# Patient Record
Sex: Female | Born: 1955 | Race: Black or African American | Hispanic: No | Marital: Single | Smoking: Former smoker
Health system: Southern US, Community
[De-identification: ages and names within clinical notes are randomized; demographics above are authoritative.]

## PROBLEM LIST (undated history)

## (undated) ENCOUNTER — Emergency Department (HOSPITAL_COMMUNITY): Payer: Self-pay | Source: Home / Self Care

## (undated) DIAGNOSIS — I639 Cerebral infarction, unspecified: Secondary | ICD-10-CM

## (undated) DIAGNOSIS — E119 Type 2 diabetes mellitus without complications: Secondary | ICD-10-CM

## (undated) DIAGNOSIS — I10 Essential (primary) hypertension: Secondary | ICD-10-CM

## (undated) HISTORY — PX: ABDOMINAL HYSTERECTOMY: SHX81

## (undated) HISTORY — DX: Essential (primary) hypertension: I10

---

## 2010-06-26 ENCOUNTER — Emergency Department: Payer: Self-pay | Admitting: Emergency Medicine

## 2011-08-12 ENCOUNTER — Ambulatory Visit: Payer: Self-pay | Admitting: Family Medicine

## 2011-09-01 ENCOUNTER — Ambulatory Visit: Payer: Self-pay | Admitting: Family Medicine

## 2020-05-17 ENCOUNTER — Emergency Department: Payer: HRSA Program

## 2020-05-17 ENCOUNTER — Encounter: Payer: Self-pay | Admitting: Emergency Medicine

## 2020-05-17 ENCOUNTER — Other Ambulatory Visit: Payer: Self-pay

## 2020-05-17 ENCOUNTER — Inpatient Hospital Stay
Admission: EM | Admit: 2020-05-17 | Discharge: 2020-05-29 | DRG: 177 | Disposition: A | Discharge status: Home Health | Payer: HRSA Program | Attending: Internal Medicine | Admitting: Internal Medicine

## 2020-05-17 DIAGNOSIS — R531 Weakness: Secondary | ICD-10-CM

## 2020-05-17 DIAGNOSIS — H02402 Unspecified ptosis of left eyelid: Secondary | ICD-10-CM

## 2020-05-17 DIAGNOSIS — U071 COVID-19: Secondary | ICD-10-CM | POA: Diagnosis not present

## 2020-05-17 DIAGNOSIS — Z79899 Other long term (current) drug therapy: Secondary | ICD-10-CM

## 2020-05-17 DIAGNOSIS — I5033 Acute on chronic diastolic (congestive) heart failure: Secondary | ICD-10-CM | POA: Diagnosis present

## 2020-05-17 DIAGNOSIS — I671 Cerebral aneurysm, nonruptured: Secondary | ICD-10-CM | POA: Diagnosis present

## 2020-05-17 DIAGNOSIS — Z7401 Bed confinement status: Secondary | ICD-10-CM

## 2020-05-17 DIAGNOSIS — S0303XA Dislocation of jaw, bilateral, initial encounter: Secondary | ICD-10-CM | POA: Diagnosis present

## 2020-05-17 DIAGNOSIS — I63522 Cerebral infarction due to unspecified occlusion or stenosis of left anterior cerebral artery: Secondary | ICD-10-CM | POA: Diagnosis present

## 2020-05-17 DIAGNOSIS — Y92009 Unspecified place in unspecified non-institutional (private) residence as the place of occurrence of the external cause: Secondary | ICD-10-CM

## 2020-05-17 DIAGNOSIS — I10 Essential (primary) hypertension: Secondary | ICD-10-CM

## 2020-05-17 DIAGNOSIS — I639 Cerebral infarction, unspecified: Secondary | ICD-10-CM

## 2020-05-17 DIAGNOSIS — E860 Dehydration: Secondary | ICD-10-CM

## 2020-05-17 DIAGNOSIS — I110 Hypertensive heart disease with heart failure: Secondary | ICD-10-CM | POA: Diagnosis present

## 2020-05-17 DIAGNOSIS — E1165 Type 2 diabetes mellitus with hyperglycemia: Secondary | ICD-10-CM | POA: Diagnosis not present

## 2020-05-17 DIAGNOSIS — J1282 Pneumonia due to coronavirus disease 2019: Secondary | ICD-10-CM | POA: Diagnosis present

## 2020-05-17 DIAGNOSIS — Z87891 Personal history of nicotine dependence: Secondary | ICD-10-CM

## 2020-05-17 DIAGNOSIS — J189 Pneumonia, unspecified organism: Secondary | ICD-10-CM

## 2020-05-17 DIAGNOSIS — R4781 Slurred speech: Secondary | ICD-10-CM

## 2020-05-17 DIAGNOSIS — N179 Acute kidney failure, unspecified: Secondary | ICD-10-CM | POA: Diagnosis present

## 2020-05-17 DIAGNOSIS — R29810 Facial weakness: Secondary | ICD-10-CM | POA: Diagnosis present

## 2020-05-17 DIAGNOSIS — Z23 Encounter for immunization: Secondary | ICD-10-CM

## 2020-05-17 DIAGNOSIS — R29702 NIHSS score 2: Secondary | ICD-10-CM | POA: Diagnosis present

## 2020-05-17 DIAGNOSIS — W1830XA Fall on same level, unspecified, initial encounter: Secondary | ICD-10-CM | POA: Diagnosis present

## 2020-05-17 DIAGNOSIS — Z90710 Acquired absence of both cervix and uterus: Secondary | ICD-10-CM

## 2020-05-17 DIAGNOSIS — J9601 Acute respiratory failure with hypoxia: Secondary | ICD-10-CM

## 2020-05-17 DIAGNOSIS — T380X5A Adverse effect of glucocorticoids and synthetic analogues, initial encounter: Secondary | ICD-10-CM | POA: Diagnosis not present

## 2020-05-17 HISTORY — DX: Type 2 diabetes mellitus without complications: E11.9

## 2020-05-17 HISTORY — DX: Cerebral infarction, unspecified: I63.9

## 2020-05-17 LAB — CBC
HCT: 40.6 % (ref 36.0–46.0)
Hemoglobin: 13.5 g/dL (ref 12.0–15.0)
MCH: 28.7 pg (ref 26.0–34.0)
MCHC: 33.3 g/dL (ref 30.0–36.0)
MCV: 86.2 fL (ref 80.0–100.0)
Platelets: 151 10*3/uL (ref 150–400)
RBC: 4.71 MIL/uL (ref 3.87–5.11)
RDW: 12.9 % (ref 11.5–15.5)
WBC: 5.6 10*3/uL (ref 4.0–10.5)
nRBC: 0 % (ref 0.0–0.2)

## 2020-05-17 LAB — AMMONIA: Ammonia: 11 umol/L (ref 9–35)

## 2020-05-17 LAB — PROTIME-INR
INR: 1 (ref 0.8–1.2)
Prothrombin Time: 12.4 seconds (ref 11.4–15.2)

## 2020-05-17 LAB — TROPONIN I (HIGH SENSITIVITY): Troponin I (High Sensitivity): 24 ng/L — ABNORMAL HIGH (ref <18)

## 2020-05-17 LAB — COMPREHENSIVE METABOLIC PANEL
ALT: 15 U/L (ref 0–44)
AST: 24 U/L (ref 15–41)
Albumin: 3.6 g/dL (ref 3.5–5.0)
Alkaline Phosphatase: 56 U/L (ref 38–126)
Anion gap: 12 (ref 5–15)
BUN: 29 mg/dL — ABNORMAL HIGH (ref 8–23)
CO2: 26 mmol/L (ref 22–32)
Calcium: 10.5 mg/dL — ABNORMAL HIGH (ref 8.9–10.3)
Chloride: 100 mmol/L (ref 98–111)
Creatinine, Ser: 1.52 mg/dL — ABNORMAL HIGH (ref 0.44–1.00)
GFR, Estimated: 38 mL/min — ABNORMAL LOW (ref >60)
Glucose, Bld: 121 mg/dL — ABNORMAL HIGH (ref 70–99)
Potassium: 3.8 mmol/L (ref 3.5–5.1)
Sodium: 138 mmol/L (ref 135–145)
Total Bilirubin: 0.9 mg/dL (ref 0.3–1.2)
Total Protein: 7.4 g/dL (ref 6.5–8.1)

## 2020-05-17 LAB — DIFFERENTIAL
Abs Immature Granulocytes: 0.03 10*3/uL (ref 0.00–0.07)
Basophils Absolute: 0 10*3/uL (ref 0.0–0.1)
Basophils Relative: 0 %
Eosinophils Absolute: 0 10*3/uL (ref 0.0–0.5)
Eosinophils Relative: 0 %
Immature Granulocytes: 1 %
Lymphocytes Relative: 11 %
Lymphs Abs: 0.6 10*3/uL — ABNORMAL LOW (ref 0.7–4.0)
Monocytes Absolute: 0.5 10*3/uL (ref 0.1–1.0)
Monocytes Relative: 10 %
Neutro Abs: 4.4 10*3/uL (ref 1.7–7.7)
Neutrophils Relative %: 78 %

## 2020-05-17 LAB — RESP PANEL BY RT-PCR (FLU A&B, COVID) ARPGX2
Influenza A by PCR: NEGATIVE
Influenza B by PCR: NEGATIVE
SARS Coronavirus 2 by RT PCR: POSITIVE — AB

## 2020-05-17 LAB — BLOOD GAS, VENOUS
Acid-Base Excess: 3.5 mmol/L — ABNORMAL HIGH (ref 0.0–2.0)
Bicarbonate: 30.4 mmol/L — ABNORMAL HIGH (ref 20.0–28.0)
O2 Saturation: 57.8 %
Patient temperature: 37
pCO2, Ven: 55 mmHg (ref 44.0–60.0)
pH, Ven: 7.35 (ref 7.250–7.430)
pO2, Ven: 32 mmHg (ref 32.0–45.0)

## 2020-05-17 LAB — CBG MONITORING, ED: Glucose-Capillary: 127 mg/dL — ABNORMAL HIGH (ref 70–99)

## 2020-05-17 LAB — FIBRIN DERIVATIVES D-DIMER (ARMC ONLY): Fibrin derivatives D-dimer (ARMC): 1290.38 ng/mL (FEU) — ABNORMAL HIGH (ref 0.00–499.00)

## 2020-05-17 LAB — MAGNESIUM: Magnesium: 2.1 mg/dL (ref 1.7–2.4)

## 2020-05-17 LAB — CK: Total CK: 38 U/L (ref 38–234)

## 2020-05-17 LAB — TSH: TSH: 1.259 u[IU]/mL (ref 0.350–4.500)

## 2020-05-17 LAB — APTT: aPTT: 31 seconds (ref 24–36)

## 2020-05-17 LAB — BRAIN NATRIURETIC PEPTIDE: B Natriuretic Peptide: 31 pg/mL (ref 0.0–100.0)

## 2020-05-17 LAB — PROCALCITONIN: Procalcitonin: 0.19 ng/mL

## 2020-05-17 MED ORDER — MIDAZOLAM HCL 2 MG/2ML IJ SOLN: 2.0000 mg | Freq: Once | INTRAMUSCULAR | Status: DC

## 2020-05-17 MED ORDER — SODIUM CHLORIDE 0.9 % IV BOLUS
1000.0000 mL | Freq: Once | INTRAVENOUS | Status: AC
  Administered 2020-05-17: 1000 mL via INTRAVENOUS

## 2020-05-17 MED ORDER — ASPIRIN 81 MG PO CHEW
324.0000 mg | CHEWABLE_TABLET | Freq: Once | ORAL | Status: AC
  Administered 2020-05-17: 324 mg via ORAL
  Filled 2020-05-17: qty 4

## 2020-05-17 MED ORDER — SODIUM CHLORIDE 0.9 % IV SOLN
1.0000 g | Freq: Once | INTRAVENOUS | Status: AC
  Administered 2020-05-17: 1 g via INTRAVENOUS
  Filled 2020-05-17: qty 10

## 2020-05-17 MED ORDER — DEXAMETHASONE SODIUM PHOSPHATE 10 MG/ML IJ SOLN
6.0000 mg | Freq: Once | INTRAMUSCULAR | Status: AC
  Administered 2020-05-17: 6 mg via INTRAVENOUS
  Filled 2020-05-17: qty 1

## 2020-05-17 MED ORDER — LACTATED RINGERS IV BOLUS
1000.0000 mL | Freq: Once | INTRAVENOUS | Status: AC
  Administered 2020-05-17: 1000 mL via INTRAVENOUS

## 2020-05-17 MED ORDER — SODIUM CHLORIDE 0.9 % IV SOLN
500.0000 mg | Freq: Once | INTRAVENOUS | Status: AC
  Administered 2020-05-18: 500 mg via INTRAVENOUS
  Filled 2020-05-17: qty 500

## 2020-05-17 MED ORDER — LACTATED RINGERS IV BOLUS: 1000.0000 mL | Freq: Once | INTRAVENOUS | Status: DC

## 2020-05-17 NOTE — ED Triage Notes (Addendum)
Pt arrived via POV with reports of weakness x 2 weeks, pt states she can't get up a move around.  Pt also reports difficulty with speech and getting words out that started this week. Speech slurred in triage.  Denies any pain at this time.

## 2020-05-17 NOTE — ED Provider Notes (Addendum)
[facility] Emergency Department Provider Note  ____________________________________________   Event Date/Time   First MD Initiated Contact with Patient 05/17/20 2208     (approximate)  I have reviewed the triage vital signs and the nursing notes.   HISTORY  Chief Complaint Weakness   HPI Stephanie [name] is a 65 y.o. female without significant past medical history who presents for assessment of approximately 2 weeks of worsening weakness associated with cough, slurred speech, and some initial diarrhea that has since improved.  Patient denies any injuries or falls.  She states she thinks her slurred speech started a week ago.  She endorses cough but no hemoptysis, chest pain, shortness of breath, abdominal pain, nausea, vomiting, urinary symptoms or diarrhea over the last week.  She denies any focal extremity pain weakness numbness or tingling or any rashes but endorses global weakness stating she now feels too weak to walk.  She denies any EtOH use, illicit drug use, tobacco abuse.  She does not take any daily medications and has not seen a doctor in over a decade.  No clear alleviating or aggravating factors.       History reviewed. No pertinent past medical history.  Patient Active Problem List   Diagnosis Date Noted  . Pneumonia due to COVID-19 virus 05/18/2020  . Dehydration 05/18/2020  . Slurred speech 05/18/2020    History reviewed. No pertinent surgical history.  Prior to Admission medications   Not on File    Allergies Patient has no known allergies.  No family history on file.  Social History Social History   Tobacco Use  . Smoking status: Former Smoker    Quit date: 04/2020    Years since quitting: 0.0  . Smokeless tobacco: Never Used  Vaping Use  . Vaping Use: Never used    Review of Systems  Review of Systems  Constitutional: Positive for malaise/fatigue. Negative for chills and fever.  HENT: Negative for sore throat.    Eyes: Negative for pain.  Respiratory: Positive for cough. Negative for stridor.   Cardiovascular: Negative for chest pain.  Gastrointestinal: Positive for diarrhea ( initially, now improved). Negative for vomiting.  Genitourinary: Negative for dysuria.  Musculoskeletal: Negative for myalgias.  Skin: Negative for rash.  Neurological: Positive for weakness. Negative for seizures, loss of consciousness and headaches.  Psychiatric/Behavioral: Negative for suicidal ideas.  All other systems reviewed and are negative.     ____________________________________________   PHYSICAL EXAM:  VITAL SIGNS: ED Triage Vitals  Enc Vitals Group     BP 05/17/20 2141 (!) 143/92     Pulse Rate 05/17/20 2141 90     Resp 05/17/20 2141 16     Temp 05/17/20 2141 98.7 F (37.1 C)     Temp Source 05/17/20 2141 Oral     SpO2 05/17/20 2141 100 %     Weight 05/17/20 2141 145 lb (65.8 kg)     Height 05/17/20 2141 6' (1.829 m)     Head Circumference --      Peak Flow --      Pain Score 05/17/20 2140 0     Pain Loc --      Pain Edu? --      Excl. in GC? --    Vitals:   05/17/20 2330 05/18/20 0000  BP: (!) 153/88 (!) 177/104  Pulse: 79 80  Resp: 19 (!) 26  Temp:    SpO2: 96% 92%   Physical Exam Vitals and nursing note reviewed.  Constitutional:      General: She is not in acute distress.    Appearance: She is well-developed and well-nourished.  HENT:     Head: Normocephalic and atraumatic.     Right Ear: External ear normal.     Left Ear: External ear normal.     Nose: Nose normal.     Mouth/Throat:     Mouth: Mucous membranes are dry.  Eyes:     Conjunctiva/sclera: Conjunctivae normal.  Cardiovascular:     Rate and Rhythm: Normal rate and regular rhythm.     Pulses: Normal pulses.     Heart sounds: No murmur heard.   Pulmonary:     Effort: Pulmonary effort is normal. No respiratory distress.     Breath sounds: Examination of the right-upper field reveals rhonchi. Examination of the  left-upper field reveals rhonchi. Examination of the right-middle field reveals rhonchi. Examination of the left-middle field reveals rhonchi. Examination of the right-lower field reveals rhonchi. Examination of the left-lower field reveals rhonchi. Rhonchi present.  Abdominal:     Palpations: Abdomen is soft.     Tenderness: There is no abdominal tenderness.  Musculoskeletal:        General: No edema.     Cervical back: Neck supple.  Skin:    General: Skin is warm and dry.     Capillary Refill: Capillary refill takes 2 to 3 seconds.  Neurological:     Mental Status: She is alert.  Psychiatric:        Mood and Affect: Mood and affect and mood normal.        Speech: Speech is slurred.        Thought Content: Thought content does not include homicidal or suicidal ideation.     Patient has symmetric strength in all extremities although she is extremely weak in all extremities.  2+ bilateral radial pulses.  Sensation intact light touch of all extremities.  Patient speech is slurred and she has some very subtle left-sided ptosis otherwise cranial nerves II through XII grossly intact.  No finger dysmetria.  No pronator drift.  ____________________________________________   LABS (all labs ordered are listed, but only abnormal results are displayed)  Labs Reviewed  RESP PANEL BY RT-PCR (FLU A&B, COVID) ARPGX2 - Abnormal; Notable for the following components:      Result Value   SARS Coronavirus 2 by RT PCR POSITIVE (*)    All other components within normal limits  DIFFERENTIAL - Abnormal; Notable for the following components:   Lymphs Abs 0.6 (*)    All other components within normal limits  COMPREHENSIVE METABOLIC PANEL - Abnormal; Notable for the following components:   Glucose, Bld 121 (*)    BUN 29 (*)    Creatinine, Ser 1.52 (*)    Calcium 10.5 (*)    GFR, Estimated 38 (*)    All other components within normal limits  BLOOD GAS, VENOUS - Abnormal; Notable for the following  components:   Bicarbonate 30.4 (*)    Acid-Base Excess 3.5 (*)    All other components within normal limits  FIBRIN DERIVATIVES D-DIMER ([facility] ONLY) - Abnormal; Notable for the following components:   Fibrin derivatives D-dimer ([facility]) 1,290.38 (*)    All other components within normal limits  CBG MONITORING, ED - Abnormal; Notable for the following components:   Glucose-Capillary 127 (*)    All other components within normal limits  TROPONIN I (HIGH SENSITIVITY) - Abnormal; Notable for the following components:   Troponin I (  High Sensitivity) 24 (*)    All other components within normal limits  CULTURE, BLOOD (ROUTINE X 2)  CULTURE, BLOOD (ROUTINE X 2)  PROTIME-INR  APTT  CBC  AMMONIA  TSH  PROCALCITONIN  CK  MAGNESIUM  BRAIN NATRIURETIC PEPTIDE  URINALYSIS, COMPLETE (UACMP) WITH MICROSCOPIC  ETHANOL  HIV ANTIBODY (ROUTINE TESTING W REFLEX)  C-REACTIVE PROTEIN  FERRITIN  LACTATE DEHYDROGENASE  TROPONIN I (HIGH SENSITIVITY)   ____________________________________________  EKG  Sinus rhythm with a ventricular to 91, normal axis, unremarkable intervals, and nonspecific changes in 1 and aVL, and T wave inversions and ST depressions in V5, and V6 as well as some nonspecific elevation in V1 and some nonspecific changes in inferior leads. ____________________________________________  RADIOLOGY  ED MD interpretation: Bilateral multifocal opacities consistent with pneumonia.  No large effusion, overt edema, pneumothorax or other acute thoracic process.   Official radiology report(s): DG Chest 1 View  Result Date: 05/17/2020 CLINICAL DATA:  Weakness for 2 weeks. Difficulty with speech starting this week. EXAM: CHEST  1 VIEW COMPARISON:  06/26/2010 FINDINGS: Borderline heart size. Diffuse peribronchial thickening with streaky perihilar opacities and patchy areas of consolidation suggested in the right middle and both lower lungs. Changes may be due to edema or multifocal pneumonia. No  pleural effusions. No pneumothorax. Mediastinal contours appear intact. Tortuous aorta. IMPRESSION: Peribronchial thickening with streaky perihilar opacities and patchy areas of consolidation in the right middle and both lower lungs. Changes may be due to edema or multifocal pneumonia. Electronically Signed   By: Burman Nieves M.D.   On: 05/17/2020 22:32   CT HEAD WO CONTRAST  Result Date: 05/17/2020 CLINICAL DATA:  Weakness for 2 weeks EXAM: CT HEAD WITHOUT CONTRAST TECHNIQUE: Contiguous axial images were obtained from the base of the skull through the vertex without intravenous contrast. COMPARISON:  None. FINDINGS: Brain: No evidence of acute territorial infarction, hemorrhage, hydrocephalus,extra-axial collection or mass lesion/mass effect. There is dilatation the ventricles and sulci consistent with age-related atrophy. Low-attenuation changes in the deep white matter consistent with small vessel ischemia. Vascular: No hyperdense vessel or unexpected calcification. Skull: The skull is intact. No fracture or focal lesion identified. Sinuses/Orbits: The visualized paranasal sinuses and mastoid air cells are clear. The orbits and globes intact. Other: None IMPRESSION: No acute intracranial abnormality. Findings consistent with age related atrophy and chronic small vessel ischemia Electronically Signed   By: Jonna Clark M.D.   On: 05/17/2020 22:57    ____________________________________________   PROCEDURES  Procedure(s) performed (including Critical Care):  .1-3 Lead EKG Interpretation Performed by: Gilles Chiquito, MD Authorized by: Gilles Chiquito, MD     Interpretation: normal     ECG rate assessment: normal     Rhythm: sinus rhythm     Ectopy: none     Conduction: normal    .Critical Care Performed by: Gilles Chiquito, MD Authorized by: Gilles Chiquito, MD   Critical care provider statement:    Critical care time (minutes):  45   Critical care time was exclusive of:   Separately billable procedures and treating other patients   Critical care was necessary to treat or prevent imminent or life-threatening deterioration of the following conditions:  Respiratory failure   Critical care was time spent personally by me on the following activities:  Discussions with consultants, evaluation of patient's response to treatment, examination of patient, ordering and performing treatments and interventions, ordering and review of laboratory studies, ordering and review of radiographic studies, pulse oximetry, re-evaluation of  patient's condition, obtaining history from patient or surrogate and review of old charts   ____________________________________________   INITIAL IMPRESSION / ASSESSMENT AND PLAN / ED COURSE      Patient presents with above-stated history exam for assessment worsening global weakness, poor appetite, cough, and some initial diarrhea associate with her symptoms that has since improved.  She also states she developed some slurred speech about a week ago and states she is now so weak she cannot get up to get around her home.  On arrival patient is afebrile hemodynamically stable although hypoxic on room air to the mid 80s which improved to mid 90s on 3 L nasal cannula.  Patient has rhonchi on exam and chest x-ray concerning for multifocal pneumonia.  I suspect this is etiology for cough and hypoxic respiratory failure.  No evidence of acute volume overload on history or exam and overall patient appears quite dry on exam.  CBC has no leukocytosis or acute anemia.  CMP remarkable for BUN of 29, creatinine of 1.52 and no other significant electrolyte or metabolic derangements.  No evidence of hepatitis or cholestasis.  No prior recent kidney function to compare to.  Concern for kidney injury in the setting of dehydration and pneumonia.  Initial troponin is elevated at 24 and patient has some ischemic changes on her ECG suspect these are demand related given the  degree of troponin elevation and lower suspicion for acute coronary thrombosis although we will plan to obtain repeat troponin and give patient aspirin in the meantime.  VBG has no evidence of acute hypercarbic respiratory failure.  Iwill plan to obtain D-dimer to assess for evidence of this for PE.  Magnesium and CK are unremarkable.  No evidence of myositis.  BNP is 31 and overall presentation is not consistent with heart failure or acute volume overload.  given hypoxic respite failure and concern for pneumonia blood cultures were obtained and patient was given antibiotics to cover for commune acquired pneumonia.  With regard to patient's slurred speech concern for possible acute subacute CVA.  Very low suspicion for toxic ingestion and presentation is not consistent with meningitis or encephalitis.  While CT does not show evidence of CVA or acute intracranial hemorrhage or other acute process we will plan to brain MR to assess for ischemia.  I will plan to admit to hospitalist service for further evaluation management.    ____________________________________________   FINAL CLINICAL IMPRESSION(S) / ED DIAGNOSES  Final diagnoses:  Weakness  Pneumonia of both lungs due to infectious organism, unspecified part of lung  Dehydration  Slurred speech  Ptosis of left eyelid  Acute respiratory failure with hypoxia (HCC)  COVID    Medications  azithromycin (ZITHROMAX) 500 mg in sodium chloride 0.9 % 250 mL IVPB (has no administration in time range)  enoxaparin (LOVENOX) injection 40 mg (has no administration in time range)  dextrose 5 %-0.45 % sodium chloride infusion (has no administration in time range)  remdesivir 200 mg in sodium chloride 0.9% 250 mL IVPB (has no administration in time range)    Followed by  remdesivir 100 mg in sodium chloride 0.9 % 100 mL IVPB (has no administration in time range)  methylPREDNISolone sodium succinate (SOLU-MEDROL) 40 mg/mL injection 32.8 mg (has no  administration in time range)    Followed by  predniSONE (DELTASONE) tablet 50 mg (has no administration in time range)  Ipratropium-Albuterol (COMBIVENT) respimat 1 puff (has no administration in time range)  guaiFENesin-dextromethorphan (ROBITUSSIN DM) 100-10 MG/5ML syrup 10 mL (  has no administration in time range)  chlorpheniramine-HYDROcodone (TUSSIONEX) 10-8 MG/5ML suspension 5 mL (has no administration in time range)  insulin detemir (LEVEMIR) injection 5 Units (has no administration in time range)  insulin aspart (novoLOG) injection 0-15 Units (has no administration in time range)  acetaminophen (TYLENOL) tablet 650 mg (has no administration in time range)  traZODone (DESYREL) tablet 25 mg (has no administration in time range)  senna (SENOKOT) tablet 8.6 mg (has no administration in time range)  aspirin chewable tablet 324 mg (324 mg Oral Given 05/17/20 2345)  cefTRIAXone (ROCEPHIN) 1 g in sodium chloride 0.9 % 100 mL IVPB (0 g Intravenous Stopped 05/18/20 0022)  lactated ringers bolus 1,000 mL (0 mLs Intravenous Stopped 05/17/20 2349)  sodium chloride 0.9 % bolus 1,000 mL (1,000 mLs Intravenous New Bag/Given 05/17/20 2352)  dexamethasone (DECADRON) injection 6 mg (6 mg Intravenous Given 05/17/20 2359)     ED Discharge Orders    None       Note:  This document was prepared using Dragon voice recognition software and may include unintentional dictation errors.   Lucrezia Starch, MD 05/17/20 2346    Lucrezia Starch, MD 05/18/20 [phone]

## 2020-05-18 ENCOUNTER — Inpatient Hospital Stay: Payer: HRSA Program

## 2020-05-18 ENCOUNTER — Emergency Department: Payer: HRSA Program

## 2020-05-18 DIAGNOSIS — I5033 Acute on chronic diastolic (congestive) heart failure: Secondary | ICD-10-CM | POA: Diagnosis present

## 2020-05-18 DIAGNOSIS — J1282 Pneumonia due to coronavirus disease 2019: Secondary | ICD-10-CM | POA: Diagnosis not present

## 2020-05-18 DIAGNOSIS — E1165 Type 2 diabetes mellitus with hyperglycemia: Secondary | ICD-10-CM | POA: Diagnosis not present

## 2020-05-18 DIAGNOSIS — Z79899 Other long term (current) drug therapy: Secondary | ICD-10-CM | POA: Diagnosis not present

## 2020-05-18 DIAGNOSIS — I110 Hypertensive heart disease with heart failure: Secondary | ICD-10-CM | POA: Diagnosis present

## 2020-05-18 DIAGNOSIS — R29810 Facial weakness: Secondary | ICD-10-CM | POA: Diagnosis present

## 2020-05-18 DIAGNOSIS — I671 Cerebral aneurysm, nonruptured: Secondary | ICD-10-CM | POA: Diagnosis present

## 2020-05-18 DIAGNOSIS — Z7401 Bed confinement status: Secondary | ICD-10-CM | POA: Diagnosis not present

## 2020-05-18 DIAGNOSIS — J9601 Acute respiratory failure with hypoxia: Secondary | ICD-10-CM | POA: Diagnosis not present

## 2020-05-18 DIAGNOSIS — U071 COVID-19: Principal | ICD-10-CM

## 2020-05-18 DIAGNOSIS — N179 Acute kidney failure, unspecified: Secondary | ICD-10-CM | POA: Diagnosis present

## 2020-05-18 DIAGNOSIS — W1830XA Fall on same level, unspecified, initial encounter: Secondary | ICD-10-CM | POA: Diagnosis present

## 2020-05-18 DIAGNOSIS — Z23 Encounter for immunization: Secondary | ICD-10-CM | POA: Diagnosis not present

## 2020-05-18 DIAGNOSIS — T380X5A Adverse effect of glucocorticoids and synthetic analogues, initial encounter: Secondary | ICD-10-CM | POA: Diagnosis not present

## 2020-05-18 DIAGNOSIS — I63522 Cerebral infarction due to unspecified occlusion or stenosis of left anterior cerebral artery: Secondary | ICD-10-CM

## 2020-05-18 DIAGNOSIS — Y92009 Unspecified place in unspecified non-institutional (private) residence as the place of occurrence of the external cause: Secondary | ICD-10-CM | POA: Diagnosis not present

## 2020-05-18 DIAGNOSIS — S0303XA Dislocation of jaw, bilateral, initial encounter: Secondary | ICD-10-CM | POA: Diagnosis present

## 2020-05-18 DIAGNOSIS — R29702 NIHSS score 2: Secondary | ICD-10-CM | POA: Diagnosis present

## 2020-05-18 DIAGNOSIS — R531 Weakness: Secondary | ICD-10-CM | POA: Diagnosis not present

## 2020-05-18 DIAGNOSIS — I639 Cerebral infarction, unspecified: Secondary | ICD-10-CM | POA: Diagnosis not present

## 2020-05-18 DIAGNOSIS — Z87891 Personal history of nicotine dependence: Secondary | ICD-10-CM | POA: Diagnosis not present

## 2020-05-18 DIAGNOSIS — E860 Dehydration: Secondary | ICD-10-CM | POA: Diagnosis not present

## 2020-05-18 DIAGNOSIS — R4781 Slurred speech: Secondary | ICD-10-CM

## 2020-05-18 DIAGNOSIS — I10 Essential (primary) hypertension: Secondary | ICD-10-CM | POA: Diagnosis not present

## 2020-05-18 DIAGNOSIS — Z90710 Acquired absence of both cervix and uterus: Secondary | ICD-10-CM | POA: Diagnosis not present

## 2020-05-18 LAB — URINALYSIS, COMPLETE (UACMP) WITH MICROSCOPIC
Bacteria, UA: NONE SEEN
Bilirubin Urine: NEGATIVE
Glucose, UA: NEGATIVE mg/dL
Hgb urine dipstick: NEGATIVE
Ketones, ur: NEGATIVE mg/dL
Leukocytes,Ua: NEGATIVE
Nitrite: NEGATIVE
Protein, ur: 30 mg/dL — AB
Specific Gravity, Urine: 1.015 (ref 1.005–1.030)
pH: 5 (ref 5.0–8.0)

## 2020-05-18 LAB — URINE DRUG SCREEN, QUALITATIVE (ARMC ONLY)
Amphetamines, Ur Screen: NOT DETECTED
Barbiturates, Ur Screen: NOT DETECTED
Benzodiazepine, Ur Scrn: NOT DETECTED
Cannabinoid 50 Ng, Ur ~~LOC~~: NOT DETECTED
Cocaine Metabolite,Ur ~~LOC~~: NOT DETECTED
MDMA (Ecstasy)Ur Screen: NOT DETECTED
Methadone Scn, Ur: NOT DETECTED
Opiate, Ur Screen: NOT DETECTED
Phencyclidine (PCP) Ur S: NOT DETECTED
Tricyclic, Ur Screen: NOT DETECTED

## 2020-05-18 LAB — FERRITIN: Ferritin: 545 ng/mL — ABNORMAL HIGH (ref 11–307)

## 2020-05-18 LAB — CBG MONITORING, ED
Glucose-Capillary: 122 mg/dL — ABNORMAL HIGH (ref 70–99)
Glucose-Capillary: 134 mg/dL — ABNORMAL HIGH (ref 70–99)
Glucose-Capillary: 141 mg/dL — ABNORMAL HIGH (ref 70–99)
Glucose-Capillary: 160 mg/dL — ABNORMAL HIGH (ref 70–99)
Glucose-Capillary: 53 mg/dL — ABNORMAL LOW (ref 70–99)

## 2020-05-18 LAB — LIPID PANEL
Cholesterol: 126 mg/dL (ref 0–200)
HDL: 48 mg/dL (ref >40)
LDL Cholesterol: 59 mg/dL (ref 0–99)
Total CHOL/HDL Ratio: 2.6 RATIO
Triglycerides: 95 mg/dL (ref <150)
VLDL: 19 mg/dL (ref 0–40)

## 2020-05-18 LAB — HIV ANTIBODY (ROUTINE TESTING W REFLEX): HIV Screen 4th Generation wRfx: NONREACTIVE

## 2020-05-18 LAB — ETHANOL: Alcohol, Ethyl (B): <10 mg/dL (ref <10)

## 2020-05-18 LAB — LACTATE DEHYDROGENASE: LDH: 229 U/L — ABNORMAL HIGH (ref 98–192)

## 2020-05-18 LAB — HEMOGLOBIN A1C
Hgb A1c MFr Bld: 5.4 % (ref 4.8–5.6)
Mean Plasma Glucose: 108.28 mg/dL

## 2020-05-18 LAB — C-REACTIVE PROTEIN: CRP: 7.8 mg/dL — ABNORMAL HIGH (ref <1.0)

## 2020-05-18 LAB — TROPONIN I (HIGH SENSITIVITY): Troponin I (High Sensitivity): 27 ng/L — ABNORMAL HIGH (ref <18)

## 2020-05-18 MED ORDER — METHYLPREDNISOLONE SODIUM SUCC 40 MG IJ SOLR
0.5000 mg/kg | Freq: Two times a day (BID) | INTRAMUSCULAR | Status: DC
Start: 2020-05-18 — End: 2020-05-18
  Filled 2020-05-18: qty 1

## 2020-05-18 MED ORDER — PREDNISONE 20 MG PO TABS: 50.0000 mg | ORAL_TABLET | Freq: Every day | ORAL | Status: DC

## 2020-05-18 MED ORDER — SODIUM CHLORIDE 0.9 % IV SOLN
100.0000 mg | Freq: Every day | INTRAVENOUS | Status: AC
  Administered 2020-05-18 – 2020-05-21 (×3): 100 mg via INTRAVENOUS
  Filled 2020-05-18 (×2): qty 100
  Filled 2020-05-18: qty 20
  Filled 2020-05-18: qty 100

## 2020-05-18 MED ORDER — HYDROCOD POLST-CPM POLST ER 10-8 MG/5ML PO SUER: 5.0000 mL | Freq: Two times a day (BID) | ORAL | Status: DC | PRN

## 2020-05-18 MED ORDER — INSULIN DETEMIR 100 UNIT/ML ~~LOC~~ SOLN
0.0750 [IU]/kg | Freq: Two times a day (BID) | SUBCUTANEOUS | Status: DC
  Administered 2020-05-18 – 2020-05-28 (×21): 5 [IU] via SUBCUTANEOUS
  Filled 2020-05-18 (×22): qty 0.05

## 2020-05-18 MED ORDER — HYDRALAZINE HCL 20 MG/ML IJ SOLN
10.0000 mg | Freq: Four times a day (QID) | INTRAMUSCULAR | Status: DC | PRN
  Administered 2020-05-18 – 2020-05-22 (×4): 10 mg via INTRAVENOUS
  Filled 2020-05-18 (×4): qty 1

## 2020-05-18 MED ORDER — SODIUM CHLORIDE 0.9 % IV SOLN
200.0000 mg | Freq: Once | INTRAVENOUS | Status: AC
  Administered 2020-05-18: 200 mg via INTRAVENOUS
  Filled 2020-05-18: qty 40

## 2020-05-18 MED ORDER — SENNA 8.6 MG PO TABS
1.0000 | ORAL_TABLET | Freq: Two times a day (BID) | ORAL | Status: DC
  Administered 2020-05-18 – 2020-05-29 (×22): 8.6 mg via ORAL
  Filled 2020-05-18 (×21): qty 1

## 2020-05-18 MED ORDER — TRAZODONE HCL 50 MG PO TABS
25.0000 mg | ORAL_TABLET | Freq: Every evening | ORAL | Status: DC | PRN
  Administered 2020-05-21 – 2020-05-27 (×2): 25 mg via ORAL
  Filled 2020-05-18 (×2): qty 1

## 2020-05-18 MED ORDER — LORAZEPAM 2 MG/ML IJ SOLN
1.0000 mg | Freq: Once | INTRAMUSCULAR | Status: AC
  Administered 2020-05-18: 1 mg via INTRAVENOUS
  Filled 2020-05-18: qty 1

## 2020-05-18 MED ORDER — INSULIN ASPART 100 UNIT/ML ~~LOC~~ SOLN
0.0000 [IU] | Freq: Three times a day (TID) | SUBCUTANEOUS | Status: DC
  Administered 2020-05-18: 3 [IU] via SUBCUTANEOUS
  Administered 2020-05-19 – 2020-05-27 (×11): 2 [IU] via SUBCUTANEOUS
  Filled 2020-05-18 (×12): qty 1

## 2020-05-18 MED ORDER — ACETAMINOPHEN 325 MG PO TABS
650.0000 mg | ORAL_TABLET | Freq: Four times a day (QID) | ORAL | Status: DC | PRN
  Administered 2020-05-20 – 2020-05-27 (×4): 650 mg via ORAL
  Filled 2020-05-18 (×4): qty 2

## 2020-05-18 MED ORDER — IPRATROPIUM-ALBUTEROL 20-100 MCG/ACT IN AERS
1.0000 | INHALATION_SPRAY | Freq: Four times a day (QID) | RESPIRATORY_TRACT | Status: DC
  Administered 2020-05-18 – 2020-05-29 (×45): 1 via RESPIRATORY_TRACT
  Filled 2020-05-18 (×2): qty 4

## 2020-05-18 MED ORDER — ENOXAPARIN SODIUM 40 MG/0.4ML ~~LOC~~ SOLN
40.0000 mg | SUBCUTANEOUS | Status: DC
  Administered 2020-05-18: 40 mg via SUBCUTANEOUS
  Filled 2020-05-18: qty 0.4

## 2020-05-18 MED ORDER — DEXTROSE-NACL 5-0.45 % IV SOLN: INTRAVENOUS | Status: DC

## 2020-05-18 MED ORDER — CLOPIDOGREL BISULFATE 75 MG PO TABS
300.0000 mg | ORAL_TABLET | Freq: Once | ORAL | Status: AC
  Administered 2020-05-18: 300 mg via ORAL

## 2020-05-18 MED ORDER — GADOBUTROL 1 MMOL/ML IV SOLN
5.0000 mL | Freq: Once | INTRAVENOUS | Status: AC | PRN
  Administered 2020-05-18: 5 mL via INTRAVENOUS

## 2020-05-18 MED ORDER — CLOPIDOGREL BISULFATE 75 MG PO TABS
75.0000 mg | ORAL_TABLET | Freq: Every day | ORAL | Status: DC
  Administered 2020-05-19 – 2020-05-29 (×11): 75 mg via ORAL
  Filled 2020-05-18 (×11): qty 1

## 2020-05-18 MED ORDER — GUAIFENESIN-DM 100-10 MG/5ML PO SYRP
10.0000 mL | ORAL_SOLUTION | ORAL | Status: DC | PRN
Start: 2020-05-18 — End: 2020-05-29
  Administered 2020-05-18 – 2020-05-21 (×2): 10 mL via ORAL
  Filled 2020-05-18 (×2): qty 10

## 2020-05-18 MED ORDER — METHYLPREDNISOLONE SODIUM SUCC 40 MG IJ SOLR
0.5000 mg/kg | Freq: Two times a day (BID) | INTRAMUSCULAR | Status: DC
  Administered 2020-05-18 – 2020-05-20 (×5): 32.8 mg via INTRAVENOUS
  Filled 2020-05-18 (×4): qty 1

## 2020-05-18 MED ORDER — ASPIRIN EC 81 MG PO TBEC
81.0000 mg | DELAYED_RELEASE_TABLET | Freq: Every day | ORAL | Status: DC
  Administered 2020-05-18 – 2020-05-29 (×12): 81 mg via ORAL
  Filled 2020-05-18 (×11): qty 1

## 2020-05-18 NOTE — ED Notes (Signed)
As per provider, goal systolic BP is 160-180.

## 2020-05-18 NOTE — ED Notes (Signed)
Hospitalist messaged:  this pt is being admitted for covid and a stroke. I just took over for this pt and the current BP is 209/114. I know it has been hypertensive while she has been here. I was wondering if we are wanting to treat the BP. I was wondering also what the plan for the stroke was going to be? I saw that her NIH has expired, I did do a neuro assessment and the previous RN stated it was unchanged on his 12 hour shift."

## 2020-05-18 NOTE — Consult Note (Signed)
Neurology Consultation Reason for Consult: Stroke on MRI Requesting Physician: Marrion Coy  CC: 2 weeks of weakness, 1 week of difficulty with speech and getting words out  History is obtained from: Patient and chart review   HPI: Stephanie [name] is a 65 y.o. female with a past medical history of limited engagement with medical care, tobacco use (quit late 2021) unvaccinated for COVID-19, presenting with COVID-19 pneumonia   Regarding her hospital course thus far, she was given aspirin 324 mg in the ED for her elevated troponin. She has been treated with steroids, Combivent inhaler, guaifenesin-dextromethorphan, and received a dose of ceftriaxone and azithromycin, 2 L of lactated Ringer's and 1 L of normal saline, remdesivir  She reports that about 3 weeks ago she started to have some diarrhea which resolved.  She has had reduced appetite but does not know if she has lost her sense of smell.  She thinks she may have been having fevers.  She has been having exertional weakness, dry cough, but denies palpitations, swelling, rashes, headache, vision changes, hearing loss, any focal weakness, any numbness.  She does additionally have some lower back pain in the last 1 to 2 weeks.  She reports she has been bedbound due to have that she has been feeling for at least 2 or 3 weeks.  There was an initial complaint of slurred speech but this has improved  She reports she did not get the COVID-19 vaccine because she was too busy caring for her mother, who recently passed away, and grieving the death of her aunt and cousin (family history as below)  LKW: 1 week ago  tPA given?: No, due to out of the window   Premorbid modified rankin scale:      0 - No symptoms.  ROS: A 14 point ROS was performed and is negative except as noted in the HPI.   History reviewed. No pertinent past medical history. due to lack of medical care However patient likely has undiagnosed medical conditions including  hypertension  No family history on file. Patient reports that her mother recently passed away from congestive heart failure, that one of her cousins had cancer (she thinks it was colon cancer) and that her aunt passed away from dementia in the past year.  She is not aware of any other health problems that run in the family.  Social History:  reports that she quit smoking about 4 weeks ago. She has never used smokeless tobacco. No history on file for alcohol use and drug use. She denies any other substance use including alcohol use.  Exam: Current vital signs: BP (!) 151/83 (BP Location: Left Arm)   Pulse 70   Temp (!) 97.5 F (36.4 C) (Oral)   Resp 18   Ht 6' (1.829 m)   Wt 65.8 kg   SpO2 97%   BMI 19.67 kg/m  Vital signs in last 24 hours: Temp:  [97.5 F (36.4 C)-98.7 F (37.1 C)] 97.5 F (36.4 C) (01/02 0724) Pulse Rate:  [64-90] 70 (01/02 0724) Resp:  [11-26] 18 (01/02 0724) BP: (142-177)/(83-104) 151/83 (01/02 0724) SpO2:  [92 %-100 %] 97 % (01/02 0724) Weight:  [65.8 kg] 65.8 kg (01/01 2141)   Physical Exam  Constitutional: Appears tired Psych: Affect appropriate to situation, pleasant and cooperative Eyes: No scleral injection HENT: No OP obstruction, good dentition, cannot clearly see the uvula MSK: no joint deformities.  Cardiovascular: Normal rate and regular rhythm.  Respiratory: Effort normal, non-labored breathing GI: Soft.  No distension. There is no tenderness.  Skin: WDI  Neuro: Mental Status: Patient is awake, alert, oriented to person, place, month, year, and situation. Patient is able to give a clear and coherent history. No signs of aphasia or neglect Cranial Nerves: II: Visual Fields are full. Pupils are equal, round, and reactive to light.  3 to 1 mm III,IV, VI: EOMI without ptosis or diploplia.  V: Facial sensation is symmetric to temperature VII: Facial movement is symmetric, no airleak with pursed lips and cheeks full of air VIII: hearing  is intact to voice XI: Shoulder shrug is symmetric. XII: tongue is midline without atrophy or fasciculations.  Motor: Tone is normal. Bulk is normal. 5/5 strength was present in all four extremities other than 4/5 hip flexion bilaterally and 4+/5 left knee flexion (which is limited secondary to cramping pain in the calf) Sensory: Sensation is symmetric to light touch and temperature in the arms and legs without temperature dependent loss of sensation Deep Tendon Reflexes: 3+ and symmetric in the biceps, brachioradialis and patellae, with a positive jaw jerk.  No ankle clonus though she does not relax her ankles well enough to reliably obtain an ankle jerk. Plantars: Toes are downgoing bilaterally.  Cerebellar: FNF and HKS are intact bilaterally Gait not tested given significant dyspnea on exertion and hip flexor weakness  NIHSS total 2 Score breakdown: 2 points for mild drift of bilateral lower extremities  I have reviewed labs in epic and the results pertinent to this consultation are: Creatinine of 1.5 on admission (baseline unknown) BUN 29 on admission Troponin 24 on admission, essentially stable at 27 on repeat SARS-CoV-2 PCR positive D-dimer 1290  CRP 7.8 CK 229  LDH 27 Ferritin 545 Ethanol undetectable (not checked until multiple hours into admission) UA negative for infection HIV negative  TSH 1.259 Lab Results  Component Value Date   CHOL 126 05/18/2020   HDL 48 05/18/2020   LDLCALC 59 05/18/2020   TRIG 95 05/18/2020   CHOLHDL 2.6 05/18/2020    I have reviewed the images obtained:  MRI brain personally reviewed:  Small acute infarct as pictured below Moderate to severe T2/FLAIR white matter changes most c/w chronic microvascular disease Small microhemmorhage in the right medial pons (second image), favored to be chronic per radiology       Impression: This is a 65 year old woman with past medical history significant for heavy tobacco use (recently quit),  likely hypertension, and active COVID-19 infection.  Suspect her generalized weakness is secondary to deconditioning/hypoxia from her pneumonia.  While she had some mild left leg knee flexion weakness, this does not correlate with her stroke and was more likely related to cramping pain.  Her bilateral hip flexion weakness is most likely due to deconditioning.  Stroke etiology is likely uncontrolled small vessel risk factors, but will obtain work-up as below to optimize her medically.  Hypercoagulability in the setting of COVID-19 is also possibility, but data do not support full anticoagulation in the setting at this time.  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 setting of hypercoagulability from acute inflammatory state of COVID-19 in the background of undiagnosed small vessel risk factors  - Stroke labs TSH normal as above, CRP elevated as above, lipid profile is excellent as above  -Pending: RPR, HgbA1c, - MRI brain completed as above - MRA of the brain without contrast and MRA neck w/wo  ordered; patient willing to attempt this examination with Ativan on board, if that is unsuccessful suggest carotid Dopplers instead - Frequent neuro checks, every 4 hours - Echocardiogram ordered  - Aspirin 81 mg daily if no indication for anticoagulation  - Plavix 300 mg load with 75 mg daily for 21 day course if not using AC  - Risk factor modification, diet, exercise, continued smoking cessation, medical care follow-up  - Telemetry monitoring; 30 day event monitor on discharge if no arrythmias captured  - Blood pressure goal pending vessel imaging  -Gradual normalization of blood pressure over several days given likely chronically uncontrolled - PT consult, OT consult, Speech consult - Neurology to follow for echocardiogram and vessel imaging  Brooke Dare MD-PhD [facility] [phone]

## 2020-05-18 NOTE — ED Notes (Signed)
Pt O2 sats 96-97% on 4L, titrated down to 3L .  Pt tolerating well.  O2 sats remaining 94%.

## 2020-05-18 NOTE — ED Notes (Signed)
Patient transported to MRI 

## 2020-05-18 NOTE — ED Notes (Signed)
RN took Engineer, building services role. Pt noted to be on 5lpm via [place] of oxygen. Pt has pulse ox, bp and cardiac monitor on. Pt resting in room with purewick in place. Pt noted to have slurred speech and asking for phone. Previous RN stated pt has been like this all day and looked for phone, but did not find it. RN looked for one and was unable to find one either. Pt was able to tell RN that she is coming in for a stroke and for covid-19.

## 2020-05-18 NOTE — H&P (Signed)
History and Physical    Stephanie [name] GLO:[id number] DOB: 20-Jul-1955 DOA: 05/17/2020  PCP: Pcp, No (Confirm with patient/family/NH records and if not entered, this has to be entered at [facility] point of entry) Patient coming from: home  I have personally briefly reviewed patient's old medical records in [place]  Chief Complaint: progressive weakness and SOB  HPI: Stephanie [name] is a 65 y.o. female with no significant medical history except for hysterectomy. She reports that she has felt ill for about 3 weeks. Initially she had diarrhea which did clear. However, she has had progressive weakness to the point where it is difficult to manage ADLs and she has had progressive SOB with a non-productive cough. Due to her symptoms she presents to [facility] for evaluation.  ED Course: T98.7  143/92  HR 90  RR 16 BM! 20. Exam by EDP notable for diffuse rhonchi. Possible mild facial droop. Lab revealed Glucose 121, WBC 5.6, Hgb 13.5 diff 78/11/10, TSH 1.29, BNP 31, Trop 24, procalcitonin 0.19. CXR with peribronchial thickening, patchy infiltrates RML, RLL, LLL. CT head negative except for age related atrophy. EKG - sinus with ischemic changes. Covd POSITIVE, fibrin derviative 1,290. In ED patient was given abx for possible CAP. TRH called to admit for further evaluation and treatment.   Review of Systems: As per HPI otherwise 10 point review of systems negative.    History reviewed. No pertinent past medical history.  History reviewed. No pertinent surgical history.  Soc Hx -  Never married. No children. She shares a house with her brothers. 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     reports that she quit smoking about 4 weeks ago. She has never used smokeless tobacco. No history on file for alcohol use and drug use.  No Known Allergies  No family history on file.   Prior to Admission medications   Not on File    Physical Exam: Vitals:   05/17/20 2141 05/17/20 2300 05/17/20 2330  05/18/20 0000  BP: (!) 143/92 (!) 158/96 (!) 153/88 (!) 177/104  Pulse: 90 77 79 80  Resp: 16 11 19  (!) 26  Temp: 98.7 F (37.1 C)     TempSrc: Oral     SpO2: 100% 96% 96% 92%  Weight: 65.8 kg     Height: 6' (1.829 m)        Vitals:   05/17/20 2141 05/17/20 2300 05/17/20 2330 05/18/20 0000  BP: (!) 143/92 (!) 158/96 (!) 153/88 (!) 177/104  Pulse: 90 77 79 80  Resp: 16 11 19  (!) 26  Temp: 98.7 F (37.1 C)     TempSrc: Oral     SpO2: 100% 96% 96% 92%  Weight: 65.8 kg     Height: 6' (1.829 m)      General: - slender woman in no distress Eyes: PERRL, lids and conjunctivae normal, but dose have bilateral lid-lag. ENMT: Mucous membranes are moist. Posterior pharynx clear of any exudate or lesions.Normal dentition.  Neck: normal, supple, no masses, no thyromegaly Respiratory: Diffuse bilateral rhonchi with decreased breath sounds.  Normal respiratory effort. No accessory muscle use.  Cardiovascular: Regular rate and rhythm, no murmurs / rubs / gallops. No extremity edema. 2+ pedal pulses. No carotid bruits.  Abdomen: no tenderness, no masses palpated. No hepatosplenomegaly. Bowel sounds positive.  Musculoskeletal: no clubbing / cyanosis. No joint deformity upper and lower extremities. Good ROM, no contractures. Normal muscle tone.  Skin:  Rashes- dark macular rash upper back, no  lesions, no ulcers. No induration Neurologic: CN 2-12: nl facial symmetry and movement, nl shoulder shrug, EOMI, PERRLA, no deviation tongue or uvula, vision grossly intact, hearing normal. MS - moves all limbs against gravity and offers good resistance. Nl grip strength. DTRs 2+ symmetrical. Sensation intact to light touch  Psychiatric: Normal judgment and insight. Alert and oriented x 3. Normal mood.     Labs on Admission: I have personally reviewed following labs and imaging studies  CBC: Recent Labs  Lab 05/17/20 2206  WBC 5.6  NEUTROABS 4.4  HGB 13.5  HCT 40.6  MCV 86.2  PLT 151   Basic  Metabolic Panel: Recent Labs  Lab 05/17/20 2206  NA 138  K 3.8  CL 100  CO2 26  GLUCOSE 121*  BUN 29*  CREATININE 1.52*  CALCIUM 10.5*  MG 2.1   GFR: Estimated Creatinine Clearance: 39.4 mL/min (A) (by C-G formula based on SCr of 1.52 mg/dL (H)). Liver Function Tests: Recent Labs  Lab 05/17/20 2206  AST 24  ALT 15  ALKPHOS 56  BILITOT 0.9  PROT 7.4  ALBUMIN 3.6   No results for input(s): LIPASE, AMYLASE in the last 168 hours. Recent Labs  Lab 05/17/20 2231  AMMONIA 11   Coagulation Profile: Recent Labs  Lab 05/17/20 2206  INR 1.0   Cardiac Enzymes: Recent Labs  Lab 05/17/20 2206  CKTOTAL 38   BNP (last 3 results) No results for input(s): PROBNP in the last 8760 hours. HbA1C: No results for input(s): HGBA1C in the last 72 hours. CBG: Recent Labs  Lab 05/17/20 2148  GLUCAP 127*   Lipid Profile: No results for input(s): CHOL, HDL, LDLCALC, TRIG, CHOLHDL, LDLDIRECT in the last 72 hours. Thyroid Function Tests: Recent Labs    05/17/20 2206  TSH 1.259   Anemia Panel: No results for input(s): VITAMINB12, FOLATE, FERRITIN, TIBC, IRON, RETICCTPCT in the last 72 hours. Urine analysis: No results found for: COLORURINE, APPEARANCEUR, LABSPEC, PHURINE, GLUCOSEU, HGBUR, BILIRUBINUR, KETONESUR, PROTEINUR, UROBILINOGEN, NITRITE, LEUKOCYTESUR  Radiological Exams on Admission: DG Chest 1 View  Result Date: 05/17/2020 CLINICAL DATA:  Weakness for 2 weeks. Difficulty with speech starting this week. EXAM: CHEST  1 VIEW COMPARISON:  06/26/2010 FINDINGS: Borderline heart size. Diffuse peribronchial thickening with streaky perihilar opacities and patchy areas of consolidation suggested in the right middle and both lower lungs. Changes may be due to edema or multifocal pneumonia. No pleural effusions. No pneumothorax. Mediastinal contours appear intact. Tortuous aorta. IMPRESSION: Peribronchial thickening with streaky perihilar opacities and patchy areas of consolidation  in the right middle and both lower lungs. Changes may be due to edema or multifocal pneumonia. Electronically Signed   By: Burman Nieves M.D.   On: 05/17/2020 22:32   CT HEAD WO CONTRAST  Result Date: 05/17/2020 CLINICAL DATA:  Weakness for 2 weeks EXAM: CT HEAD WITHOUT CONTRAST TECHNIQUE: Contiguous axial images were obtained from the base of the skull through the vertex without intravenous contrast. COMPARISON:  None. FINDINGS: Brain: No evidence of acute territorial infarction, hemorrhage, hydrocephalus,extra-axial collection or mass lesion/mass effect. There is dilatation the ventricles and sulci consistent with age-related atrophy. Low-attenuation changes in the deep white matter consistent with small vessel ischemia. Vascular: No hyperdense vessel or unexpected calcification. Skull: The skull is intact. No fracture or focal lesion identified. Sinuses/Orbits: The visualized paranasal sinuses and mastoid air cells are clear. The orbits and globes intact. Other: None IMPRESSION: No acute intracranial abnormality. Findings consistent with age related atrophy and chronic small vessel ischemia  Electronically Signed   By: Jonna Clark M.D.   On: 05/17/2020 22:57    EKG: Independently reviewed. Sins rhythm, flipped T wave I, II, V4,5,6 suggestive of ischemia.  Assessment/Plan Active Problems:   Dehydration   Slurred speech   Pneumonia due to COVID-19 virus     1. Covid pneumonia - patient with a prolonged illness but presents with hypoxemia and weakness, Covid Positive, Influenza negative, fibrin split products markedly elevated. She has not been vaccinated. Plan Med-surg admit  Remdesivir per pharmacy consult  Methylprednisolone IV q 12 and then transition to oral steroids  O2 support to keep sats >90  Covid protocols  For failure to respond or progressive hypoxemia she is a candidate for immunologics  2. Neuro - report of facial droop and slurred speech. Neuro exam unremarkable. CT head  negative for bleed or old injury. MRI brain pending. Suspect symptoms related to hypoxemia and dehydration. Plan No acute tx    Adjust therapy if MRI is positive for injury.  3. Dehydration - Creatinine elevated. Received IV bolus in ED Plan IVF D5 1/2 NS 75 cc/hr   DVT prophylaxis: lovenox - nl dose, renal insufficiency should rapidly improve with hydration.  Code Status: full code  Family Communication: attempted to call Sharol Harness, listed as emergency contact. NO answer  Disposition Plan: home when stable  Consults called: none  Admission status: inpatient    Illene Regulus MD [facility] Pager [phone]  If 7PM-7AM, please contact night-coverage [email or web address] Password [facility]  05/18/2020, 12:48 AM

## 2020-05-18 NOTE — ED Notes (Signed)
Blood glucose 110.

## 2020-05-18 NOTE — Progress Notes (Signed)
Remdesivir - Pharmacy Brief Note   O:  CXR: "Peribronchial thickening with streaky perihilar opacities and patchy areas of consolidation in the right middle and both lower lungs. Changes may be due to edema or multifocal pneumonia." SpO2: 92-100%    A/P:  Remdesivir 200 mg IVPB once followed by 100 mg IVPB daily x 4 days.   Otelia Sergeant, PharmD, [facility] 05/18/2020 12:37 AM

## 2020-05-18 NOTE — Progress Notes (Signed)
PROGRESS NOTE    Stephanie [name]  MPN:[id number] DOB: [birth year] DOA: 05/17/2020 PCP: Pcp, No   Chief complaint.  Shortness of breath and weakness Brief Narrative:  hysterectomy. She reports that she has felt ill for about 3 weeks. Initially she had diarrhea which did clear. However, she has had progressive weakness to the point where it is difficult to manage ADLs and she has had progressive SOB with a non-productive cough. Due to her symptoms she presents to [facility]ED for evaluation. Upon arriving the hospital, she was found to be Covid positive.  Chest x-ray showed patchy infiltrates.  She was started on IV steroids, remdesivir. 1/2.   MRI showed acute stroke.   Assessment & Plan:   Active Problems:   Pneumonia due to COVID-19 virus   Dehydration   Slurred speech   #1.  Multifocal pneumonia secondary to COVID-19. Patient currently has no hypoxemia, continue remdesivir for now.  May discontinue tomorrow if discharged home tomorrow. Patient also has very mild elevation of procalcitonin level at 0.19.  She also had diffuse rhonchi, concern for aspiration.  Obtain speech therapy evaluation. Patient received Rocephin and Zithromax yesterday.  Will hold for now. Recheck a procalcitonin level tomorrow.  #2.  Acute ischemic stroke. MRI showed left frontal ischemic stroke.  Neurology has seen the patient, start aspirin Plavix.  No need for statin.  #3. Essential hypertension. We will start calcium channel blocker tomorrow if blood pressure still running high    DVT prophylaxis: Lovenox Code Status: Full Family Communication: None Disposition Plan:  .   Status is: Inpatient  Remains inpatient appropriate because:Inpatient level of care appropriate due to severity of illness   Dispo: The patient is from: Home              Anticipated d/c is to: Home              Anticipated d/c date is: 1 day              Patient currently is not medically stable to d/c.        I/O last 3  completed shifts: In: 100 [IV Piggyback:100] Out: 560 [Urine:560] Total I/O In: 95 [IV Piggyback:95] Out: -      Consultants:   Neurology  Procedures: None  Antimicrobials: None  Subjective: Patient feels better today.  No hypoxia.  Denies any short of breath.  She denies ever choking or dysphagia. Speech has recovered. No abdominal pain or nausea vomiting.  Some loose stools. No fever or chills. No dysuria hematuria.  Objective: Vitals:   05/18/20 0830 05/18/20 0900 05/18/20 0930 05/18/20 1256  BP: (!) 168/100 (!) 174/91 (!) 167/102 (!) 154/74  Pulse: 65 63 74 75  Resp: (!) 22 20 18 17   Temp:    98 F (36.7 C)  TempSrc:    Oral  SpO2: 97% 97% 94% 96%  Weight:      Height:        Intake/Output Summary (Last 24 hours) at 05/18/2020 1320 Last data filed at 05/18/2020 1003 Gross per 24 hour  Intake 195 ml  Output 560 ml  Net -365 ml   Filed Weights   05/17/20 2141  Weight: 65.8 kg    Examination:  General exam: Appears calm and comfortable  Respiratory system: Bilateral rhonchi. Respiratory effort normal. Cardiovascular system: S1 & S2 heard, RRR. No JVD, murmurs, rubs, gallops or clicks. No pedal edema. Gastrointestinal system: Abdomen is nondistended, soft and nontender. No organomegaly or masses felt. Normal  bowel sounds heard. Central nervous system: Alert and oriented x3. No focal neurological deficits. Extremities: Symmetric 5 x 5 power. Skin: No rashes, lesions or ulcers Psychiatry: Mood & affect appropriate.     Data Reviewed: I have personally reviewed following labs and imaging studies  CBC: Recent Labs  Lab 05/17/20 2206  WBC 5.6  NEUTROABS 4.4  HGB 13.5  HCT 40.6  MCV 86.2  PLT 151   Basic Metabolic Panel: Recent Labs  Lab 05/17/20 2206  NA 138  K 3.8  CL 100  CO2 26  GLUCOSE 121*  BUN 29*  CREATININE 1.52*  CALCIUM 10.5*  MG 2.1   GFR: Estimated Creatinine Clearance: 39.4 mL/min (A) (by C-G formula based on SCr of 1.52  mg/dL (H)). Liver Function Tests: Recent Labs  Lab 05/17/20 2206  AST 24  ALT 15  ALKPHOS 56  BILITOT 0.9  PROT 7.4  ALBUMIN 3.6   No results for input(s): LIPASE, AMYLASE in the last 168 hours. Recent Labs  Lab 05/17/20 2231  AMMONIA 11   Coagulation Profile: Recent Labs  Lab 05/17/20 2206  INR 1.0   Cardiac Enzymes: Recent Labs  Lab 05/17/20 2206  CKTOTAL 38   BNP (last 3 results) No results for input(s): PROBNP in the last 8760 hours. HbA1C: No results for input(s): HGBA1C in the last 72 hours. CBG: Recent Labs  Lab 05/17/20 2148 05/18/20 0726  GLUCAP 127* 160*   Lipid Profile: Recent Labs    05/18/20 0206  CHOL 126  HDL 48  LDLCALC 59  TRIG 95  CHOLHDL 2.6   Thyroid Function Tests: Recent Labs    05/17/20 2206  TSH 1.259   Anemia Panel: Recent Labs    05/18/20 0206  FERRITIN 545*   Sepsis Labs: Recent Labs  Lab 05/17/20 2206  PROCALCITON 0.19    Recent Results (from the past 240 hour(s))  Resp Panel by RT-PCR (Flu A&B, Covid) Nasopharyngeal Swab     Status: Abnormal   Collection Time: 05/17/20 10:31 PM   Specimen: Nasopharyngeal Swab; Nasopharyngeal(NP) swabs in vial transport medium  Result Value Ref Range Status   SARS Coronavirus 2 by RT PCR POSITIVE (A) NEGATIVE Final    Comment: RESULT CALLED TO, READ BACK BY AND VERIFIED WITH: jenna elington @2350  on 05/17/20 skl (NOTE) SARS-CoV-2 target nucleic acids are DETECTED.  The SARS-CoV-2 RNA is generally detectable in upper respiratory specimens during the acute phase of infection. Positive results are indicative of the presence of the identified virus, but do not rule out bacterial infection or co-infection with other pathogens not detected by the test. Clinical correlation with patient history and other diagnostic information is necessary to determine patient infection status. The expected result is Negative.  Fact Sheet for  Patients: 07/15/20  Fact Sheet for Healthcare Providers: BloggerCourse.com  This test is not yet approved or cleared by the [employer] FDA and  has been authorized for detection and/or diagnosis of SARS-CoV-2 by FDA under an Emergency Use Authorization (EUA).  This EUA will remain in effect (meaning this test can  be used) for the duration of  the COVID-19 declaration under Section 564(b)(1) of the Act, 21 U.S.C. section 360bbb-3(b)(1), unless the authorization is terminated or revoked sooner.     Influenza A by PCR NEGATIVE NEGATIVE Final   Influenza B by PCR NEGATIVE NEGATIVE Final    Comment: (NOTE) The Xpert Xpress SARS-CoV-2/FLU/RSV plus assay is intended as an aid in the diagnosis of influenza from Nasopharyngeal swab specimens and  should not be used as a sole basis for treatment. Nasal washings and aspirates are unacceptable for Xpert Xpress SARS-CoV-2/FLU/RSV testing.  Fact Sheet for Patients: EntrepreneurPulse.com.au  Fact Sheet for Healthcare Providers: IncredibleEmployment.be  This test is not yet approved or cleared by the [place] FDA and has been authorized for detection and/or diagnosis of SARS-CoV-2 by FDA under an Emergency Use Authorization (EUA). This EUA will remain in effect (meaning this test can be used) for the duration of the COVID-19 declaration under Section 564(b)(1) of the Act, 21 U.S.C. section 360bbb-3(b)(1), unless the authorization is terminated or revoked.  Performed at [facility], [address] [place], [place] [address]   Blood culture (routine x 2)     Status: None (Preliminary result)   Collection Time: 05/17/20 10:50 PM   Specimen: BLOOD  Result Value Ref Range Status   Specimen Description BLOOD BLOOD RIGHT FOREARM  Final   Special Requests   Final    BOTTLES DRAWN AEROBIC AND ANAEROBIC Blood Culture results may not be  optimal due to an excessive volume of blood received in culture bottles   Culture   Final    NO GROWTH < 12 HOURS Performed at [facility], [address], [place], [place] [address]    Report Status PENDING  Incomplete  Blood culture (routine x 2)     Status: None (Preliminary result)   Collection Time: 05/17/20 10:55 PM   Specimen: BLOOD  Result Value Ref Range Status   Specimen Description BLOOD BLOOD LEFT FOREARM  Final   Special Requests   Final    BOTTLES DRAWN AEROBIC AND ANAEROBIC Blood Culture adequate volume   Culture   Final    NO GROWTH < 12 HOURS Performed at [facility], [address], [place],  [address]    Report Status PENDING  Incomplete         Radiology Studies: DG Chest 1 View  Result Date: 05/17/2020 CLINICAL DATA:  Weakness for 2 weeks. Difficulty with speech starting this week. EXAM: CHEST  1 VIEW COMPARISON:  06/26/2010 FINDINGS: Borderline heart size. Diffuse peribronchial thickening with streaky perihilar opacities and patchy areas of consolidation suggested in the right middle and both lower lungs. Changes may be due to edema or multifocal pneumonia. No pleural effusions. No pneumothorax. Mediastinal contours appear intact. Tortuous aorta. IMPRESSION: Peribronchial thickening with streaky perihilar opacities and patchy areas of consolidation in the right middle and both lower lungs. Changes may be due to edema or multifocal pneumonia. Electronically Signed   By: Lucienne Capers M.D.   On: 05/17/2020 22:32   CT HEAD WO CONTRAST  Result Date: 05/17/2020 CLINICAL DATA:  Weakness for 2 weeks EXAM: CT HEAD WITHOUT CONTRAST TECHNIQUE: Contiguous axial images were obtained from the base of the skull through the vertex without intravenous contrast. COMPARISON:  None. FINDINGS: Brain: No evidence of acute territorial infarction, hemorrhage, hydrocephalus,extra-axial collection or mass lesion/mass effect. There is dilatation the ventricles  and sulci consistent with age-related atrophy. Low-attenuation changes in the deep white matter consistent with small vessel ischemia. Vascular: No hyperdense vessel or unexpected calcification. Skull: The skull is intact. No fracture or focal lesion identified. Sinuses/Orbits: The visualized paranasal sinuses and mastoid air cells are clear. The orbits and globes intact. Other: None IMPRESSION: No acute intracranial abnormality. Findings consistent with age related atrophy and chronic small vessel ischemia Electronically Signed   By: Prudencio Pair M.D.   On: 05/17/2020 22:57   MR BRAIN WO CONTRAST  Result Date: 05/18/2020 CLINICAL DATA:  Progressive weakness. EXAM: MRI HEAD WITHOUT CONTRAST TECHNIQUE: Multiplanar, multiecho pulse sequences of the brain and surrounding structures were obtained without intravenous contrast. COMPARISON:  None. FINDINGS: Brain: Small focus of abnormal diffusion restriction within the medial left frontal lobe. No acute hemorrhage. Focus of chronic microhemorrhage in the right pons. Confluent hyperintense T2-weighted white matter signal. Normal parenchymal volume and CSF spaces. The midline structures are normal. Vascular: There is an area of low signal near the cavernous segment of the right internal carotid artery (14:9) that could be an aneurysm. Flow voids are otherwise normal. Skull and upper cervical spine: Normal calvarium and skull base. Visualized upper cervical spine and soft tissues are normal. Sinuses/Orbits:No paranasal sinus fluid levels or advanced mucosal thickening. No mastoid or middle ear effusion. Normal orbits. IMPRESSION: 1. Small focus of acute ischemia within the medial left frontal lobe. No hemorrhage or mass effect. 2. 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 Recommend further characterization with MRA or CTA of the head. 3. Confluent hyperintense T2-weighted white matter signal, consistent with chronic small vessel  ischemic disease. Electronically Signed   By: Deatra Robinson M.D.   On: 05/18/2020 01:49        Scheduled Meds: . aspirin EC  81 mg Oral Daily  . clopidogrel  300 mg Oral Once   Followed by  . [START ON 05/19/2020] clopidogrel  75 mg Oral Daily  . enoxaparin (LOVENOX) injection  40 mg Subcutaneous Q24H  . insulin aspart  0-15 Units Subcutaneous TID WC  . insulin detemir  0.075 Units/kg Subcutaneous BID  . Ipratropium-Albuterol  1 puff Inhalation Q6H  . methylPREDNISolone (SOLU-MEDROL) injection  0.5 mg/kg Intravenous Q12H   Followed by  . [START ON 05/21/2020] predniSONE  50 mg Oral Daily  . senna  1 tablet Oral BID   Continuous Infusions: . dextrose 5 % and 0.45% NaCl 75 mL/hr at 05/18/20 0240  . remdesivir 100 mg in NS 100 mL Stopped (05/18/20 1003)     LOS: 0 days    Time spent: 28 minutes    Marrion Coy, MD [facility]   To contact the attending provider between 7A-7P or the covering provider during after hours 7P-7A, please log into the web site [email or web address] and access using universal [place] password for that web site. If you do not have the password, please call the hospital operator.  05/18/2020, 1:20 PM

## 2020-05-18 NOTE — ED Notes (Signed)
pts oxygen changed from 5lpm to 4lpm via nasal canula ([place])

## 2020-05-19 ENCOUNTER — Inpatient Hospital Stay: Payer: HRSA Program

## 2020-05-19 ENCOUNTER — Encounter: Payer: Self-pay | Admitting: Internal Medicine

## 2020-05-19 ENCOUNTER — Inpatient Hospital Stay (HOSPITAL_COMMUNITY)
Admit: 2020-05-19 | Discharge: 2020-05-19 | Disposition: A | Discharge status: Home / Self Care | Payer: HRSA Program | Attending: Neurology | Admitting: Neurology

## 2020-05-19 DIAGNOSIS — I63522 Cerebral infarction due to unspecified occlusion or stenosis of left anterior cerebral artery: Secondary | ICD-10-CM

## 2020-05-19 DIAGNOSIS — J9601 Acute respiratory failure with hypoxia: Secondary | ICD-10-CM

## 2020-05-19 DIAGNOSIS — U071 COVID-19: Secondary | ICD-10-CM

## 2020-05-19 LAB — COMPREHENSIVE METABOLIC PANEL
ALT: 13 U/L (ref 0–44)
AST: 19 U/L (ref 15–41)
Albumin: 2.9 g/dL — ABNORMAL LOW (ref 3.5–5.0)
Alkaline Phosphatase: 51 U/L (ref 38–126)
Anion gap: 6 (ref 5–15)
BUN: 28 mg/dL — ABNORMAL HIGH (ref 8–23)
CO2: 23 mmol/L (ref 22–32)
Calcium: 9.9 mg/dL (ref 8.9–10.3)
Chloride: 107 mmol/L (ref 98–111)
Creatinine, Ser: 0.97 mg/dL (ref 0.44–1.00)
GFR, Estimated: >60 mL/min (ref >60)
Glucose, Bld: 141 mg/dL — ABNORMAL HIGH (ref 70–99)
Potassium: 3.9 mmol/L (ref 3.5–5.1)
Sodium: 136 mmol/L (ref 135–145)
Total Bilirubin: 0.5 mg/dL (ref 0.3–1.2)
Total Protein: 6.1 g/dL — ABNORMAL LOW (ref 6.5–8.1)

## 2020-05-19 LAB — CBC WITH DIFFERENTIAL/PLATELET
Abs Immature Granulocytes: 0.07 10*3/uL (ref 0.00–0.07)
Basophils Absolute: 0 10*3/uL (ref 0.0–0.1)
Basophils Relative: 0 %
Eosinophils Absolute: 0 10*3/uL (ref 0.0–0.5)
Eosinophils Relative: 0 %
HCT: 40 % (ref 36.0–46.0)
Hemoglobin: 13.1 g/dL (ref 12.0–15.0)
Immature Granulocytes: 1 %
Lymphocytes Relative: 9 %
Lymphs Abs: 0.9 10*3/uL (ref 0.7–4.0)
MCH: 29 pg (ref 26.0–34.0)
MCHC: 32.8 g/dL (ref 30.0–36.0)
MCV: 88.5 fL (ref 80.0–100.0)
Monocytes Absolute: 1 10*3/uL (ref 0.1–1.0)
Monocytes Relative: 9 %
Neutro Abs: 8.4 10*3/uL — ABNORMAL HIGH (ref 1.7–7.7)
Neutrophils Relative %: 81 %
Platelets: 196 10*3/uL (ref 150–400)
RBC: 4.52 MIL/uL (ref 3.87–5.11)
RDW: 13.1 % (ref 11.5–15.5)
WBC: 10.4 10*3/uL (ref 4.0–10.5)
nRBC: 0 % (ref 0.0–0.2)

## 2020-05-19 LAB — GLUCOSE, CAPILLARY
Glucose-Capillary: 128 mg/dL — ABNORMAL HIGH (ref 70–99)
Glucose-Capillary: 153 mg/dL — ABNORMAL HIGH (ref 70–99)

## 2020-05-19 LAB — ECHOCARDIOGRAM COMPLETE
AR max vel: 2.4 cm2
AV Area VTI: 2.5 cm2
AV Area mean vel: 2.11 cm2
AV Mean grad: 6.5 mmHg
AV Peak grad: 10 mmHg
Ao pk vel: 1.59 m/s
Area-P 1/2: 2.5 cm2
Height: 72 in
S' Lateral: 3.35 cm
Weight: 2320 oz

## 2020-05-19 LAB — MAGNESIUM: Magnesium: 1.8 mg/dL (ref 1.7–2.4)

## 2020-05-19 LAB — C-REACTIVE PROTEIN: CRP: 6.2 mg/dL — ABNORMAL HIGH (ref <1.0)

## 2020-05-19 LAB — PROCALCITONIN: Procalcitonin: <0.1 ng/mL

## 2020-05-19 LAB — CBG MONITORING, ED
Glucose-Capillary: 92 mg/dL (ref 70–99)
Glucose-Capillary: 119 mg/dL — ABNORMAL HIGH (ref 70–99)

## 2020-05-19 LAB — BRAIN NATRIURETIC PEPTIDE: B Natriuretic Peptide: 428 pg/mL — ABNORMAL HIGH (ref 0.0–100.0)

## 2020-05-19 LAB — RPR: RPR Ser Ql: NONREACTIVE

## 2020-05-19 MED ORDER — BARICITINIB 2 MG PO TABS: 4.0000 mg | ORAL_TABLET | Freq: Every day | ORAL | Status: DC

## 2020-05-19 MED ORDER — AMLODIPINE BESYLATE 5 MG PO TABS
5.0000 mg | ORAL_TABLET | Freq: Every day | ORAL | Status: DC
  Administered 2020-05-19: 5 mg via ORAL
  Filled 2020-05-19: qty 1

## 2020-05-19 MED ORDER — IOHEXOL 350 MG/ML SOLN
50.0000 mL | Freq: Once | INTRAVENOUS | Status: DC | PRN
  Filled 2020-05-19: qty 50

## 2020-05-19 MED ORDER — INFLUENZA VAC SPLIT QUAD 0.5 ML IM SUSY
0.5000 mL | PREFILLED_SYRINGE | INTRAMUSCULAR | Status: DC
  Filled 2020-05-19: qty 0.5

## 2020-05-19 MED ORDER — IOHEXOL 350 MG/ML SOLN
75.0000 mL | Freq: Once | INTRAVENOUS | Status: AC | PRN
  Administered 2020-05-19: 75 mL via INTRAVENOUS
  Filled 2020-05-19: qty 75

## 2020-05-19 MED ORDER — ENOXAPARIN SODIUM 40 MG/0.4ML ~~LOC~~ SOLN
40.0000 mg | SUBCUTANEOUS | Status: DC
  Administered 2020-05-19 – 2020-05-21 (×3): 40 mg via SUBCUTANEOUS
  Filled 2020-05-19 (×3): qty 0.4

## 2020-05-19 MED ORDER — FUROSEMIDE 10 MG/ML IJ SOLN
40.0000 mg | Freq: Two times a day (BID) | INTRAMUSCULAR | Status: DC
  Administered 2020-05-19 – 2020-05-20 (×2): 40 mg via INTRAVENOUS
  Filled 2020-05-19 (×2): qty 4

## 2020-05-19 NOTE — ED Notes (Signed)
Pt ate 25% of breakfast. 

## 2020-05-19 NOTE — Progress Notes (Signed)
Neurology Progress Note  Patient ID: Stephanie [name] is a 65 y.o. with with a past medical history of limited engagement with medical care, tobacco use (quit late 2021) unvaccinated for COVID-19, presenting with COVID-19 pneumonia Initially consulted for: generalized weakness and L frontal infarct found incidentally  Major interval events:  - MRA completed   Subjective: - Better than yesterday but still feeling poorly  Exam: Vitals:   05/19/20 0700 05/19/20 0800  BP: (!) 154/77 (!) 156/90  Pulse: 82 72  Resp: (!) 31 (!) 23  Temp:    SpO2: 94% 92%   Gen: In bed, dyspneic  Resp: labored breathing, SOB with any movement Cardiac: RRR  Abd: soft, nt  Neuro: MS: Awake, alert, orient to person/place/situation/date CN: face symmetric, tongue midline, EOMI, PERRL Motor: No pronator drift. BLE antigravity but uncomfortable [name] to bed  Pertinent Labs:  Lab Results  Component Value Date   HGBA1C 5.4 05/18/2020    RPR is negative  MRA personally reviewed as below   Impression: This is a 65 year old woman with past medical history significant for   Recommendations: # Small left frontal infarct likely in the setting of hypercoagulability from acute inflammatory state of COVID-19 in the background of undiagnosed small vessel risk factors  - Stroke labs TSH normal as above, CRP elevated as above, lipid profile is excellent as above, A1c is normal, RPR is negative - MRI brain completed (Small acute infarct in the left frontal lobe; moderate to severe T2/FLAIR white matter changes most c/w chronic microvascular disease; small microhemmorhage in the right medial pons (second image), favored to be chronic per radiology) - MRA of the brain without contrast and MRA neck w/wo completed, possible 3 mm outpouching of the PCOM bilaterally (infundibulum vs. 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  Patient should have outpatient neurology follow-up for these findings (surveillance imaging to confirm no rapid growth) - Frequent neuro checks, every 4 hours - Echocardiogram with mild left ventricular hypertrophy, grade 1 diastolic dysfunction, mild left atrial dilation - Continue Aspirin 81 mg daily - Continue Plavix 300 mg load with 75 mg daily for 21 day course  - Stop antiplatelet agents if an indication for anticoagulation is found, and resume aspirin 81 mg daily once anticoagulation course is complete, unless there is a non-neurologic indication for antiplatelet agents with anticoagulation - Risk factor modification, diet, exercise, continued smoking cessation, medical care follow-up  - Telemetry monitoring; 30 day event monitor on discharge if no arrythmias captured  - Blood pressure goal pending vessel imaging             -Gradual normalization of blood pressure over several days given likely chronically uncontrolled - PT consult, OT consult, Speech consult - Appreciate management of COVID-19 per primary team - No further inpatient work-up needed from neurology perspective at this time, please reach out if new neurologicial questions arise.  Brooke Dare MD-PhD [facility] [phone]

## 2020-05-19 NOTE — Progress Notes (Signed)
PROGRESS NOTE    Stephanie [name]  YCX:[id number] DOB: [birth year] DOA: 05/17/2020 PCP: Pcp, No   Chief complaint.  Shortness of breath  Brief Narrative:  Stephanie [name] is a 65 y.o. female with no significant medical history except for hysterectomy. She reports that she has felt ill for about 3 weeks. Initially she had diarrhea which did clear. However, she has had progressive weakness to the point where it is difficult to manage ADLs and she has had progressive SOB with a non-productive cough. Due to her symptoms she presents to [facility] for evaluation. Upon arriving the hospital, she was found to be Covid positive.  Chest x-ray showed patchy infiltrates.  She was started on IV steroids, remdesivir. 1/2.   MRI showed acute stroke. 1/3.  Developed acute hypoxemia, started baricitinib.   Assessment & Plan:   Active Problems:   Pneumonia due to COVID-19 virus   Dehydration   Acute ischemic left ACA stroke (HCC)  1.  Acute hypoxemic respiratory failure. Multilobar pneumonia secondary to COVID-19. Likely acute on chronic diastolic congestive heart failure. Patient developed hypoxemia overnight, currently on 4 L oxygen.  Since neurology is obtaining a CT angiogram of the head and neck, I also obtain a CT angiogram of the chest rule out PE. Patient has mild elevation of BNP, most likely acute on chronic diastolic congestive heart failure.  We will give Lasix 40 mg IV twice a day.  Pending echocardiogram results. Due to new stroke, increased crackles yesterday, need to rule out aspiration, speech therapy evaluation still pending. Continue steroids and remdesivir for Covid. Repeated procalcitonin level less than 0.1, no need for antibiotics.  2.  Acute ischemic stroke. Appreciate neurology management.  Continue aspirin Plavix.  3.  Essential hypertension. Blood pressure running high, add Norvasc.       DVT prophylaxis: Lovenox Code Status: Full Family Communication:  Disposition  Plan:  .   Status is: Inpatient  Remains inpatient appropriate because:Inpatient level of care appropriate due to severity of illness   Dispo: The patient is from: Home              Anticipated d/c is to: Home              Anticipated d/c date is: 2 days              Patient currently is not medically stable to d/c.        I/O last 3 completed shifts: In: 195 [IV Piggyback:195] Out: 1060 [Urine:1060] No intake/output data recorded.     Consultants:   Neurology  Procedures: None  Antimicrobials: None  Subjective: Patient developed hypoxemia last night, was started on 4 L oxygen.  She also had some short of breath, she did not have any chest pain. Cough, nonproductive. No fever chills per No abdominal pain or nausea vomiting. No dysuria hematuria. No headache or dizziness.  Objective: Vitals:   05/19/20 1100 05/19/20 1130 05/19/20 1215 05/19/20 1330  BP: (!) 168/83 (!) 171/82  (!) 150/84  Pulse: 73 72 74 70  Resp:      Temp:      TempSrc:      SpO2:  95% 94% 98%  Weight:      Height:        Intake/Output Summary (Last 24 hours) at 05/19/2020 1357 Last data filed at 05/19/2020 0148 Gross per 24 hour  Intake -  Output 500 ml  Net -500 ml   Filed Weights   05/17/20 2141  Weight: 65.8 kg    Examination:  General exam: Appears calm and comfortable  Respiratory system: Clear to auscultation. Respiratory effort normal. Cardiovascular system: S1 & S2 heard, RRR. No JVD, murmurs, rubs, gallops or clicks. No pedal edema. Gastrointestinal system: Abdomen is nondistended, soft and nontender. No organomegaly or masses felt. Normal bowel sounds heard. Central nervous system: Alert and oriented. No focal neurological deficits. Extremities: Symmetric 5 x 5 power. Skin: No rashes, lesions or ulcers Psychiatry:  Mood & affect appropriate.     Data Reviewed: I have personally reviewed following labs and imaging studies  CBC: Recent Labs  Lab 05/17/20 2206  05/19/20 0515  WBC 5.6 10.4  NEUTROABS 4.4 8.4*  HGB 13.5 13.1  HCT 40.6 40.0  MCV 86.2 88.5  PLT 151 627   Basic Metabolic Panel: Recent Labs  Lab 05/17/20 2206 05/19/20 0515  NA 138 136  K 3.8 3.9  CL 100 107  CO2 26 23  GLUCOSE 121* 141*  BUN 29* 28*  CREATININE 1.52* 0.97  CALCIUM 10.5* 9.9  MG 2.1 1.8   GFR: Estimated Creatinine Clearance: 61.7 mL/min (by C-G formula based on SCr of 0.97 mg/dL). Liver Function Tests: Recent Labs  Lab 05/17/20 2206 05/19/20 0515  AST 24 19  ALT 15 13  ALKPHOS 56 51  BILITOT 0.9 0.5  PROT 7.4 6.1*  ALBUMIN 3.6 2.9*   No results for input(s): LIPASE, AMYLASE in the last 168 hours. Recent Labs  Lab 05/17/20 2231  AMMONIA 11   Coagulation Profile: Recent Labs  Lab 05/17/20 2206  INR 1.0   Cardiac Enzymes: Recent Labs  Lab 05/17/20 2206  CKTOTAL 38   BNP (last 3 results) No results for input(s): PROBNP in the last 8760 hours. HbA1C: Recent Labs    05/18/20 0206  HGBA1C 5.4   CBG: Recent Labs  Lab 05/18/20 1708 05/18/20 2127 05/18/20 2348 05/19/20 0903 05/19/20 1347  GLUCAP 141* 134* 122* 92 119*   Lipid Profile: Recent Labs    05/18/20 0206  CHOL 126  HDL 48  LDLCALC 59  TRIG 95  CHOLHDL 2.6   Thyroid Function Tests: Recent Labs    05/17/20 2206  TSH 1.259   Anemia Panel: Recent Labs    05/18/20 0206  FERRITIN 545*   Sepsis Labs: Recent Labs  Lab 05/17/20 2206 05/19/20 0515  PROCALCITON 0.19 <0.10    Recent Results (from the past 240 hour(s))  Resp Panel by RT-PCR (Flu A&B, Covid) Nasopharyngeal Swab     Status: Abnormal   Collection Time: 05/17/20 10:31 PM   Specimen: Nasopharyngeal Swab; Nasopharyngeal(NP) swabs in vial transport medium  Result Value Ref Range Status   SARS Coronavirus 2 by RT PCR POSITIVE (A) NEGATIVE Final    Comment: RESULT CALLED TO, READ BACK BY AND VERIFIED WITH: jenna elington @2350  on 05/17/20 skl (NOTE) SARS-CoV-2 target nucleic acids are  DETECTED.  The SARS-CoV-2 RNA is generally detectable in upper respiratory specimens during the acute phase of infection. Positive results are indicative of the presence of the identified virus, but do not rule out bacterial infection or co-infection with other pathogens not detected by the test. Clinical correlation with patient history and other diagnostic information is necessary to determine patient infection status. The expected result is Negative.  Fact Sheet for Patients: EntrepreneurPulse.com.au  Fact Sheet for Healthcare Providers: IncredibleEmployment.be  This test is not yet approved or cleared by the [place] FDA and  has been authorized for detection and/or diagnosis of SARS-CoV-2 by  FDA under an Emergency Use Authorization (EUA).  This EUA will remain in effect (meaning this test can  be used) for the duration of  the COVID-19 declaration under Section 564(b)(1) of the Act, 21 U.S.C. section 360bbb-3(b)(1), unless the authorization is terminated or revoked sooner.     Influenza A by PCR NEGATIVE NEGATIVE Final   Influenza B by PCR NEGATIVE NEGATIVE Final    Comment: (NOTE) The Xpert Xpress SARS-CoV-2/FLU/RSV plus assay is intended as an aid in the diagnosis of influenza from Nasopharyngeal swab specimens and should not be used as a sole basis for treatment. Nasal washings and aspirates are unacceptable for Xpert Xpress SARS-CoV-2/FLU/RSV testing.  Fact Sheet for Patients: BloggerCourse.com  Fact Sheet for Healthcare Providers: SeriousBroker.it  This test is not yet approved or cleared by the [place] FDA and has been authorized for detection and/or diagnosis of SARS-CoV-2 by FDA under an Emergency Use Authorization (EUA). This EUA will remain in effect (meaning this test can be used) for the duration of the COVID-19 declaration under Section 564(b)(1) of the Act, 21  U.S.C. section 360bbb-3(b)(1), unless the authorization is terminated or revoked.  Performed at [facility], [address] [place], [place] [address]   Blood culture (routine x 2)     Status: None (Preliminary result)   Collection Time: 05/17/20 10:50 PM   Specimen: BLOOD  Result Value Ref Range Status   Specimen Description BLOOD BLOOD RIGHT FOREARM  Final   Special Requests   Final    BOTTLES DRAWN AEROBIC AND ANAEROBIC Blood Culture results may not be optimal due to an excessive volume of blood received in culture bottles   Culture   Final    NO GROWTH 2 DAYS Performed at [facility], [address] [place], [place] [address]    Report Status PENDING  Incomplete  Blood culture (routine x 2)     Status: None (Preliminary result)   Collection Time: 05/17/20 10:55 PM   Specimen: BLOOD  Result Value Ref Range Status   Specimen Description BLOOD BLOOD LEFT FOREARM  Final   Special Requests   Final    BOTTLES DRAWN AEROBIC AND ANAEROBIC Blood Culture adequate volume   Culture   Final    NO GROWTH 2 DAYS Performed at [facility], [address], [place], [place] [address]    Report Status PENDING  Incomplete         Radiology Studies: DG Chest 1 View  Result Date: 05/19/2020 CLINICAL DATA:  Recent COVID-19 positive EXAM: CHEST  1 VIEW COMPARISON:  May 17, 2020 FINDINGS: There is ill-defined opacity in the right mid lung and bibasilar regions, similar to 2 days prior. No new opacity evident. No consolidation. Heart size and pulmonary vascularity are normal. No adenopathy. Aorta is somewhat tortuous, stable. No bone lesions. IMPRESSION: Ill-defined airspace opacity right mid lung and bibasilar regions, likely due to atypical organism pneumonia. No new opacity. Stable cardiac silhouette. Electronically Signed   By: Bretta Bang III M.D.   On: 05/19/2020 08:25   DG Chest 1 View  Result Date: 05/17/2020 CLINICAL DATA:  Weakness for 2 weeks.  Difficulty with speech starting this week. EXAM: CHEST  1 VIEW COMPARISON:  06/26/2010 FINDINGS: Borderline heart size. Diffuse peribronchial thickening with streaky perihilar opacities and patchy areas of consolidation suggested in the right middle and both lower lungs. Changes may be due to edema or multifocal pneumonia. No pleural effusions. No pneumothorax. Mediastinal contours appear intact. Tortuous aorta. IMPRESSION:  Peribronchial thickening with streaky perihilar opacities and patchy areas of consolidation in the right middle and both lower lungs. Changes may be due to edema or multifocal pneumonia. Electronically Signed   By: Burman Nieves M.D.   On: 05/17/2020 22:32   CT HEAD WO CONTRAST  Result Date: 05/17/2020 CLINICAL DATA:  Weakness for 2 weeks EXAM: CT HEAD WITHOUT CONTRAST TECHNIQUE: Contiguous axial images were obtained from the base of the skull through the vertex without intravenous contrast. COMPARISON:  None. FINDINGS: Brain: No evidence of acute territorial infarction, hemorrhage, hydrocephalus,extra-axial collection or mass lesion/mass effect. There is dilatation the ventricles and sulci consistent with age-related atrophy. Low-attenuation changes in the deep white matter consistent with small vessel ischemia. Vascular: No hyperdense vessel or unexpected calcification. Skull: The skull is intact. No fracture or focal lesion identified. Sinuses/Orbits: The visualized paranasal sinuses and mastoid air cells are clear. The orbits and globes intact. Other: None IMPRESSION: No acute intracranial abnormality. Findings consistent with age related atrophy and chronic small vessel ischemia Electronically Signed   By: Jonna Clark M.D.   On: 05/17/2020 22:57   MR ANGIO HEAD WO CONTRAST  Result Date: 05/18/2020 CLINICAL DATA:  Stroke.  Possible aneurysm right cavernous carotid. EXAM: MRA NECK WITHOUT AND WITH CONTRAST MRA HEAD WITHOUT CONTRAST TECHNIQUE: Multiplanar and multiecho pulse sequences  of the neck were obtained without and with intravenous contrast. Angiographic images of the neck were obtained using MRA technique without and with intravenous contrast; Angiographic images of the Circle of Willis were obtained using MRA technique without intravenous contrast. CONTRAST:  10mL GADAVIST GADOBUTROL 1 MMOL/ML IV SOLN COMPARISON:  MRI head 05/18/2020.  CT head 05/17/2020 FINDINGS: MRA NECK FINDINGS Postcontrast images are nondiagnostic as they are venous phase. Unenhanced time-of-flight demonstrates antegrade flow in the carotid and vertebral arteries. The carotid bifurcation appears patent bilaterally without stenosis. Both vertebral arteries are patent. MRA HEAD FINDINGS The internal carotid artery is widely patent bilaterally. There is an outpouching measuring approximately 3 mm involving the region the posterior communicating artery bilaterally. Possible infundibulum versus aneurysm. The larger abnormality in the right cavernous sinus with calcific wall does not have flow related signal on MRA however could be a thrombosed aneurysm. Anterior and middle cerebral arteries widely patent bilaterally Posterior circulation widely patent without stenosis or aneurysm IMPRESSION: 1. 3 mm outpouching in the region of the posterior communicating artery bilaterally. Possible infundibulum versus aneurysm. CTA may be helpful. 2. The larger 14 mm abnormality in the right cavernous sinus with calcific wall on CT does not have flow related signal but could be a thrombosed aneurysm. CTA head recommended for further evaluation. 3. Limited MRA neck. No significant carotid or vertebral artery stenosis. Electronically Signed   By: Marlan Palau M.D.   On: 05/18/2020 14:39   MR ANGIO NECK W WO CONTRAST  Result Date: 05/18/2020 CLINICAL DATA:  Stroke.  Possible aneurysm right cavernous carotid. EXAM: MRA NECK WITHOUT AND WITH CONTRAST MRA HEAD WITHOUT CONTRAST TECHNIQUE: Multiplanar and multiecho pulse sequences of the  neck were obtained without and with intravenous contrast. Angiographic images of the neck were obtained using MRA technique without and with intravenous contrast; Angiographic images of the Circle of Willis were obtained using MRA technique without intravenous contrast. CONTRAST:  33mL GADAVIST GADOBUTROL 1 MMOL/ML IV SOLN COMPARISON:  MRI head 05/18/2020.  CT head 05/17/2020 FINDINGS: MRA NECK FINDINGS Postcontrast images are nondiagnostic as they are venous phase. Unenhanced time-of-flight demonstrates antegrade flow in the carotid and vertebral arteries. The carotid  bifurcation appears patent bilaterally without stenosis. Both vertebral arteries are patent. MRA HEAD FINDINGS The internal carotid artery is widely patent bilaterally. There is an outpouching measuring approximately 3 mm involving the region the posterior communicating artery bilaterally. Possible infundibulum versus aneurysm. The larger abnormality in the right cavernous sinus with calcific wall does not have flow related signal on MRA however could be a thrombosed aneurysm. Anterior and middle cerebral arteries widely patent bilaterally Posterior circulation widely patent without stenosis or aneurysm IMPRESSION: 1. 3 mm outpouching in the region of the posterior communicating artery bilaterally. Possible infundibulum versus aneurysm. CTA may be helpful. 2. The larger 14 mm abnormality in the right cavernous sinus with calcific wall on CT does not have flow related signal but could be a thrombosed aneurysm. CTA head recommended for further evaluation. 3. Limited MRA neck. No significant carotid or vertebral artery stenosis. Electronically Signed   By: Marlan Palau M.D.   On: 05/18/2020 14:39   MR BRAIN WO CONTRAST  Result Date: 05/18/2020 CLINICAL DATA:  Progressive weakness. EXAM: MRI HEAD WITHOUT CONTRAST TECHNIQUE: Multiplanar, multiecho pulse sequences of the brain and surrounding structures were obtained without intravenous contrast.  COMPARISON:  None. FINDINGS: Brain: Small focus of abnormal diffusion restriction within the medial left frontal lobe. No acute hemorrhage. Focus of chronic microhemorrhage in the right pons. Confluent hyperintense T2-weighted white matter signal. Normal parenchymal volume and CSF spaces. The midline structures are normal. Vascular: There is an area of low signal near the cavernous segment of the right internal carotid artery (14:9) that could be an aneurysm. Flow voids are otherwise normal. Skull and upper cervical spine: Normal calvarium and skull base. Visualized upper cervical spine and soft tissues are normal. Sinuses/Orbits:No paranasal sinus fluid levels or advanced mucosal thickening. No mastoid or middle ear effusion. Normal orbits. IMPRESSION: 1. Small focus of acute ischemia within the medial left frontal lobe. No hemorrhage or mass effect. 2. 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 Recommend further characterization with MRA or CTA of the head. 3. Confluent hyperintense T2-weighted white matter signal, consistent with chronic small vessel ischemic disease. Electronically Signed   By: Deatra Robinson M.D.   On: 05/18/2020 01:49        Scheduled Meds: . aspirin EC  81 mg Oral Daily  . clopidogrel  75 mg Oral Daily  . enoxaparin (LOVENOX) injection  40 mg Subcutaneous Q24H  . insulin aspart  0-15 Units Subcutaneous TID WC  . insulin detemir  0.075 Units/kg Subcutaneous BID  . Ipratropium-Albuterol  1 puff Inhalation Q6H  . methylPREDNISolone (SOLU-MEDROL) injection  0.5 mg/kg Intravenous Q12H   Followed by  . [START ON 05/21/2020] predniSONE  50 mg Oral Daily  . senna  1 tablet Oral BID   Continuous Infusions: . dextrose 5 % and 0.45% NaCl 75 mL/hr at 05/18/20 2342  . remdesivir 100 mg in NS 100 mL Stopped (05/18/20 1003)     LOS: 1 day    Time spent: 35 minutes    Marrion Coy, MD [facility]   To contact the attending provider  between 7A-7P or the covering provider during after hours 7P-7A, please log into the web site [email or web address] and access using universal [place] password for that web site. If you do not have the password, please call the hospital operator.  05/19/2020, 1:57 PM

## 2020-05-19 NOTE — ED Notes (Signed)
Lunch tray delivered to bedside. 

## 2020-05-19 NOTE — ED Notes (Signed)
Echo being done at bedside.

## 2020-05-19 NOTE — ED Notes (Signed)
Report messaged to [facility].  Pt alert.

## 2020-05-19 NOTE — Progress Notes (Signed)
Inpatient Diabetes Program Recommendations  AACE/ADA: New Consensus Statement on Inpatient Glycemic Control   Target Ranges:  Prepandial:   less than 140 mg/dL      Peak postprandial:   less than 180 mg/dL (1-2 hours)      Critically ill patients:  140 - 180 mg/dL   Results for Stephanie [name], Stephanie [name] (MRN [id number]) as of 05/19/2020 06:50  Ref. Range 05/18/2020 07:26 05/18/2020 16:03 05/18/2020 17:08 05/18/2020 21:27 05/18/2020 23:48  Glucose-Capillary Latest Ref Range: 70 - 99 mg/dL 939 (H) 53 (L) 030 (H) 134 (H) 122 (H)   Review of Glycemic Control  Diabetes history: NO Outpatient Diabetes medications: NA Current orders for Inpatient glycemic control: Levemir 5 units BID, Novolog 0-15 units TID with meals; Solumedrol 32.8 mg Q12H  Inpatient Diabetes Program Recommendations:    Insulin: May want to consider decreasing Novolog to 0-9 units TID with meals.  Thanks, Orlando Penner, RN, MSN, CDE Diabetes Coordinator Inpatient Diabetes Program [phone] (Team Pager from 8am to 5pm)

## 2020-05-19 NOTE — ED Notes (Signed)
Patient transported to CT 

## 2020-05-19 NOTE — ED Notes (Addendum)
CT tech noticed RAC IV (getting D5 1/2NS) was getting puffy at insertion site. Area assessed by this RN, IVF stopped, and IV will be taken out, site monitored on arrival back from CT

## 2020-05-19 NOTE — Progress Notes (Signed)
SLP Cancellation Note  Patient Details Name: Stephanie [name] MRN: [id number] DOB: [birth year]   Cancelled treatment:       Reason Eval/Treat Not Completed: Patient at procedure or test/unavailable   Pt out of room at CT. Will follow up for bedside swallow assessment on 05/20/2020.   Paulena Servais B. Dreama Saa M.S., CCC-SLP, [facility] Speech-Language Pathologist Rehabilitation Services Office [phone]    Reuel Derby 05/19/2020, 4:13 PM

## 2020-05-19 NOTE — Progress Notes (Signed)
*  PRELIMINARY RESULTS* Echocardiogram 2D Echocardiogram has been performed.  [name] 05/19/2020, 2:08 PM

## 2020-05-20 ENCOUNTER — Encounter: Payer: Self-pay | Admitting: Internal Medicine

## 2020-05-20 LAB — GLUCOSE, CAPILLARY
Glucose-Capillary: 146 mg/dL — ABNORMAL HIGH (ref 70–99)
Glucose-Capillary: 135 mg/dL — ABNORMAL HIGH (ref 70–99)
Glucose-Capillary: 149 mg/dL — ABNORMAL HIGH (ref 70–99)
Glucose-Capillary: 158 mg/dL — ABNORMAL HIGH (ref 70–99)

## 2020-05-20 LAB — COMPREHENSIVE METABOLIC PANEL
ALT: 15 U/L (ref 0–44)
AST: 16 U/L (ref 15–41)
Albumin: 3.3 g/dL — ABNORMAL LOW (ref 3.5–5.0)
Alkaline Phosphatase: 64 U/L (ref 38–126)
Anion gap: 12 (ref 5–15)
BUN: 27 mg/dL — ABNORMAL HIGH (ref 8–23)
CO2: 26 mmol/L (ref 22–32)
Calcium: 10.6 mg/dL — ABNORMAL HIGH (ref 8.9–10.3)
Chloride: 100 mmol/L (ref 98–111)
Creatinine, Ser: 1.1 mg/dL — ABNORMAL HIGH (ref 0.44–1.00)
GFR, Estimated: 56 mL/min — ABNORMAL LOW (ref >60)
Glucose, Bld: 141 mg/dL — ABNORMAL HIGH (ref 70–99)
Potassium: 3.5 mmol/L (ref 3.5–5.1)
Sodium: 138 mmol/L (ref 135–145)
Total Bilirubin: 0.7 mg/dL (ref 0.3–1.2)
Total Protein: 7.3 g/dL (ref 6.5–8.1)

## 2020-05-20 LAB — CBC WITH DIFFERENTIAL/PLATELET
Abs Immature Granulocytes: 0.2 10*3/uL — ABNORMAL HIGH (ref 0.00–0.07)
Basophils Absolute: 0 10*3/uL (ref 0.0–0.1)
Basophils Relative: 0 %
Eosinophils Absolute: 0 10*3/uL (ref 0.0–0.5)
Eosinophils Relative: 0 %
HCT: 42.6 % (ref 36.0–46.0)
Hemoglobin: 14.3 g/dL (ref 12.0–15.0)
Immature Granulocytes: 2 %
Lymphocytes Relative: 8 %
Lymphs Abs: 0.9 10*3/uL (ref 0.7–4.0)
MCH: 28.2 pg (ref 26.0–34.0)
MCHC: 33.6 g/dL (ref 30.0–36.0)
MCV: 84 fL (ref 80.0–100.0)
Monocytes Absolute: 0.9 10*3/uL (ref 0.1–1.0)
Monocytes Relative: 8 %
Neutro Abs: 9.5 10*3/uL — ABNORMAL HIGH (ref 1.7–7.7)
Neutrophils Relative %: 82 %
Platelets: 286 10*3/uL (ref 150–400)
RBC: 5.07 MIL/uL (ref 3.87–5.11)
RDW: 13.1 % (ref 11.5–15.5)
Smear Review: NORMAL
WBC: 11.5 10*3/uL — ABNORMAL HIGH (ref 4.0–10.5)
nRBC: 0 % (ref 0.0–0.2)

## 2020-05-20 LAB — MAGNESIUM: Magnesium: 1.7 mg/dL (ref 1.7–2.4)

## 2020-05-20 LAB — PROCALCITONIN: Procalcitonin: 0.16 ng/mL

## 2020-05-20 LAB — C-REACTIVE PROTEIN: CRP: 5.7 mg/dL — ABNORMAL HIGH (ref <1.0)

## 2020-05-20 MED ORDER — POTASSIUM CHLORIDE CRYS ER 20 MEQ PO TBCR
40.0000 meq | EXTENDED_RELEASE_TABLET | Freq: Once | ORAL | Status: AC
  Administered 2020-05-20: 40 meq via ORAL
  Filled 2020-05-20: qty 2

## 2020-05-20 MED ORDER — TRAMADOL HCL 50 MG PO TABS
50.0000 mg | ORAL_TABLET | Freq: Four times a day (QID) | ORAL | Status: DC | PRN
  Administered 2020-05-20 – 2020-05-27 (×9): 50 mg via ORAL
  Filled 2020-05-20 (×9): qty 1

## 2020-05-20 MED ORDER — DEXAMETHASONE 6 MG PO TABS
6.0000 mg | ORAL_TABLET | Freq: Every day | ORAL | Status: DC
  Administered 2020-05-20 – 2020-05-28 (×9): 6 mg via ORAL
  Filled 2020-05-20 (×9): qty 1

## 2020-05-20 MED ORDER — SODIUM CHLORIDE 0.9 % IV SOLN
100.0000 mg | Freq: Once | INTRAVENOUS | Status: DC
  Filled 2020-05-20 (×2): qty 20

## 2020-05-20 MED ORDER — MAGNESIUM SULFATE 2 GM/50ML IV SOLN
2.0000 g | Freq: Once | INTRAVENOUS | Status: AC
  Administered 2020-05-20: 2 g via INTRAVENOUS
  Filled 2020-05-20: qty 50

## 2020-05-20 MED ORDER — AMLODIPINE BESYLATE 10 MG PO TABS
10.0000 mg | ORAL_TABLET | Freq: Every day | ORAL | Status: DC
  Administered 2020-05-20 – 2020-05-29 (×10): 10 mg via ORAL
  Filled 2020-05-20 (×10): qty 1

## 2020-05-20 MED ORDER — LABETALOL HCL 5 MG/ML IV SOLN
20.0000 mg | INTRAVENOUS | Status: DC | PRN
  Administered 2020-05-20 – 2020-05-22 (×5): 20 mg via INTRAVENOUS
  Filled 2020-05-20 (×5): qty 4

## 2020-05-20 NOTE — Progress Notes (Signed)
SLP Cancellation Note  Patient Details Name: Stephanie [name] MRN: [id number] DOB: 1955/10/28   Cancelled treatment:       Reason Eval/Treat Not Completed: SLP screened, no needs identified, will sign off  Chart reviewed, pt and nurse consulted, no overt s/s of dysphagia/aspiration are present at this time. Please re-consult should pt's condition change.   Campbell Agramonte B. Dreama Saa M.S., CCC-SLP, [facility] Speech-Language Pathologist Rehabilitation Services Office [phone]   Dorsie Sethi Dreama Saa 05/20/2020, 10:50 AM

## 2020-05-20 NOTE — Evaluation (Signed)
Occupational Therapy Evaluation Patient Details Name: Stephanie [name] MRN: [id number] DOB: [birth year] Today's Date: 05/20/2020    History of Present Illness 65 y.o. female with no significant medical history except for hysterectomy. She reports that she has felt ill for about 3 weeks. Initially she had diarrhea which did clear. However, she has had progressive weakness to the point where it is difficult to manage ADLs and she has had progressive SOB with a non-productive cough. Due to her symptoms she presents to [facility] for evaluation.   Clinical Impression   Patient presenting with decreased I in self care, balance, functional mobility/transfer, endurance, cognition, and safety awareness. Patient reports being independent at home and living with two brothers who are disabled PTA. 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 Pt reports she can stay with cousin at discharge. OT called cousin while in the room and OT explained pt's performance during this session and she reports she herself is disable and unable to provide this level of assistance. She also reports pt with slow processing at baseline. Pt needing mod multimodal cuing for sequencing, initiation, and problem solving with functional tasks. Pt also observed to be holding liquids in mouth and needing increased time to swallow. Pt standing and unable to safely ambulate with B LEs buckling and stand pivot transfer to [facility] for toileting needs.   Patient currently functioning at min - mod A. Patient will benefit from acute OT to increase overall independence in the areas of ADLs, functional mobility, and safety awareness in order to safely discharge to next venue of care.    Follow Up Recommendations  SNF;Supervision/Assistance - 24 hour    Equipment Recommendations  Other (comment);3 in 1 bedside commode (RW)       Precautions / Restrictions Precautions Precautions: Fall Restrictions Weight Bearing Restrictions: No       Mobility Bed Mobility Overal bed mobility: Needs Assistance Bed Mobility: Rolling;Supine to Sit;Sit to Supine Rolling: Supervision   Supine to sit: Min guard Sit to supine: Mod assist   General bed mobility comments: cuing for technique with increased assistance secondary to fatigue    Transfers Overall transfer level: Needs assistance   Transfers: Sit to/from Stand;Stand Pivot Transfers Sit to Stand: Min guard Stand pivot transfers: Mod assist       General transfer comment: B LEs buckle    Balance Overall balance assessment: Needs assistance Sitting-balance support: Feet supported Sitting balance-Leahy Scale: Good Sitting balance - Comments: no LOB     Standing balance-Leahy Scale: Poor                             ADL either performed or assessed with clinical judgement   ADL Overall ADL's : Needs assistance/impaired Eating/Feeding: Minimal assistance;Bed level;Cueing for safety Eating/Feeding Details (indicate cue type and reason): min - mod cuing                 Lower Body Dressing: Minimal assistance;Sitting/lateral leans   Toilet Transfer: Moderate assistance;BSC   Toileting- Clothing Manipulation and Hygiene: Moderate assistance;Sit to/from stand         General ADL Comments: min - mod cuing     Vision Patient Visual Report: No change from baseline              Pertinent Vitals/Pain Pain Assessment: 0-10 Pain Score: 10-Worst pain ever Pain Location: back Pain Descriptors / Indicators: Aching;Discomfort Pain Intervention(s): Limited activity within patient's tolerance;Repositioned  Hand Dominance Right   Extremity/Trunk Assessment Upper Extremity Assessment Upper Extremity Assessment: Generalized weakness   Lower Extremity Assessment Lower Extremity Assessment: Generalized weakness   Cervical / Trunk Assessment Cervical / Trunk Assessment: Normal   Communication Communication Communication: No difficulties    Cognition Arousal/Alertness: Awake/alert Behavior During Therapy: Flat affect Overall Cognitive Status: Impaired/Different from baseline Area of Impairment: Orientation;Following commands;Safety/judgement;Awareness;Problem solving                 Orientation Level: Disoriented to;Time;Situation     Following Commands: Follows one step commands with increased time Safety/Judgement: Decreased awareness of safety Awareness: Emergent Problem Solving: Slow processing;Decreased initiation;Difficulty sequencing;Requires verbal cues General Comments: mod multimodal cuing              Home Living Family/patient expects to be discharged to:: Private residence Living Arrangements: Other relatives Available Help at Discharge: Family;Available PRN/intermittently Type of Home: House Home Access: Stairs to enter [employer] of Steps: 3 Entrance Stairs-Rails: Left;Right;Can reach both Home Layout: One level     Bathroom Shower/Tub: Chief Strategy Officer: Standard Bathroom Accessibility: Yes   Home Equipment: Shower seat          Prior Functioning/Environment Level of Independence: Independent        Comments: per cousin, 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 She reports pt is slow processing at baseline. She has been caregiver for her mother recently until mother's passing. Lives with 2 disabled brothers who are unable to help her        OT Problem List: Decreased strength;Pain;Decreased activity tolerance;Decreased safety awareness;Impaired balance (sitting and/or standing);Decreased knowledge of use of DME or AE;Decreased coordination;Decreased cognition      OT Treatment/Interventions: Self-care/ADL training;Balance training;Therapeutic exercise;Therapeutic activities;Energy conservation;Cognitive remediation/compensation;DME and/or AE instruction;Patient/family education;Neuromuscular education    OT Goals(Current goals can  be found in the care plan section) Acute Rehab OT Goals Patient Stated Goal: to go home with cousin OT Goal Formulation: With patient Time For Goal Achievement: 06/03/20 Potential to Achieve Goals: Good ADL Goals Pt Will Perform Grooming: with modified independence;standing Pt Will Transfer to Toilet: ambulating;with supervision Pt Will Perform Toileting - Clothing Manipulation and hygiene: with supervision;sit to/from stand  OT Frequency: Min 2X/week   Barriers to D/C: Decreased caregiver support  pt lives with diabled brothers who are unable to assist. Cousin reports she can stay with her but unable to provide this level of assist.          AM-PAC OT "6 Clicks" Daily Activity     Outcome Measure Help from another person eating meals?: A Little Help from another person taking care of personal grooming?: A Little Help from another person toileting, which includes using toliet, bedpan, or urinal?: A Lot Help from another person bathing (including washing, rinsing, drying)?: A Lot Help from another person to put on and taking off regular upper body clothing?: A Little Help from another person to put on and taking off regular lower body clothing?: A Lot 6 Click Score: 15   End of Session Equipment Utilized During Treatment:  [facility]) Nurse Communication: Mobility status;Precautions (IV leaking)  Activity Tolerance: Patient tolerated treatment well Patient left: in bed;with call bell/phone within reach;with bed alarm set  OT Visit Diagnosis: Unsteadiness on feet (R26.81);Repeated falls (R29.6);Muscle weakness (generalized) (M62.81)                Time: 1610-9604 OT Time Calculation (min): 44 min Charges:  OT General Charges $OT Visit: 1  Visit OT Evaluation $OT Eval Moderate Complexity: 1 Mod OT Treatments $Self Care/Home Management : 23-37 mins  Darleen Crocker, MS, OTR/L , CBIS ascom [phone]  05/20/20, 1:13 PM

## 2020-05-20 NOTE — Progress Notes (Signed)
PROGRESS NOTE    Stephanie [name]  PFX:[id number] DOB: [birth year] DOA: 05/17/2020 PCP: Pcp, No   Chief complaint.  Shortness of breath Brief Narrative:  Stephanie C Barnettis a 65 y.o.femalewithno significant medical history except for hysterectomy. She reports that she has felt ill for about 3 weeks. Initially she had diarrhea which did clear. However, she has had progressive weakness to the point where it is difficult to manage ADLs and she has had progressive SOB with a non-productive cough. Due to her symptoms she presents to [facility] for evaluation. Upon arriving the hospital, she was found to be Covid positive. Chest x-ray showed patchyinfiltrates. She was started on IV steroids, remdesivir. 1/2.MRI showed acute stroke.  Giving IV Lasix for exacerbation of congestive heart failure.    Assessment & Plan:   Active Problems:   Pneumonia due to COVID-19 virus   Dehydration   Acute ischemic left ACA stroke ([place])   Acute hypoxemic respiratory failure (HCC)  #1. acute hypoxemic respiratory failure. Multilobe pneumonia secondary to COVID-19. Acute on chronic diastolic congestive heart failure ruled in. Patient doing much better today, hypoxia resolved after giving IV Lasix. Repeated CT scan showed patchy groundglass changes, no PE. Since patient has improved, I will change steroids to oral Decadron.  I will continue to finish up the course of baricitinib.  #2.  Acute ischemic stroke. Right ICA aneurysm. Patient is seen by neurology.  Currently on aspirin and Plavix. Patient will need follow-up on ICA aneurysm as outpatient. Patient will also need follow-up with cardiology as outpatient for loop recorder. Patient currently still has significant weakness, physical therapy/Occupational Therapy recommended SNF placement.  3.  Essential hypertension. Continue Norvasc 10 mg daily.  #4.  Acute kidney injury. Secondary to Covid. Renal function has improved.      DVT  prophylaxis: Lovenox Code Status: Full Family Communication:  Disposition Plan:  .   Status is: Inpatient  Remains inpatient appropriate because:Inpatient level of care appropriate due to severity of illness   Dispo: The patient is from: Home              Anticipated d/c is to: SNF              Anticipated d/c date is: 3 days              Patient currently is not medically stable to d/c.        I/O last 3 completed shifts: In: 2491.9 [I.V.:2491.9] Out: 1300 [Urine:1300] No intake/output data recorded.     Consultants:   Neurology  Procedures: None  Antimicrobials:None  Subjective: Patient doing well, she is off oxygen, short of breath essentially improved.   Patient has a significant weakness, difficulty with walking. No abdominal pain or nausea vomiting. No fever or chills. No dysuria or hematuria.  Objective: Vitals:   05/20/20 0319 05/20/20 0507 05/20/20 0656 05/20/20 0847  BP: (!) 168/112 (!) 177/112 (!) 159/122 (!) 151/85  Pulse: 87 (!) 101 78 79  Resp: 18   20  Temp: 97.6 F (36.4 C)   97.7 F (36.5 C)  TempSrc: Oral   Oral  SpO2: 95%   96%  Weight:      Height:        Intake/Output Summary (Last 24 hours) at 05/20/2020 1311 Last data filed at 05/20/2020 0112 Gross per 24 hour  Intake 2491.94 ml  Output 800 ml  Net 1691.94 ml   Filed Weights   05/17/20 2141  Weight: 65.8 kg  Examination:  General exam: Appears calm and comfortable  Respiratory system: Clear to auscultation. Respiratory effort normal. Cardiovascular system: S1 & S2 heard, RRR. No JVD, murmurs, rubs, gallops or clicks. No pedal edema. Gastrointestinal system: Abdomen is nondistended, soft and nontender. No organomegaly or masses felt. Normal bowel sounds heard. Central nervous system: Alert and oriented. No focal neurological deficits. Extremities: Symmetric 5 x 5 power. Skin: No rashes, lesions or ulcers Psychiatry: Mood & affect appropriate.     Data Reviewed: I  have personally reviewed following labs and imaging studies  CBC: Recent Labs  Lab 05/17/20 2206 05/19/20 0515 05/20/20 0412  WBC 5.6 10.4 11.5*  NEUTROABS 4.4 8.4* 9.5*  HGB 13.5 13.1 14.3  HCT 40.6 40.0 42.6  MCV 86.2 88.5 84.0  PLT 151 196 286   Basic Metabolic Panel: Recent Labs  Lab 05/17/20 2206 05/19/20 0515 05/20/20 0412  NA 138 136 138  K 3.8 3.9 3.5  CL 100 107 100  CO2 26 23 26   GLUCOSE 121* 141* 141*  BUN 29* 28* 27*  CREATININE 1.52* 0.97 1.10*  CALCIUM 10.5* 9.9 10.6*  MG 2.1 1.8 1.7   GFR: Estimated Creatinine Clearance: 54.4 mL/min (A) (by C-G formula based on SCr of 1.1 mg/dL (H)). Liver Function Tests: Recent Labs  Lab 05/17/20 2206 05/19/20 0515 05/20/20 0412  AST 24 19 16   ALT 15 13 15   ALKPHOS 56 51 64  BILITOT 0.9 0.5 0.7  PROT 7.4 6.1* 7.3  ALBUMIN 3.6 2.9* 3.3*   No results for input(s): LIPASE, AMYLASE in the last 168 hours. Recent Labs  Lab 05/17/20 2231  AMMONIA 11   Coagulation Profile: Recent Labs  Lab 05/17/20 2206  INR 1.0   Cardiac Enzymes: Recent Labs  Lab 05/17/20 2206  CKTOTAL 38   BNP (last 3 results) No results for input(s): PROBNP in the last 8760 hours. HbA1C: Recent Labs    05/18/20 0206  HGBA1C 5.4   CBG: Recent Labs  Lab 05/19/20 1347 05/19/20 1659 05/19/20 2103 05/20/20 0826 05/20/20 1159  GLUCAP 119* 128* 153* 146* 135*   Lipid Profile: Recent Labs    05/18/20 0206  CHOL 126  HDL 48  LDLCALC 59  TRIG 95  CHOLHDL 2.6   Thyroid Function Tests: Recent Labs    05/17/20 2206  TSH 1.259   Anemia Panel: Recent Labs    05/18/20 0206  FERRITIN 545*   Sepsis Labs: Recent Labs  Lab 05/17/20 2206 05/19/20 0515 05/20/20 0412  PROCALCITON 0.19 <0.10 0.16    Recent Results (from the past 240 hour(s))  Resp Panel by RT-PCR (Flu A&B, Covid) Nasopharyngeal Swab     Status: Abnormal   Collection Time: 05/17/20 10:31 PM   Specimen: Nasopharyngeal Swab; Nasopharyngeal(NP) swabs  in vial transport medium  Result Value Ref Range Status   SARS Coronavirus 2 by RT PCR POSITIVE (A) NEGATIVE Final    Comment: RESULT CALLED TO, READ BACK BY AND VERIFIED WITH: jenna elington @2350  on 05/17/20 skl (NOTE) SARS-CoV-2 target nucleic acids are DETECTED.  The SARS-CoV-2 RNA is generally detectable in upper respiratory specimens during the acute phase of infection. Positive results are indicative of the presence of the identified virus, but do not rule out bacterial infection or co-infection with other pathogens not detected by the test. Clinical correlation with patient history and other diagnostic information is necessary to determine patient infection status. The expected result is Negative.  Fact Sheet for Patients: BloggerCourse.com  Fact Sheet for Healthcare Providers: SeriousBroker.it  This test is not yet approved or cleared by the [place] and  has been authorized for detection and/or diagnosis of SARS-CoV-2 by FDA under an Emergency Use Authorization (EUA).  This EUA will remain in effect (meaning this test can  be used) for the duration of  the COVID-19 declaration under Section 564(b)(1) of the Act, 21 U.S.C. section 360bbb-3(b)(1), unless the authorization is terminated or revoked sooner.     Influenza A by PCR NEGATIVE NEGATIVE Final   Influenza B by PCR NEGATIVE NEGATIVE Final    Comment: (NOTE) The Xpert Xpress SARS-CoV-2/FLU/RSV plus assay is intended as an aid in the diagnosis of influenza from Nasopharyngeal swab specimens and should not be used as a sole basis for treatment. Nasal washings and aspirates are unacceptable for Xpert Xpress SARS-CoV-2/FLU/RSV testing.  Fact Sheet for Patients: BloggerCourse.com  Fact Sheet for Healthcare Providers: SeriousBroker.it  This test is not yet approved or cleared by the [place] FDA and has  been authorized for detection and/or diagnosis of SARS-CoV-2 by FDA under an Emergency Use Authorization (EUA). This EUA will remain in effect (meaning this test can be used) for the duration of the COVID-19 declaration under Section 564(b)(1) of the Act, 21 U.S.C. section 360bbb-3(b)(1), unless the authorization is terminated or revoked.  Performed at [facility], [address] [place], [place] [address]   Blood culture (routine x 2)     Status: None (Preliminary result)   Collection Time: 05/17/20 10:50 PM   Specimen: BLOOD  Result Value Ref Range Status   Specimen Description BLOOD BLOOD RIGHT FOREARM  Final   Special Requests   Final    BOTTLES DRAWN AEROBIC AND ANAEROBIC Blood Culture results may not be optimal due to an excessive volume of blood received in culture bottles   Culture   Final    NO GROWTH 3 DAYS Performed at [facility], [address], [place], [place] [address]    Report Status PENDING  Incomplete  Blood culture (routine x 2)     Status: None (Preliminary result)   Collection Time: 05/17/20 10:55 PM   Specimen: BLOOD  Result Value Ref Range Status   Specimen Description BLOOD BLOOD LEFT FOREARM  Final   Special Requests   Final    BOTTLES DRAWN AEROBIC AND ANAEROBIC Blood Culture adequate volume   Culture   Final    NO GROWTH 3 DAYS Performed at [facility], [address] [place], [place] [address]    Report Status PENDING  Incomplete         Radiology Studies: CT Code Stroke CTA Head W/WO contrast  Result Date: 05/19/2020 CLINICAL DATA:  Possible aneurysm on MRA EXAM: CT ANGIOGRAPHY HEAD AND NECK TECHNIQUE: Multidetector CT imaging of the head and neck was performed using the standard protocol during bolus administration of intravenous contrast. Multiplanar CT image reconstructions and MIPs were obtained to evaluate the vascular anatomy. Carotid stenosis measurements (when applicable) are obtained utilizing NASCET  criteria, using the distal internal carotid diameter as the denominator. CONTRAST:  76mL OMNIPAQUE IOHEXOL 350 MG/ML SOLN COMPARISON:  MRI and MRA 05/18/2020 FINDINGS: CT HEAD Brain: There is no acute intracranial hemorrhage, mass effect, or edema. Gray-white differentiation is preserved. Small stroke on MRI is not visible. Patchy and confluent areas of hypoattenuation in the supratentorial white matter likely reflect chronic microvascular ischemic changes. There is no extra-axial fluid collection. Prominence of the ventricles and sulci reflects minor generalized parenchymal volume loss. Vascular: There is  atherosclerotic calcification at the skull base. Skull: Calvarium is unremarkable. Sinuses/Orbits: No acute finding. Other: None. Review of the MIP images confirms the above findings CTA NECK Aortic arch: Great vessel origins are patent. Right carotid system: Patent. No measurable stenosis at the ICA origin. Left carotid system: Patent. No measurable stenosis at the ICA origin. Vertebral arteries: Patent. Left vertebral artery is slightly dominant. No measurable stenosis. Skeleton: Degenerative changes of the cervical spine. Large periapical lucency about remaining left mandibular molar. Other neck: No mass or adenopathy. Upper chest: Refer to concurrent dedicated chest imaging. Review of the MIP images confirms the above findings CTA HEAD Anterior circulation: Intracranial internal carotid arteries are patent. There are bilateral inferiorly directed outpouching incorporating the origins of the posterior communicating arteries. Both measure approximately 3 x 3 mm. There is also a 1.4 x 1.2 x 1.4 cm structure with calcification at the periphery along the medial aspect of the cavernous right carotid. This encroaches on the sella with leftward displacement of the pituitary. A diminutive focus of enhancement extends medially from the cavernous carotid into the structure. Anterior cerebral arteries are patent. Probable  duplication of the anterior communicating artery. Middle cerebral arteries are patent. Posterior circulation: Intracranial vertebral arteries, basilar artery, and posterior cerebral arteries are patent. Bilateral posterior communicating arteries are present. Venous sinuses: Patent as allowed by contrast bolus timing. Review of the MIP images confirms the above findings IMPRESSION: Bilateral 3 mm outpouchings at the posterior communicating artery origins reflecting infundibula or aneurysms. 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 There is likely trace residual filling at the neck. No hemodynamically significant stenosis in the neck. No acute intracranial abnormality. Small stroke on MRI is not visible. Electronically Signed   By: Guadlupe Spanish M.D.   On: 05/19/2020 16:02   DG Chest 1 View  Result Date: 05/19/2020 CLINICAL DATA:  Recent COVID-19 positive EXAM: CHEST  1 VIEW COMPARISON:  May 17, 2020 FINDINGS: There is ill-defined opacity in the right mid lung and bibasilar regions, similar to 2 days prior. No new opacity evident. No consolidation. Heart size and pulmonary vascularity are normal. No adenopathy. Aorta is somewhat tortuous, stable. No bone lesions. IMPRESSION: Ill-defined airspace opacity right mid lung and bibasilar regions, likely due to atypical organism pneumonia. No new opacity. Stable cardiac silhouette. Electronically Signed   By: Bretta Bang III M.D.   On: 05/19/2020 08:25   CT Code Stroke CTA Neck W/WO contrast  Result Date: 05/19/2020 CLINICAL DATA:  Possible aneurysm on MRA EXAM: CT ANGIOGRAPHY HEAD AND NECK TECHNIQUE: Multidetector CT imaging of the head and neck was performed using the standard protocol during bolus administration of intravenous contrast. Multiplanar CT image reconstructions and MIPs were obtained to evaluate the vascular anatomy. Carotid stenosis measurements (when applicable) are obtained utilizing NASCET criteria, using the distal  internal carotid diameter as the denominator. CONTRAST:  1mL OMNIPAQUE IOHEXOL 350 MG/ML SOLN COMPARISON:  MRI and MRA 05/18/2020 FINDINGS: CT HEAD Brain: There is no acute intracranial hemorrhage, mass effect, or edema. Gray-white differentiation is preserved. Small stroke on MRI is not visible. Patchy and confluent areas of hypoattenuation in the supratentorial white matter likely reflect chronic microvascular ischemic changes. There is no extra-axial fluid collection. Prominence of the ventricles and sulci reflects minor generalized parenchymal volume loss. Vascular: There is atherosclerotic calcification at the skull base. Skull: Calvarium is unremarkable. Sinuses/Orbits: No acute finding. Other: None. Review of the MIP images confirms the above findings CTA NECK Aortic arch: Great vessel  origins are patent. Right carotid system: Patent. No measurable stenosis at the ICA origin. Left carotid system: Patent. No measurable stenosis at the ICA origin. Vertebral arteries: Patent. Left vertebral artery is slightly dominant. No measurable stenosis. Skeleton: Degenerative changes of the cervical spine. Large periapical lucency about remaining left mandibular molar. Other neck: No mass or adenopathy. Upper chest: Refer to concurrent dedicated chest imaging. Review of the MIP images confirms the above findings CTA HEAD Anterior circulation: Intracranial internal carotid arteries are patent. There are bilateral inferiorly directed outpouching incorporating the origins of the posterior communicating arteries. Both measure approximately 3 x 3 mm. There is also a 1.4 x 1.2 x 1.4 cm structure with calcification at the periphery along the medial aspect of the cavernous right carotid. This encroaches on the sella with leftward displacement of the pituitary. A diminutive focus of enhancement extends medially from the cavernous carotid into the structure. Anterior cerebral arteries are patent. Probable duplication of the  anterior communicating artery. Middle cerebral arteries are patent. Posterior circulation: Intracranial vertebral arteries, basilar artery, and posterior cerebral arteries are patent. Bilateral posterior communicating arteries are present. Venous sinuses: Patent as allowed by contrast bolus timing. Review of the MIP images confirms the above findings IMPRESSION: Bilateral 3 mm outpouchings at the posterior communicating artery origins reflecting infundibula or aneurysms. 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 There is likely trace residual filling at the neck. No hemodynamically significant stenosis in the neck. No acute intracranial abnormality. Small stroke on MRI is not visible. Electronically Signed   By: Guadlupe Spanish M.D.   On: 05/19/2020 16:02   CT ANGIO CHEST PE W OR WO CONTRAST  Result Date: 05/19/2020 CLINICAL DATA:  Shortness of breath. EXAM: CT ANGIOGRAPHY CHEST WITH CONTRAST TECHNIQUE: Multidetector CT imaging of the chest was performed using the standard protocol during bolus administration of intravenous contrast. Multiplanar CT image reconstructions and MIPs were obtained to evaluate the vascular anatomy. CONTRAST:  33mL OMNIPAQUE IOHEXOL 350 MG/ML SOLN COMPARISON:  None. FINDINGS: Cardiovascular: Satisfactory opacification of the pulmonary arteries to the segmental level. No evidence of pulmonary embolism. Normal heart size. No pericardial effusion. Mediastinum/Nodes: No enlarged mediastinal, hilar, or axillary lymph nodes. Thyroid gland, trachea, and esophagus demonstrate no significant findings. Lungs/Pleura: No pneumothorax or pleural effusion is noted. Right lower lobe atelectasis or pneumonia is noted. Patchy airspace opacities are noted throughout the other lobes bilaterally suggesting subsegmental atelectasis or multifocal inflammation. Upper Abdomen: No acute abnormality. Musculoskeletal: No chest wall abnormality. No acute or significant osseous findings.  Review of the MIP images confirms the above findings. IMPRESSION: 1. No definite evidence of pulmonary embolus. 2. Right lower lobe atelectasis or pneumonia is noted. Patchy airspace opacities are noted throughout the other lobes bilaterally suggesting subsegmental atelectasis or multifocal inflammation. Electronically Signed   By: Lupita Raider M.D.   On: 05/19/2020 15:54   MR ANGIO HEAD WO CONTRAST  Result Date: 05/18/2020 CLINICAL DATA:  Stroke.  Possible aneurysm right cavernous carotid. EXAM: MRA NECK WITHOUT AND WITH CONTRAST MRA HEAD WITHOUT CONTRAST TECHNIQUE: Multiplanar and multiecho pulse sequences of the neck were obtained without and with intravenous contrast. Angiographic images of the neck were obtained using MRA technique without and with intravenous contrast; Angiographic images of the Circle of Willis were obtained using MRA technique without intravenous contrast. CONTRAST:  55mL GADAVIST GADOBUTROL 1 MMOL/ML IV SOLN COMPARISON:  MRI head 05/18/2020.  CT head 05/17/2020 FINDINGS: MRA NECK FINDINGS Postcontrast images are nondiagnostic as they are  venous phase. Unenhanced time-of-flight demonstrates antegrade flow in the carotid and vertebral arteries. The carotid bifurcation appears patent bilaterally without stenosis. Both vertebral arteries are patent. MRA HEAD FINDINGS The internal carotid artery is widely patent bilaterally. There is an outpouching measuring approximately 3 mm involving the region the posterior communicating artery bilaterally. Possible infundibulum versus aneurysm. The larger abnormality in the right cavernous sinus with calcific wall does not have flow related signal on MRA however could be a thrombosed aneurysm. Anterior and middle cerebral arteries widely patent bilaterally Posterior circulation widely patent without stenosis or aneurysm IMPRESSION: 1. 3 mm outpouching in the region of the posterior communicating artery bilaterally. Possible infundibulum versus  aneurysm. CTA may be helpful. 2. The larger 14 mm abnormality in the right cavernous sinus with calcific wall on CT does not have flow related signal but could be a thrombosed aneurysm. CTA head recommended for further evaluation. 3. Limited MRA neck. No significant carotid or vertebral artery stenosis. Electronically Signed   By: Marlan Palauharles  Clark M.D.   On: 05/18/2020 14:39   MR ANGIO NECK W WO CONTRAST  Result Date: 05/18/2020 CLINICAL DATA:  Stroke.  Possible aneurysm right cavernous carotid. EXAM: MRA NECK WITHOUT AND WITH CONTRAST MRA HEAD WITHOUT CONTRAST TECHNIQUE: Multiplanar and multiecho pulse sequences of the neck were obtained without and with intravenous contrast. Angiographic images of the neck were obtained using MRA technique without and with intravenous contrast; Angiographic images of the Circle of Willis were obtained using MRA technique without intravenous contrast. CONTRAST:  5mL GADAVIST GADOBUTROL 1 MMOL/ML IV SOLN COMPARISON:  MRI head 05/18/2020.  CT head 05/17/2020 FINDINGS: MRA NECK FINDINGS Postcontrast images are nondiagnostic as they are venous phase. Unenhanced time-of-flight demonstrates antegrade flow in the carotid and vertebral arteries. The carotid bifurcation appears patent bilaterally without stenosis. Both vertebral arteries are patent. MRA HEAD FINDINGS The internal carotid artery is widely patent bilaterally. There is an outpouching measuring approximately 3 mm involving the region the posterior communicating artery bilaterally. Possible infundibulum versus aneurysm. The larger abnormality in the right cavernous sinus with calcific wall does not have flow related signal on MRA however could be a thrombosed aneurysm. Anterior and middle cerebral arteries widely patent bilaterally Posterior circulation widely patent without stenosis or aneurysm IMPRESSION: 1. 3 mm outpouching in the region of the posterior communicating artery bilaterally. Possible infundibulum versus  aneurysm. CTA may be helpful. 2. The larger 14 mm abnormality in the right cavernous sinus with calcific wall on CT does not have flow related signal but could be a thrombosed aneurysm. CTA head recommended for further evaluation. 3. Limited MRA neck. No significant carotid or vertebral artery stenosis. Electronically Signed   By: Marlan Palauharles  Clark M.D.   On: 05/18/2020 14:39   ECHOCARDIOGRAM COMPLETE  Result Date: 05/19/2020    ECHOCARDIOGRAM REPORT   Patient Name:   Daryll DrownJEWEL C Lizana Date of Exam: 05/19/2020 Medical Rec #:  [id number]       Height:       72.0 in Accession #:    [id number]      Weight:       145.0 lb Date of Birth:  [birth year]       BSA:          1.858 m Patient Age:    64 years        BP:           150/84 mmHg Patient Gender: F  HR:           70 bpm. Exam Location:  [facility] Procedure: 2D Echo, Cardiac Doppler and Color Doppler Indications:     Stroke 434.91  History:         Patient has no prior history of Echocardiogram examinations.                  Risk Factors:Diabetes.  Sonographer:     Cristela BlueJerry Hege RDCS (AE) Referring Phys:  [id number] Gordy CouncilmanSRISHTI L BHAGAT Diagnosing Phys: Lorine BearsMuhammad Arida MD  Sonographer Comments: No subcostal window. IMPRESSIONS  1. Left ventricular ejection fraction, by estimation, is 55 to 60%. The left ventricle has normal function. The left ventricle has no regional wall motion abnormalities. There is mild left ventricular hypertrophy. Left ventricular diastolic parameters are consistent with Grade I diastolic dysfunction (impaired relaxation).  2. Right ventricular systolic function is normal. The right ventricular size is normal. Tricuspid regurgitation signal is inadequate for assessing PA pressure.  3. Left atrial size was mildly dilated.  4. The mitral valve is normal in structure. No evidence of mitral valve regurgitation. No evidence of mitral stenosis.  5. The aortic valve is normal in structure. Aortic valve regurgitation is not visualized. No aortic stenosis is  present. FINDINGS  Left Ventricle: Left ventricular ejection fraction, by estimation, is 55 to 60%. The left ventricle has normal function. The left ventricle has no regional wall motion abnormalities. The left ventricular internal cavity size was normal in size. There is  mild left ventricular hypertrophy. Left ventricular diastolic parameters are consistent with Grade I diastolic dysfunction (impaired relaxation). Right Ventricle: The right ventricular size is normal. No increase in right ventricular wall thickness. Right ventricular systolic function is normal. Tricuspid regurgitation signal is inadequate for assessing PA pressure. Left Atrium: Left atrial size was mildly dilated. Right Atrium: Right atrial size was normal in size. Pericardium: There is no evidence of pericardial effusion. Mitral Valve: The mitral valve is normal in structure. No evidence of mitral valve regurgitation. No evidence of mitral valve stenosis. Tricuspid Valve: The tricuspid valve is normal in structure. Tricuspid valve regurgitation is not demonstrated. No evidence of tricuspid stenosis. Aortic Valve: The aortic valve is normal in structure. Aortic valve regurgitation is not visualized. No aortic stenosis is present. Aortic valve mean gradient measures 6.5 mmHg. Aortic valve peak gradient measures 10.0 mmHg. Aortic valve area, by VTI measures 2.50 cm. Pulmonic Valve: The pulmonic valve was normal in structure. Pulmonic valve regurgitation is not visualized. No evidence of pulmonic stenosis. Aorta: The aortic root is normal in size and structure. Venous: The inferior vena cava was not well visualized. IAS/Shunts: No atrial level shunt detected by color flow Doppler.  LEFT VENTRICLE PLAX 2D LVIDd:         4.71 cm  Diastology LVIDs:         3.35 cm  LV e' medial:    5.11 cm/s LV PW:         1.30 cm  LV E/e' medial:  14.2 LV IVS:        0.93 cm  LV e' lateral:   4.35 cm/s LVOT diam:     2.00 cm  LV E/e' lateral: 16.7 LV SV:         78  LV SV Index:   42 LVOT Area:     3.14 cm  RIGHT VENTRICLE RV Basal diam:  4.15 cm RV S prime:     16.30 cm/s TAPSE (M-mode): 4.3 cm LEFT ATRIUM  Index       RIGHT ATRIUM           Index LA diam:      4.60 cm 2.48 cm/m  RA Area:     17.90 cm LA Vol (A2C): 58.5 ml 31.48 ml/m RA Volume:   46.30 ml  24.91 ml/m LA Vol (A4C): 66.7 ml 35.89 ml/m  AORTIC VALVE                    PULMONIC VALVE AV Area (Vmax):    2.40 cm     PV Vmax:        0.80 m/s AV Area (Vmean):   2.11 cm     PV Peak grad:   2.5 mmHg AV Area (VTI):     2.50 cm     RVOT Peak grad: 3 mmHg AV Vmax:           158.50 cm/s AV Vmean:          119.000 cm/s AV VTI:            0.313 m AV Peak Grad:      10.0 mmHg AV Mean Grad:      6.5 mmHg LVOT Vmax:         121.00 cm/s LVOT Vmean:        79.800 cm/s LVOT VTI:          0.249 m LVOT/AV VTI ratio: 0.80  AORTA Ao Root diam: 3.37 cm MITRAL VALVE                TRICUSPID VALVE MV Area (PHT): 2.50 cm     TR Peak grad:   20.8 mmHg MV Decel Time: 303 msec     TR Vmax:        228.00 cm/s MV E velocity: 72.80 cm/s MV A velocity: 101.00 cm/s  SHUNTS MV E/A ratio:  0.72         Systemic VTI:  0.25 m                             Systemic Diam: 2.00 cm Lorine Bears MD Electronically signed by Lorine Bears MD Signature Date/Time: 05/19/2020/5:08:10 PM    Final         Scheduled Meds: . amLODipine  10 mg Oral Daily  . aspirin EC  81 mg Oral Daily  . clopidogrel  75 mg Oral Daily  . dexamethasone  6 mg Oral Daily  . enoxaparin (LOVENOX) injection  40 mg Subcutaneous Q24H  . influenza vac split quadrivalent PF  0.5 mL Intramuscular Tomorrow-1000  . insulin aspart  0-15 Units Subcutaneous TID WC  . insulin detemir  0.075 Units/kg Subcutaneous BID  . Ipratropium-Albuterol  1 puff Inhalation Q6H  . senna  1 tablet Oral BID   Continuous Infusions: . remdesivir 100 mg in NS 100 mL Stopped (05/18/20 1003)     LOS: 2 days    Time spent: 28 minutes    Marrion Coy, MD [facility]   To contact the attending provider between 7A-7P or the covering provider during after hours 7P-7A, please log into the web site [email or web address] and access using universal [place] password for that web site. If you do not have the password, please call the hospital operator.  05/20/2020, 1:11 PM

## 2020-05-20 NOTE — Progress Notes (Signed)
Secure chat to Dr. Chipper Herb, patient c/o 10/10 back pain, patient refusing to try PRN Tylenol.

## 2020-05-20 NOTE — Evaluation (Signed)
Physical Therapy Evaluation Patient Details Name: Stephanie [name] MRN: [id number] DOB: 12-Jan-1956 Today's Date: 05/20/2020   History of Present Illness  65 y.o. female with no significant medical history except for hysterectomy. She reports that she has felt ill for about 3 weeks. Initially she had diarrhea which did clear. However, she has had progressive weakness to the point where it is difficult to manage ADLs and she has had progressive SOB with a non-productive cough. Due to her symptoms she presents to [facility] for evaluation.     Clinical Impression  Pt received in Semi-Fowler's position and agreeable to therapy.  Pt reports severe back pain upon entering into the room.  Pt educated on importance of movement in order to alleviate back pain.  Pt was agreeable to sitting at EOB and performing transfer training.  Pt was able to perform STS x5, with therapeutic exercises in between bouts including LAQ's, seated/standing marches, and hamstring curls.  Pt did fatigue quickly and was unable to ambulate a good distance before knees were starting to buckle.  Pt was then placed back in bed with modA for LE navigation.  Call bell and all other needs within reach.  Due to current physical level of patient and the professional requirement of therapy needed, pt will be recommended to a STR facility.  Pt will benefit from skilled PT intervention to increase independence and safety with basic mobility in preparation for discharge to the venue listed below.       Follow Up Recommendations SNF    Equipment Recommendations  Rolling walker with 5" wheels;3in1 (PT)    Recommendations for Other Services       Precautions / Restrictions Precautions Precautions: Fall Restrictions Weight Bearing Restrictions: No      Mobility  Bed Mobility Overal bed mobility: Needs Assistance Bed Mobility: Rolling;Supine to Sit;Sit to Supine Rolling: Supervision   Supine to sit: Min guard Sit to supine: Mod  assist   General bed mobility comments: cuing for technique with increased assistance secondary to fatigue    Transfers Overall transfer level: Needs assistance   Transfers: Sit to/from Stand Sit to Stand: Min guard;Min assist;Mod assist Stand pivot transfers: Mod assist       General transfer comment: B LEs buckle  Ambulation/Gait Ambulation/Gait assistance: Min guard Gait Distance (Feet): 4 Feet Assistive device: Rolling walker (2 wheeled)   Gait velocity: decreased   General Gait Details: side-stepping at EOB for safety  Stairs            Wheelchair Mobility    Modified Rankin (Stroke Patients Only)       Balance Overall balance assessment: Needs assistance Sitting-balance support: Feet supported Sitting balance-Leahy Scale: Good Sitting balance - Comments: no LOB   Standing balance support: Bilateral upper extremity supported Standing balance-Leahy Scale: Poor Standing balance comment: Pt able to remain upright but requires B UE support on FWW.                             Pertinent Vitals/Pain Pain Assessment: 0-10 Pain Score: 10-Worst pain ever Pain Location: back Pain Descriptors / Indicators: Aching;Discomfort Pain Intervention(s): Limited activity within patient's tolerance;Monitored during session;Repositioned;RN gave pain meds during session    Home Living Family/patient expects to be discharged to:: Private residence Living Arrangements: Other relatives Available Help at Discharge: Family;Available PRN/intermittently Type of Home: House Home Access: Stairs to enter Entrance Stairs-Rails: Left;Right;Can reach both Entrance Stairs-Number of Steps: 3 Home Layout: One  level Home Equipment: Shower seat      Prior Function Level of Independence: Independent         Comments: per cousin, 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 She reports pt is slow processing at baseline. She has been caregiver for her mother  recently until mother's passing. Lives with 2 disabled brothers who are unable to help her     Hand Dominance   Dominant Hand: Right    Extremity/Trunk Assessment   Upper Extremity Assessment Upper Extremity Assessment: Generalized weakness    Lower Extremity Assessment Lower Extremity Assessment: Generalized weakness    Cervical / Trunk Assessment Cervical / Trunk Assessment: Normal  Communication   Communication: No difficulties  Cognition Arousal/Alertness: Awake/alert Behavior During Therapy: Flat affect Overall Cognitive Status: Impaired/Different from baseline Area of Impairment: Orientation;Following commands;Safety/judgement;Awareness;Problem solving                 Orientation Level: Disoriented to;Time;Situation     Following Commands: Follows one step commands with increased time Safety/Judgement: Decreased awareness of safety Awareness: Emergent Problem Solving: Slow processing;Decreased initiation;Difficulty sequencing;Requires verbal cues General Comments: mod multimodal cuing      General Comments      Exercises Other Exercises Other Exercises: Pt educated on roles of PT and services provided during hospital stay.  Pt also educated on importance of mobility in order to decrease back pain.   Assessment/Plan    PT Assessment Patient needs continued PT services  PT Problem List Decreased strength;Decreased activity tolerance;Decreased balance;Decreased mobility;Decreased safety awareness;Pain       PT Treatment Interventions DME instruction;Gait training;Stair training;Functional mobility training;Therapeutic activities;Therapeutic exercise;Balance training;Neuromuscular re-education    PT Goals (Current goals can be found in the Care Plan section)  Acute Rehab PT Goals Patient Stated Goal: to go home with cousin PT Goal Formulation: With patient Time For Goal Achievement: 06/03/20 Potential to Achieve Goals: Poor    Frequency Min  2X/week   Barriers to discharge Decreased caregiver support Pt does not currently have a place to go where she would have adequate caregiver support.    Co-evaluation               AM-PAC PT "6 Clicks" Mobility  Outcome Measure Help needed turning from your back to your side while in a flat bed without using bedrails?: A Little Help needed moving from lying on your back to sitting on the side of a flat bed without using bedrails?: A Little Help needed moving to and from a bed to a chair (including a wheelchair)?: A Little Help needed standing up from a chair using your arms (e.g., wheelchair or bedside chair)?: A Lot Help needed to walk in hospital room?: A Lot Help needed climbing 3-5 steps with a railing? : A Lot 6 Click Score: 15    End of Session Equipment Utilized During Treatment: Gait belt Activity Tolerance: Patient tolerated treatment well;Patient limited by pain Patient left: in bed;with call bell/phone within reach;with bed alarm set Nurse Communication: Mobility status PT Visit Diagnosis: Unsteadiness on feet (R26.81);Other abnormalities of gait and mobility (R26.89);Muscle weakness (generalized) (M62.81);History of falling (Z91.81);Difficulty in walking, not elsewhere classified (R26.2);Pain Pain - part of body:  (Back)    Time: 1530-1606 PT Time Calculation (min) (ACUTE ONLY): 36 min   Charges:   PT Evaluation $PT Eval Low Complexity: 1 Low PT Treatments $Gait Training: 8-22 mins $Therapeutic Exercise: 8-22 mins        Nolon Bussing, PT, DPT 05/20/20, 4:44  PM   

## 2020-05-21 DIAGNOSIS — R531 Weakness: Secondary | ICD-10-CM

## 2020-05-21 LAB — COMPREHENSIVE METABOLIC PANEL
ALT: 12 U/L (ref 0–44)
AST: 14 U/L — ABNORMAL LOW (ref 15–41)
Albumin: 3 g/dL — ABNORMAL LOW (ref 3.5–5.0)
Alkaline Phosphatase: 55 U/L (ref 38–126)
Anion gap: 10 (ref 5–15)
BUN: 33 mg/dL — ABNORMAL HIGH (ref 8–23)
CO2: 25 mmol/L (ref 22–32)
Calcium: 10.4 mg/dL — ABNORMAL HIGH (ref 8.9–10.3)
Chloride: 104 mmol/L (ref 98–111)
Creatinine, Ser: 1.06 mg/dL — ABNORMAL HIGH (ref 0.44–1.00)
GFR, Estimated: 59 mL/min — ABNORMAL LOW (ref >60)
Glucose, Bld: 121 mg/dL — ABNORMAL HIGH (ref 70–99)
Potassium: 3.6 mmol/L (ref 3.5–5.1)
Sodium: 139 mmol/L (ref 135–145)
Total Bilirubin: 0.8 mg/dL (ref 0.3–1.2)
Total Protein: 6.5 g/dL (ref 6.5–8.1)

## 2020-05-21 LAB — CBC WITH DIFFERENTIAL/PLATELET
Abs Immature Granulocytes: 0.17 10*3/uL — ABNORMAL HIGH (ref 0.00–0.07)
Basophils Absolute: 0 10*3/uL (ref 0.0–0.1)
Basophils Relative: 0 %
Eosinophils Absolute: 0 10*3/uL (ref 0.0–0.5)
Eosinophils Relative: 0 %
HCT: 38.3 % (ref 36.0–46.0)
Hemoglobin: 12.7 g/dL (ref 12.0–15.0)
Immature Granulocytes: 2 %
Lymphocytes Relative: 7 %
Lymphs Abs: 0.7 10*3/uL (ref 0.7–4.0)
MCH: 27.7 pg (ref 26.0–34.0)
MCHC: 33.2 g/dL (ref 30.0–36.0)
MCV: 83.6 fL (ref 80.0–100.0)
Monocytes Absolute: 1 10*3/uL (ref 0.1–1.0)
Monocytes Relative: 10 %
Neutro Abs: 7.8 10*3/uL — ABNORMAL HIGH (ref 1.7–7.7)
Neutrophils Relative %: 81 %
Platelets: 262 10*3/uL (ref 150–400)
RBC: 4.58 MIL/uL (ref 3.87–5.11)
RDW: 13 % (ref 11.5–15.5)
Smear Review: NORMAL
WBC: 9.7 10*3/uL (ref 4.0–10.5)
nRBC: 0 % (ref 0.0–0.2)

## 2020-05-21 LAB — GLUCOSE, CAPILLARY
Glucose-Capillary: 112 mg/dL — ABNORMAL HIGH (ref 70–99)
Glucose-Capillary: 117 mg/dL — ABNORMAL HIGH (ref 70–99)
Glucose-Capillary: 147 mg/dL — ABNORMAL HIGH (ref 70–99)
Glucose-Capillary: 127 mg/dL — ABNORMAL HIGH (ref 70–99)

## 2020-05-21 LAB — C-REACTIVE PROTEIN: CRP: 2.8 mg/dL — ABNORMAL HIGH (ref <1.0)

## 2020-05-21 MED ORDER — ASCORBIC ACID 500 MG PO TABS
500.0000 mg | ORAL_TABLET | Freq: Every day | ORAL | Status: DC
  Administered 2020-05-21 – 2020-05-29 (×9): 500 mg via ORAL
  Filled 2020-05-21 (×9): qty 1

## 2020-05-21 MED ORDER — ZINC SULFATE 220 (50 ZN) MG PO CAPS
220.0000 mg | ORAL_CAPSULE | Freq: Every day | ORAL | Status: DC
  Administered 2020-05-21 – 2020-05-29 (×7): 220 mg via ORAL
  Filled 2020-05-21 (×10): qty 1

## 2020-05-21 MED ORDER — SODIUM CHLORIDE 0.9 % IV SOLN
100.0000 mg | Freq: Once | INTRAVENOUS | Status: AC
  Administered 2020-05-22: 100 mg via INTRAVENOUS
  Filled 2020-05-21: qty 100

## 2020-05-21 NOTE — Progress Notes (Signed)
PROGRESS NOTE    Stephanie DrownJewel C [name]  ZOX:[id number] DOB: [birth year] DOA: 05/17/2020 PCP: Pcp, No     Brief Narrative:  65 y.o.BF PMHx Hysterectomy.She reports that she has felt ill for about 3 weeks. Initially she had diarrhea which did clear. However, she has had progressive weakness to the point where it is difficult to manage ADLs and she has had progressive SOB with a non-productive cough. Due to her symptoms she presents to [facility] for evaluation. Upon arriving the hospital, she was found to be Covid positive. Chest x-ray showed patchyinfiltrates. She was started on IV steroids, remdesivir. 1/2.MRI showed acute stroke.  Giving IV Lasix for exacerbation of congestive heart failure.   Subjective: Afebrile overnight slurred speech   Assessment & Plan: Covid vaccination; unvaccinated   Active Problems:   Pneumonia due to COVID-19 virus   Dehydration   Acute ischemic left ACA stroke (HCC)   Acute hypoxemic respiratory failure (HCC)  Acute respiratory failure with hypoxia/Covid 19 pneumonia COVID-19 Labs  Recent Labs    05/19/20 0515 05/20/20 0412 05/21/20 0351  CRP 6.2* 5.7* 2.8*    Lab Results  Component Value Date   SARSCOV2NAA POSITIVE (A) 05/17/2020  -Multilobe pneumonia secondary to COVID-19. -Remdesivir per pharmacy protocol -Decadron 6 mg daily -Vitamins per Covid protocol  Acute on chronic CHF  -Ruled in  -Strict in and out -Daily weight -Amlodipine 10 mg daily  Essential HTN -See CHF  Acute ischemic stroke Right ICA aneurysm. Patient is seen by neurology.  Currently on aspirin and Plavix. Patient will need follow-up on ICA aneurysm as outpatient. Patient will also need follow-up with cardiology as outpatient for loop recorder. Patient currently still has significant weakness, physical therapy/Occupational Therapy recommended SNF placement.  Acute kidney injury Secondary to Covid. Renal function has improved. Lab Results  Component Value  Date   CREATININE 1.06 (H) 05/21/2020   CREATININE 1.10 (H) 05/20/2020   CREATININE 0.97 05/19/2020   CREATININE 1.52 (H) 05/17/2020     DVT prophylaxis: Lovenox Code Status:  Family Communication:  Status is: Inpatient    Dispo: The patient is from: Home  Anticipated d/c is to: SNF  Anticipated d/c date is: 3 days  Patient currently is not medically stable to d/c.     Consultants:  Neurology    Procedures/Significant Events:  1/3 Echocardiogram;Left Ventricle: Left ventricular ejection fraction, by estimation, is 55  to 60%. The left ventricle has normal function. The left ventricle has no  regional wall motion abnormalities. The left ventricular internal cavity  size was normal in size. There is  mild left ventricular hypertrophy. Left ventricular diastolic parameters  are consistent with Grade I diastolic dysfunction (impaired relaxation).   Right Ventricle: The right ventricular size is normal. No increase in  right ventricular wall thickness. Right ventricular systolic function is  normal. Tricuspid regurgitation signal is inadequate for assessing PA  pressure.   Left Atrium: Left atrial size was mildly dilated.   Right Atrium: Right atrial size was normal in size.   Pericardium: There is no evidence of pericardial effusion.   Mitral Valve: The mitral valve is normal in structure. No evidence of  mitral valve regurgitation. No evidence of mitral valve stenosis.   Tricuspid Valve: The tricuspid valve is normal in structure. Tricuspid  valve regurgitation is not demonstrated. No evidence of tricuspid  stenosis.   Aortic Valve: The aortic valve is normal in structure. Aortic valve  regurgitation is not visualized. No aortic stenosis is present. Aortic  valve mean  gradient measures 6.5 mmHg. Aortic valve peak gradient measures  10.0 mmHg. Aortic valve area, by VTI  measures 2.50 cm.   Pulmonic Valve: The pulmonic  valve was normal in structure. Pulmonic valve  regurgitation is not visualized. No evidence of pulmonic stenosis.   Aorta: The aortic root is normal in size and structure.   Venous: The inferior vena cava was not well visualized.   I have personally reviewed and interpreted all radiology studies and my findings are as above.  VENTILATOR SETTINGS: Room air 1/5 SPO2 95%   Cultures   Antimicrobials: Anti-infectives (From admission, onward)   Start     Ordered Stop   05/22/20 1000  remdesivir 100 mg in sodium chloride 0.9 % 100 mL IVPB        05/21/20 1347     05/20/20 1600  remdesivir 100 mg in sodium chloride 0.9 % 100 mL IVPB  Status:  Discontinued        05/20/20 1442 05/21/20 1346   05/18/20 1000  remdesivir 100 mg in sodium chloride 0.9 % 100 mL IVPB       "Followed by" Linked Group Details   05/18/20 0014 05/22/20 0959   05/18/20 0015  remdesivir 200 mg in sodium chloride 0.9% 250 mL IVPB       "Followed by" Linked Group Details   05/18/20 0014 05/18/20 0232   05/17/20 2300  cefTRIAXone (ROCEPHIN) 1 g in sodium chloride 0.9 % 100 mL IVPB        05/17/20 2249 05/18/20 0022   05/17/20 2300  azithromycin (ZITHROMAX) 500 mg in sodium chloride 0.9 % 250 mL IVPB        05/17/20 2249 05/18/20 0235       Devices    LINES / TUBES:      Continuous Infusions: . remdesivir 100 mg in NS 100 mL 100 mg (05/20/20 1739)  . remdesivir 100 mg in NS 100 mL       Objective: Vitals:   05/20/20 2328 05/21/20 0445 05/21/20 0609 05/21/20 0818  BP: (!) 150/77 (!) 172/100 (!) 154/93 (!) 155/85  Pulse: 61 71  70  Resp: 18 18  (!) 24  Temp: (!) 97.5 F (36.4 C) 98 F (36.7 C)  97.6 F (36.4 C)  TempSrc: Oral Oral  Oral  SpO2: 94% 99%  95%  Weight:      Height:       No intake or output data in the 24 hours ending 05/21/20 8101 Filed Weights   05/17/20 2141  Weight: 65.8 kg    Examination:  General: A/O x4, No acute respiratory distress Eyes: negative scleral  hemorrhage, negative anisocoria, negative icterus ENT: Negative Runny nose, negative gingival bleeding, Neck:  Negative scars, masses, torticollis, lymphadenopathy, JVD Lungs: Clear to auscultation bilaterally without wheezes or crackles Cardiovascular: Regular rate and rhythm without murmur gallop or rub normal S1 and S2 Abdomen: negative abdominal pain, nondistended, positive soft, bowel sounds, no rebound, no ascites, no appreciable mass Extremities: No significant cyanosis, clubbing, or edema bilateral lower extremities Skin: Negative rashes, lesions, ulcers Psychiatric:  Negative depression, negative anxiety, negative fatigue, negative mania  Central nervous system:  Cranial nerves II through XII intact, tongue/uvula midline, all extremities muscle strength 5/5, sensation intact throughout, finger nose finger bilateral past-pointing, quick finger touch bilateral difficult to accomplish, positive dysarthria, negative expressive aphasia, negative receptive aphasia.  .     Data Reviewed: Care during the described time interval was provided by me .  I have  reviewed this patient's available data, including medical history, events of note, physical examination, and all test results as part of my evaluation.  CBC: Recent Labs  Lab 05/17/20 2206 05/19/20 0515 05/20/20 0412 05/21/20 0351  WBC 5.6 10.4 11.5* 9.7  NEUTROABS 4.4 8.4* 9.5* 7.8*  HGB 13.5 13.1 14.3 12.7  HCT 40.6 40.0 42.6 38.3  MCV 86.2 88.5 84.0 83.6  PLT 151 196 286 262   Basic Metabolic Panel: Recent Labs  Lab 05/17/20 2206 05/19/20 0515 05/20/20 0412 05/21/20 0351  NA 138 136 138 139  K 3.8 3.9 3.5 3.6  CL 100 107 100 104  CO2 26 23 26 25   GLUCOSE 121* 141* 141* 121*  BUN 29* 28* 27* 33*  CREATININE 1.52* 0.97 1.10* 1.06*  CALCIUM 10.5* 9.9 10.6* 10.4*  MG 2.1 1.8 1.7  --    GFR: Estimated Creatinine Clearance: 56.4 mL/min (A) (by C-G formula based on SCr of 1.06 mg/dL (H)). Liver Function Tests: Recent  Labs  Lab 05/17/20 2206 05/19/20 0515 05/20/20 0412 05/21/20 0351  AST 24 19 16  14*  ALT 15 13 15 12   ALKPHOS 56 51 64 55  BILITOT 0.9 0.5 0.7 0.8  PROT 7.4 6.1* 7.3 6.5  ALBUMIN 3.6 2.9* 3.3* 3.0*   No results for input(s): LIPASE, AMYLASE in the last 168 hours. Recent Labs  Lab 05/17/20 2231  AMMONIA 11   Coagulation Profile: Recent Labs  Lab 05/17/20 2206  INR 1.0   Cardiac Enzymes: Recent Labs  Lab 05/17/20 2206  CKTOTAL 38   BNP (last 3 results) No results for input(s): PROBNP in the last 8760 hours. HbA1C: No results for input(s): HGBA1C in the last 72 hours. CBG: Recent Labs  Lab 05/20/20 0826 05/20/20 1159 05/20/20 1704 05/20/20 2055 05/21/20 0758  GLUCAP 146* 135* 149* 158* 112*   Lipid Profile: No results for input(s): CHOL, HDL, LDLCALC, TRIG, CHOLHDL, LDLDIRECT in the last 72 hours. Thyroid Function Tests: No results for input(s): TSH, T4TOTAL, FREET4, T3FREE, THYROIDAB in the last 72 hours. Anemia Panel: No results for input(s): VITAMINB12, FOLATE, FERRITIN, TIBC, IRON, RETICCTPCT in the last 72 hours. Sepsis Labs: Recent Labs  Lab 05/17/20 2206 05/19/20 0515 05/20/20 0412  PROCALCITON 0.19 <0.10 0.16    Recent Results (from the past 240 hour(s))  Resp Panel by RT-PCR (Flu A&B, Covid) Nasopharyngeal Swab     Status: Abnormal   Collection Time: 05/17/20 10:31 PM   Specimen: Nasopharyngeal Swab; Nasopharyngeal(NP) swabs in vial transport medium  Result Value Ref Range Status   SARS Coronavirus 2 by RT PCR POSITIVE (A) NEGATIVE Final    Comment: RESULT CALLED TO, READ BACK BY AND VERIFIED WITH: jenna elington @2350  on 05/17/20 skl (NOTE) SARS-CoV-2 target nucleic acids are DETECTED.  The SARS-CoV-2 RNA is generally detectable in upper respiratory specimens during the acute phase of infection. Positive results are indicative of the presence of the identified virus, but do not rule out bacterial infection or co-infection with other  pathogens not detected by the test. Clinical correlation with patient history and other diagnostic information is necessary to determine patient infection status. The expected result is Negative.  Fact Sheet for Patients: 07/18/20  Fact Sheet for Healthcare Providers: 07/15/20  This test is not yet approved or cleared by the FDA and  has been authorized for detection and/or diagnosis of SARS-CoV-2 by FDA under an Emergency Use Authorization (EUA).  This EUA will remain in effect (meaning this test can  be used) for the  duration of  the COVID-19 declaration under Section 564(b)(1) of the Act, 21 U.S.C. section 360bbb-3(b)(1), unless the authorization is terminated or revoked sooner.     Influenza A by PCR NEGATIVE NEGATIVE Final   Influenza B by PCR NEGATIVE NEGATIVE Final    Comment: (NOTE) The Xpert Xpress SARS-CoV-2/FLU/RSV plus assay is intended as an aid in the diagnosis of influenza from Nasopharyngeal swab specimens and should not be used as a sole basis for treatment. Nasal washings and aspirates are unacceptable for Xpert Xpress SARS-CoV-2/FLU/RSV testing.  Fact Sheet for Patients: BloggerCourse.com  Fact Sheet for Healthcare Providers: SeriousBroker.it  This test is not yet approved or cleared by the [place] FDA and has been authorized for detection and/or diagnosis of SARS-CoV-2 by FDA under an Emergency Use Authorization (EUA). This EUA will remain in effect (meaning this test can be used) for the duration of the COVID-19 declaration under Section 564(b)(1) of the Act, 21 U.S.C. section 360bbb-3(b)(1), unless the authorization is terminated or revoked.  Performed at [facility], [address] [place], [place] [address]   Blood culture (routine x 2)     Status: None (Preliminary result)   Collection Time: 05/17/20  10:50 PM   Specimen: BLOOD  Result Value Ref Range Status   Specimen Description BLOOD BLOOD RIGHT FOREARM  Final   Special Requests   Final    BOTTLES DRAWN AEROBIC AND ANAEROBIC Blood Culture results may not be optimal due to an excessive volume of blood received in culture bottles   Culture   Final    NO GROWTH 4 DAYS Performed at [facility], [address], [place], [place] [address]    Report Status PENDING  Incomplete  Blood culture (routine x 2)     Status: None (Preliminary result)   Collection Time: 05/17/20 10:55 PM   Specimen: BLOOD  Result Value Ref Range Status   Specimen Description BLOOD BLOOD LEFT FOREARM  Final   Special Requests   Final    BOTTLES DRAWN AEROBIC AND ANAEROBIC Blood Culture adequate volume   Culture   Final    NO GROWTH 4 DAYS Performed at [facility], [address] [place], [place] [address]    Report Status PENDING  Incomplete         Radiology Studies: CT Code Stroke CTA Head W/WO contrast  Result Date: 05/19/2020 CLINICAL DATA:  Possible aneurysm on MRA EXAM: CT ANGIOGRAPHY HEAD AND NECK TECHNIQUE: Multidetector CT imaging of the head and neck was performed using the standard protocol during bolus administration of intravenous contrast. Multiplanar CT image reconstructions and MIPs were obtained to evaluate the vascular anatomy. Carotid stenosis measurements (when applicable) are obtained utilizing NASCET criteria, using the distal internal carotid diameter as the denominator. CONTRAST:  44mL OMNIPAQUE IOHEXOL 350 MG/ML SOLN COMPARISON:  MRI and MRA 05/18/2020 FINDINGS: CT HEAD Brain: There is no acute intracranial hemorrhage, mass effect, or edema. Gray-white differentiation is preserved. Small stroke on MRI is not visible. Patchy and confluent areas of hypoattenuation in the supratentorial white matter likely reflect chronic microvascular ischemic changes. There is no extra-axial fluid collection. Prominence of the  ventricles and sulci reflects minor generalized parenchymal volume loss. Vascular: There is atherosclerotic calcification at the skull base. Skull: Calvarium is unremarkable. Sinuses/Orbits: No acute finding. Other: None. Review of the MIP images confirms the above findings CTA NECK Aortic arch: Great vessel origins are patent. Right carotid system: Patent. No measurable stenosis at the ICA origin. Left carotid system:  Patent. No measurable stenosis at the ICA origin. Vertebral arteries: Patent. Left vertebral artery is slightly dominant. No measurable stenosis. Skeleton: Degenerative changes of the cervical spine. Large periapical lucency about remaining left mandibular molar. Other neck: No mass or adenopathy. Upper chest: Refer to concurrent dedicated chest imaging. Review of the MIP images confirms the above findings CTA HEAD Anterior circulation: Intracranial internal carotid arteries are patent. There are bilateral inferiorly directed outpouching incorporating the origins of the posterior communicating arteries. Both measure approximately 3 x 3 mm. There is also a 1.4 x 1.2 x 1.4 cm structure with calcification at the periphery along the medial aspect of the cavernous right carotid. This encroaches on the sella with leftward displacement of the pituitary. A diminutive focus of enhancement extends medially from the cavernous carotid into the structure. Anterior cerebral arteries are patent. 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 Middle cerebral arteries are patent. Posterior circulation: Intracranial vertebral arteries, basilar artery, and posterior cerebral arteries are patent. Bilateral posterior communicating arteries are present. Venous sinuses: Patent as allowed by contrast bolus timing. Review of the MIP images confirms the above findings IMPRESSION: Bilateral 3 mm outpouchings at the posterior communicating artery origins reflecting infundibula or aneurysms. 1.4 cm thrombosed aneurysm  along the medial cavernous right ICA. There is likely trace residual filling at the neck. No hemodynamically significant stenosis in the neck. No acute intracranial abnormality. Small stroke on MRI is not visible. Electronically Signed   By: Guadlupe SpanishPraneil  Patel M.D.   On: 05/19/2020 16:02   CT Code Stroke CTA Neck W/WO contrast  Result Date: 05/19/2020 CLINICAL DATA:  Possible aneurysm on MRA EXAM: CT ANGIOGRAPHY HEAD AND NECK TECHNIQUE: Multidetector CT imaging of the head and neck was performed using the standard protocol during bolus administration of intravenous contrast. Multiplanar CT image reconstructions and MIPs were obtained to evaluate the vascular anatomy. Carotid stenosis measurements (when applicable) are obtained utilizing NASCET criteria, using the distal internal carotid diameter as the denominator. CONTRAST:  75mL OMNIPAQUE IOHEXOL 350 MG/ML SOLN COMPARISON:  MRI and MRA 05/18/2020 FINDINGS: CT HEAD Brain: There is no acute intracranial hemorrhage, mass effect, or edema. Gray-white differentiation is preserved. Small stroke on MRI is not visible. Patchy and confluent areas of hypoattenuation in the supratentorial white matter likely reflect chronic microvascular ischemic changes. There is no extra-axial fluid collection. Prominence of the ventricles and sulci reflects minor generalized parenchymal volume loss. Vascular: There is atherosclerotic calcification at the skull base. Skull: Calvarium is unremarkable. Sinuses/Orbits: No acute finding. Other: None. Review of the MIP images confirms the above findings CTA NECK Aortic arch: Great vessel origins are patent. Right carotid system: Patent. No measurable stenosis at the ICA origin. Left carotid system: Patent. No measurable stenosis at the ICA origin. Vertebral arteries: Patent. Left vertebral artery is slightly dominant. No measurable stenosis. Skeleton: Degenerative changes of the cervical spine. Large periapical lucency about remaining left  mandibular molar. Other neck: No mass or adenopathy. Upper chest: Refer to concurrent dedicated chest imaging. Review of the MIP images confirms the above findings CTA HEAD Anterior circulation: Intracranial internal carotid arteries are patent. There are bilateral inferiorly directed outpouching incorporating the origins of the posterior communicating arteries. Both measure approximately 3 x 3 mm. There is also a 1.4 x 1.2 x 1.4 cm structure with calcification at the periphery along the medial aspect of the cavernous right carotid. This encroaches on the sella with leftward displacement of the pituitary. A diminutive focus of enhancement extends medially from the cavernous  carotid into the structure. Anterior cerebral arteries are patent. 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 Middle cerebral arteries are patent. Posterior circulation: Intracranial vertebral arteries, basilar artery, and posterior cerebral arteries are patent. Bilateral posterior communicating arteries are present. Venous sinuses: Patent as allowed by contrast bolus timing. Review of the MIP images confirms the above findings IMPRESSION: Bilateral 3 mm outpouchings at the posterior communicating artery origins reflecting infundibula or aneurysms. 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 There is likely trace residual filling at the neck. No hemodynamically significant stenosis in the neck. No acute intracranial abnormality. Small stroke on MRI is not visible. Electronically Signed   By: Macy Mis M.D.   On: 05/19/2020 16:02   CT ANGIO CHEST PE W OR WO CONTRAST  Result Date: 05/19/2020 CLINICAL DATA:  Shortness of breath. EXAM: CT ANGIOGRAPHY CHEST WITH CONTRAST TECHNIQUE: Multidetector CT imaging of the chest was performed using the standard protocol during bolus administration of intravenous contrast. Multiplanar CT image reconstructions and MIPs were obtained to evaluate the vascular anatomy.  CONTRAST:  31mL OMNIPAQUE IOHEXOL 350 MG/ML SOLN COMPARISON:  None. FINDINGS: Cardiovascular: Satisfactory opacification of the pulmonary arteries to the segmental level. No evidence of pulmonary embolism. Normal heart size. No pericardial effusion. Mediastinum/Nodes: No enlarged mediastinal, hilar, or axillary lymph nodes. Thyroid gland, trachea, and esophagus demonstrate no significant findings. Lungs/Pleura: No pneumothorax or pleural effusion is noted. Right lower lobe atelectasis or pneumonia is noted. Patchy airspace opacities are noted throughout the other lobes bilaterally suggesting subsegmental atelectasis or multifocal inflammation. Upper Abdomen: No acute abnormality. Musculoskeletal: No chest wall abnormality. No acute or significant osseous findings. Review of the MIP images confirms the above findings. IMPRESSION: 1. No definite evidence of pulmonary embolus. 2. Right lower lobe atelectasis or pneumonia is noted. Patchy airspace opacities are noted throughout the other lobes bilaterally suggesting subsegmental atelectasis or multifocal inflammation. Electronically Signed   By: Marijo Conception M.D.   On: 05/19/2020 15:54   ECHOCARDIOGRAM COMPLETE  Result Date: 05/19/2020    ECHOCARDIOGRAM REPORT   Patient Name:   Stephanie [name] Date of Exam: 05/19/2020 Medical Rec #:  [id number]       Height:       72.0 in Accession #:    [id number]      Weight:       145.0 lb Date of Birth:  1955/09/12       BSA:          1.858 m Patient Age:    65 years        BP:           150/84 mmHg Patient Gender: F               HR:           70 bpm. Exam Location:  [facility] Procedure: 2D Echo, Cardiac Doppler and Color Doppler Indications:     Stroke 434.91  History:         Patient has no prior history of Echocardiogram examinations.                  Risk Factors:Diabetes.  Sonographer:     Sherrie Sport RDCS (AE) Referring Phys:  [id number] Lorenza Chick Diagnosing Phys: Kathlyn Sacramento MD  Sonographer Comments: No subcostal window.  IMPRESSIONS  1. Left ventricular ejection fraction, by estimation, is 55 to 60%. The left ventricle has normal function. The left ventricle has no regional wall motion abnormalities. There  is mild left ventricular hypertrophy. Left ventricular diastolic parameters are consistent with Grade I diastolic dysfunction (impaired relaxation).  2. Right ventricular systolic function is normal. The right ventricular size is normal. Tricuspid regurgitation signal is inadequate for assessing PA pressure.  3. Left atrial size was mildly dilated.  4. The mitral valve is normal in structure. No evidence of mitral valve regurgitation. No evidence of mitral stenosis.  5. The aortic valve is normal in structure. Aortic valve regurgitation is not visualized. No aortic stenosis is present. FINDINGS  Left Ventricle: Left ventricular ejection fraction, by estimation, is 55 to 60%. The left ventricle has normal function. The left ventricle has no regional wall motion abnormalities. The left ventricular internal cavity size was normal in size. There is  mild left ventricular hypertrophy. Left ventricular diastolic parameters are consistent with Grade I diastolic dysfunction (impaired relaxation). Right Ventricle: The right ventricular size is normal. No increase in right ventricular wall thickness. Right ventricular systolic function is normal. Tricuspid regurgitation signal is inadequate for assessing PA pressure. Left Atrium: Left atrial size was mildly dilated. Right Atrium: Right atrial size was normal in size. Pericardium: There is no evidence of pericardial effusion. Mitral Valve: The mitral valve is normal in structure. No evidence of mitral valve regurgitation. No evidence of mitral valve stenosis. Tricuspid Valve: The tricuspid valve is normal in structure. Tricuspid valve regurgitation is not demonstrated. No evidence of tricuspid stenosis. Aortic Valve: The aortic valve is normal in structure. Aortic valve regurgitation is not  visualized. No aortic stenosis is present. Aortic valve mean gradient measures 6.5 mmHg. Aortic valve peak gradient measures 10.0 mmHg. Aortic valve area, by VTI measures 2.50 cm. Pulmonic Valve: The pulmonic valve was normal in structure. Pulmonic valve regurgitation is not visualized. No evidence of pulmonic stenosis. Aorta: The aortic root is normal in size and structure. Venous: The inferior vena cava was not well visualized. IAS/Shunts: No atrial level shunt detected by color flow Doppler.  LEFT VENTRICLE PLAX 2D LVIDd:         4.71 cm  Diastology LVIDs:         3.35 cm  LV e' medial:    5.11 cm/s LV PW:         1.30 cm  LV E/e' medial:  14.2 LV IVS:        0.93 cm  LV e' lateral:   4.35 cm/s LVOT diam:     2.00 cm  LV E/e' lateral: 16.7 LV SV:         78 LV SV Index:   42 LVOT Area:     3.14 cm  RIGHT VENTRICLE RV Basal diam:  4.15 cm RV S prime:     16.30 cm/s TAPSE (M-mode): 4.3 cm LEFT ATRIUM           Index       RIGHT ATRIUM           Index LA diam:      4.60 cm 2.48 cm/m  RA Area:     17.90 cm LA Vol (A2C): 58.5 ml 31.48 ml/m RA Volume:   46.30 ml  24.91 ml/m LA Vol (A4C): 66.7 ml 35.89 ml/m  AORTIC VALVE                    PULMONIC VALVE AV Area (Vmax):    2.40 cm     PV Vmax:        0.80 m/s AV Area (Vmean):   2.11 cm  PV Peak grad:   2.5 mmHg AV Area (VTI):     2.50 cm     RVOT Peak grad: 3 mmHg AV Vmax:           158.50 cm/s AV Vmean:          119.000 cm/s AV VTI:            0.313 m AV Peak Grad:      10.0 mmHg AV Mean Grad:      6.5 mmHg LVOT Vmax:         121.00 cm/s LVOT Vmean:        79.800 cm/s LVOT VTI:          0.249 m LVOT/AV VTI ratio: 0.80  AORTA Ao Root diam: 3.37 cm MITRAL VALVE                TRICUSPID VALVE MV Area (PHT): 2.50 cm     TR Peak grad:   20.8 mmHg MV Decel Time: 303 msec     TR Vmax:        228.00 cm/s MV E velocity: 72.80 cm/s MV A velocity: 101.00 cm/s  SHUNTS MV E/A ratio:  0.72         Systemic VTI:  0.25 m                             Systemic Diam: 2.00 cm  Lorine Bears MD Electronically signed by Lorine Bears MD Signature Date/Time: 05/19/2020/5:08:10 PM    Final         Scheduled Meds: . amLODipine  10 mg Oral Daily  . aspirin EC  81 mg Oral Daily  . clopidogrel  75 mg Oral Daily  . dexamethasone  6 mg Oral Daily  . enoxaparin (LOVENOX) injection  40 mg Subcutaneous Q24H  . influenza vac split quadrivalent PF  0.5 mL Intramuscular Tomorrow-1000  . insulin aspart  0-15 Units Subcutaneous TID WC  . insulin detemir  0.075 Units/kg Subcutaneous BID  . Ipratropium-Albuterol  1 puff Inhalation Q6H  . senna  1 tablet Oral BID   Continuous Infusions: . remdesivir 100 mg in NS 100 mL 100 mg (05/20/20 1739)  . remdesivir 100 mg in NS 100 mL       LOS: 3 days    Time spent:40 min    Keylie Beavers, Roselind Messier, MD [facility] Pager [phone]  If 7PM-7AM, please contact night-coverage [email or web address] Password [email or web address] 05/21/2020, 9:22 AM

## 2020-05-21 NOTE — TOC Initial Note (Addendum)
Transition of Care [facility]) - Initial/Assessment Note    Patient Details  Name: Stephanie [name] MRN: [id number] Date of Birth: 1955/09/27  [facility]) CM/SW Contact:    Margarito Liner, LCSW Phone Number: 05/21/2020, 10:29 AM  Clinical Narrative: Patient on COVID isolation. CSW called patient in the room, introduced role, and explained that PT recommendations would be discussed. Explained that they are recommending SNF but since she does not have insurance, we will not be able to pursue that. Patient unsure about home health and keeps saying that she needs to talk with her family. Patient does not have a PCP so will need to get her a new patient appointment within the next 30 days before charity home health can be arranged. Patient confirmed she will need RW and 3-in-1 at discharge. Patient lives home with her two brothers that are not able to provide assistance. CSW received call from patient's aunt Al Corpus this morning [phone]). Her husband Alinda Money is patient's uncle. Mrs. Elza Rafter is requesting an update on patient's status. CSW explained that she is not listed in her emergency contacts and due to HIPAA, will need to get permission before information is shared. CSW asked patient about this several times during our phone conversation but she did not appear to understand the question, stating that she needed to wait until she got home. RN will ask her next time she goes in the room. No further concerns. CSW encouraged patient to contact CSW as needed. CSW will continue to follow patient for support and facilitate return home when stable.         11:55 am: Per RN, patient said it's okay to give aunt Marionette an update. Sent secure chat to MD with her contact information.         2:47 pm: Per RN, patient's aunt [place] requesting call with update on discharge plan. CSW provided update. Ms. Cherre Huger was with her daughter as well. Patient was living in the basement of her  brother's home prior to admission. 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 They believe she had a stroke and then got COVID so right now she is unable to care for herself. Patient's aunt and cousin stated she is unable to go back to her brother's home at this time. Her family has medical issues, on disability, and cannot provide assistance. If patient is able to improve her strength to the point where she can walk and take care of herself, one of her family members can let her stay with them. Patient unable to private pay for SNF. Will discuss case with TOC Supervisor. Sent secure email to financial counselor to see if Medicaid screening had been completed and if she meets criteria for Medicaid/Disability.  4:03 pm: Artist has spoken with patient but does not see a disabling medical condition expected to last for the next 12 months. She will continue to monitor.  Expected Discharge Plan: Home w Home Health Services Barriers to Discharge: Continued Medical Work up   Patient Goals and CMS Choice        Expected Discharge Plan and Services Expected Discharge Plan: Home w Home Health Services     Post Acute Care Choice: Home Health Living arrangements for the past 2 months: Apartment                                      Prior  Living Arrangements/Services Living arrangements for the past 2 months: Apartment Lives with:: Siblings Patient language and need for interpreter reviewed:: Yes Do you feel safe going back to the place where you live?: Yes      Need for Family Participation in Patient Care: Yes (Comment) Care giver support system in place?: No (comment)   Criminal Activity/Legal Involvement Pertinent to Current Situation/Hospitalization: No - Comment as needed  Activities of Daily Living Home Assistive Devices/Equipment: None ADL Screening (condition at time of admission) Patient's cognitive ability adequate to safely complete daily activities?: Yes Is  the patient deaf or have difficulty hearing?: No Does the patient have difficulty seeing, even when wearing glasses/contacts?: No Does the patient have difficulty concentrating, remembering, or making decisions?: No Patient able to express need for assistance with ADLs?: Yes Does the patient have difficulty dressing or bathing?: No Independently performs ADLs?: No Communication: Independent Bathing: Independent Does the patient have difficulty walking or climbing stairs?: Yes Weakness of Legs: Both Weakness of Arms/Hands: Both  Permission Sought/Granted                  Emotional Assessment Appearance:: Appears stated age Attitude/Demeanor/Rapport: Engaged Affect (typically observed): Calm Orientation: : Oriented to Self,Oriented to Place,Oriented to  Time,Oriented to Situation Alcohol / Substance Use: Not Applicable Psych Involvement: No (comment)  Admission diagnosis:  Dehydration [E86.0] Slurred speech [R47.81] Weakness [R53.1] 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 Acute hypoxemic respiratory failure (HCC) 05/19/2020  . Pneumonia due to COVID-19 virus 05/18/2020  . Dehydration 05/18/2020  . Slurred speech 05/18/2020  . Acute ischemic left ACA stroke (HCC) 05/18/2020   PCP:  Pcp, No Pharmacy:  No Pharmacies Listed    Social Determinants of Health (SDOH) Interventions    Readmission Risk Interventions No flowsheet data found.

## 2020-05-22 LAB — COMPREHENSIVE METABOLIC PANEL
ALT: 11 U/L (ref 0–44)
AST: 13 U/L — ABNORMAL LOW (ref 15–41)
Albumin: 3 g/dL — ABNORMAL LOW (ref 3.5–5.0)
Alkaline Phosphatase: 56 U/L (ref 38–126)
Anion gap: 7 (ref 5–15)
BUN: 41 mg/dL — ABNORMAL HIGH (ref 8–23)
CO2: 28 mmol/L (ref 22–32)
Calcium: 10.2 mg/dL (ref 8.9–10.3)
Chloride: 105 mmol/L (ref 98–111)
Creatinine, Ser: 1.26 mg/dL — ABNORMAL HIGH (ref 0.44–1.00)
GFR, Estimated: 48 mL/min — ABNORMAL LOW (ref >60)
Glucose, Bld: 105 mg/dL — ABNORMAL HIGH (ref 70–99)
Potassium: 3.8 mmol/L (ref 3.5–5.1)
Sodium: 140 mmol/L (ref 135–145)
Total Bilirubin: 0.8 mg/dL (ref 0.3–1.2)
Total Protein: 6.4 g/dL — ABNORMAL LOW (ref 6.5–8.1)

## 2020-05-22 LAB — CBC WITH DIFFERENTIAL/PLATELET
Abs Immature Granulocytes: 0.29 10*3/uL — ABNORMAL HIGH (ref 0.00–0.07)
Basophils Absolute: 0 10*3/uL (ref 0.0–0.1)
Basophils Relative: 0 %
Eosinophils Absolute: 0 10*3/uL (ref 0.0–0.5)
Eosinophils Relative: 0 %
HCT: 37.5 % (ref 36.0–46.0)
Hemoglobin: 12.5 g/dL (ref 12.0–15.0)
Immature Granulocytes: 3 %
Lymphocytes Relative: 10 %
Lymphs Abs: 0.9 10*3/uL (ref 0.7–4.0)
MCH: 28.2 pg (ref 26.0–34.0)
MCHC: 33.3 g/dL (ref 30.0–36.0)
MCV: 84.5 fL (ref 80.0–100.0)
Monocytes Absolute: 1.1 10*3/uL — ABNORMAL HIGH (ref 0.1–1.0)
Monocytes Relative: 12 %
Neutro Abs: 6.7 10*3/uL (ref 1.7–7.7)
Neutrophils Relative %: 75 %
Platelets: 276 10*3/uL (ref 150–400)
RBC: 4.44 MIL/uL (ref 3.87–5.11)
RDW: 13.2 % (ref 11.5–15.5)
WBC: 9 10*3/uL (ref 4.0–10.5)
nRBC: 0 % (ref 0.0–0.2)

## 2020-05-22 LAB — CULTURE, BLOOD (ROUTINE X 2)
Culture: NO GROWTH
Culture: NO GROWTH
Special Requests: ADEQUATE

## 2020-05-22 LAB — GLUCOSE, CAPILLARY
Glucose-Capillary: 104 mg/dL — ABNORMAL HIGH (ref 70–99)
Glucose-Capillary: 134 mg/dL — ABNORMAL HIGH (ref 70–99)
Glucose-Capillary: 112 mg/dL — ABNORMAL HIGH (ref 70–99)
Glucose-Capillary: 137 mg/dL — ABNORMAL HIGH (ref 70–99)

## 2020-05-22 LAB — C-REACTIVE PROTEIN: CRP: 1.5 mg/dL — ABNORMAL HIGH (ref <1.0)

## 2020-05-22 LAB — LACTATE DEHYDROGENASE: LDH: 167 U/L (ref 98–192)

## 2020-05-22 LAB — PHOSPHORUS: Phosphorus: 3.2 mg/dL (ref 2.5–4.6)

## 2020-05-22 LAB — D-DIMER, QUANTITATIVE: D-Dimer, Quant: 0.68 ug/mL-FEU — ABNORMAL HIGH (ref 0.00–0.50)

## 2020-05-22 LAB — MAGNESIUM: Magnesium: 2.4 mg/dL (ref 1.7–2.4)

## 2020-05-22 LAB — FERRITIN: Ferritin: 467 ng/mL — ABNORMAL HIGH (ref 11–307)

## 2020-05-22 NOTE — Plan of Care (Signed)
Pt Axox4. Calm and cooperative and able to voice her needs. Pt using BSC. Adequate amount of clear amber urine noted. Prn pain med and BP administered overnight for BP > 180. Pt remains on 2 LNC with shortness of breath noted on exertion. Strong productive coughs noted. Safety measures in place. Will continue to monitor.  Problem: Education: Goal: Knowledge of General Education information will improve Description: Including pain rating scale, medication(s)/side effects and non-pharmacologic comfort measures Outcome: Progressing   Problem: Health Behavior/Discharge Planning: Goal: Ability to manage health-related needs will improve Outcome: Progressing   Problem: Clinical Measurements: Goal: Ability to maintain clinical measurements within normal limits will improve Outcome: Progressing Goal: Will remain free from infection Outcome: Progressing Goal: Diagnostic test results will improve Outcome: Progressing Goal: Respiratory complications will improve Outcome: Progressing Goal: Cardiovascular complication will be avoided Outcome: Progressing   Problem: Activity: Goal: Risk for activity intolerance will decrease Outcome: Progressing   Problem: Nutrition: Goal: Adequate nutrition will be maintained Outcome: Progressing   Problem: Coping: Goal: Level of anxiety will decrease Outcome: Progressing   Problem: Elimination: Goal: Will not experience complications related to bowel motility Outcome: Progressing Goal: Will not experience complications related to urinary retention Outcome: Progressing   Problem: Pain Managment: Goal: General experience of comfort will improve Outcome: Progressing   Problem: Safety: Goal: Ability to remain free from injury will improve Outcome: Progressing   Problem: Skin Integrity: Goal: Risk for impaired skin integrity will decrease Outcome: Progressing

## 2020-05-22 NOTE — TOC Progression Note (Addendum)
Transition of Care [facility]) - Progression Note    Patient Details  Name: ZANNA HAWN MRN: [id number] Date of Birth: 06-21-1955  [facility] CM/SW Contact  Margarito Liner, LCSW Phone Number: 05/22/2020, 10:34 AM  Clinical Narrative:  Asked TOC supervisor to review chart in order to assist with discharge planning. CSW updated patient's aunt, [place]. She provided other family phone numbers: patient's uncle Alcus Dad (Home: [phone], Cell: [phone]), patient's cousin Craig Staggers [phone]), and patient's friend Lourdes Sledge that is listed in the emergency contacts.   1:36 pm: TOC supervisor reviewed chart. Asked CIR admissions coordinator to review for potential appropriateness. TOC supervisor thinks patient is a good candidate for a LOG for SNF backup.  1:49 pm: Unable to reach aunt [place] so called uncle Alinda Money. His wife Marionette on phone as well. They are agreeable to CIR plan and agree that between all of the family members, they should be able to provide 24/7 supervision. They are also looking into hiring a private caregiver. CSW called patient and provided update. She is worried that [facility] is too far away but CSW explained that since she doesn't have insurance, this is probably her best rehab option. Notified patient, uncle, and aunt of SNF backup as well.  Expected Discharge Plan: Home w Home Health Services Barriers to Discharge: Continued Medical Work up  Expected Discharge Plan and Services Expected Discharge Plan: Home w Home Health Services     Post Acute Care Choice: Home Health Living arrangements for the past 2 months: Apartment                                       Social Determinants of Health (SDOH) Interventions    Readmission Risk Interventions No flowsheet data found.

## 2020-05-22 NOTE — Progress Notes (Signed)
PROGRESS NOTE    Stephanie [name]  QMG:[id number] DOB: [birth year] DOA: 05/17/2020 PCP: Pcp, No    Brief Narrative:  Mrs. Stephanie [name] was admitted to the hospital with a working diagnosis of acute hypoxic respiratory failure due to SARS COVID-19 viral pneumonia.  65 year old female past medical history for prior hysterectomy who presents with 3 weeks of not feeling well.  She had developed progressive weakness, and dyspnea, difficulty doing his activities of daily living.  On her initial physical examination temperature 98.7, blood pressure 143/92, heart rate 90, respiratory rate 16, her lungs had diffuse rhonchi and decreased breath sounds bilaterally, heart S1-S2, present, rhythmic, soft abdomen, no lower extremity edema.  Positive facial droop and slurred speech.  Sodium 138, potassium 3.8, chloride 100, bicarb 26, glucose 121, BUN 29, creatinine 1.52, troponin I 24.-27.  SARS COVID-19 positive  Chest radiograph with bilateral multilobar interstitial infiltrates, left lower lobe, right lower lobe and right upper lobe. EKG 91 bpm, left axis deviation, left anterior fascicular block, sinus rhythm, V4-V6 ST depressions T wave versions.  Further work-up with brain MRI revealed small focus of acute ischemia within the medial left frontal lobe.  No hemorrhage or mass-effect. 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  Echocardiogram with preserved LV systolic function, no intracardiac thrombosis.  Assessment & Plan:   Active Problems:   Pneumonia due to COVID-19 virus   Dehydration   Acute ischemic left ACA stroke (HCC)   Acute hypoxemic respiratory failure (HCC)   1.  Acute hypoxic respiratory failure due to SARS COVID-19 viral pneumonia.  RR: 16  Pulse oxymetry: 96%  Fi02: 2L/min per [place]  COVID-19 Labs  Recent Labs     05/20/20 0412 05/21/20 0351 05/22/20 0500  DDIMER  --   --  0.68*  FERRITIN  --   --  467*  LDH  --   --  167  CRP 5.7* 2.8* 1.5*    Lab Results  Component Value Date   SARSCOV2NAA POSITIVE (A) 05/17/2020   Patient with improved dyspnea. Her inflammatory markers are trending down,  Continue medical therapy with steroids. She has completed remdesivir. On bronchodilator therapy, antitussive agents and airway clearing techniques.  Out of bed to chair, continue PT and OT.   2.  Acute CVA medial left frontal lobe. Clinically improving, continue PT and OT. On dual antiplatelet therapy and statin therapy. Continue blood pressure control.   Pending transfer to SNF or CIR  3. Acute on chronic diastolic HF. Patient more euvolemic today, continue blood pressure control.   4. T2DM uncontrolled, with steroid induced hyperglycemia. Continue sliding scale for glucose cover and monitoring, basal insulin with 5 units levemir.     Status is: Inpatient  Remains inpatient appropriate because:Inpatient level of care appropriate due to severity of illness   Dispo: The patient is from: Home              Anticipated d/c is to: SNF              Anticipated d/c date is: 2 days              Patient currently is not medically stable to d/c.   DVT prophylaxis: Enoxaparin   Code Status:   full  Family Communication:  No family at the bedside       Subjective: Patient is feeling better, dyspnea is improving, no  chest pain, no facial droop.,  Objective: Vitals:   05/21/20 2356 05/22/20 0501 05/22/20 0847 05/22/20 1152  BP: (!) 158/91 (!) 182/92 (!) 147/89 (!) 153/78  Pulse: 63 (!) 59 (!) 58 60  Resp: (!) 24 16 20 16   Temp: 98 F (36.7 C) 97.6 F (36.4 C) 97.6 F (36.4 C) (!) 97.4 F (36.3 C)  TempSrc: Oral Oral Oral Oral  SpO2: 98% 100% 94% 96%  Weight:      Height:        Intake/Output Summary (Last 24 hours) at 05/22/2020 1548 Last data filed at 05/22/2020 1348 Gross per 24 hour   Intake 240 ml  Output 1380 ml  Net -1140 ml   Filed Weights   05/17/20 2141  Weight: 65.8 kg    Examination:   General: Not in pain or dyspnea, deconditioned  Neurology: Awake and alert, no facial droop. Strength preserved on hand grip and able to move extremities against gravity.  E ENT: no pallor, no icterus, oral mucosa moist Cardiovascular: No JVD. S1-S2 present, rhythmic, no gallops, rubs, or murmurs. No lower extremity edema. Pulmonary: positive breath sounds bilaterally, with no wheezing, rhonchi or rales. Gastrointestinal. Abdomen soft and non tender Skin. No rashes Musculoskeletal: no joint deformities     Data Reviewed: I have personally reviewed following labs and imaging studies  CBC: Recent Labs  Lab 05/17/20 2206 05/19/20 0515 05/20/20 0412 05/21/20 0351 05/22/20 0500  WBC 5.6 10.4 11.5* 9.7 9.0  NEUTROABS 4.4 8.4* 9.5* 7.8* 6.7  HGB 13.5 13.1 14.3 12.7 12.5  HCT 40.6 40.0 42.6 38.3 37.5  MCV 86.2 88.5 84.0 83.6 84.5  PLT 151 196 286 262 096   Basic Metabolic Panel: Recent Labs  Lab 05/17/20 2206 05/19/20 0515 05/20/20 0412 05/21/20 0351 05/22/20 0500  NA 138 136 138 139 140  K 3.8 3.9 3.5 3.6 3.8  CL 100 107 100 104 105  CO2 26 23 26 25 28   GLUCOSE 121* 141* 141* 121* 105*  BUN 29* 28* 27* 33* 41*  CREATININE 1.52* 0.97 1.10* 1.06* 1.26*  CALCIUM 10.5* 9.9 10.6* 10.4* 10.2  MG 2.1 1.8 1.7  --  2.4  PHOS  --   --   --   --  3.2   GFR: Estimated Creatinine Clearance: 47.5 mL/min (A) (by C-G formula based on SCr of 1.26 mg/dL (H)). Liver Function Tests: Recent Labs  Lab 05/17/20 2206 05/19/20 0515 05/20/20 0412 05/21/20 0351 05/22/20 0500  AST 24 19 16  14* 13*  ALT 15 13 15 12 11   ALKPHOS 56 51 64 55 56  BILITOT 0.9 0.5 0.7 0.8 0.8  PROT 7.4 6.1* 7.3 6.5 6.4*  ALBUMIN 3.6 2.9* 3.3* 3.0* 3.0*   No results for input(s): LIPASE, AMYLASE in the last 168 hours. Recent Labs  Lab 05/17/20 2231  AMMONIA 11   Coagulation  Profile: Recent Labs  Lab 05/17/20 2206  INR 1.0   Cardiac Enzymes: Recent Labs  Lab 05/17/20 2206  CKTOTAL 38   BNP (last 3 results) No results for input(s): PROBNP in the last 8760 hours. HbA1C: No results for input(s): HGBA1C in the last 72 hours. CBG: Recent Labs  Lab 05/21/20 1221 05/21/20 1640 05/21/20 2022 05/22/20 0742 05/22/20 1150  GLUCAP 117* 147* 127* 104* 134*   Lipid Profile: No results for input(s): CHOL, HDL, LDLCALC, TRIG, CHOLHDL, LDLDIRECT in the last 72 hours. Thyroid Function Tests: No results for input(s): TSH, T4TOTAL, FREET4, T3FREE, THYROIDAB in the last 72 hours. Anemia Panel:  Recent Labs    05/22/20 0500  FERRITIN 467*      Radiology Studies: I have reviewed all of the imaging during this hospital visit personally     Scheduled Meds: . amLODipine  10 mg Oral Daily  . vitamin C  500 mg Oral Daily  . aspirin EC  81 mg Oral Daily  . clopidogrel  75 mg Oral Daily  . dexamethasone  6 mg Oral Daily  . enoxaparin (LOVENOX) injection  40 mg Subcutaneous Q24H  . influenza vac split quadrivalent PF  0.5 mL Intramuscular Tomorrow-1000  . insulin aspart  0-15 Units Subcutaneous TID WC  . insulin detemir  0.075 Units/kg Subcutaneous BID  . Ipratropium-Albuterol  1 puff Inhalation Q6H  . senna  1 tablet Oral BID  . zinc sulfate  220 mg Oral Daily   Continuous Infusions:   LOS: 4 days        Ashima Shrake Annett Gula, MD

## 2020-05-22 NOTE — Progress Notes (Signed)
Physical Therapy Treatment Patient Details Name: Stephanie [name] MRN: [id number] DOB: [birth year] Today's Date: 05/22/2020    History of Present Illness 65 y.o. female with no significant medical history except for hysterectomy. She reports that she has felt ill for about 3 weeks. Initially she had diarrhea which did clear. However, she has had progressive weakness to the point where it is difficult to manage ADLs and she has had progressive SOB with a non-productive cough. Due to her symptoms she presents to [facility] for evaluation.    PT Comments    Pt was asleep in long sitting upon arriving. She easily awakes and is cooperative and [place] throughout session. Agrees to attempt OOB activity. On 2 L o2 throughout session. Pt was able to exit R side of bed, stand, and take ~ 5 steps with RW prior to endorsing severe fatigue and requesting to return to bed immediately. HR irregular during standing activity. Pt will greatly benefit from SNF to address deficits while assisting pt to PLOF. Acute PT will continue to follow per current POC.    Follow Up Recommendations  SNF     Equipment Recommendations  Rolling walker with 5" wheels;3in1 (PT)    Recommendations for Other Services       Precautions / Restrictions Precautions Precautions: Fall Restrictions Weight Bearing Restrictions: No    Mobility  Bed Mobility Overal bed mobility: Needs Assistance Bed Mobility: Rolling;Supine to Sit;Sit to Supine Rolling: Supervision   Supine to sit: Min guard Sit to supine: Min assist   General bed mobility comments: min assist to progress BLEs into bed from EOB sitting 2/2 to weakness/Fatigue  Transfers Overall transfer level: Needs assistance Equipment used: Rolling walker (2 wheeled) Transfers: Sit to/from Stand Sit to Stand: Min assist         General transfer comment: Min assist to stand from elevated bed height  Ambulation/Gait Ambulation/Gait assistance: Min guard Gait  Distance (Feet): 5 Feet Assistive device: Rolling walker (2 wheeled)   Gait velocity: decreased   General Gait Details: pt was able to tajke ~ 5 steps prior to requesting seated rest. fatigued extremely quickly. no telemetry in room.       Balance Overall balance assessment: Needs assistance Sitting-balance support: Feet supported Sitting balance-Leahy Scale: Good Sitting balance - Comments: no LOB   Standing balance support: Bilateral upper extremity supported Standing balance-Leahy Scale: Fair Standing balance comment: relint on BUE support for balance         Cognition Arousal/Alertness: Awake/alert Behavior During Therapy: Flat affect Overall Cognitive Status: Impaired/Different from baseline          General Comments: Pt was A and oriented and able to follow command sthroughout however very slow processing with repetition required to comprehend             Pertinent Vitals/Pain Pain Assessment: No/denies pain Pain Score: 0-No pain           PT Goals (current goals can now be found in the care plan section) Acute Rehab PT Goals Patient Stated Goal: return to PLOF working at [employer] Progress towards PT goals: Progressing toward goals    Frequency    Min 2X/week      PT Plan Current plan remains appropriate       AM-PAC PT "6 Clicks" Mobility   Outcome Measure    Help needed moving from lying on your back to sitting on the side of a flat bed without using bedrails?: A Little Help needed moving to  and from a bed to a chair (including a wheelchair)?: A Little Help needed standing up from a chair using your arms (e.g., wheelchair or bedside chair)?: A Lot Help needed to walk in hospital room?: A Lot Help needed climbing 3-5 steps with a railing? : A Lot 6 Click Score: 12    End of Session Equipment Utilized During Treatment: Gait belt;Oxygen (2 L o2 throughout session) Activity Tolerance: Patient limited by fatigue Patient left: in  bed;with call bell/phone within reach;with bed alarm set Nurse Communication: Mobility status PT Visit Diagnosis: Unsteadiness on feet (R26.81);Other abnormalities of gait and mobility (R26.89);Muscle weakness (generalized) (M62.81);History of falling (Z91.81);Difficulty in walking, not elsewhere classified (R26.2);Pain     Time: 1040-1057 PT Time Calculation (min) (ACUTE ONLY): 17 min  Charges:  $Therapeutic Activity: 8-22 mins                     Jetta Lout PTA 05/22/20, 1:31 PM

## 2020-05-22 NOTE — [place] FL2 (Signed)
[place] MEDICAID FL2 LEVEL OF CARE SCREENING TOOL     IDENTIFICATION  Patient Name: Stephanie [name] Birthdate: January 27, 1956 Sex: female Admission Date (Current Location): 05/17/2020  [place] and [place] Number:  Chiropodist and Address:  [facility], [address] [place], [place] [address]      Provider Number: [id number]  Attending Physician Name and Address:  Coralie Keens  Relative Name and Phone Number:       Current Level of Care: Hospital Recommended Level of Care: Skilled Nursing Facility Prior Approval Number:    Date Approved/Denied:   PASRR Number: [id number]  Discharge Plan: SNF    Current Diagnoses: Patient Active Problem List   Diagnosis Date Noted  . Acute hypoxemic respiratory failure (HCC) 05/19/2020  . Pneumonia due to COVID-19 virus 05/18/2020  . Dehydration 05/18/2020  . Slurred speech 05/18/2020  . Acute ischemic left ACA stroke (HCC) 05/18/2020    Orientation RESPIRATION BLADDER Height & Weight     Self,Time,Situation,Place  Normal Continent,External catheter Weight: 145 lb (65.8 kg) Height:  6' (182.9 cm)  BEHAVIORAL SYMPTOMS/MOOD NEUROLOGICAL BOWEL NUTRITION STATUS   (None)  (Acute stroke) Continent Diet  AMBULATORY STATUS COMMUNICATION OF NEEDS Skin   Limited Assist Verbally Normal                       Personal Care Assistance Level of Assistance  Bathing,Feeding,Dressing Bathing Assistance: Limited assistance Feeding assistance: Limited assistance Dressing Assistance: Limited assistance     Functional Limitations Info  Sight,Hearing,Speech Sight Info: Adequate Hearing Info: Adequate Speech Info: Adequate    SPECIAL CARE FACTORS FREQUENCY  PT (By licensed PT),OT (By licensed OT)     PT Frequency: 5 x week OT Frequency: 5 x week            Contractures Contractures Info: Not present    Additional Factors Info  Code Status,Allergies,Isolation Precautions Code  Status Info: Full code Allergies Info: NKDA     Isolation Precautions Info: Tested positive for COVID on 1/1.     Current Medications (05/22/2020):  This is the current hospital active medication list Current Facility-Administered Medications  Medication Dose Route Frequency Provider Last Rate Last Admin  . acetaminophen (TYLENOL) tablet 650 mg  650 mg Oral Q6H PRN Jacques Navy, MD   650 mg at 05/20/20 0215  . amLODipine (NORVASC) tablet 10 mg  10 mg Oral Daily Marrion Coy, MD   10 mg at 05/22/20 0858  . ascorbic acid (VITAMIN C) tablet 500 mg  500 mg Oral Daily Drema Dallas, MD   500 mg at 05/22/20 0858  . aspirin EC tablet 81 mg  81 mg Oral Daily Bhagat, Srishti L, MD   81 mg at 05/22/20 0858  . chlorpheniramine-HYDROcodone (TUSSIONEX) 10-8 MG/5ML suspension 5 mL  5 mL Oral Q12H PRN Norins, Rosalyn Gess, MD      . clopidogrel (PLAVIX) tablet 75 mg  75 mg Oral Daily Bhagat, Srishti L, MD   75 mg at 05/22/20 0858  . dexamethasone (DECADRON) tablet 6 mg  6 mg Oral Daily Marrion Coy, MD   6 mg at 05/22/20 0858  . enoxaparin (LOVENOX) injection 40 mg  40 mg Subcutaneous Q24H Marrion Coy, MD   40 mg at 05/21/20 2245  . guaiFENesin-dextromethorphan (ROBITUSSIN DM) 100-10 MG/5ML syrup 10 mL  10 mL Oral Q4H PRN Norins, Rosalyn Gess, MD   10 mL at 05/21/20 1101  . hydrALAZINE (APRESOLINE) injection 10 mg  10 mg Intravenous Q6H PRN Manuela Schwartz, NP   10 mg at 05/22/20 0539  . influenza vac split quadrivalent PF (FLUARIX) injection 0.5 mL  0.5 mL Intramuscular Tomorrow-1000 Marrion Coy, MD      . insulin aspart (novoLOG) injection 0-15 Units  0-15 Units Subcutaneous TID WC Norins, Rosalyn Gess, MD   2 Units at 05/22/20 1254  . insulin detemir (LEVEMIR) injection 5 Units  0.075 Units/kg Subcutaneous BID Norins, Rosalyn Gess, MD   5 Units at 05/22/20 [phone]  . iohexol (OMNIPAQUE) 350 MG/ML injection 50 mL  50 mL Intravenous Once PRN Bhagat, Srishti L, MD      . Ipratropium-Albuterol (COMBIVENT) respimat  1 puff  1 puff Inhalation Q6H Norins, Rosalyn Gess, MD   1 puff at 05/22/20 1255  . labetalol (NORMODYNE) injection 20 mg  20 mg Intravenous Q3H PRN Mansy, Jan A, MD   20 mg at 05/21/20 2245  . senna (SENOKOT) tablet 8.6 mg  1 tablet Oral BID Jacques Navy, MD   8.6 mg at 05/22/20 0857  . traMADol (ULTRAM) tablet 50 mg  50 mg Oral Q6H PRN Marrion Coy, MD   50 mg at 05/22/20 0539  . traZODone (DESYREL) tablet 25 mg  25 mg Oral QHS PRN Norins, Rosalyn Gess, MD   25 mg at 05/21/20 0300  . zinc sulfate capsule 220 mg  220 mg Oral Daily Drema Dallas, MD   220 mg at 05/22/20 4970     Discharge Medications: Please see discharge summary for a list of discharge medications.  Relevant Imaging Results:  Relevant Lab Results:   Additional Information SS#: [id number]  Margarito Liner, LCSW

## 2020-05-22 NOTE — Progress Notes (Signed)
Inpatient Rehab Admissions Coordinator Note:   Per CSW request, pt was screened for CIR candidacy by Estill Dooms, PT, DPT.  At this time it appears pt could be a candidate for CIR.  Per Charlynn Court, family could potentially provide supervision at discharge.  We are recommending a CIR consult so we can better evaluate pt for candidacy and I will place an order per our protocol.  An admissions coordinator will follow up.  Please contact me with questions.   Estill Dooms, PT, DPT [phone] 05/22/20 1:39 PM

## 2020-05-23 ENCOUNTER — Encounter
Admission: EM | Disposition: A | Discharge status: Home Health | Payer: Self-pay | Source: Home / Self Care | Attending: Internal Medicine

## 2020-05-23 ENCOUNTER — Inpatient Hospital Stay: Payer: HRSA Program | Admitting: Certified Registered"

## 2020-05-23 ENCOUNTER — Inpatient Hospital Stay: Payer: HRSA Program

## 2020-05-23 ENCOUNTER — Encounter: Payer: Self-pay | Admitting: Internal Medicine

## 2020-05-23 DIAGNOSIS — S0300XA Dislocation of jaw, unspecified side, initial encounter: Secondary | ICD-10-CM

## 2020-05-23 HISTORY — PX: CLOSED REDUCTION MANDIBLE: SHX5307

## 2020-05-23 LAB — COMPREHENSIVE METABOLIC PANEL
ALT: 12 U/L (ref 0–44)
AST: 14 U/L — ABNORMAL LOW (ref 15–41)
Albumin: 2.9 g/dL — ABNORMAL LOW (ref 3.5–5.0)
Alkaline Phosphatase: 51 U/L (ref 38–126)
Anion gap: 9 (ref 5–15)
BUN: 38 mg/dL — ABNORMAL HIGH (ref 8–23)
CO2: 26 mmol/L (ref 22–32)
Calcium: 9.8 mg/dL (ref 8.9–10.3)
Chloride: 104 mmol/L (ref 98–111)
Creatinine, Ser: 1.13 mg/dL — ABNORMAL HIGH (ref 0.44–1.00)
GFR, Estimated: 55 mL/min — ABNORMAL LOW (ref >60)
Glucose, Bld: 94 mg/dL (ref 70–99)
Potassium: 4 mmol/L (ref 3.5–5.1)
Sodium: 139 mmol/L (ref 135–145)
Total Bilirubin: 0.7 mg/dL (ref 0.3–1.2)
Total Protein: 6.2 g/dL — ABNORMAL LOW (ref 6.5–8.1)

## 2020-05-23 LAB — CBC WITH DIFFERENTIAL/PLATELET
Abs Immature Granulocytes: 0.38 10*3/uL — ABNORMAL HIGH (ref 0.00–0.07)
Basophils Absolute: 0 10*3/uL (ref 0.0–0.1)
Basophils Relative: 0 %
Eosinophils Absolute: 0 10*3/uL (ref 0.0–0.5)
Eosinophils Relative: 0 %
HCT: 39 % (ref 36.0–46.0)
Hemoglobin: 12.8 g/dL (ref 12.0–15.0)
Immature Granulocytes: 5 %
Lymphocytes Relative: 15 %
Lymphs Abs: 1.1 10*3/uL (ref 0.7–4.0)
MCH: 27.9 pg (ref 26.0–34.0)
MCHC: 32.8 g/dL (ref 30.0–36.0)
MCV: 85 fL (ref 80.0–100.0)
Monocytes Absolute: 1.2 10*3/uL — ABNORMAL HIGH (ref 0.1–1.0)
Monocytes Relative: 16 %
Neutro Abs: 4.9 10*3/uL (ref 1.7–7.7)
Neutrophils Relative %: 64 %
Platelets: 303 10*3/uL (ref 150–400)
RBC: 4.59 MIL/uL (ref 3.87–5.11)
RDW: 13.2 % (ref 11.5–15.5)
WBC: 7.7 10*3/uL (ref 4.0–10.5)
nRBC: 0 % (ref 0.0–0.2)

## 2020-05-23 LAB — D-DIMER, QUANTITATIVE: D-Dimer, Quant: 0.64 ug/mL-FEU — ABNORMAL HIGH (ref 0.00–0.50)

## 2020-05-23 LAB — GLUCOSE, CAPILLARY
Glucose-Capillary: 115 mg/dL — ABNORMAL HIGH (ref 70–99)
Glucose-Capillary: 99 mg/dL (ref 70–99)
Glucose-Capillary: 123 mg/dL — ABNORMAL HIGH (ref 70–99)
Glucose-Capillary: 133 mg/dL — ABNORMAL HIGH (ref 70–99)

## 2020-05-23 LAB — FERRITIN: Ferritin: 458 ng/mL — ABNORMAL HIGH (ref 11–307)

## 2020-05-23 LAB — LACTATE DEHYDROGENASE: LDH: 157 U/L (ref 98–192)

## 2020-05-23 LAB — C-REACTIVE PROTEIN: CRP: 1 mg/dL — ABNORMAL HIGH (ref <1.0)

## 2020-05-23 LAB — PHOSPHORUS: Phosphorus: 3.5 mg/dL (ref 2.5–4.6)

## 2020-05-23 LAB — MAGNESIUM: Magnesium: 2.6 mg/dL — ABNORMAL HIGH (ref 1.7–2.4)

## 2020-05-23 SURGERY — CLOSED REDUCTION, MANDIBLE
Anesthesia: General | Laterality: Bilateral

## 2020-05-23 MED ORDER — SODIUM CHLORIDE 0.9 % IV SOLN: INTRAVENOUS | Status: DC

## 2020-05-23 MED ORDER — PROPOFOL 10 MG/ML IV BOLUS
INTRAVENOUS | Status: DC | PRN
  Administered 2020-05-23: 70 mg via INTRAVENOUS

## 2020-05-23 MED ORDER — PROPOFOL 10 MG/ML IV BOLUS
INTRAVENOUS | Status: AC
  Filled 2020-05-23: qty 20

## 2020-05-23 MED ORDER — MORPHINE SULFATE (PF) 2 MG/ML IV SOLN
2.0000 mg | INTRAVENOUS | Status: AC
  Administered 2020-05-23: 2 mg via INTRAVENOUS
  Filled 2020-05-23: qty 1

## 2020-05-23 MED ORDER — LIDOCAINE HCL (PF) 2 % IJ SOLN
INTRAMUSCULAR | Status: AC
  Filled 2020-05-23: qty 5

## 2020-05-23 MED ORDER — HYDRALAZINE HCL 10 MG PO TABS
5.0000 mg | ORAL_TABLET | Freq: Three times a day (TID) | ORAL | Status: DC
  Administered 2020-05-23 – 2020-05-25 (×6): 5 mg via ORAL
  Filled 2020-05-23 (×9): qty 1

## 2020-05-23 NOTE — Consult Note (Signed)
Stephanie [name], Stephanie [name] [id number] June 04, 1955 Drema Dallas, MD  Reason for Consult: Evaluate because of jaw dislocation  HPI: Patient is a 65 year old African-American female who presented with progressive weakness dyspnea and difficulty doing her normal living.  She was admitted to the hospital with a working diagnosis of acute hypoxic respiratory failure due to SARS COVID-19 viral pneumonia.  She had been treated with medication and is improving.  She did have some acute ischemic left-sided stroke that is clinically improving.  She has diabetes that is being controlled with a sliding scale currently.  She still is on airborne isolation because of COVID.  This morning she was just sitting in her bed and open her mouth wide and her jaw dislocated on both sides.  This is never happened to her before and she was not doing anything unusual when it happened.  Her mouth has been open all day long and she has been unable to eat at all.  She has had a couple of sips of liquids this evening until we stopped her and told her not to have anymore.  She had a little bit of liquid this noon but has not had any more through the day.  Allergies: No Known Allergies  ROS: Review of systems normal other than 12 systems except per HPI.  PMH:  Past Medical History:  Diagnosis Date  . Diabetes mellitus without complication (HCC)     FH: History reviewed. No pertinent family history.  SH:  Social History   Socioeconomic History  . Marital status: Single    Spouse name: Not on file  . Number of children: Not on file  . Years of education: Not on file  . Highest education level: Not on file  Occupational History  . Not on file  Tobacco Use  . Smoking status: Former Smoker    Quit date: 04/2020    Years since quitting: 0.1  . Smokeless tobacco: Never Used  Vaping Use  . Vaping Use: Never used  Substance and Sexual Activity  . Alcohol use: Not on file  . Drug use: Not on file  . Sexual activity: Not on  file  Other Topics Concern  . Not on file  Social History Narrative  . Not on file   Social Determinants of Health   Financial Resource Strain: Not on file  Food Insecurity: Not on file  Transportation Needs: Not on file  Physical Activity: Not on file  Stress: Not on file  Social Connections: Not on file  Intimate Partner Violence: Not on file    PSH: History reviewed. No pertinent surgical history.  Physical  Exam: The patient is sitting with her mouth open widely.  She is unable to close her mouth or really move her jaw much at all.  She can get a little wider open but cannot close it.  There are no oral lesions.  She has good dentition.  Her neck is negative for any nodes or masses.   A/P: She has bilateral dislocation of her jaw that is never happened to her before.  She needs relaxation of her masticator muscles so that we can relocate the jaw for her.  Once this is relocated it should stay in position as long as she does not open her mouth really wide.  I have explained this to her and she is willing to proceed.  Informed surgical request is signed.  I cannot guarantee to her that she will not read dislocated in the future.   Renae Fickle  H Avant Printy 05/23/2020 6:02 PM

## 2020-05-23 NOTE — Progress Notes (Addendum)
PROGRESS NOTE    Stephanie [name]  QPY:[id number] DOB: June 29, 1955 DOA: 05/17/2020 PCP: Pcp, No     Brief Narrative:  65 y.o.BF PMHx Hysterectomy.She reports that she has felt ill for about 3 weeks. Initially she had diarrhea which did clear. However, she has had progressive weakness to the point where it is difficult to manage ADLs and she has had progressive SOB with a non-productive cough. Due to her symptoms she presents to [facility] for evaluation. Upon arriving the hospital, she was found to be Covid positive. Chest x-ray showed patchyinfiltrates. She was started on IV steroids, remdesivir. 1/2.MRI showed acute stroke.  Giving IV Lasix for exacerbation of congestive heart failure.   Subjective: 1/7 A/O x4, patient with continued slurred speech. Paged to bedside early this a.m. secondary to patient unable to open and close her jaw appropriately, accompanied by extreme pain   Assessment & Plan: Covid vaccination; unvaccinated   Active Problems:   Pneumonia due to COVID-19 virus   Dehydration   Acute ischemic left ACA stroke ([place])   Acute hypoxemic respiratory failure (HCC)  Acute respiratory failure with hypoxia/Covid 19 pneumonia COVID-19 Labs  Recent Labs    05/21/20 0351 05/22/20 0500 05/23/20 0333  DDIMER  --  0.68*  --   FERRITIN  --  467* 458*  LDH  --  167  --   CRP 2.8* 1.5*  --     Lab Results  Component Value Date   SARSCOV2NAA POSITIVE (A) 05/17/2020  -Multilobe pneumonia secondary to COVID-19. -Remdesivir per pharmacy protocol -Decadron 6 mg daily -Vitamins per Covid protocol  Acute on chronic CHF  -Ruled in  -Strict in and out -1.3 L -Daily weight Filed Weights   05/17/20 2141  Weight: 65.8 kg  -Amlodipine 10 mg daily - Hydralazine p.o. 5 mg TID -Hydralazine PRN - 1/7 normal saline 50 ml/hr  Essential HTN -See CHF  Acute ischemic stroke Right ICA aneurysm. Patient is seen by neurology.  Currently on aspirin and Plavix. Patient  will need follow-up on ICA aneurysm as outpatient. Patient will also need follow-up with cardiology as outpatient for loop recorder. Patient currently still has significant weakness, physical therapy/Occupational Therapy recommended SNF placement.  Acute kidney injury Secondary to Covid. Renal function has improved. Lab Results  Component Value Date   CREATININE 1.13 (H) 05/23/2020   CREATININE 1.26 (H) 05/22/2020   CREATININE 1.06 (H) 05/21/2020   CREATININE 1.10 (H) 05/20/2020   CREATININE 0.97 05/19/2020   Anterior dislocation of the mandibular condyles -1/7 consulted ENT Dr Kathyrn Sheriff who stated he will see patient later this evening after he completes office hours.    DVT prophylaxis: Lovenox Code Status:  Family Communication: 1/7 spoke with Astrid Divine patient's POC discussed plan of care answered all questions status is: Inpatient    Dispo: The patient is from: Home  Anticipated d/c is to: SNF  Anticipated d/c date is: 3 days  Patient currently is not medically stable to d/c.     Consultants:  Neurology ENT Dr Kathyrn Sheriff     Procedures/Significant Events:  1/3 Echocardiogram;Left Ventricle: Left ventricular ejection fraction, by estimation, is 55  to 60%. The left ventricle has normal function. The left ventricle has no  regional wall motion abnormalities. The left ventricular internal cavity  size was normal in size. There is  mild left ventricular hypertrophy. Left ventricular diastolic parameters  are consistent with Grade I diastolic dysfunction (impaired relaxation).   Right Ventricle: The right ventricular size is normal. No increase in  right ventricular wall thickness. Right ventricular systolic function is  normal. Tricuspid regurgitation signal is inadequate for assessing PA  pressure.   Left Atrium: Left atrial size was mildly dilated.   Right Atrium: Right atrial size was normal in size.   Pericardium:  There is no evidence of pericardial effusion.   Mitral Valve: The mitral valve is normal in structure. No evidence of  mitral valve regurgitation. No evidence of mitral valve stenosis.   Tricuspid Valve: The tricuspid valve is normal in structure. Tricuspid  valve regurgitation is not demonstrated. No evidence of tricuspid  stenosis.   Aortic Valve: The aortic valve is normal in structure. Aortic valve  regurgitation is not visualized. No aortic stenosis is present. Aortic  valve mean gradient measures 6.5 mmHg. Aortic valve peak gradient measures  10.0 mmHg. Aortic valve area, by VTI  measures 2.50 cm.   Pulmonic Valve: The pulmonic valve was normal in structure. Pulmonic valve  regurgitation is not visualized. No evidence of pulmonic stenosis.   Aorta: The aortic root is normal in size and structure.   Venous: The inferior vena cava was not well visualized.   I have personally reviewed and interpreted all radiology studies and my findings are as above.  VENTILATOR SETTINGS: Room air 1/5 SPO2 95%   Cultures   Antimicrobials: Anti-infectives (From admission, onward)   Start     Ordered Stop   05/22/20 1000  remdesivir 100 mg in sodium chloride 0.9 % 100 mL IVPB        05/21/20 1347     05/20/20 1600  remdesivir 100 mg in sodium chloride 0.9 % 100 mL IVPB  Status:  Discontinued        05/20/20 1442 05/21/20 1346   05/18/20 1000  remdesivir 100 mg in sodium chloride 0.9 % 100 mL IVPB       "Followed by" Linked Group Details   05/18/20 0014 05/22/20 0959   05/18/20 0015  remdesivir 200 mg in sodium chloride 0.9% 250 mL IVPB       "Followed by" Linked Group Details   05/18/20 0014 05/18/20 0232   05/17/20 2300  cefTRIAXone (ROCEPHIN) 1 g in sodium chloride 0.9 % 100 mL IVPB        05/17/20 2249 05/18/20 0022   05/17/20 2300  azithromycin (ZITHROMAX) 500 mg in sodium chloride 0.9 % 250 mL IVPB        05/17/20 2249 05/18/20 0235       Devices    LINES / TUBES:       Continuous Infusions:    Objective: Vitals:   05/22/20 2027 05/22/20 2351 05/23/20 0348 05/23/20 0800  BP: (!) 146/77 (!) 135/98 (!) 157/84 (!) 178/83  Pulse: (!) 53 (!) 58 (!) 54 60  Resp: 18 20 20 20   Temp: 97.8 F (36.6 C) (!) 97.3 F (36.3 C) (!) 97.3 F (36.3 C) (!) 97.4 F (36.3 C)  TempSrc: Oral Oral Oral   SpO2: 95% 94% 95% 100%  Weight:      Height:        Intake/Output Summary (Last 24 hours) at 05/23/2020 0855 Last data filed at 05/23/2020 0354 Gross per 24 hour  Intake 280 ml  Output 1560 ml  Net -1280 ml   Filed Weights   05/17/20 2141  Weight: 65.8 kg    Examination:  General: A/O x4, No acute respiratory distress Eyes: negative scleral hemorrhage, negative anisocoria, negative icterus ENT: Negative Runny nose, negative gingival bleeding, patient unable to fully  close her mouth, pain to palpation along bilateral condylar joint RIGHT>>> LEFT. Feels to be slightly offset Neck:  Negative scars, masses, torticollis, lymphadenopathy, JVD Lungs: Clear to auscultation bilaterally without wheezes or crackles Cardiovascular: Regular rate and rhythm without murmur gallop or rub normal S1 and S2 Abdomen: negative abdominal pain, nondistended, positive soft, bowel sounds, no rebound, no ascites, no appreciable mass Extremities: No significant cyanosis, clubbing, or edema bilateral lower extremities Skin: Negative rashes, lesions, ulcers Psychiatric:  Negative depression, negative anxiety, negative fatigue, negative mania  Central nervous system:  Cranial nerves II through XII intact, tongue/uvula midline, all extremities muscle strength 5/5, sensation intact throughout, finger nose finger bilateral past-pointing, quick finger touch bilateral difficult to accomplish, positive dysarthria, negative expressive aphasia, negative receptive aphasia.  .     Data Reviewed: Care during the described time interval was provided by me .  I have reviewed this patient's  available data, including medical history, events of note, physical examination, and all test results as part of my evaluation.  CBC: Recent Labs  Lab 05/17/20 2206 05/19/20 0515 05/20/20 0412 05/21/20 0351 05/22/20 0500  WBC 5.6 10.4 11.5* 9.7 9.0  NEUTROABS 4.4 8.4* 9.5* 7.8* 6.7  HGB 13.5 13.1 14.3 12.7 12.5  HCT 40.6 40.0 42.6 38.3 37.5  MCV 86.2 88.5 84.0 83.6 84.5  PLT 151 196 286 262 276   Basic Metabolic Panel: Recent Labs  Lab 05/17/20 2206 05/19/20 0515 05/20/20 0412 05/21/20 0351 05/22/20 0500 05/23/20 0333  NA 138 136 138 139 140 139  K 3.8 3.9 3.5 3.6 3.8 4.0  CL 100 107 100 104 105 104  CO2 26 23 26 25 28 26   GLUCOSE 121* 141* 141* 121* 105* 94  BUN 29* 28* 27* 33* 41* 38*  CREATININE 1.52* 0.97 1.10* 1.06* 1.26* 1.13*  CALCIUM 10.5* 9.9 10.6* 10.4* 10.2 9.8  MG 2.1 1.8 1.7  --  2.4  --   PHOS  --   --   --   --  3.2  --    GFR: Estimated Creatinine Clearance: 52.9 mL/min (A) (by C-G formula based on SCr of 1.13 mg/dL (H)). Liver Function Tests: Recent Labs  Lab 05/19/20 0515 05/20/20 0412 05/21/20 0351 05/22/20 0500 05/23/20 0333  AST 19 16 14* 13* 14*  ALT 13 15 12 11 12   ALKPHOS 51 64 55 56 51  BILITOT 0.5 0.7 0.8 0.8 0.7  PROT 6.1* 7.3 6.5 6.4* 6.2*  ALBUMIN 2.9* 3.3* 3.0* 3.0* 2.9*   No results for input(s): LIPASE, AMYLASE in the last 168 hours. Recent Labs  Lab 05/17/20 2231  AMMONIA 11   Coagulation Profile: Recent Labs  Lab 05/17/20 2206  INR 1.0   Cardiac Enzymes: Recent Labs  Lab 05/17/20 2206  CKTOTAL 38   BNP (last 3 results) No results for input(s): PROBNP in the last 8760 hours. HbA1C: No results for input(s): HGBA1C in the last 72 hours. CBG: Recent Labs  Lab 05/21/20 2022 05/22/20 0742 05/22/20 1150 05/22/20 1703 05/22/20 2028  GLUCAP 127* 104* 134* 112* 137*   Lipid Profile: No results for input(s): CHOL, HDL, LDLCALC, TRIG, CHOLHDL, LDLDIRECT in the last 72 hours. Thyroid Function Tests: No  results for input(s): TSH, T4TOTAL, FREET4, T3FREE, THYROIDAB in the last 72 hours. Anemia Panel: Recent Labs    05/22/20 0500 05/23/20 0333  FERRITIN 467* 458*   Sepsis Labs: Recent Labs  Lab 05/17/20 2206 05/19/20 0515 05/20/20 0412  PROCALCITON 0.19 <0.10 0.16    Recent Results (from  the past 240 hour(s))  Resp Panel by RT-PCR (Flu A&B, Covid) Nasopharyngeal Swab     Status: Abnormal   Collection Time: 05/17/20 10:31 PM   Specimen: Nasopharyngeal Swab; Nasopharyngeal(NP) swabs in vial transport medium  Result Value Ref Range Status   SARS Coronavirus 2 by RT PCR POSITIVE (A) NEGATIVE Final    Comment: RESULT CALLED TO, READ BACK BY AND VERIFIED WITH: jenna elington @2350  on 05/17/20 skl (NOTE) SARS-CoV-2 target nucleic acids are DETECTED.  The SARS-CoV-2 RNA is generally detectable in upper respiratory specimens during the acute phase of infection. Positive results are indicative of the presence of the identified virus, but do not rule out bacterial infection or co-infection with other pathogens not detected by the test. Clinical correlation with patient history and other diagnostic information is necessary to determine patient infection status. The expected result is Negative.  Fact Sheet for Patients: 07/15/20  Fact Sheet for Healthcare Providers: BloggerCourse.com  This test is not yet approved or cleared by the [place] FDA and  has been authorized for detection and/or diagnosis of SARS-CoV-2 by FDA under an Emergency Use Authorization (EUA).  This EUA will remain in effect (meaning this test can  be used) for the duration of  the COVID-19 declaration under Section 564(b)(1) of the Act, 21 U.S.C. section 360bbb-3(b)(1), unless the authorization is terminated or revoked sooner.     Influenza A by PCR NEGATIVE NEGATIVE Final   Influenza B by PCR NEGATIVE NEGATIVE Final    Comment: (NOTE) The Xpert  Xpress SARS-CoV-2/FLU/RSV plus assay is intended as an aid in the diagnosis of influenza from Nasopharyngeal swab specimens and should not be used as a sole basis for treatment. Nasal washings and aspirates are unacceptable for Xpert Xpress SARS-CoV-2/FLU/RSV testing.  Fact Sheet for Patients: [place]  Fact Sheet for Healthcare Providers: BloggerCourse.com  This test is not yet approved or cleared by the [employer] FDA and has been authorized for detection and/or diagnosis of SARS-CoV-2 by FDA under an Emergency Use Authorization (EUA). This EUA will remain in effect (meaning this test can be used) for the duration of the COVID-19 declaration under Section 564(b)(1) of the Act, 21 U.S.C. section 360bbb-3(b)(1), unless the authorization is terminated or revoked.  Performed at [facility], [address] [place], [place]   Blood culture (routine x 2)     Status: None   Collection Time: 05/17/20 10:50 PM   Specimen: BLOOD  Result Value Ref Range Status   Specimen Description BLOOD BLOOD RIGHT FOREARM  Final   Special Requests   Final    BOTTLES DRAWN AEROBIC AND ANAEROBIC Blood Culture results may not be optimal due to an excessive volume of blood received in culture bottles   Culture   Final    NO GROWTH 5 DAYS Performed at [facility], [address] [place], [place]    Report Status 05/22/2020 FINAL  Final  Blood culture (routine x 2)     Status: None   Collection Time: 05/17/20 10:55 PM   Specimen: BLOOD  Result Value Ref Range Status   Specimen Description BLOOD BLOOD LEFT FOREARM  Final   Special Requests   Final    BOTTLES DRAWN AEROBIC AND ANAEROBIC Blood Culture adequate volume   Culture   Final    NO GROWTH 5 DAYS Performed at [facility], [address], [place], [place]    Report Status 05/22/2020 FINAL  Final  Radiology  Studies: No results found.      Scheduled Meds: . amLODipine  10 mg Oral Daily  . vitamin C  500 mg Oral Daily  . aspirin EC  81 mg Oral Daily  . clopidogrel  75 mg Oral Daily  . dexamethasone  6 mg Oral Daily  . influenza vac split quadrivalent PF  0.5 mL Intramuscular Tomorrow-1000  . insulin aspart  0-15 Units Subcutaneous TID WC  . insulin detemir  0.075 Units/kg Subcutaneous BID  . Ipratropium-Albuterol  1 puff Inhalation Q6H  .  morphine injection  2 mg Intravenous STAT  . senna  1 tablet Oral BID  . zinc sulfate  220 mg Oral Daily   Continuous Infusions:    LOS: 5 days    Time spent:40 min    Jawaan Adachi, Roselind Messier, MD [facility] Pager [phone]  If 7PM-7AM, please contact night-coverage [email or web address] Password [facility] 05/23/2020, 8:55 AM

## 2020-05-23 NOTE — Anesthesia Preprocedure Evaluation (Addendum)
Anesthesia Evaluation  Patient identified by MRN, date of birth, ID band Patient awake    Reviewed: Allergy & Precautions, H&P , NPO status , Patient's Chart, lab work & pertinent test results  History of Anesthesia Complications Negative for: history of anesthetic complications  Airway Mallampati: III  TM Distance: <3 FB Neck ROM: limited    Dental  (+) Chipped   Pulmonary neg shortness of breath, pneumonia, former smoker,    + rhonchi  + decreased breath sounds      Cardiovascular Exercise Tolerance: Good (-) angina(-) Past MI negative cardio ROS Normal cardiovascular exam     Neuro/Psych CVA, Residual Symptoms negative psych ROS   GI/Hepatic negative GI ROS, Neg liver ROS,   Endo/Other  diabetes, Type 2  Renal/GU negative Renal ROS  negative genitourinary   Musculoskeletal   Abdominal   Peds  Hematology negative hematology ROS (+)   Anesthesia Other Findings COVID 19 +  Past Medical History: No date: Diabetes mellitus without complication (HCC)  History reviewed. No pertinent surgical history.  BMI    Body Mass Index: 19.67 kg/m      Reproductive/Obstetrics negative OB ROS                           Anesthesia Physical Anesthesia Plan  ASA: III and emergent  Anesthesia Plan: General   Post-op Pain Management:    Induction: Intravenous  PONV Risk Score and Plan: Propofol infusion and TIVA  Airway Management Planned: Natural Airway and Nasal Cannula  Additional Equipment:   Intra-op Plan:   Post-operative Plan:   Informed Consent: I have reviewed the patients History and Physical, chart, labs and discussed the procedure including the risks, benefits and alternatives for the proposed anesthesia with the patient or authorized representative who has indicated his/her understanding and acceptance.     Dental Advisory Given  Plan Discussed with: Anesthesiologist,  CRNA and Surgeon  Anesthesia Plan Comments: (Patient consented for risks of anesthesia including but not limited to:  - adverse reactions to medications - risk of airway placement if required - damage to eyes, teeth, lips or other oral mucosa - nerve damage due to positioning  - sore throat or hoarseness - Damage to heart, brain, nerves, lungs, other parts of body or loss of life  Patient voiced understanding.)        Anesthesia Quick Evaluation

## 2020-05-23 NOTE — Progress Notes (Signed)
Assessed patient this morning as usual, assisted her to the [facility] and she urinated without difficulty, got her back in bed no issues and once she laid back and started arranging her tray table, she clutched her jaw and started moaning and cried out, "My jaw just came out of place" and continued moaning in pain. Pt's jaw was clearly thrust forward and she was holding it in pain. I immediately called the charge nurse and told her what was going on while staying and comforting the patient and charge nurse called the attending doctor making clear that the patient was in distress pain-wise, no respiratory distress. I stayed with the patient and put a warm washcloth on her jaw to relax the muscles.   Pt now laying on her side holding warm washcloth in place, exhausted from the pain, stable but still in pain. I just gave the 2mg  IV morphine. Doctor is contacting someone to come put her jaw back in place.   Will cont to monitor the patient closely. Pt states this has never happened before.

## 2020-05-23 NOTE — Transfer of Care (Signed)
Immediate Anesthesia Transfer of Care Note  Patient: Stephanie [name]  Procedure(s) Performed: CLOSED REDUCTION MANDIBULAR (Bilateral )  Patient Location: Nursing Unit  Anesthesia Type:General  Level of Consciousness: awake, alert  and oriented  Airway & Oxygen Therapy: Patient Spontanous Breathing  Post-op Assessment: Report given to RN and Post -op Vital signs reviewed and stable  Post vital signs: Reviewed  Last Vitals:  Vitals Value Taken Time  BP 143/81 05/23/20 1856  Temp 36.1 C 05/23/20 1856  Pulse 66 05/23/20 1856  Resp 16 05/23/20 1856  SpO2 95 % 05/23/20 1856    Last Pain:  Vitals:   05/23/20 1720  TempSrc: Oral  PainSc:          Complications: No complications documented.

## 2020-05-23 NOTE — Progress Notes (Signed)
Inpatient Rehabilitation Admissions Coordinator  Inpatient rehab consult received. Noted COVID + 05/17/2020. Patients are eligible to be considered for admit to the [facility] when cleared from airborne precautions by acute MD or Infectious disease. Otherwise they will need to be >20 days from their positive test with recovery/improvement in symptoms ( 1/22) or 2 negative tests. Please call me with any questions. I will follow. I have alerted SW.   Ottie Glazier, RN, MSN Rehab Admissions Coordinator [phone] 01/22/2020 12:39 PM

## 2020-05-23 NOTE — Progress Notes (Signed)
Pt stating the morphine is "definitely helping". Asked her if I could call her family and let them know what's going on and she emphatically states, "no". Doctor Joseph Art putting in orders now.

## 2020-05-23 NOTE — Op Note (Signed)
05/23/2020  6:51 PM    Stephanie [name]  [id number]   Pre-Op Dx: Bilateral mandibular dislocation  Post-op Dx: Same  Proc: Closed reduction of mandibular dislocation bilaterally  Surg:  Cammy Copa  Anes: IV sedation  EBL: None  Comp: None  Findings: Bilateral mandibular dislocation  Procedure: Patient was brought to the operating room and placed in a reclining position.  She was on nasal O2.  She was given IV sedation with propofol until the patient fell asleep.  I was able to place my thumbs over her posterior lower teeth using gauze to help protect my thumbs.  I then pulled downward on her jaw and displaced it backwards into its normal position and that relocated the condyles into their normal groove.  The jaw now closed normally and the patient tolerated this very well.  All sedation was stopped and patient was slowly awakened and taken to the recovery room in satisfactory condition.  There were no operative complications.  Dispo:   To PACU to be sent back to her room.  Plan: She does not need any specific treatment of her jaw and hopefully she will not dislocated again.  This is not something that is happened before and is more of a chance occurrence.  She will try not to yawn or open her mouth widely for the next week to allow her mandibular joint tendons relax.  She does not need a follow-up visit in my office but can follow-up with her dentist as needed.  Cammy Copa  05/23/2020 6:51 PM

## 2020-05-23 NOTE — TOC Progression Note (Signed)
Transition of Care [facility]) - Progression Note    Patient Details  Name: Stephanie [name] MRN: [id number] Date of Birth: 17-May-1956  Transition of Care [facility] CM/SW Contact  Margarito Liner, LCSW Phone Number: 05/23/2020, 3:49 PM  Clinical Narrative:  No SNF bed offers at this time.   Expected Discharge Plan: Home w Home Health Services Barriers to Discharge: Continued Medical Work up  Expected Discharge Plan and Services Expected Discharge Plan: Home w Home Health Services     Post Acute Care Choice: Home Health Living arrangements for the past 2 months: Apartment                                       Social Determinants of Health (SDOH) Interventions    Readmission Risk Interventions No flowsheet data found.

## 2020-05-24 LAB — COMPREHENSIVE METABOLIC PANEL
ALT: 13 U/L (ref 0–44)
AST: 15 U/L (ref 15–41)
Albumin: 3.2 g/dL — ABNORMAL LOW (ref 3.5–5.0)
Alkaline Phosphatase: 59 U/L (ref 38–126)
Anion gap: 9 (ref 5–15)
BUN: 36 mg/dL — ABNORMAL HIGH (ref 8–23)
CO2: 26 mmol/L (ref 22–32)
Calcium: 10.4 mg/dL — ABNORMAL HIGH (ref 8.9–10.3)
Chloride: 103 mmol/L (ref 98–111)
Creatinine, Ser: 1.08 mg/dL — ABNORMAL HIGH (ref 0.44–1.00)
GFR, Estimated: 58 mL/min — ABNORMAL LOW (ref >60)
Glucose, Bld: 100 mg/dL — ABNORMAL HIGH (ref 70–99)
Potassium: 4.2 mmol/L (ref 3.5–5.1)
Sodium: 138 mmol/L (ref 135–145)
Total Bilirubin: 0.9 mg/dL (ref 0.3–1.2)
Total Protein: 6.4 g/dL — ABNORMAL LOW (ref 6.5–8.1)

## 2020-05-24 LAB — CBC WITH DIFFERENTIAL/PLATELET
Abs Immature Granulocytes: 0.34 10*3/uL — ABNORMAL HIGH (ref 0.00–0.07)
Basophils Absolute: 0 10*3/uL (ref 0.0–0.1)
Basophils Relative: 0 %
Eosinophils Absolute: 0 10*3/uL (ref 0.0–0.5)
Eosinophils Relative: 0 %
HCT: 39.5 % (ref 36.0–46.0)
Hemoglobin: 13.2 g/dL (ref 12.0–15.0)
Immature Granulocytes: 5 %
Lymphocytes Relative: 12 %
Lymphs Abs: 0.8 10*3/uL (ref 0.7–4.0)
MCH: 28.5 pg (ref 26.0–34.0)
MCHC: 33.4 g/dL (ref 30.0–36.0)
MCV: 85.3 fL (ref 80.0–100.0)
Monocytes Absolute: 0.9 10*3/uL (ref 0.1–1.0)
Monocytes Relative: 13 %
Neutro Abs: 4.8 10*3/uL (ref 1.7–7.7)
Neutrophils Relative %: 70 %
Platelets: 337 10*3/uL (ref 150–400)
RBC: 4.63 MIL/uL (ref 3.87–5.11)
RDW: 13.4 % (ref 11.5–15.5)
WBC: 6.9 10*3/uL (ref 4.0–10.5)
nRBC: 0 % (ref 0.0–0.2)

## 2020-05-24 LAB — D-DIMER, QUANTITATIVE: D-Dimer, Quant: 0.69 ug/mL-FEU — ABNORMAL HIGH (ref 0.00–0.50)

## 2020-05-24 LAB — GLUCOSE, CAPILLARY
Glucose-Capillary: 92 mg/dL (ref 70–99)
Glucose-Capillary: 121 mg/dL — ABNORMAL HIGH (ref 70–99)
Glucose-Capillary: 120 mg/dL — ABNORMAL HIGH (ref 70–99)
Glucose-Capillary: 137 mg/dL — ABNORMAL HIGH (ref 70–99)

## 2020-05-24 LAB — PHOSPHORUS: Phosphorus: 4 mg/dL (ref 2.5–4.6)

## 2020-05-24 LAB — C-REACTIVE PROTEIN: CRP: 0.9 mg/dL (ref <1.0)

## 2020-05-24 LAB — FERRITIN: Ferritin: 488 ng/mL — ABNORMAL HIGH (ref 11–307)

## 2020-05-24 LAB — LACTATE DEHYDROGENASE: LDH: 161 U/L (ref 98–192)

## 2020-05-24 LAB — MAGNESIUM: Magnesium: 2.6 mg/dL — ABNORMAL HIGH (ref 1.7–2.4)

## 2020-05-24 NOTE — Plan of Care (Addendum)
Pt Axox4. Calm and cooperative and able to voice her needs. Delayed responses noted at times but pt answers questions appropriately overall. Pt remains on 2L [place]. No c/o of pain at the jaw area but mouth/lip swollen. Pt able to take pills whole and cautiously drinks water. IVF infusing. On tele. Pt able to use bedside commode without assist. Safety measures in place. Will continue to monitor.                                                                                                                                                                                     Problem: Education: Goal: Knowledge of General Education information will improve Description: Including pain rating scale, medication(s)/side effects and non-pharmacologic comfort measures Outcome: Progressing   Problem: Health Behavior/Discharge Planning: Goal: Ability to manage health-related needs will improve Outcome: Progressing   Problem: Clinical Measurements: Goal: Ability to maintain clinical measurements within normal limits will improve Outcome: Progressing Goal: Will remain free from infection Outcome: Progressing Goal: Diagnostic test results will improve Outcome: Progressing Goal: Respiratory complications will improve Outcome: Progressing Goal: Cardiovascular complication will be avoided Outcome: Progressing   Problem: Activity: Goal: Risk for activity intolerance will decrease Outcome: Progressing   Problem: Nutrition: Goal: Adequate nutrition will be maintained Outcome: Progressing   Problem: Coping: Goal: Level of anxiety will decrease Outcome: Progressing   Problem: Elimination: Goal: Will not experience complications related to bowel motility Outcome: Progressing Goal: Will not experience complications related to urinary retention Outcome: Progressing   Problem: Pain Managment: Goal: General experience of comfort will improve Outcome: Progressing   Problem: Safety: Goal: Ability to  remain free from injury will improve Outcome: Progressing   Problem: Skin Integrity: Goal: Risk for impaired skin integrity will decrease Outcome: Progressing   Problem: Skin Integrity: Goal: Risk for impaired skin integrity will decrease Outcome: Progressing

## 2020-05-24 NOTE — Progress Notes (Signed)
PROGRESS NOTE    DEETTA SIEGMANN  EHM:[id number] DOB: 1955-07-17 DOA: 05/17/2020 PCP: Pcp, No     Brief Narrative:  65 y.o.BF PMHx Hysterectomy.She reports that she has felt ill for about 3 weeks. Initially she had diarrhea which did clear. However, she has had progressive weakness to the point where it is difficult to manage ADLs and she has had progressive SOB with a non-productive cough. Due to her symptoms she presents to [facility] for evaluation. Upon arriving the hospital, she was found to be Covid positive. Chest x-ray showed patchyinfiltrates. She was started on IV steroids, remdesivir. 1/2.MRI showed acute stroke.  Giving IV Lasix for exacerbation of congestive heart failure.   Subjective: 1/8 A/O x4.  Patient states jaw pain significantly reduced, currently eating soup.    Assessment & Plan: Covid vaccination; unvaccinated   Active Problems:   Pneumonia due to COVID-19 virus   Dehydration   Acute ischemic left ACA stroke (HCC)   Acute hypoxemic respiratory failure (HCC)  Acute respiratory failure with hypoxia/Covid 19 pneumonia COVID-19 Labs  Recent Labs    05/22/20 0500 05/23/20 0333 05/23/20 1001 05/24/20 0556  DDIMER 0.68* 0.64*  --   --   FERRITIN 467* 458*  --  488*  LDH 167  --  157 161  CRP 1.5* 1.0*  --  0.9    Lab Results  Component Value Date   SARSCOV2NAA POSITIVE (A) 05/17/2020  -Multilobe pneumonia secondary to COVID-19. -Remdesivir per pharmacy protocol -Decadron 6 mg daily -Vitamins per Covid protocol  Acute on chronic CHF  -Ruled in  -Strict in and out -2.3 L -Daily weight Filed Weights   05/17/20 2141  Weight: 65.8 kg  -Amlodipine 10 mg daily - Hydralazine p.o. 5 mg TID -Hydralazine PRN - 1/7 normal saline 50 ml/hr  Essential HTN -See CHF  Acute ischemic stroke Right ICA aneurysm. Patient is seen by neurology.  Currently on aspirin and Plavix. Patient will need follow-up on ICA aneurysm as outpatient. Patient will  also need follow-up with cardiology as outpatient for loop recorder. Patient currently still has significant weakness, physical therapy/Occupational Therapy recommended -awaiting SNF placement.  Acute kidney injury Secondary to Covid. Renal function has improved. Lab Results  Component Value Date   CREATININE 1.08 (H) 05/24/2020   CREATININE 1.13 (H) 05/23/2020   CREATININE 1.26 (H) 05/22/2020   CREATININE 1.06 (H) 05/21/2020   CREATININE 1.10 (H) 05/20/2020   Anterior dislocation of the mandibular condyles -1/7 consulted ENT Dr Elenore Rota who stated he will see patient later this evening after he completes office hours. -1/7 Closed reduction of mandibular dislocation bilaterally,  -1/8 patient feeling significantly better.  Goals of care - PT/OT; recommend SNF     DVT prophylaxis: Lovenox Code Status:  Family Communication: 1/7 spoke with [name] patient's POC discussed plan of care answered all questions status is: Inpatient    Dispo: The patient is from: Home  Anticipated d/c is to: SNF  Anticipated d/c date is: 3 days  Patient currently is not medically stable to d/c.     Consultants:  Neurology ENT Dr Elenore Rota     Procedures/Significant Events:  1/3 Echocardiogram;LVEF= 55 to 60%.  -Grade I diastolic dysfunction (impaired relaxation).  1/7 Closed reduction of mandibular dislocation bilaterally  I have personally reviewed and interpreted all radiology studies and my findings are as above.  VENTILATOR SETTINGS: Room air 1/5 SPO2 95%   Cultures   Antimicrobials: Anti-infectives (From admission, onward)   Start     Ordered  Stop   05/22/20 1000  remdesivir 100 mg in sodium chloride 0.9 % 100 mL IVPB        05/21/20 1347     05/20/20 1600  remdesivir 100 mg in sodium chloride 0.9 % 100 mL IVPB  Status:  Discontinued        05/20/20 1442 05/21/20 1346   05/18/20 1000  remdesivir 100 mg in sodium chloride 0.9  % 100 mL IVPB       "Followed by" Linked Group Details   05/18/20 0014 05/22/20 0959   05/18/20 0015  remdesivir 200 mg in sodium chloride 0.9% 250 mL IVPB       "Followed by" Linked Group Details   05/18/20 0014 05/18/20 0232   05/17/20 2300  cefTRIAXone (ROCEPHIN) 1 g in sodium chloride 0.9 % 100 mL IVPB        05/17/20 2249 05/18/20 0022   05/17/20 2300  azithromycin (ZITHROMAX) 500 mg in sodium chloride 0.9 % 250 mL IVPB        05/17/20 2249 05/18/20 0235       Devices    LINES / TUBES:      Continuous Infusions: . sodium chloride       Objective: Vitals:   05/24/20 0424 05/24/20 0834 05/24/20 1009 05/24/20 1201  BP: (!) 158/79 (!) 180/103 (!) 166/91 (!) 159/86  Pulse: (!) 57 63 (!) 49 (!) 54  Resp: 18 16 20 16   Temp: (!) 97.5 F (36.4 C) 97.9 F (36.6 C) 97.8 F (36.6 C) (!) 97.3 F (36.3 C)  TempSrc: Oral Oral Oral Oral  SpO2: 94% 100% 100% 98%  Weight:      Height:        Intake/Output Summary (Last 24 hours) at 05/24/2020 1612 Last data filed at 05/24/2020 1400 Gross per 24 hour  Intake 720 ml  Output 1700 ml  Net -980 ml   Filed Weights   05/17/20 2141  Weight: 65.8 kg    Examination:  General: A/O x4, No acute respiratory distress Eyes: negative scleral hemorrhage, negative anisocoria, negative icterus ENT: Negative Runny nose, negative gingival bleeding,  Neck:  Negative scars, masses, torticollis, lymphadenopathy, JVD Lungs: Clear to auscultation bilaterally without wheezes or crackles Cardiovascular: Regular rate and rhythm without murmur gallop or rub normal S1 and S2 Abdomen: negative abdominal pain, nondistended, positive soft, bowel sounds, no rebound, no ascites, no appreciable mass Extremities: No significant cyanosis, clubbing, or edema bilateral lower extremities Skin: Negative rashes, lesions, ulcers Psychiatric:  Negative depression, negative anxiety, negative fatigue, negative mania  Central nervous system:  Cranial nerves II  through XII intact, tongue/uvula midline, all extremities muscle strength 5/5, sensation intact throughout, finger nose finger bilateral past-pointing, quick finger touch bilateral difficult to accomplish, positive dysarthria, negative expressive aphasia, negative receptive aphasia.  .     Data Reviewed: Care during the described time interval was provided by me .  I have reviewed this patient's available data, including medical history, events of note, physical examination, and all test results as part of my evaluation.  CBC: Recent Labs  Lab 05/20/20 0412 05/21/20 0351 05/22/20 0500 05/23/20 1001 05/24/20 0556  WBC 11.5* 9.7 9.0 7.7 6.9  NEUTROABS 9.5* 7.8* 6.7 4.9 4.8  HGB 14.3 12.7 12.5 12.8 13.2  HCT 42.6 38.3 37.5 39.0 39.5  MCV 84.0 83.6 84.5 85.0 85.3  PLT 286 262 276 303 337   Basic Metabolic Panel: Recent Labs  Lab 05/19/20 0515 05/20/20 0412 05/21/20 0351 05/22/20 0500 05/23/20 0333 05/23/20  1001 05/24/20 0556  NA 136 138 139 140 139  --  138  K 3.9 3.5 3.6 3.8 4.0  --  4.2  CL 107 100 104 105 104  --  103  CO2 23 26 25 28 26   --  26  GLUCOSE 141* 141* 121* 105* 94  --  100*  BUN 28* 27* 33* 41* 38*  --  36*  CREATININE 0.97 1.10* 1.06* 1.26* 1.13*  --  1.08*  CALCIUM 9.9 10.6* 10.4* 10.2 9.8  --  10.4*  MG 1.8 1.7  --  2.4  --  2.6* 2.6*  PHOS  --   --   --  3.2  --  3.5 4.0   GFR: Estimated Creatinine Clearance: 55.4 mL/min (A) (by C-G formula based on SCr of 1.08 mg/dL (H)). Liver Function Tests: Recent Labs  Lab 05/20/20 0412 05/21/20 0351 05/22/20 0500 05/23/20 0333 05/24/20 0556  AST 16 14* 13* 14* 15  ALT 15 12 11 12 13   ALKPHOS 64 55 56 51 59  BILITOT 0.7 0.8 0.8 0.7 0.9  PROT 7.3 6.5 6.4* 6.2* 6.4*  ALBUMIN 3.3* 3.0* 3.0* 2.9* 3.2*   No results for input(s): LIPASE, AMYLASE in the last 168 hours. Recent Labs  Lab 05/17/20 2231  AMMONIA 11   Coagulation Profile: Recent Labs  Lab 05/17/20 2206  INR 1.0   Cardiac  Enzymes: Recent Labs  Lab 05/17/20 2206  CKTOTAL 38   BNP (last 3 results) No results for input(s): PROBNP in the last 8760 hours. HbA1C: No results for input(s): HGBA1C in the last 72 hours. CBG: Recent Labs  Lab 05/23/20 1155 05/23/20 1721 05/23/20 2121 05/24/20 0834 05/24/20 1203  GLUCAP 99 123* 133* 92 121*   Lipid Profile: No results for input(s): CHOL, HDL, LDLCALC, TRIG, CHOLHDL, LDLDIRECT in the last 72 hours. Thyroid Function Tests: No results for input(s): TSH, T4TOTAL, FREET4, T3FREE, THYROIDAB in the last 72 hours. Anemia Panel: Recent Labs    05/23/20 0333 05/24/20 0556  FERRITIN 458* 488*   Sepsis Labs: Recent Labs  Lab 05/17/20 2206 05/19/20 0515 05/20/20 0412  PROCALCITON 0.19 <0.10 0.16    Recent Results (from the past 240 hour(s))  Resp Panel by RT-PCR (Flu A&B, Covid) Nasopharyngeal Swab     Status: Abnormal   Collection Time: 05/17/20 10:31 PM   Specimen: Nasopharyngeal Swab; Nasopharyngeal(NP) swabs in vial transport medium  Result Value Ref Range Status   SARS Coronavirus 2 by RT PCR POSITIVE (A) NEGATIVE Final    Comment: RESULT CALLED TO, READ BACK BY AND VERIFIED WITH: jenna elington @2350  on 05/17/20 skl (NOTE) SARS-CoV-2 target nucleic acids are DETECTED.  The SARS-CoV-2 RNA is generally detectable in upper respiratory specimens during the acute phase of infection. Positive results are indicative of the presence of the identified virus, but do not rule out bacterial infection or co-infection with other pathogens not detected by the test. Clinical correlation with patient history and other diagnostic information is necessary to determine patient infection status. The expected result is Negative.  Fact Sheet for Patients: BloggerCourse.comhttps://www.fda.gov/media/152166/download  Fact Sheet for Healthcare Providers: SeriousBroker.ithttps://www.fda.gov/media/152162/download  This test is not yet approved or cleared by the [place] FDA and  has been  authorized for detection and/or diagnosis of SARS-CoV-2 by FDA under an Emergency Use Authorization (EUA).  This EUA will remain in effect (meaning this test can  be used) for the duration of  the COVID-19 declaration under Section 564(b)(1) of the Act, 21 U.S.C. section 360bbb-3(b)(1),  unless the authorization is terminated or revoked sooner.     Influenza A by PCR NEGATIVE NEGATIVE Final   Influenza B by PCR NEGATIVE NEGATIVE Final    Comment: (NOTE) The Xpert Xpress SARS-CoV-2/FLU/RSV plus assay is intended as an aid in the diagnosis of influenza from Nasopharyngeal swab specimens and should not be used as a sole basis for treatment. Nasal washings and aspirates are unacceptable for Xpert Xpress SARS-CoV-2/FLU/RSV testing.  Fact Sheet for Patients: BloggerCourse.comhttps://www.fda.gov/media/152166/download  Fact Sheet for Healthcare Providers: SeriousBroker.ithttps://www.fda.gov/media/152162/download  This test is not yet approved or cleared by the [place] FDA and has been authorized for detection and/or diagnosis of SARS-CoV-2 by FDA under an Emergency Use Authorization (EUA). This EUA will remain in effect (meaning this test can be used) for the duration of the COVID-19 declaration under Section 564(b)(1) of the Act, 21 U.S.C. section 360bbb-3(b)(1), unless the authorization is terminated or revoked.  Performed at [facility], [address] [place], [place] [address]   Blood culture (routine x 2)     Status: None   Collection Time: 05/17/20 10:50 PM   Specimen: BLOOD  Result Value Ref Range Status   Specimen Description BLOOD BLOOD RIGHT FOREARM  Final   Special Requests   Final    BOTTLES DRAWN AEROBIC AND ANAEROBIC Blood Culture results may not be optimal due to an excessive volume of blood received in culture bottles   Culture   Final    NO GROWTH 5 DAYS Performed at [facility], [address] [place], [place] [address]    Report Status 05/22/2020 FINAL  Final   Blood culture (routine x 2)     Status: None   Collection Time: 05/17/20 10:55 PM   Specimen: BLOOD  Result Value Ref Range Status   Specimen Description BLOOD BLOOD LEFT FOREARM  Final   Special Requests   Final    BOTTLES DRAWN AEROBIC AND ANAEROBIC Blood Culture adequate volume   Culture   Final    NO GROWTH 5 DAYS Performed at [facility], [address] [place], [place] [address]    Report Status 05/22/2020 FINAL  Final         Radiology Studies: CT MAXILLOFACIAL WO CONTRAST  Result Date: 05/23/2020 CLINICAL DATA:  TMJ dislocations. EXAM: CT MAXILLOFACIAL WITHOUT CONTRAST TECHNIQUE: Multidetector CT imaging of the maxillofacial structures was performed. Multiplanar CT image reconstructions were also generated. COMPARISON:  None. FINDINGS: The mandibular condyles are dislocated anteriorly. They are anterior to the condylar eminence is. No fracture is identified. Fairly extensive dental disease is noted with dental caries and areas of periapical lucency. The facial bones are intact. No facial bone fractures are identified. The bony orbits are intact. The globes are normal. Normal intraorbital fat. The paranasal sinuses and mastoid air cells are clear. No significant intracranial abnormalities are identified. Vascular calcifications are noted. 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 No significant subcutaneous soft tissue abnormalities. IMPRESSION: 1. Anterior dislocation of the mandibular condyles. 2. No facial bone fractures are identified. 3. Fairly extensive dental disease. 4. 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 Electronically Signed   By: Rudie MeyerP.  Gallerani M.D.   On: 05/23/2020 12:28        Scheduled Meds: . amLODipine  10 mg Oral Daily  . vitamin C  500 mg Oral Daily  . aspirin EC  81 mg Oral Daily  . clopidogrel  75 mg Oral Daily  . dexamethasone  6 mg  Oral Daily  . hydrALAZINE  5 mg Oral Q8H  . influenza  vac split quadrivalent PF  0.5 mL Intramuscular Tomorrow-1000  . insulin aspart  0-15 Units Subcutaneous TID WC  . insulin detemir  0.075 Units/kg Subcutaneous BID  . Ipratropium-Albuterol  1 puff Inhalation Q6H  . senna  1 tablet Oral BID  . zinc sulfate  220 mg Oral Daily   Continuous Infusions: . sodium chloride       LOS: 6 days    Time spent:40 min    Ciearra Rufo, Roselind Messier, MD [facility] Pager [phone]  If 7PM-7AM, please contact night-coverage [email or web address] Password [facility] 05/24/2020, 4:12 PM

## 2020-05-24 NOTE — Progress Notes (Signed)
Occupational Therapy Treatment Patient Details Name: Stephanie [name] MRN: [id number] DOB: [birth year] Today's Date: 05/24/2020    History of present illness 65 y.o. female with no significant medical history except for hysterectomy. She reports that she has felt ill for about 3 weeks. Initially she had diarrhea which did clear. However, she has had progressive weakness to the point where it is difficult to manage ADLs and she has had progressive SOB with a non-productive cough. Due to her symptoms she presents to [facility] for evaluation.   OT comments  Stephanie [name] was seen for OT treatment on this date. Upon arrival to room pt reclined in bed and agreeable to tx. Pt instructed in HEP and completed seated exercises as described below. Requiring MIN multimodal cueing t/o to complete exercises. Pt fatigues quickly and requires rest breaks t/o. Pt making good progress toward goals. Pt continues to benefit from skilled OT services to maximize return to PLOF and minimize risk of future falls, injury, caregiver burden, and readmission. Will continue to follow POC. Discharge recommendation remains appropriate.    Follow Up Recommendations  SNF;Supervision/Assistance - 24 hour    Equipment Recommendations  Other (comment);3 in 1 bedside commode (RW)    Recommendations for Other Services      Precautions / Restrictions Precautions Precautions: Fall Restrictions Weight Bearing Restrictions: No       Mobility Bed Mobility Overal bed mobility: Needs Assistance Bed Mobility: Supine to Sit;Sit to Supine     Supine to sit: Min guard Sit to supine: Min assist           Balance Overall balance assessment: Needs assistance Sitting-balance support: Feet supported Sitting balance-Leahy Scale: Good                                     ADL either performed or assessed with clinical judgement   ADL Overall ADL's : Needs assistance/impaired                                        General ADL Comments: MIN A for UBD seated EOB. Opens tooth paste c increased time seated EOB               Cognition Arousal/Alertness: Awake/alert Behavior During Therapy: Flat affect Overall Cognitive Status: Impaired/Different from baseline                                 General Comments: MIN multimodal cueing t/o        Exercises Exercises: General Upper Extremity;General Lower Extremity;Other exercises General Exercises - Upper Extremity Shoulder Flexion: AROM;Strengthening;Both;10 reps;Seated Shoulder Extension: AROM;Strengthening;Both;10 reps;Seated Shoulder ABduction: AROM;Strengthening;Both;10 reps;Seated Shoulder ADduction: AROM;Strengthening;Both;10 reps;Seated Elbow Flexion: AROM;Strengthening;Both;10 reps;Seated Elbow Extension: AROM;Strengthening;Both;10 reps;Seated General Exercises - Lower Extremity Long Arc Quad: AROM;Strengthening;Both;10 reps;Seated Hip Flexion/Marching: AROM;Strengthening;Both;10 reps;Seated Other Exercises Other Exercises: Pt educated re: DME recs, d/c recs, falls prevention, HEP Other Exercises: UBD, grooming setup           Pertinent Vitals/ Pain       Pain Assessment: No/denies pain         Frequency  Min 2X/week        Progress Toward Goals  OT Goals(current goals can now be found in the care plan section)  Progress towards OT  goals: Progressing toward goals  Acute Rehab OT Goals Patient Stated Goal: return to PLOF working at [employer] OT Goal Formulation: With patient Time For Goal Achievement: 06/03/20 Potential to Achieve Goals: Good ADL Goals Pt Will Perform Grooming: with modified independence;standing Pt Will Transfer to Toilet: ambulating;with supervision Pt Will Perform Toileting - Clothing Manipulation and hygiene: with supervision;sit to/from stand  Plan Discharge plan remains appropriate;Frequency remains appropriate       AM-PAC OT "6 Clicks" Daily  Activity     Outcome Measure   Help from another person eating meals?: A Little Help from another person taking care of personal grooming?: A Little Help from another person toileting, which includes using toliet, bedpan, or urinal?: A Lot Help from another person bathing (including washing, rinsing, drying)?: A Lot Help from another person to put on and taking off regular upper body clothing?: A Little Help from another person to put on and taking off regular lower body clothing?: A Lot 6 Click Score: 15    End of Session Equipment Utilized During Treatment: Oxygen  OT Visit Diagnosis: Unsteadiness on feet (R26.81);Repeated falls (R29.6);Muscle weakness (generalized) (M62.81)   Activity Tolerance Patient tolerated treatment well   Patient Left in bed;with call bell/phone within reach;with bed alarm set   Nurse Communication          Time: 4166-0630 OT Time Calculation (min): 12 min  Charges: OT General Charges $OT Visit: 1 Visit OT Treatments $Therapeutic Exercise: 8-22 mins  Kathie Dike, M.S. OTR/L  05/24/20, 3:35 PM  ascom [phone]

## 2020-05-25 LAB — CBC WITH DIFFERENTIAL/PLATELET
Abs Immature Granulocytes: 0.4 10*3/uL — ABNORMAL HIGH (ref 0.00–0.07)
Basophils Absolute: 0 10*3/uL (ref 0.0–0.1)
Basophils Relative: 0 %
Eosinophils Absolute: 0 10*3/uL (ref 0.0–0.5)
Eosinophils Relative: 0 %
HCT: 36.5 % (ref 36.0–46.0)
Hemoglobin: 12.4 g/dL (ref 12.0–15.0)
Immature Granulocytes: 6 %
Lymphocytes Relative: 13 %
Lymphs Abs: 0.9 10*3/uL (ref 0.7–4.0)
MCH: 29 pg (ref 26.0–34.0)
MCHC: 34 g/dL (ref 30.0–36.0)
MCV: 85.5 fL (ref 80.0–100.0)
Monocytes Absolute: 1.2 10*3/uL — ABNORMAL HIGH (ref 0.1–1.0)
Monocytes Relative: 16 %
Neutro Abs: 4.8 10*3/uL (ref 1.7–7.7)
Neutrophils Relative %: 65 %
Platelets: 293 10*3/uL (ref 150–400)
RBC: 4.27 MIL/uL (ref 3.87–5.11)
RDW: 13.2 % (ref 11.5–15.5)
Smear Review: NORMAL
WBC: 7.3 10*3/uL (ref 4.0–10.5)
nRBC: 0 % (ref 0.0–0.2)

## 2020-05-25 LAB — COMPREHENSIVE METABOLIC PANEL
ALT: 14 U/L (ref 0–44)
AST: 13 U/L — ABNORMAL LOW (ref 15–41)
Albumin: 2.9 g/dL — ABNORMAL LOW (ref 3.5–5.0)
Alkaline Phosphatase: 48 U/L (ref 38–126)
Anion gap: 5 (ref 5–15)
BUN: 33 mg/dL — ABNORMAL HIGH (ref 8–23)
CO2: 26 mmol/L (ref 22–32)
Calcium: 10.1 mg/dL (ref 8.9–10.3)
Chloride: 108 mmol/L (ref 98–111)
Creatinine, Ser: 1.01 mg/dL — ABNORMAL HIGH (ref 0.44–1.00)
GFR, Estimated: >60 mL/min (ref >60)
Glucose, Bld: 99 mg/dL (ref 70–99)
Potassium: 4 mmol/L (ref 3.5–5.1)
Sodium: 139 mmol/L (ref 135–145)
Total Bilirubin: 0.8 mg/dL (ref 0.3–1.2)
Total Protein: 5.8 g/dL — ABNORMAL LOW (ref 6.5–8.1)

## 2020-05-25 LAB — GLUCOSE, CAPILLARY
Glucose-Capillary: 92 mg/dL (ref 70–99)
Glucose-Capillary: 122 mg/dL — ABNORMAL HIGH (ref 70–99)
Glucose-Capillary: 140 mg/dL — ABNORMAL HIGH (ref 70–99)
Glucose-Capillary: 118 mg/dL — ABNORMAL HIGH (ref 70–99)
Glucose-Capillary: 129 mg/dL — ABNORMAL HIGH (ref 70–99)

## 2020-05-25 LAB — MAGNESIUM: Magnesium: 2.3 mg/dL (ref 1.7–2.4)

## 2020-05-25 LAB — FERRITIN: Ferritin: 422 ng/mL — ABNORMAL HIGH (ref 11–307)

## 2020-05-25 LAB — PHOSPHORUS: Phosphorus: 2.8 mg/dL (ref 2.5–4.6)

## 2020-05-25 LAB — LACTATE DEHYDROGENASE: LDH: 137 U/L (ref 98–192)

## 2020-05-25 LAB — D-DIMER, QUANTITATIVE: D-Dimer, Quant: 0.69 ug/mL-FEU — ABNORMAL HIGH (ref 0.00–0.50)

## 2020-05-25 LAB — C-REACTIVE PROTEIN: CRP: 0.6 mg/dL (ref <1.0)

## 2020-05-25 MED ORDER — HYDRALAZINE HCL 10 MG PO TABS
15.0000 mg | ORAL_TABLET | Freq: Three times a day (TID) | ORAL | Status: DC
  Administered 2020-05-25 – 2020-05-28 (×9): 15 mg via ORAL
  Filled 2020-05-25 (×10): qty 2

## 2020-05-25 NOTE — TOC Progression Note (Signed)
Transition of Care [facility]) - Progression Note    Patient Details  Name: Stephanie [name] MRN: [id number] Date of Birth: Jan 02, 1956  Transition of Care [facility]) CM/SW Contact  Bing Quarry, RN Phone Number: 05/25/2020, 4:45 PM  Clinical Narrative:   No bed offers today noted in chart. Gabriel Cirri RN CM    Expected Discharge Plan: Home w Home Health Services Barriers to Discharge: Continued Medical Work up  Expected Discharge Plan and Services Expected Discharge Plan: Home w Home Health Services     Post Acute Care Choice: Home Health Living arrangements for the past 2 months: Apartment                                       Social Determinants of Health (SDOH) Interventions    Readmission Risk Interventions No flowsheet data found.

## 2020-05-25 NOTE — Progress Notes (Signed)
PROGRESS NOTE    Daryll DrownJewel C Bassett  ZOX:[id number] DOB: 04-16-1956 DOA: 05/17/2020 PCP: Pcp, No     Brief Narrative:  65 y.o.BF PMHx Hysterectomy.She reports that she has felt ill for about 3 weeks. Initially she had diarrhea which did clear. However, she has had progressive weakness to the point where it is difficult to manage ADLs and she has had progressive SOB with a non-productive cough. Due to her symptoms she presents to [facility] for evaluation. Upon arriving the hospital, she was found to be Covid positive. Chest x-ray showed patchyinfiltrates. She was started on IV steroids, remdesivir. 1/2.MRI showed acute stroke.  Giving IV Lasix for exacerbation of congestive heart failure.   Subjective: 1/9 afebrile overnight A/O x4, states jaw pain has essentially resolved.  Able to freely open and close her jaw.     Assessment & Plan: Covid vaccination; unvaccinated   Active Problems:   Pneumonia due to COVID-19 virus   Dehydration   Acute ischemic left ACA stroke (HCC)   Acute hypoxemic respiratory failure (HCC)  Acute respiratory failure with hypoxia/Covid 19 pneumonia COVID-19 Labs  Recent Labs    05/23/20 0333 05/23/20 1001 05/24/20 0556 05/25/20 0537  DDIMER 0.64*  --  0.69* 0.69*  FERRITIN 458*  --  488* 422*  LDH  --  157 161 137  CRP 1.0*  --  0.9 0.6    Lab Results  Component Value Date   SARSCOV2NAA POSITIVE (A) 05/17/2020  -Multilobe pneumonia secondary to COVID-19. -Remdesivir per pharmacy protocol -Decadron 6 mg daily -Vitamins per Covid protocol  Acute on chronic CHF  -Ruled in  -Strict in and out -2.7 L -Daily weight Filed Weights   05/17/20 2141  Weight: 65.8 kg  -Amlodipine 10 mg daily - 1/9 increase Hydralazine 50 mg TID -Hydralazine PRN - 1/7 normal saline 50 ml/hr  Essential HTN -See CHF  Acute ischemic stroke -Right ICA aneurysm. -Patient is seen by neurology.  Currently on aspirin and Plavix. -Patient will need follow-up on  ICA aneurysm as outpatient. -Patient will also need follow-up with cardiology as outpatient for loop recorder. -PT/OT recommended SNF -awaiting SNF placement.  Acute kidney injury Secondary to Covid. Renal function has improved. Lab Results  Component Value Date   CREATININE 1.01 (H) 05/25/2020   CREATININE 1.08 (H) 05/24/2020   CREATININE 1.13 (H) 05/23/2020   CREATININE 1.26 (H) 05/22/2020   CREATININE 1.06 (H) 05/21/2020  -Resolving with gentle hydration  Anterior dislocation of the mandibular condyles -1/7 consulted ENT Dr Elenore RotaJuengel who stated he will see patient later this evening after he completes office hours. -1/7 Closed reduction of mandibular dislocation bilaterally,  -1/8 patient feeling significantly better.  Goals of care - PT/OT; recommend SNF     DVT prophylaxis: Lovenox Code Status:  Family Communication: 1/7 spoke with Merceda ElksKatrina Griffith patient's POC discussed plan of care answered all questions status is: Inpatient    Dispo: The patient is from: Home  Anticipated d/c is to: SNF  Anticipated d/c date is: 3 days  Patient currently is not medically stable to d/c.     Consultants:  Neurology ENT Dr Elenore RotaJuengel     Procedures/Significant Events:  1/3 Echocardiogram;LVEF= 55 to 60%.  -Grade I diastolic dysfunction (impaired relaxation).  1/7 Closed reduction of mandibular dislocation bilaterally  I have personally reviewed and interpreted all radiology studies and my findings are as above.  VENTILATOR SETTINGS: Nasal cannula 1/9 Flow 2 L/min SPO2 98%   Cultures   Antimicrobials: Anti-infectives (From admission, onward)  Start     Ordered Stop   05/22/20 1000  remdesivir 100 mg in sodium chloride 0.9 % 100 mL IVPB        05/21/20 1347     05/20/20 1600  remdesivir 100 mg in sodium chloride 0.9 % 100 mL IVPB  Status:  Discontinued        05/20/20 1442 05/21/20 1346   05/18/20 1000  remdesivir 100 mg in  sodium chloride 0.9 % 100 mL IVPB       "Followed by" Linked Group Details   05/18/20 0014 05/22/20 0959   05/18/20 0015  remdesivir 200 mg in sodium chloride 0.9% 250 mL IVPB       "Followed by" Linked Group Details   05/18/20 0014 05/18/20 0232   05/17/20 2300  cefTRIAXone (ROCEPHIN) 1 g in sodium chloride 0.9 % 100 mL IVPB        05/17/20 2249 05/18/20 0022   05/17/20 2300  azithromycin (ZITHROMAX) 500 mg in sodium chloride 0.9 % 250 mL IVPB        05/17/20 2249 05/18/20 0235       Devices    LINES / TUBES:      Continuous Infusions: . sodium chloride 50 mL/hr at 05/25/20 0103     Objective: Vitals:   05/24/20 2010 05/24/20 2322 05/25/20 0319 05/25/20 0836  BP: (!) 151/77 (!) 163/77 (!) 152/97 (!) 159/89  Pulse: (!) 56 (!) 54 (!) 55 (!) 56  Resp: 18 16 18 17   Temp: 98 F (36.7 C) 98.2 F (36.8 C) 97.8 F (36.6 C) 97.9 F (36.6 C)  TempSrc: Oral Oral Oral Oral  SpO2: 97% 100% 96% 98%  Weight:      Height:        Intake/Output Summary (Last 24 hours) at 05/25/2020 0941 Last data filed at 05/25/2020 0103 Gross per 24 hour  Intake 481.13 ml  Output 850 ml  Net -368.87 ml   Filed Weights   05/17/20 2141  Weight: 65.8 kg    Examination:  General: A/O x4, No acute respiratory distress Eyes: negative scleral hemorrhage, negative anisocoria, negative icterus ENT: Negative Runny nose, negative gingival bleeding,  Neck:  Negative scars, masses, torticollis, lymphadenopathy, JVD Lungs: Clear to auscultation bilaterally without wheezes or crackles Cardiovascular: Regular rate and rhythm without murmur gallop or rub normal S1 and S2 Abdomen: negative abdominal pain, nondistended, positive soft, bowel sounds, no rebound, no ascites, no appreciable mass Extremities: No significant cyanosis, clubbing, or edema bilateral lower extremities Skin: Negative rashes, lesions, ulcers Psychiatric:  Negative depression, negative anxiety, negative fatigue, negative mania   Central nervous system:  Cranial nerves II through XII intact, tongue/uvula midline, all extremities muscle strength 5/5, sensation intact throughout, finger nose finger bilateral past-pointing, quick finger touch bilateral difficult to accomplish, positive dysarthria, negative expressive aphasia, negative receptive aphasia.  .     Data Reviewed: Care during the described time interval was provided by me .  I have reviewed this patient's available data, including medical history, events of note, physical examination, and all test results as part of my evaluation.  CBC: Recent Labs  Lab 05/21/20 0351 05/22/20 0500 05/23/20 1001 05/24/20 0556 05/25/20 0537  WBC 9.7 9.0 7.7 6.9 7.3  NEUTROABS 7.8* 6.7 4.9 4.8 4.8  HGB 12.7 12.5 12.8 13.2 12.4  HCT 38.3 37.5 39.0 39.5 36.5  MCV 83.6 84.5 85.0 85.3 85.5  PLT 262 276 303 337 293   Basic Metabolic Panel: Recent Labs  Lab 05/20/20 0412 05/21/20  6387 05/22/20 0500 05/23/20 0333 05/23/20 1001 05/24/20 0556 05/25/20 0537  NA 138 139 140 139  --  138 139  K 3.5 3.6 3.8 4.0  --  4.2 4.0  CL 100 104 105 104  --  103 108  CO2 26 25 28 26   --  26 26  GLUCOSE 141* 121* 105* 94  --  100* 99  BUN 27* 33* 41* 38*  --  36* 33*  CREATININE 1.10* 1.06* 1.26* 1.13*  --  1.08* 1.01*  CALCIUM 10.6* 10.4* 10.2 9.8  --  10.4* 10.1  MG 1.7  --  2.4  --  2.6* 2.6* 2.3  PHOS  --   --  3.2  --  3.5 4.0 2.8   GFR: Estimated Creatinine Clearance: 59.2 mL/min (A) (by C-G formula based on SCr of 1.01 mg/dL (H)). Liver Function Tests: Recent Labs  Lab 05/21/20 0351 05/22/20 0500 05/23/20 0333 05/24/20 0556 05/25/20 0537  AST 14* 13* 14* 15 13*  ALT 12 11 12 13 14   ALKPHOS 55 56 51 59 48  BILITOT 0.8 0.8 0.7 0.9 0.8  PROT 6.5 6.4* 6.2* 6.4* 5.8*  ALBUMIN 3.0* 3.0* 2.9* 3.2* 2.9*   No results for input(s): LIPASE, AMYLASE in the last 168 hours. No results for input(s): AMMONIA in the last 168 hours. Coagulation Profile: No results for  input(s): INR, PROTIME in the last 168 hours. Cardiac Enzymes: No results for input(s): CKTOTAL, CKMB, CKMBINDEX, TROPONINI in the last 168 hours. BNP (last 3 results) No results for input(s): PROBNP in the last 8760 hours. HbA1C: No results for input(s): HGBA1C in the last 72 hours. CBG: Recent Labs  Lab 05/24/20 0834 05/24/20 1203 05/24/20 1636 05/24/20 2017 05/25/20 0837  GLUCAP 92 121* 120* 137* 92   Lipid Profile: No results for input(s): CHOL, HDL, LDLCALC, TRIG, CHOLHDL, LDLDIRECT in the last 72 hours. Thyroid Function Tests: No results for input(s): TSH, T4TOTAL, FREET4, T3FREE, THYROIDAB in the last 72 hours. Anemia Panel: Recent Labs    05/24/20 0556 05/25/20 0537  FERRITIN 488* 422*   Sepsis Labs: Recent Labs  Lab 05/19/20 0515 05/20/20 0412  PROCALCITON <0.10 0.16    Recent Results (from the past 240 hour(s))  Resp Panel by RT-PCR (Flu A&B, Covid) Nasopharyngeal Swab     Status: Abnormal   Collection Time: 05/17/20 10:31 PM   Specimen: Nasopharyngeal Swab; Nasopharyngeal(NP) swabs in vial transport medium  Result Value Ref Range Status   SARS Coronavirus 2 by RT PCR POSITIVE (A) NEGATIVE Final    Comment: RESULT CALLED TO, READ BACK BY AND VERIFIED WITH: jenna elington @2350  on 05/17/20 skl (NOTE) SARS-CoV-2 target nucleic acids are DETECTED.  The SARS-CoV-2 RNA is generally detectable in upper respiratory specimens during the acute phase of infection. Positive results are indicative of the presence of the identified virus, but do not rule out bacterial infection or co-infection with other pathogens not detected by the test. Clinical correlation with patient history and other diagnostic information is necessary to determine patient infection status. The expected result is Negative.  Fact Sheet for Patients: 07/15/20  Fact Sheet for Healthcare Providers:  This test is not  yet approved or cleared by the 07/15/20 FDA and  has been authorized for detection and/or diagnosis of SARS-CoV-2 by FDA under an Emergency Use Authorization (EUA).  This EUA will remain in effect (meaning this test can  be used) for the duration of  the COVID-19 declaration under Section 564(b)(1) of the Act,  21 U.S.C. section 360bbb-3(b)(1), unless the authorization is terminated or revoked sooner.     Influenza A by PCR NEGATIVE NEGATIVE Final   Influenza B by PCR NEGATIVE NEGATIVE Final    Comment: (NOTE) The Xpert Xpress SARS-CoV-2/FLU/RSV plus assay is intended as an aid in the diagnosis of influenza from Nasopharyngeal swab specimens and should not be used as a sole basis for treatment. Nasal washings and aspirates are unacceptable for Xpert Xpress SARS-CoV-2/FLU/RSV testing.  Fact Sheet for Patients: BloggerCourse.com  Fact Sheet for Healthcare Providers: SeriousBroker.it  This test is not yet approved or cleared by the [place] FDA and has been authorized for detection and/or diagnosis of SARS-CoV-2 by FDA under an Emergency Use Authorization (EUA). This EUA will remain in effect (meaning this test can be used) for the duration of the COVID-19 declaration under Section 564(b)(1) of the Act, 21 U.S.C. section 360bbb-3(b)(1), unless the authorization is terminated or revoked.  Performed at [facility], [address] [place], [place] [address]   Blood culture (routine x 2)     Status: None   Collection Time: 05/17/20 10:50 PM   Specimen: BLOOD  Result Value Ref Range Status   Specimen Description BLOOD BLOOD RIGHT FOREARM  Final   Special Requests   Final    BOTTLES DRAWN AEROBIC AND ANAEROBIC Blood Culture results may not be optimal due to an excessive volume of blood received in culture bottles   Culture   Final    NO GROWTH 5 DAYS Performed at [facility], [address],  [place], [place] [address]    Report Status 05/22/2020 FINAL  Final  Blood culture (routine x 2)     Status: None   Collection Time: 05/17/20 10:55 PM   Specimen: BLOOD  Result Value Ref Range Status   Specimen Description BLOOD BLOOD LEFT FOREARM  Final   Special Requests   Final    BOTTLES DRAWN AEROBIC AND ANAEROBIC Blood Culture adequate volume   Culture   Final    NO GROWTH 5 DAYS Performed at [facility], [address], [place], [place] [address]    Report Status 05/22/2020 FINAL  Final         Radiology Studies: CT MAXILLOFACIAL WO CONTRAST  Result Date: 05/23/2020 CLINICAL DATA:  TMJ dislocations. EXAM: CT MAXILLOFACIAL WITHOUT CONTRAST TECHNIQUE: Multidetector CT imaging of the maxillofacial structures was performed. Multiplanar CT image reconstructions were also generated. COMPARISON:  None. FINDINGS: The mandibular condyles are dislocated anteriorly. They are anterior to the condylar eminence is. No fracture is identified. Fairly extensive dental disease is noted with dental caries and areas of periapical lucency. The facial bones are intact. No facial bone fractures are identified. The bony orbits are intact. The globes are normal. Normal intraorbital fat. The paranasal sinuses and mastoid air cells are clear. No significant intracranial abnormalities are identified. Vascular calcifications are noted. 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 No significant subcutaneous soft tissue abnormalities. IMPRESSION: 1. Anterior dislocation of the mandibular condyles. 2. No facial bone fractures are identified. 3. Fairly extensive dental disease. 4. 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 Electronically Signed   By: Rudie Meyer M.D.   On: 05/23/2020 12:28        Scheduled Meds: . amLODipine  10 mg Oral Daily  . vitamin C  500 mg Oral Daily  . aspirin EC  81 mg Oral Daily  . clopidogrel  75 mg Oral Daily  .  dexamethasone  6 mg Oral Daily  . hydrALAZINE  5 mg Oral Q8H  . influenza vac split quadrivalent PF  0.5 mL Intramuscular Tomorrow-1000  . insulin aspart  0-15 Units Subcutaneous TID WC  . insulin detemir  0.075 Units/kg Subcutaneous BID  . Ipratropium-Albuterol  1 puff Inhalation Q6H  . senna  1 tablet Oral BID  . zinc sulfate  220 mg Oral Daily   Continuous Infusions: . sodium chloride 50 mL/hr at 05/25/20 0103     LOS: 7 days    Time spent:40 min    Sayed Apostol, Roselind MessierURTIS J, MD [facility] Pager [phone]  If 7PM-7AM, please contact night-coverage [email or web address] Password [email or web address] 05/25/2020, 9:41 AM

## 2020-05-26 ENCOUNTER — Encounter: Payer: Self-pay | Admitting: Otolaryngology

## 2020-05-26 LAB — CBC WITH DIFFERENTIAL/PLATELET
Abs Immature Granulocytes: 0.42 10*3/uL — ABNORMAL HIGH (ref 0.00–0.07)
Basophils Absolute: 0 10*3/uL (ref 0.0–0.1)
Basophils Relative: 0 %
Eosinophils Absolute: 0 10*3/uL (ref 0.0–0.5)
Eosinophils Relative: 0 %
HCT: 34.6 % — ABNORMAL LOW (ref 36.0–46.0)
Hemoglobin: 11.4 g/dL — ABNORMAL LOW (ref 12.0–15.0)
Immature Granulocytes: 5 %
Lymphocytes Relative: 15 %
Lymphs Abs: 1.1 10*3/uL (ref 0.7–4.0)
MCH: 28.4 pg (ref 26.0–34.0)
MCHC: 32.9 g/dL (ref 30.0–36.0)
MCV: 86.1 fL (ref 80.0–100.0)
Monocytes Absolute: 1.1 10*3/uL — ABNORMAL HIGH (ref 0.1–1.0)
Monocytes Relative: 15 %
Neutro Abs: 5 10*3/uL (ref 1.7–7.7)
Neutrophils Relative %: 65 %
Platelets: 273 10*3/uL (ref 150–400)
RBC: 4.02 MIL/uL (ref 3.87–5.11)
RDW: 13.3 % (ref 11.5–15.5)
WBC: 7.7 10*3/uL (ref 4.0–10.5)
nRBC: 0 % (ref 0.0–0.2)

## 2020-05-26 LAB — PHOSPHORUS: Phosphorus: 3.1 mg/dL (ref 2.5–4.6)

## 2020-05-26 LAB — C-REACTIVE PROTEIN: CRP: 0.6 mg/dL (ref <1.0)

## 2020-05-26 LAB — FERRITIN: Ferritin: 391 ng/mL — ABNORMAL HIGH (ref 11–307)

## 2020-05-26 LAB — COMPREHENSIVE METABOLIC PANEL
ALT: 14 U/L (ref 0–44)
AST: 11 U/L — ABNORMAL LOW (ref 15–41)
Albumin: 2.7 g/dL — ABNORMAL LOW (ref 3.5–5.0)
Alkaline Phosphatase: 47 U/L (ref 38–126)
Anion gap: 7 (ref 5–15)
BUN: 29 mg/dL — ABNORMAL HIGH (ref 8–23)
CO2: 24 mmol/L (ref 22–32)
Calcium: 9.7 mg/dL (ref 8.9–10.3)
Chloride: 107 mmol/L (ref 98–111)
Creatinine, Ser: 0.99 mg/dL (ref 0.44–1.00)
GFR, Estimated: >60 mL/min (ref >60)
Glucose, Bld: 100 mg/dL — ABNORMAL HIGH (ref 70–99)
Potassium: 4.1 mmol/L (ref 3.5–5.1)
Sodium: 138 mmol/L (ref 135–145)
Total Bilirubin: 0.7 mg/dL (ref 0.3–1.2)
Total Protein: 5.4 g/dL — ABNORMAL LOW (ref 6.5–8.1)

## 2020-05-26 LAB — GLUCOSE, CAPILLARY
Glucose-Capillary: 95 mg/dL (ref 70–99)
Glucose-Capillary: 115 mg/dL — ABNORMAL HIGH (ref 70–99)
Glucose-Capillary: 135 mg/dL — ABNORMAL HIGH (ref 70–99)
Glucose-Capillary: 121 mg/dL — ABNORMAL HIGH (ref 70–99)

## 2020-05-26 LAB — MAGNESIUM: Magnesium: 2.2 mg/dL (ref 1.7–2.4)

## 2020-05-26 LAB — D-DIMER, QUANTITATIVE: D-Dimer, Quant: 1.07 ug/mL-FEU — ABNORMAL HIGH (ref 0.00–0.50)

## 2020-05-26 LAB — LACTATE DEHYDROGENASE: LDH: 123 U/L (ref 98–192)

## 2020-05-26 MED ORDER — CLONIDINE HCL 0.1 MG PO TABS
0.1000 mg | ORAL_TABLET | Freq: Two times a day (BID) | ORAL | Status: DC
  Administered 2020-05-26 – 2020-05-28 (×4): 0.1 mg via ORAL
  Filled 2020-05-26 (×4): qty 1

## 2020-05-26 NOTE — Plan of Care (Signed)
Pt remains on 2L [place]. Pt c/o of HA relieved by prn pain med. Pt taking pills whole with applesauce. Speech muffled at times and lips swollen. Pt able to use bedside commode. Safety measures in place. Will continue to monitor.  Problem: Education: Goal: Knowledge of General Education information will improve Description: Including pain rating scale, medication(s)/side effects and non-pharmacologic comfort measures 05/26/2020 0330 by Darolyn Rua, RN Outcome: Progressing 05/26/2020 0330 by Darolyn Rua, RN Outcome: Progressing   Problem: Health Behavior/Discharge Planning: Goal: Ability to manage health-related needs will improve 05/26/2020 0330 by Darolyn Rua, RN Outcome: Progressing 05/26/2020 0330 by Darolyn Rua, RN Outcome: Progressing   Problem: Clinical Measurements: Goal: Ability to maintain clinical measurements within normal limits will improve 05/26/2020 0330 by Darolyn Rua, RN Outcome: Progressing 05/26/2020 0330 by Darolyn Rua, RN Outcome: Progressing Goal: Will remain free from infection 05/26/2020 0330 by Darolyn Rua, RN Outcome: Progressing 05/26/2020 0330 by Darolyn Rua, RN Outcome: Progressing Goal: Diagnostic test results will improve 05/26/2020 0330 by Darolyn Rua, RN Outcome: Progressing 05/26/2020 0330 by Darolyn Rua, RN Outcome: Progressing Goal: Respiratory complications will improve 05/26/2020 0330 by Darolyn Rua, RN Outcome: Progressing 05/26/2020 0330 by Darolyn Rua, RN Outcome: Progressing Goal: Cardiovascular complication will be avoided 05/26/2020 0330 by Darolyn Rua, RN Outcome: Progressing 05/26/2020 0330 by Darolyn Rua, RN Outcome: Progressing   Problem: Activity: Goal: Risk for activity intolerance will decrease 05/26/2020 0330 by Darolyn Rua, RN Outcome: Progressing 05/26/2020 0330 by Darolyn Rua, RN Outcome: Progressing   Problem: Nutrition: Goal: Adequate nutrition will be  maintained 05/26/2020 0330 by Darolyn Rua, RN Outcome: Progressing 05/26/2020 0330 by Darolyn Rua, RN Outcome: Progressing   Problem: Coping: Goal: Level of anxiety will decrease 05/26/2020 0330 by Darolyn Rua, RN Outcome: Progressing 05/26/2020 0330 by Darolyn Rua, RN Outcome: Progressing   Problem: Elimination: Goal: Will not experience complications related to bowel motility 05/26/2020 0330 by Darolyn Rua, RN Outcome: Progressing 05/26/2020 0330 by Darolyn Rua, RN Outcome: Progressing Goal: Will not experience complications related to urinary retention 05/26/2020 0330 by Darolyn Rua, RN Outcome: Progressing 05/26/2020 0330 by Darolyn Rua, RN Outcome: Progressing   Problem: Pain Managment: Goal: General experience of comfort will improve 05/26/2020 0330 by Darolyn Rua, RN Outcome: Progressing 05/26/2020 0330 by Darolyn Rua, RN Outcome: Progressing   Problem: Safety: Goal: Ability to remain free from injury will improve 05/26/2020 0330 by Darolyn Rua, RN Outcome: Progressing 05/26/2020 0330 by Darolyn Rua, RN Outcome: Progressing   Problem: Skin Integrity: Goal: Risk for impaired skin integrity will decrease 05/26/2020 0330 by Darolyn Rua, RN Outcome: Progressing 05/26/2020 0330 by Darolyn Rua, RN Outcome: Progressing

## 2020-05-26 NOTE — Progress Notes (Signed)
PROGRESS NOTE    Stephanie [name]  OIT:[id number] DOB: [birth year] DOA: 05/17/2020 PCP: Pcp, No     Brief Narrative:  65 y.o.BF PMHx Hysterectomy.She reports that she has felt ill for about 3 weeks. Initially she had diarrhea which did clear. However, she has had progressive weakness to the point where it is difficult to manage ADLs and she has had progressive SOB with a non-productive cough. Due to her symptoms she presents to [facility] for evaluation. Upon arriving the hospital, she was found to be Covid positive. Chest x-ray showed patchyinfiltrates. She was started on IV steroids, remdesivir. 1/2.MRI showed acute stroke.  Giving IV Lasix for exacerbation of congestive heart failure.   Subjective: 1/10 afebrile overnight, A/O x4.  States jaw pain has resolved      Assessment & Plan: Covid vaccination; unvaccinated   Active Problems:   Pneumonia due to COVID-19 virus   Dehydration   Acute ischemic left ACA stroke (HCC)   Acute hypoxemic respiratory failure (HCC)  Acute respiratory failure with hypoxia/Covid 19 pneumonia COVID-19 Labs  Recent Labs    05/24/20 0556 05/25/20 0537 05/26/20 0627  DDIMER 0.69* 0.69* 1.07*  FERRITIN 488* 422* 391*  LDH 161 137 123  CRP 0.9 0.6 0.6    Lab Results  Component Value Date   SARSCOV2NAA POSITIVE (A) 05/17/2020  -Multilobe pneumonia secondary to COVID-19. -Remdesivir per pharmacy protocol -Decadron 6 mg daily -Vitamins per Covid protocol  Acute on chronic Diastolic CHF  -Ruled in  -Strict in and out -3.4 L -Daily weight Filed Weights   05/17/20 2141  Weight: 65.8 kg  -Amlodipine 10 mg daily - 1/9 increase Hydralazine 50 mg TID -Hydralazine PRN - 1/7 normal saline 50 ml/hr -1/10 clonidine 0.1 mg BID  Essential HTN -See CHF  Acute ischemic stroke -Right ICA aneurysm. -Patient is seen by neurology.  Currently on aspirin and Plavix. -Patient will need follow-up on ICA aneurysm as outpatient. -Patient will also  need follow-up with cardiology as outpatient for loop recorder. -PT/OT recommended SNF -awaiting SNF placement.  Acute kidney injury Secondary to Covid. Renal function has improved. Lab Results  Component Value Date   CREATININE 0.99 05/26/2020   CREATININE 1.01 (H) 05/25/2020   CREATININE 1.08 (H) 05/24/2020   CREATININE 1.13 (H) 05/23/2020   CREATININE 1.26 (H) 05/22/2020  -Resolved  Anterior dislocation of the mandibular condyles -1/7 consulted ENT Dr Elenore Rota who stated he will see patient later this evening after he completes office hours. -1/7 Closed reduction of mandibular dislocation bilaterally,  -1/8 patient feeling significantly better.  Goals of care - PT/OT; recommend SNF     DVT prophylaxis: Lovenox Code Status:  Family Communication: 1/7 spoke with [name] patient's POC discussed plan of care answered all questions status is: Inpatient    Dispo: The patient is from: Home  Anticipated d/c is to: SNF  Anticipated d/c date is: 3 days  Patient currently is not medically stable to d/c.     Consultants:  Neurology ENT Dr Elenore Rota     Procedures/Significant Events:  1/3 Echocardiogram;LVEF= 55 to 60%.  -Grade I diastolic dysfunction (impaired relaxation).  1/7 Closed reduction of mandibular dislocation bilaterally  I have personally reviewed and interpreted all radiology studies and my findings are as above.  VENTILATOR SETTINGS: Nasal cannula 1/9 Flow 2 L/min SPO2 98%   Cultures   Antimicrobials: Anti-infectives (From admission, onward)   Start     Ordered Stop   05/22/20 1000  remdesivir 100 mg in sodium chloride 0.9 %  100 mL IVPB        05/21/20 1347     05/20/20 1600  remdesivir 100 mg in sodium chloride 0.9 % 100 mL IVPB  Status:  Discontinued        05/20/20 1442 05/21/20 1346   05/18/20 1000  remdesivir 100 mg in sodium chloride 0.9 % 100 mL IVPB       "Followed by" Linked Group Details    05/18/20 0014 05/22/20 0959   05/18/20 0015  remdesivir 200 mg in sodium chloride 0.9% 250 mL IVPB       "Followed by" Linked Group Details   05/18/20 0014 05/18/20 0232   05/17/20 2300  cefTRIAXone (ROCEPHIN) 1 g in sodium chloride 0.9 % 100 mL IVPB        05/17/20 2249 05/18/20 0022   05/17/20 2300  azithromycin (ZITHROMAX) 500 mg in sodium chloride 0.9 % 250 mL IVPB        05/17/20 2249 05/18/20 0235       Devices    LINES / TUBES:      Continuous Infusions: . sodium chloride 50 mL/hr at 05/25/20 1910     Objective: Vitals:   05/26/20 0518 05/26/20 0820 05/26/20 1344 05/26/20 1558  BP: (!) 164/81 (!) 170/86 133/70 (!) 160/85  Pulse: 60 (!) 57 (!) 51 (!) 110  Resp: 18   16  Temp: 97.8 F (36.6 C) 97.7 F (36.5 C) (!) 97.5 F (36.4 C) 97.6 F (36.4 C)  TempSrc: Oral Oral Oral Oral  SpO2: 93% 98% 98% 92%  Weight:      Height:        Intake/Output Summary (Last 24 hours) at 05/26/2020 1824 Last data filed at 05/26/2020 1700 Gross per 24 hour  Intake 480 ml  Output 440 ml  Net 40 ml   Filed Weights   05/17/20 2141  Weight: 65.8 kg    Examination:  General: A/O x4, No acute respiratory distress Eyes: negative scleral hemorrhage, negative anisocoria, negative icterus ENT: Negative Runny nose, negative gingival bleeding,  Neck:  Negative scars, masses, torticollis, lymphadenopathy, JVD Lungs: Clear to auscultation bilaterally without wheezes or crackles Cardiovascular: Regular rate and rhythm without murmur gallop or rub normal S1 and S2 Abdomen: negative abdominal pain, nondistended, positive soft, bowel sounds, no rebound, no ascites, no appreciable mass Extremities: No significant cyanosis, clubbing, or edema bilateral lower extremities Skin: Negative rashes, lesions, ulcers Psychiatric:  Negative depression, negative anxiety, negative fatigue, negative mania  Central nervous system:  Cranial nerves II through XII intact, tongue/uvula midline, all  extremities muscle strength 5/5, sensation intact throughout, finger nose finger bilateral past-pointing, quick finger touch bilateral difficult to accomplish, positive dysarthria, negative expressive aphasia, negative receptive aphasia.  .     Data Reviewed: Care during the described time interval was provided by me .  I have reviewed this patient's available data, including medical history, events of note, physical examination, and all test results as part of my evaluation.  CBC: Recent Labs  Lab 05/22/20 0500 05/23/20 1001 05/24/20 0556 05/25/20 0537 05/26/20 0627  WBC 9.0 7.7 6.9 7.3 7.7  NEUTROABS 6.7 4.9 4.8 4.8 5.0  HGB 12.5 12.8 13.2 12.4 11.4*  HCT 37.5 39.0 39.5 36.5 34.6*  MCV 84.5 85.0 85.3 85.5 86.1  PLT 276 303 337 293 273   Basic Metabolic Panel: Recent Labs  Lab 05/22/20 0500 05/23/20 0333 05/23/20 1001 05/24/20 0556 05/25/20 0537 05/26/20 0627  NA 140 139  --  138 139 138  K  3.8 4.0  --  4.2 4.0 4.1  CL 105 104  --  103 108 107  CO2 28 26  --  26 26 24   GLUCOSE 105* 94  --  100* 99 100*  BUN 41* 38*  --  36* 33* 29*  CREATININE 1.26* 1.13*  --  1.08* 1.01* 0.99  CALCIUM 10.2 9.8  --  10.4* 10.1 9.7  MG 2.4  --  2.6* 2.6* 2.3 2.2  PHOS 3.2  --  3.5 4.0 2.8 3.1   GFR: Estimated Creatinine Clearance: 60.4 mL/min (by C-G formula based on SCr of 0.99 mg/dL). Liver Function Tests: Recent Labs  Lab 05/22/20 0500 05/23/20 0333 05/24/20 0556 05/25/20 0537 05/26/20 0627  AST 13* 14* 15 13* 11*  ALT 11 12 13 14 14   ALKPHOS 56 51 59 48 47  BILITOT 0.8 0.7 0.9 0.8 0.7  PROT 6.4* 6.2* 6.4* 5.8* 5.4*  ALBUMIN 3.0* 2.9* 3.2* 2.9* 2.7*   No results for input(s): LIPASE, AMYLASE in the last 168 hours. No results for input(s): AMMONIA in the last 168 hours. Coagulation Profile: No results for input(s): INR, PROTIME in the last 168 hours. Cardiac Enzymes: No results for input(s): CKTOTAL, CKMB, CKMBINDEX, TROPONINI in the last 168 hours. BNP (last 3  results) No results for input(s): PROBNP in the last 8760 hours. HbA1C: No results for input(s): HGBA1C in the last 72 hours. CBG: Recent Labs  Lab 05/25/20 2032 05/25/20 2059 05/26/20 0828 05/26/20 1202 05/26/20 1553  GLUCAP 118* 129* 95 115* 135*   Lipid Profile: No results for input(s): CHOL, HDL, LDLCALC, TRIG, CHOLHDL, LDLDIRECT in the last 72 hours. Thyroid Function Tests: No results for input(s): TSH, T4TOTAL, FREET4, T3FREE, THYROIDAB in the last 72 hours. Anemia Panel: Recent Labs    05/25/20 0537 05/26/20 0627  FERRITIN 422* 391*   Sepsis Labs: Recent Labs  Lab 05/20/20 0412  PROCALCITON 0.16    Recent Results (from the past 240 hour(s))  Resp Panel by RT-PCR (Flu A&B, Covid) Nasopharyngeal Swab     Status: Abnormal   Collection Time: 05/17/20 10:31 PM   Specimen: Nasopharyngeal Swab; Nasopharyngeal(NP) swabs in vial transport medium  Result Value Ref Range Status   SARS Coronavirus 2 by RT PCR POSITIVE (A) NEGATIVE Final    Comment: RESULT CALLED TO, READ BACK BY AND VERIFIED WITH: jenna elington @2350  on 05/17/20 skl (NOTE) SARS-CoV-2 target nucleic acids are DETECTED.  The SARS-CoV-2 RNA is generally detectable in upper respiratory specimens during the acute phase of infection. Positive results are indicative of the presence of the identified virus, but do not rule out bacterial infection or co-infection with other pathogens not detected by the test. Clinical correlation with patient history and other diagnostic information is necessary to determine patient infection status. The expected result is Negative.  Fact Sheet for Patients: 07/15/20  Fact Sheet for Healthcare Providers:  This test is not yet approved or cleared by the 07/15/20 FDA and  has been authorized for detection and/or diagnosis of SARS-CoV-2 by FDA under an Emergency Use Authorization (EUA).  This  EUA will remain in effect (meaning this test can  be used) for the duration of  the COVID-19 declaration under Section 564(b)(1) of the Act, 21 U.S.C. section 360bbb-3(b)(1), unless the authorization is terminated or revoked sooner.     Influenza A by PCR NEGATIVE NEGATIVE Final   Influenza B by PCR NEGATIVE NEGATIVE Final    Comment: (NOTE) The Xpert Xpress SARS-CoV-2/FLU/RSV plus assay is  intended as an aid in the diagnosis of influenza from Nasopharyngeal swab specimens and should not be used as a sole basis for treatment. Nasal washings and aspirates are unacceptable for Xpert Xpress SARS-CoV-2/FLU/RSV testing.  Fact Sheet for Patients: BloggerCourse.com  Fact Sheet for Healthcare Providers: SeriousBroker.it  This test is not yet approved or cleared by the [place] FDA and has been authorized for detection and/or diagnosis of SARS-CoV-2 by FDA under an Emergency Use Authorization (EUA). This EUA will remain in effect (meaning this test can be used) for the duration of the COVID-19 declaration under Section 564(b)(1) of the Act, 21 U.S.C. section 360bbb-3(b)(1), unless the authorization is terminated or revoked.  Performed at [facility], [address] [place], [place] [address]   Blood culture (routine x 2)     Status: None   Collection Time: 05/17/20 10:50 PM   Specimen: BLOOD  Result Value Ref Range Status   Specimen Description BLOOD BLOOD RIGHT FOREARM  Final   Special Requests   Final    BOTTLES DRAWN AEROBIC AND ANAEROBIC Blood Culture results may not be optimal due to an excessive volume of blood received in culture bottles   Culture   Final    NO GROWTH 5 DAYS Performed at [facility], [address] [place], [place] [address]    Report Status 05/22/2020 FINAL  Final  Blood culture (routine x 2)     Status: None   Collection Time: 05/17/20 10:55 PM   Specimen: BLOOD  Result Value  Ref Range Status   Specimen Description BLOOD BLOOD LEFT FOREARM  Final   Special Requests   Final    BOTTLES DRAWN AEROBIC AND ANAEROBIC Blood Culture adequate volume   Culture   Final    NO GROWTH 5 DAYS Performed at [facility], [address], [place], [place] [address]    Report Status 05/22/2020 FINAL  Final         Radiology Studies: No results found.      Scheduled Meds: . amLODipine  10 mg Oral Daily  . vitamin C  500 mg Oral Daily  . aspirin EC  81 mg Oral Daily  . clopidogrel  75 mg Oral Daily  . dexamethasone  6 mg Oral Daily  . hydrALAZINE  15 mg Oral Q8H  . influenza vac split quadrivalent PF  0.5 mL Intramuscular Tomorrow-1000  . insulin aspart  0-15 Units Subcutaneous TID WC  . insulin detemir  0.075 Units/kg Subcutaneous BID  . Ipratropium-Albuterol  1 puff Inhalation Q6H  . senna  1 tablet Oral BID  . zinc sulfate  220 mg Oral Daily   Continuous Infusions: . sodium chloride 50 mL/hr at 05/25/20 1910     LOS: 8 days    Time spent:40 min    Ai Sonnenfeld, Roselind Messier, MD [facility] Pager [phone]  If 7PM-7AM, please contact night-coverage [email or web address] Password [email or web address] 05/26/2020, 6:24 PM

## 2020-05-26 NOTE — Progress Notes (Signed)
Physical Therapy Treatment Patient Details Name: Stephanie [name] MRN: [id number] DOB: 06/01/1955 Today's Date: 05/26/2020    History of Present Illness 65 y.o. female with no significant medical history except for hysterectomy. She reports that she has felt ill for about 3 weeks. Initially she had diarrhea which did clear. However, she has had progressive weakness to the point where it is difficult to manage ADLs and she has had progressive SOB with a non-productive cough. Due to her symptoms she presents to [facility] for evaluation.    PT Comments    Pt resting in bed upon PT arrival; requesting to go to [facility] to toilet.  SBA with bed mobility; CGA with transfers (vc's for UE placement) and CGA with very short ambulation trials with RW use (see below for details).  Improved tolerance to therapy activities noted.  Will continue to focus on strengthening and progressive functional mobility per pt tolerance.    Follow Up Recommendations  SNF     Equipment Recommendations  Rolling walker with 5" wheels;3in1 (PT)    Recommendations for Other Services       Precautions / Restrictions Precautions Precautions: Fall Restrictions Weight Bearing Restrictions: No    Mobility  Bed Mobility Overal bed mobility: Needs Assistance Bed Mobility: Supine to Sit;Sit to Supine     Supine to sit: Supervision (x1 trial R side of bed and x1 trial L side of bed) Sit to supine: Supervision   General bed mobility comments: mild increased effort to perform on own  Transfers Overall transfer level: Needs assistance Equipment used: None;Rolling walker (2 wheeled) Transfers: Sit to/from Stand Sit to Stand: Min guard Stand pivot transfers: Min guard (stand step turn bed to/from [facility] no UE support)       General transfer comment: CGA to stand from bed x3 trials and from recliner x1 trial; vc's for UE placement  Ambulation/Gait Ambulation/Gait assistance: Min guard Gait Distance (Feet):  (5 feet bed to  recliner; ambulated 3 feet forward and 3 feet backward x3 reps) Assistive device: Rolling walker (2 wheeled)   Gait velocity: decreased   General Gait Details: decreased B LE step length/foot clearance/heelstrike; steady with RW use   Stairs             Wheelchair Mobility    Modified Rankin (Stroke Patients Only)       Balance Overall balance assessment: Needs assistance Sitting-balance support: No upper extremity supported;Feet supported Sitting balance-Leahy Scale: Good Sitting balance - Comments: steady sitting reaching within BOS   Standing balance support: Bilateral upper extremity supported;During functional activity Standing balance-Leahy Scale: Good Standing balance comment: steady standing walking with RW                            Cognition Arousal/Alertness: Awake/alert Behavior During Therapy: Flat affect Overall Cognitive Status: No family/caregiver present to determine baseline cognitive functioning Area of Impairment: Safety/judgement;Following commands;Problem solving                       Following Commands: Follows one step commands with increased time Safety/Judgement: Decreased awareness of safety   Problem Solving: Slow processing        Exercises      General Comments   Nursing cleared pt for participation in physical therapy.  Pt agreeable to PT session.      Pertinent Vitals/Pain Pain Assessment: No/denies pain Pain Intervention(s): Limited activity within patient's tolerance;Monitored during session;Repositioned  Home Living                      Prior Function            PT Goals (current goals can now be found in the care plan section) Acute Rehab PT Goals Patient Stated Goal: return to PLOF working at [employer] PT Goal Formulation: With patient Time For Goal Achievement: 06/03/20 Potential to Achieve Goals: Fair Progress towards PT goals: Progressing toward goals     Frequency    Min 2X/week      PT Plan Current plan remains appropriate    Co-evaluation              AM-PAC PT "6 Clicks" Mobility   Outcome Measure  Help needed turning from your back to your side while in a flat bed without using bedrails?: None Help needed moving from lying on your back to sitting on the side of a flat bed without using bedrails?: A Little Help needed moving to and from a bed to a chair (including a wheelchair)?: A Little Help needed standing up from a chair using your arms (e.g., wheelchair or bedside chair)?: A Little Help needed to walk in hospital room?: A Little Help needed climbing 3-5 steps with a railing? : A Lot 6 Click Score: 18    End of Session Equipment Utilized During Treatment: Gait belt;Oxygen (3 L O2 via nasal cannula) Activity Tolerance: Patient tolerated treatment well Patient left: in chair;with call bell/phone within reach;with chair alarm set Nurse Communication: Mobility status;Precautions PT Visit Diagnosis: Unsteadiness on feet (R26.81);Other abnormalities of gait and mobility (R26.89);Muscle weakness (generalized) (M62.81);History of falling (Z91.81);Difficulty in walking, not elsewhere classified (R26.2);Pain     Time: 8466-5993 PT Time Calculation (min) (ACUTE ONLY): 31 min  Charges:  $Gait Training: 8-22 mins $Therapeutic Activity: 8-22 mins                    Hendricks Limes, PT 05/26/20, 1:35 PM

## 2020-05-26 NOTE — TOC Progression Note (Signed)
Transition of Care [facility]) - Progression Note    Patient Details  Name: Stephanie [name] MRN: [id number] Date of Birth: 1955-10-14  [facility] CM/SW Contact  Margarito Liner, LCSW Phone Number: 05/26/2020, 11:36 AM  Clinical Narrative:  Updated patient's aunt [place]. She confirmed the family will be able to provide 24/7 supervision. Discussed possibility of building up strength to discharge home prior to being able to go to CIR. She said if patient cane get to and use the bathroom or bedside commode independently, she will bring her home with her. Sent secure chat to PT to notify. [place] has MS and is unable to physically assist.  Expected Discharge Plan: Home w Home Health Services Barriers to Discharge: Continued Medical Work up  Expected Discharge Plan and Services Expected Discharge Plan: Home w Home Health Services     Post Acute Care Choice: Home Health Living arrangements for the past 2 months: Apartment                                       Social Determinants of Health (SDOH) Interventions    Readmission Risk Interventions No flowsheet data found.

## 2020-05-26 NOTE — Anesthesia Postprocedure Evaluation (Signed)
Anesthesia Post Note  Patient: Stephanie [name]  Procedure(s) Performed: CLOSED REDUCTION MANDIBULAR (Bilateral )  Patient location during evaluation: PACU Anesthesia Type: General Level of consciousness: awake and alert Pain management: pain level controlled Vital Signs Assessment: post-procedure vital signs reviewed and stable Respiratory status: spontaneous breathing, nonlabored ventilation, respiratory function stable and patient connected to nasal cannula oxygen Cardiovascular status: blood pressure returned to baseline and stable Postop Assessment: no apparent nausea or vomiting Anesthetic complications: no   No complications documented.   Last Vitals:  Vitals:   05/26/20 0820 05/26/20 1344  BP: (!) 170/86 133/70  Pulse: (!) 57 (!) 51  Resp:    Temp: 36.5 C (!) 36.4 C  SpO2: 98% 98%    Last Pain:  Vitals:   05/26/20 1344  TempSrc: Oral  PainSc:                  Cleda Mccreedy Dejanira [place]

## 2020-05-27 LAB — CBC WITH DIFFERENTIAL/PLATELET
Abs Immature Granulocytes: 0.45 10*3/uL — ABNORMAL HIGH (ref 0.00–0.07)
Basophils Absolute: 0 10*3/uL (ref 0.0–0.1)
Basophils Relative: 0 %
Eosinophils Absolute: 0 10*3/uL (ref 0.0–0.5)
Eosinophils Relative: 0 %
HCT: 35.3 % — ABNORMAL LOW (ref 36.0–46.0)
Hemoglobin: 11.8 g/dL — ABNORMAL LOW (ref 12.0–15.0)
Immature Granulocytes: 6 %
Lymphocytes Relative: 14 %
Lymphs Abs: 1.2 10*3/uL (ref 0.7–4.0)
MCH: 28.4 pg (ref 26.0–34.0)
MCHC: 33.4 g/dL (ref 30.0–36.0)
MCV: 85.1 fL (ref 80.0–100.0)
Monocytes Absolute: 1.1 10*3/uL — ABNORMAL HIGH (ref 0.1–1.0)
Monocytes Relative: 13 %
Neutro Abs: 5.4 10*3/uL (ref 1.7–7.7)
Neutrophils Relative %: 67 %
Platelets: 259 10*3/uL (ref 150–400)
RBC: 4.15 MIL/uL (ref 3.87–5.11)
RDW: 13.2 % (ref 11.5–15.5)
Smear Review: NORMAL
WBC: 8 10*3/uL (ref 4.0–10.5)
nRBC: 0 % (ref 0.0–0.2)

## 2020-05-27 LAB — C-REACTIVE PROTEIN: CRP: 0.5 mg/dL (ref <1.0)

## 2020-05-27 LAB — COMPREHENSIVE METABOLIC PANEL
ALT: 11 U/L (ref 0–44)
AST: 9 U/L — ABNORMAL LOW (ref 15–41)
Albumin: 2.7 g/dL — ABNORMAL LOW (ref 3.5–5.0)
Alkaline Phosphatase: 44 U/L (ref 38–126)
Anion gap: 7 (ref 5–15)
BUN: 26 mg/dL — ABNORMAL HIGH (ref 8–23)
CO2: 25 mmol/L (ref 22–32)
Calcium: 10 mg/dL (ref 8.9–10.3)
Chloride: 105 mmol/L (ref 98–111)
Creatinine, Ser: 0.85 mg/dL (ref 0.44–1.00)
GFR, Estimated: >60 mL/min (ref >60)
Glucose, Bld: 88 mg/dL (ref 70–99)
Potassium: 4.2 mmol/L (ref 3.5–5.1)
Sodium: 137 mmol/L (ref 135–145)
Total Bilirubin: 0.8 mg/dL (ref 0.3–1.2)
Total Protein: 5.3 g/dL — ABNORMAL LOW (ref 6.5–8.1)

## 2020-05-27 LAB — GLUCOSE, CAPILLARY
Glucose-Capillary: 89 mg/dL (ref 70–99)
Glucose-Capillary: 93 mg/dL (ref 70–99)
Glucose-Capillary: 128 mg/dL — ABNORMAL HIGH (ref 70–99)
Glucose-Capillary: 105 mg/dL — ABNORMAL HIGH (ref 70–99)

## 2020-05-27 LAB — LACTATE DEHYDROGENASE: LDH: 133 U/L (ref 98–192)

## 2020-05-27 LAB — PHOSPHORUS: Phosphorus: 2.9 mg/dL (ref 2.5–4.6)

## 2020-05-27 LAB — MAGNESIUM: Magnesium: 2.1 mg/dL (ref 1.7–2.4)

## 2020-05-27 LAB — FERRITIN: Ferritin: 353 ng/mL — ABNORMAL HIGH (ref 11–307)

## 2020-05-27 LAB — D-DIMER, QUANTITATIVE: D-Dimer, Quant: 1.24 ug/mL-FEU — ABNORMAL HIGH (ref 0.00–0.50)

## 2020-05-27 NOTE — Progress Notes (Signed)
Occupational Therapy Treatment Patient Details Name: Stephanie [name] MRN: [id number] DOB: 17-May-1956 Today's Date: 05/27/2020    History of present illness 65 y.o. female with no significant medical history except for hysterectomy. She reports that she has felt ill for about 3 weeks. Initially she had diarrhea which did clear. However, she has had progressive weakness to the point where it is difficult to manage ADLs and she has had progressive SOB with a non-productive cough. Due to her symptoms she presents to [facility] for evaluation.   OT comments  Stephanie [name] seen for OT treatment on this date. Upon arrival to room pt reclined in bed citing fatigue and feeling cold, defers OOB mobility this date. SUPERVISION rolling and MOD A for LBD at bed level. Extensive education given re:  d/c recs, HEP, and importance of mobility for functional strengthening. Pt states her goal is to go to rehab as she does not believe she would have enough assistance if d/c home. Pt making progress toward goals. Pt continues to benefit from skilled OT services to maximize return to PLOF and minimize risk of future falls, injury, caregiver burden, and readmission. Will continue to follow POC. Discharge recommendation remains appropriate.    Follow Up Recommendations  SNF;Supervision/Assistance - 24 hour    Equipment Recommendations  Other (comment);3 in 1 bedside commode (RW)    Recommendations for Other Services      Precautions / Restrictions Precautions Precautions: Fall Restrictions Weight Bearing Restrictions: No       Mobility Bed Mobility Overal bed mobility: Needs Assistance Bed Mobility: Rolling Rolling: Supervision            Transfers                 General transfer comment: Pt deferred citing fatigued and cold        ADL either performed or assessed with clinical judgement   ADL Overall ADL's : Needs assistance/impaired                                        General ADL Comments: MAX A for LBD at bed level. SETUP self-feeding at bed level               Cognition Arousal/Alertness: Awake/alert Behavior During Therapy: Flat affect Overall Cognitive Status: No family/caregiver present to determine baseline cognitive functioning Area of Impairment: Safety/judgement;Following commands;Problem solving                 Orientation Level: Disoriented to;Time;Situation     Following Commands: Follows one step commands with increased time Safety/Judgement: Decreased awareness of safety              Exercises Exercises: Other exercises Other Exercises Other Exercises: Pt educated re: OT role, d/c recs, HEP, importance of mobility for functional strengthening Other Exercises: LBD, rolling           Pertinent Vitals/ Pain       Pain Assessment: No/denies pain         Frequency  Min 2X/week        Progress Toward Goals  OT Goals(current goals can now be found in the care plan section)  Progress towards OT goals: Progressing toward goals  Acute Rehab OT Goals Patient Stated Goal: return to PLOF working at [employer] OT Goal Formulation: With patient Time For Goal Achievement: 06/03/20 Potential to Achieve Goals: Good ADL Goals  Pt Will Perform Grooming: with modified independence;standing Pt Will Transfer to Toilet: ambulating;with supervision Pt Will Perform Toileting - Clothing Manipulation and hygiene: with supervision;sit to/from stand  Plan Discharge plan remains appropriate;Frequency remains appropriate       AM-PAC OT "6 Clicks" Daily Activity     Outcome Measure   Help from another person eating meals?: A Little Help from another person taking care of personal grooming?: A Little Help from another person toileting, which includes using toliet, bedpan, or urinal?: A Little Help from another person bathing (including washing, rinsing, drying)?: A Lot Help from another person to put on and  taking off regular upper body clothing?: A Little Help from another person to put on and taking off regular lower body clothing?: A Lot 6 Click Score: 16    End of Session Equipment Utilized During Treatment: Oxygen  OT Visit Diagnosis: Unsteadiness on feet (R26.81);Repeated falls (R29.6);Muscle weakness (generalized) (M62.81)   Activity Tolerance Patient limited by fatigue   Patient Left in bed;with call bell/phone within reach;with bed alarm set   Nurse Communication          Time: 3710-6269 OT Time Calculation (min): 14 min  Charges: OT General Charges $OT Visit: 1 Visit OT Treatments $Self Care/Home Management : 8-22 mins  Kathie Dike, M.S. OTR/L  05/27/20, 11:02 AM  ascom [phone]

## 2020-05-27 NOTE — Care Plan (Signed)
Flowers delivered at this time.

## 2020-05-27 NOTE — Progress Notes (Signed)
PROGRESS NOTE    Stephanie [name]  PYK:[id number] DOB: 18-Mar-1956 DOA: 05/17/2020 PCP: Pcp, No     Brief Narrative:  65 y.o.BF PMHx Hysterectomy.She reports that she has felt ill for about 3 weeks. Initially she had diarrhea which did clear. However, she has had progressive weakness to the point where it is difficult to manage ADLs and she has had progressive SOB with a non-productive cough. Due to her symptoms she presents to [facility] for evaluation. Upon arriving the hospital, she was found to be Covid positive. Chest x-ray showed patchyinfiltrates. She was started on IV steroids, remdesivir. 1/2.MRI showed acute stroke.  Giving IV Lasix for exacerbation of congestive heart failure.   Subjective: 1/11 afebrile overnight, A/O x4.  Patient able to move her jaw almost in a normal manner.  Speech significantly improved with decrease in swelling and pain   Assessment & Plan: Covid vaccination; unvaccinated   Active Problems:   Pneumonia due to COVID-19 virus   Dehydration   Acute ischemic left ACA stroke (HCC)   Acute hypoxemic respiratory failure (HCC)  Acute respiratory failure with hypoxia/Covid 19 pneumonia COVID-19 Labs  Recent Labs    05/25/20 0537 05/26/20 0627 05/27/20 0443  DDIMER 0.69* 1.07* 1.24*  FERRITIN 422* 391* 353*  LDH 137 123 133  CRP 0.6 0.6 0.5    Lab Results  Component Value Date   SARSCOV2NAA POSITIVE (A) 05/17/2020  -Multilobe pneumonia secondary to COVID-19. -Remdesivir per pharmacy protocol -Decadron 6 mg daily -Vitamins per Covid protocol  Acute on chronic Diastolic CHF  -Ruled in  -Strict in and out -3.4 L -Daily weight Filed Weights   05/17/20 2141  Weight: 65.8 kg  -Amlodipine 10 mg daily - 1/9 increase Hydralazine 50 mg TID -Hydralazine PRN - 1/7 normal saline 50 ml/hr -1/10 clonidine 0.1 mg BID  Essential HTN -See CHF  Acute ischemic stroke -Right ICA aneurysm. -Patient is seen by neurology.  Currently on aspirin  and Plavix. -Patient will need follow-up on ICA aneurysm as outpatient. -Patient will also need follow-up with cardiology as outpatient for loop recorder. -PT/OT recommended SNF -awaiting SNF placement.  Acute kidney injury Secondary to Covid. Renal function has improved. Lab Results  Component Value Date   CREATININE 0.85 05/27/2020   CREATININE 0.99 05/26/2020   CREATININE 1.01 (H) 05/25/2020   CREATININE 1.08 (H) 05/24/2020   CREATININE 1.13 (H) 05/23/2020  -Resolved  Anterior dislocation of the mandibular condyles -1/7 consulted ENT Dr Elenore Rota who stated he will see patient later this evening after he completes office hours. -1/7 Closed reduction of mandibular dislocation bilaterally,  -1/8 patient feeling significantly better.  Goals of care - PT/OT; recommend SNF     DVT prophylaxis: Lovenox Code Status:  Family Communication: 1/7 spoke with [name] patient's POC discussed plan of care answered all questions status is: Inpatient    Dispo: The patient is from: Home  Anticipated d/c is to: SNF  Anticipated d/c date is: 3 days  Patient currently is not medically stable to d/c.     Consultants:  Neurology ENT Dr Elenore Rota     Procedures/Significant Events:  1/3 Echocardiogram;LVEF= 55 to 60%.  -Grade I diastolic dysfunction (impaired relaxation).  1/7 Closed reduction of mandibular dislocation bilaterally  I have personally reviewed and interpreted all radiology studies and my findings are as above.  VENTILATOR SETTINGS: Nasal cannula 1/9 Flow 2 L/min SPO2 98%   Cultures   Antimicrobials: Anti-infectives (From admission, onward)   Start     Ordered Stop  05/22/20 1000  remdesivir 100 mg in sodium chloride 0.9 % 100 mL IVPB        05/21/20 1347     05/20/20 1600  remdesivir 100 mg in sodium chloride 0.9 % 100 mL IVPB  Status:  Discontinued        05/20/20 1442 05/21/20 1346   05/18/20 1000   remdesivir 100 mg in sodium chloride 0.9 % 100 mL IVPB       "Followed by" Linked Group Details   05/18/20 0014 05/22/20 0959   05/18/20 0015  remdesivir 200 mg in sodium chloride 0.9% 250 mL IVPB       "Followed by" Linked Group Details   05/18/20 0014 05/18/20 0232   05/17/20 2300  cefTRIAXone (ROCEPHIN) 1 g in sodium chloride 0.9 % 100 mL IVPB        05/17/20 2249 05/18/20 0022   05/17/20 2300  azithromycin (ZITHROMAX) 500 mg in sodium chloride 0.9 % 250 mL IVPB        05/17/20 2249 05/18/20 0235       Devices    LINES / TUBES:      Continuous Infusions: . sodium chloride 50 mL/hr at 05/25/20 1910     Objective: Vitals:   05/27/20 0301 05/27/20 0745 05/27/20 1216 05/27/20 1600  BP: (!) 168/76 (!) 158/81 140/67 133/73  Pulse: (!) 45 (!) 51 94 61  Resp: 18 14 16 16   Temp: 98.6 F (37 C) 97.6 F (36.4 C) 97.6 F (36.4 C) 98.1 F (36.7 C)  TempSrc:  Oral Oral Oral  SpO2: 98% 96% 100% 96%  Weight:      Height:       No intake or output data in the 24 hours ending 05/27/20 1715 Filed Weights   05/17/20 2141  Weight: 65.8 kg    Examination:  General: A/O x4, No acute respiratory distress Eyes: negative scleral hemorrhage, negative anisocoria, negative icterus ENT: Negative Runny nose, negative gingival bleeding,  Neck:  Negative scars, masses, torticollis, lymphadenopathy, JVD Lungs: Clear to auscultation bilaterally without wheezes or crackles Cardiovascular: Regular rate and rhythm without murmur gallop or rub normal S1 and S2 Abdomen: negative abdominal pain, nondistended, positive soft, bowel sounds, no rebound, no ascites, no appreciable mass Extremities: No significant cyanosis, clubbing, or edema bilateral lower extremities Skin: Negative rashes, lesions, ulcers Psychiatric:  Negative depression, negative anxiety, negative fatigue, negative mania  Central nervous system:  Cranial nerves II through XII intact, tongue/uvula midline, all extremities muscle  strength 5/5, sensation intact throughout, finger nose finger bilateral past-pointing, quick finger touch bilateral difficult to accomplish, positive dysarthria, negative expressive aphasia, negative receptive aphasia.  .     Data Reviewed: Care during the described time interval was provided by me .  I have reviewed this patient's available data, including medical history, events of note, physical examination, and all test results as part of my evaluation.  CBC: Recent Labs  Lab 05/23/20 1001 05/24/20 0556 05/25/20 0537 05/26/20 0627 05/27/20 0443  WBC 7.7 6.9 7.3 7.7 8.0  NEUTROABS 4.9 4.8 4.8 5.0 5.4  HGB 12.8 13.2 12.4 11.4* 11.8*  HCT 39.0 39.5 36.5 34.6* 35.3*  MCV 85.0 85.3 85.5 86.1 85.1  PLT 303 337 293 273 259   Basic Metabolic Panel: Recent Labs  Lab 05/23/20 0333 05/23/20 1001 05/24/20 0556 05/25/20 0537 05/26/20 0627 05/27/20 0443  NA 139  --  138 139 138 137  K 4.0  --  4.2 4.0 4.1 4.2  CL 104  --  103 108 107 105  CO2 26  --  26 26 24 25   GLUCOSE 94  --  100* 99 100* 88  BUN 38*  --  36* 33* 29* 26*  CREATININE 1.13*  --  1.08* 1.01* 0.99 0.85  CALCIUM 9.8  --  10.4* 10.1 9.7 10.0  MG  --  2.6* 2.6* 2.3 2.2 2.1  PHOS  --  3.5 4.0 2.8 3.1 2.9   GFR: Estimated Creatinine Clearance: 70.4 mL/min (by C-G formula based on SCr of 0.85 mg/dL). Liver Function Tests: Recent Labs  Lab 05/23/20 0333 05/24/20 0556 05/25/20 0537 05/26/20 0627 05/27/20 0443  AST 14* 15 13* 11* 9*  ALT 12 13 14 14 11   ALKPHOS 51 59 48 47 44  BILITOT 0.7 0.9 0.8 0.7 0.8  PROT 6.2* 6.4* 5.8* 5.4* 5.3*  ALBUMIN 2.9* 3.2* 2.9* 2.7* 2.7*   No results for input(s): LIPASE, AMYLASE in the last 168 hours. No results for input(s): AMMONIA in the last 168 hours. Coagulation Profile: No results for input(s): INR, PROTIME in the last 168 hours. Cardiac Enzymes: No results for input(s): CKTOTAL, CKMB, CKMBINDEX, TROPONINI in the last 168 hours. BNP (last 3 results) No results for  input(s): PROBNP in the last 8760 hours. HbA1C: No results for input(s): HGBA1C in the last 72 hours. CBG: Recent Labs  Lab 05/26/20 1553 05/26/20 2123 05/27/20 0737 05/27/20 1214 05/27/20 1641  GLUCAP 135* 121* 89 93 128*   Lipid Profile: No results for input(s): CHOL, HDL, LDLCALC, TRIG, CHOLHDL, LDLDIRECT in the last 72 hours. Thyroid Function Tests: No results for input(s): TSH, T4TOTAL, FREET4, T3FREE, THYROIDAB in the last 72 hours. Anemia Panel: Recent Labs    05/26/20 0627 05/27/20 0443  FERRITIN 391* 353*   Sepsis Labs: No results for input(s): PROCALCITON, LATICACIDVEN in the last 168 hours.  Recent Results (from the past 240 hour(s))  Resp Panel by RT-PCR (Flu A&B, Covid) Nasopharyngeal Swab     Status: Abnormal   Collection Time: 05/17/20 10:31 PM   Specimen: Nasopharyngeal Swab; Nasopharyngeal(NP) swabs in vial transport medium  Result Value Ref Range Status   SARS Coronavirus 2 by RT PCR POSITIVE (A) NEGATIVE Final    Comment: RESULT CALLED TO, READ BACK BY AND VERIFIED WITH: jenna elington @2350  on 05/17/20 skl (NOTE) SARS-CoV-2 target nucleic acids are DETECTED.  The SARS-CoV-2 RNA is generally detectable in upper respiratory specimens during the acute phase of infection. Positive results are indicative of the presence of the identified virus, but do not rule out bacterial infection or co-infection with other pathogens not detected by the test. Clinical correlation with patient history and other diagnostic information is necessary to determine patient infection status. The expected result is Negative.  Fact Sheet for Patients: BloggerCourse.comhttps://www.fda.gov/media/152166/download  Fact Sheet for Healthcare Providers: SeriousBroker.ithttps://www.fda.gov/media/152162/download  This test is not yet approved or cleared by the [place] FDA and  has been authorized for detection and/or diagnosis of SARS-CoV-2 by FDA under an Emergency Use Authorization (EUA).  This EUA  will remain in effect (meaning this test can  be used) for the duration of  the COVID-19 declaration under Section 564(b)(1) of the Act, 21 U.S.C. section 360bbb-3(b)(1), unless the authorization is terminated or revoked sooner.     Influenza A by PCR NEGATIVE NEGATIVE Final   Influenza B by PCR NEGATIVE NEGATIVE Final    Comment: (NOTE) The Xpert Xpress SARS-CoV-2/FLU/RSV plus assay is intended as an aid in the diagnosis of influenza from Nasopharyngeal swab specimens and  should not be used as a sole basis for treatment. Nasal washings and aspirates are unacceptable for Xpert Xpress SARS-CoV-2/FLU/RSV testing.  Fact Sheet for Patients: BloggerCourse.com  Fact Sheet for Healthcare Providers: SeriousBroker.it  This test is not yet approved or cleared by the [place] FDA and has been authorized for detection and/or diagnosis of SARS-CoV-2 by FDA under an Emergency Use Authorization (EUA). This EUA will remain in effect (meaning this test can be used) for the duration of the COVID-19 declaration under Section 564(b)(1) of the Act, 21 U.S.C. section 360bbb-3(b)(1), unless the authorization is terminated or revoked.  Performed at [facility], [address] [place], [place] [address]   Blood culture (routine x 2)     Status: None   Collection Time: 05/17/20 10:50 PM   Specimen: BLOOD  Result Value Ref Range Status   Specimen Description BLOOD BLOOD RIGHT FOREARM  Final   Special Requests   Final    BOTTLES DRAWN AEROBIC AND ANAEROBIC Blood Culture results may not be optimal due to an excessive volume of blood received in culture bottles   Culture   Final    NO GROWTH 5 DAYS Performed at [facility], [address] [place], [place] [address]    Report Status 05/22/2020 FINAL  Final  Blood culture (routine x 2)     Status: None   Collection Time: 05/17/20 10:55 PM   Specimen: BLOOD  Result Value Ref  Range Status   Specimen Description BLOOD BLOOD LEFT FOREARM  Final   Special Requests   Final    BOTTLES DRAWN AEROBIC AND ANAEROBIC Blood Culture adequate volume   Culture   Final    NO GROWTH 5 DAYS Performed at [facility], [address], [place], [place] [address]    Report Status 05/22/2020 FINAL  Final         Radiology Studies: No results found.      Scheduled Meds: . amLODipine  10 mg Oral Daily  . vitamin C  500 mg Oral Daily  . aspirin EC  81 mg Oral Daily  . cloNIDine  0.1 mg Oral BID  . clopidogrel  75 mg Oral Daily  . dexamethasone  6 mg Oral Daily  . hydrALAZINE  15 mg Oral Q8H  . influenza vac split quadrivalent PF  0.5 mL Intramuscular Tomorrow-1000  . insulin aspart  0-15 Units Subcutaneous TID WC  . insulin detemir  0.075 Units/kg Subcutaneous BID  . Ipratropium-Albuterol  1 puff Inhalation Q6H  . senna  1 tablet Oral BID  . zinc sulfate  220 mg Oral Daily   Continuous Infusions: . sodium chloride 50 mL/hr at 05/25/20 1910     LOS: 9 days    Time spent:40 min    Catherine Cubero, Roselind Messier, MD [facility] Pager [phone]  If 7PM-7AM, please contact night-coverage [email or web address] Password [facility] 05/27/2020, 5:15 PM

## 2020-05-28 DIAGNOSIS — R531 Weakness: Secondary | ICD-10-CM

## 2020-05-28 DIAGNOSIS — I10 Essential (primary) hypertension: Secondary | ICD-10-CM

## 2020-05-28 LAB — CBC WITH DIFFERENTIAL/PLATELET
Abs Immature Granulocytes: 0.3 10*3/uL — ABNORMAL HIGH (ref 0.00–0.07)
Basophils Absolute: 0 10*3/uL (ref 0.0–0.1)
Basophils Relative: 0 %
Eosinophils Absolute: 0 10*3/uL (ref 0.0–0.5)
Eosinophils Relative: 0 %
HCT: 33.8 % — ABNORMAL LOW (ref 36.0–46.0)
Hemoglobin: 11.4 g/dL — ABNORMAL LOW (ref 12.0–15.0)
Immature Granulocytes: 4 %
Lymphocytes Relative: 12 %
Lymphs Abs: 0.9 10*3/uL (ref 0.7–4.0)
MCH: 28.7 pg (ref 26.0–34.0)
MCHC: 33.7 g/dL (ref 30.0–36.0)
MCV: 85.1 fL (ref 80.0–100.0)
Monocytes Absolute: 0.8 10*3/uL (ref 0.1–1.0)
Monocytes Relative: 11 %
Neutro Abs: 5.4 10*3/uL (ref 1.7–7.7)
Neutrophils Relative %: 73 %
Platelets: 237 10*3/uL (ref 150–400)
RBC: 3.97 MIL/uL (ref 3.87–5.11)
RDW: 13.1 % (ref 11.5–15.5)
WBC: 7.4 10*3/uL (ref 4.0–10.5)
nRBC: 0 % (ref 0.0–0.2)

## 2020-05-28 LAB — COMPREHENSIVE METABOLIC PANEL
ALT: 11 U/L (ref 0–44)
AST: 12 U/L — ABNORMAL LOW (ref 15–41)
Albumin: 2.6 g/dL — ABNORMAL LOW (ref 3.5–5.0)
Alkaline Phosphatase: 48 U/L (ref 38–126)
Anion gap: 7 (ref 5–15)
BUN: 24 mg/dL — ABNORMAL HIGH (ref 8–23)
CO2: 26 mmol/L (ref 22–32)
Calcium: 9.9 mg/dL (ref 8.9–10.3)
Chloride: 102 mmol/L (ref 98–111)
Creatinine, Ser: 0.97 mg/dL (ref 0.44–1.00)
GFR, Estimated: >60 mL/min (ref >60)
Glucose, Bld: 142 mg/dL — ABNORMAL HIGH (ref 70–99)
Potassium: 4 mmol/L (ref 3.5–5.1)
Sodium: 135 mmol/L (ref 135–145)
Total Bilirubin: 0.7 mg/dL (ref 0.3–1.2)
Total Protein: 5.1 g/dL — ABNORMAL LOW (ref 6.5–8.1)

## 2020-05-28 LAB — HEMOGLOBIN A1C
Hgb A1c MFr Bld: 6 % — ABNORMAL HIGH (ref 4.8–5.6)
Mean Plasma Glucose: 125.5 mg/dL

## 2020-05-28 LAB — PHOSPHORUS: Phosphorus: 3 mg/dL (ref 2.5–4.6)

## 2020-05-28 LAB — LACTATE DEHYDROGENASE: LDH: 124 U/L (ref 98–192)

## 2020-05-28 LAB — GLUCOSE, CAPILLARY
Glucose-Capillary: 111 mg/dL — ABNORMAL HIGH (ref 70–99)
Glucose-Capillary: 117 mg/dL — ABNORMAL HIGH (ref 70–99)
Glucose-Capillary: 146 mg/dL — ABNORMAL HIGH (ref 70–99)

## 2020-05-28 LAB — FERRITIN: Ferritin: 306 ng/mL (ref 11–307)

## 2020-05-28 LAB — D-DIMER, QUANTITATIVE: D-Dimer, Quant: 1.3 ug/mL-FEU — ABNORMAL HIGH (ref 0.00–0.50)

## 2020-05-28 LAB — MAGNESIUM: Magnesium: 2 mg/dL (ref 1.7–2.4)

## 2020-05-28 LAB — C-REACTIVE PROTEIN: CRP: <0.5 mg/dL (ref <1.0)

## 2020-05-28 MED ORDER — LISINOPRIL 10 MG PO TABS
5.0000 mg | ORAL_TABLET | Freq: Every day | ORAL | Status: DC
  Administered 2020-05-28 – 2020-05-29 (×2): 5 mg via ORAL
  Filled 2020-05-28 (×2): qty 1

## 2020-05-28 MED ORDER — HYDRALAZINE HCL 25 MG PO TABS
25.0000 mg | ORAL_TABLET | Freq: Two times a day (BID) | ORAL | Status: DC
Start: 2020-05-28 — End: 2020-05-29
  Administered 2020-05-28 – 2020-05-29 (×2): 25 mg via ORAL
  Filled 2020-05-28 (×2): qty 1

## 2020-05-28 NOTE — Progress Notes (Addendum)
Inpatient Rehabilitation Admissions Coordinator  Per my consult  on 05/23/2020:  Inpatient rehab consult received. Noted COVID + 05/17/2020. Patients are eligible to be considered for admit to the [facility] when cleared from airborne precautions by acute MD or Infectious disease. Otherwise they will need to be >20 days from their positive test with recovery/improvement in symptoms ( 1/22) or 2 negative tests. Please call me with any questions. I will follow. I have alerted SW.  Noted only bed level therapy yesterday. She can be considered for Cir once above met, but she will also have to demonstrate the abilities/tolerance to participate in the intensity required of an inpt rehab admit. I will follow her progress daily. Please call me with any questions. Other rehab options need to be considered.  Danne Baxter, RN, MSN Rehab Admissions Coordinator [phone] 05/28/2020 12:01 PM

## 2020-05-28 NOTE — Progress Notes (Signed)
Physical Therapy Treatment Patient Details Name: Stephanie [name] MRN: [id number] DOB: February 17, 1956 Today's Date: 05/28/2020    History of Present Illness 65 y.o. female with no significant medical history except for hysterectomy. She reports that she has felt ill for about 3 weeks. Initially she had diarrhea which did clear. However, she has had progressive weakness to the point where it is difficult to manage ADLs and she has had progressive SOB with a non-productive cough. Due to her symptoms she presents to [facility] for evaluation.    PT Comments    Pt resting in bed upon PT arrival; pt agreeable to PT session with some encouragement.  SBA with bed mobility and CGA with transfers and ambulation a few feet bed to recliner with RW.  Overall pt steady with mobility but requiring intermittent cueing for tasks.  Pt's HR noted to decrease from 67 bpm at rest beginning of session to 47 bpm after sitting down in recliner (pt asymptomatic) and pt's HR increased a little to 50-53 bpm at rest in recliner.  Deferred further mobility d/t concerning HR response to activity (pt's nurse and MD Allena Katz notified of HR concerns).  Will continue to focus on strengthening and progressive functional mobility.    Follow Up Recommendations  SNF     Equipment Recommendations  Rolling walker with 5" wheels;3in1 (PT)    Recommendations for Other Services       Precautions / Restrictions Precautions Precautions: Fall Restrictions Weight Bearing Restrictions: No    Mobility  Bed Mobility Overal bed mobility: Needs Assistance       Supine to sit: Supervision;HOB elevated     General bed mobility comments: mild increased effort to perform on own  Transfers Overall transfer level: Needs assistance Equipment used: Rolling walker (2 wheeled) Transfers: Sit to/from Stand Sit to Stand: Min guard         General transfer comment: fairly strong stand up to RW and controlled descent  sitting  Ambulation/Gait Ambulation/Gait assistance: Min guard Gait Distance (Feet): 3 Feet (bed to recliner) Assistive device: Rolling walker (2 wheeled)   Gait velocity: decreased   General Gait Details: decreased B LE step length/foot clearance/heelstrike; steady with RW use   Stairs             Wheelchair Mobility    Modified Rankin (Stroke Patients Only)       Balance Overall balance assessment: Needs assistance Sitting-balance support: No upper extremity supported;Feet supported Sitting balance-Leahy Scale: Good Sitting balance - Comments: steady sitting reaching within BOS   Standing balance support: No upper extremity supported Standing balance-Leahy Scale: Fair Standing balance comment: steady static standing                            Cognition Arousal/Alertness: Awake/alert Behavior During Therapy: Flat affect Overall Cognitive Status: No family/caregiver present to determine baseline cognitive functioning Area of Impairment: Safety/judgement;Following commands;Problem solving                 Orientation Level: Disoriented to;Time;Situation     Following Commands: Follows one step commands with increased time Safety/Judgement: Decreased awareness of safety   Problem Solving: Slow processing        Exercises      General Comments Pt agreeable to PT session.      Pertinent Vitals/Pain Pain Assessment: No/denies pain Pain Intervention(s): Limited activity within patient's tolerance;Monitored during session;Repositioned  O2 sats WFL on 2 L O2 via nasal cannula during  sessions activities.    Home Living                      Prior Function            PT Goals (current goals can now be found in the care plan section) Acute Rehab PT Goals Patient Stated Goal: return to PLOF working at [employer] PT Goal Formulation: With patient Time For Goal Achievement: 06/03/20 Potential to Achieve Goals:  Fair Progress towards PT goals: Progressing toward goals    Frequency    Min 2X/week      PT Plan Current plan remains appropriate    Co-evaluation              AM-PAC PT "6 Clicks" Mobility   Outcome Measure  Help needed turning from your back to your side while in a flat bed without using bedrails?: None Help needed moving from lying on your back to sitting on the side of a flat bed without using bedrails?: A Little Help needed moving to and from a bed to a chair (including a wheelchair)?: A Little Help needed standing up from a chair using your arms (e.g., wheelchair or bedside chair)?: A Little Help needed to walk in hospital room?: A Little Help needed climbing 3-5 steps with a railing? : A Lot 6 Click Score: 18    End of Session Equipment Utilized During Treatment: Gait belt;Oxygen (2 L O2 via nasal cannula) Activity Tolerance: Treatment limited secondary to medical complications (Comment) (HR decreased with activity (nurse and MD notified)) Patient left: in chair;with call bell/phone within reach;with chair alarm set Nurse Communication: Mobility status;Precautions;Other (comment) (pt's HR decreased with activity) PT Visit Diagnosis: Unsteadiness on feet (R26.81);Other abnormalities of gait and mobility (R26.89);Muscle weakness (generalized) (M62.81);History of falling (Z91.81);Difficulty in walking, not elsewhere classified (R26.2);Pain     Time: 1205-1221 PT Time Calculation (min) (ACUTE ONLY): 16 min  Charges:  $Therapeutic Activity: 8-22 mins                    Hendricks Limes, PT 05/28/20, 1:46 PM

## 2020-05-28 NOTE — Progress Notes (Signed)
[facility]  - [place] [facility]   PATIENT NAME: Stephanie [name]    MR#:  [id number]  DATE OF BIRTH:  June 02, 1955  SUBJECTIVE:  patient resting widely. No issues per RN patient working slowly with PT and OT.  REVIEW OF SYSTEMS:   Review of Systems  Constitutional: Negative for chills, fever and weight loss.  HENT: Negative for ear discharge, ear pain and nosebleeds.   Eyes: Negative for blurred vision, pain and discharge.  Respiratory: Negative for sputum production, shortness of breath, wheezing and stridor.   Cardiovascular: Negative for chest pain, palpitations, orthopnea and PND.  Gastrointestinal: Negative for abdominal pain, diarrhea, nausea and vomiting.  Genitourinary: Negative for frequency and urgency.  Musculoskeletal: Negative for back pain and joint pain.  Neurological: Positive for weakness. Negative for sensory change, speech change and focal weakness.       Mild dysarthria  Psychiatric/Behavioral: Negative for depression and hallucinations. The patient is not nervous/anxious.    Tolerating Diet:yes Tolerating PT: recomends rehab  DRUG ALLERGIES:  No Known Allergies  VITALS:  Blood pressure 130/77, pulse (!) 50, temperature (!) 97.4 F (36.3 C), temperature source Oral, resp. rate 16, height 6' (1.829 m), weight 65.8 kg, SpO2 95 %.  PHYSICAL EXAMINATION:   Physical Exam  GENERAL:  65 y.o.-year-old patient lying in the bed with no acute distress.  HEENT: Head atraumatic, normocephalic. Oropharynx and nasopharynx clear.  NECK:  Supple, no jugular venous distention. No thyroid enlargement, no tenderness.  LUNGS: Normal breath sounds bilaterally, no wheezing, rales, rhonchi. No use of accessory muscles of respiration.  CARDIOVASCULAR: S1, S2 normal. No murmurs, rubs, or gallops.  ABDOMEN: Soft, nontender, nondistended. Bowel sounds present. No organomegaly or mass.  EXTREMITIES: No cyanosis, clubbing or edema b/l.    NEUROLOGIC: mild  dysarthria,  Left Upper and LE mild weakness PSYCHIATRIC:  patient is alert and oriented x 2.  SKIN: No obvious rash, lesion, or ulcer.   LABORATORY PANEL:  CBC Recent Labs  Lab 05/28/20 0502  WBC 7.4  HGB 11.4*  HCT 33.8*  PLT 237    Chemistries  Recent Labs  Lab 05/28/20 0502  NA 135  K 4.0  CL 102  CO2 26  GLUCOSE 142*  BUN 24*  CREATININE 0.97  CALCIUM 9.9  MG 2.0  AST 12*  ALT 11  ALKPHOS 48  BILITOT 0.7   Cardiac Enzymes No results for input(s): TROPONINI in the last 168 hours. RADIOLOGY:  No results found. ASSESSMENT AND PLAN:   65 y.o.BF PMHx Hysterectomy.She reports that she has felt ill for about 3 weeks. Initially she had diarrhea which did clear. However, she has had progressive weakness to the point where it is difficult to manage ADLs and she has had progressive SOB with a non-productive cough  Acute respiratory failure with hypoxia/Covid 19 pneumonia COVID-19 Labs -pt's inflammatory markers are stable. -Satss 95 to hundred percent on 2 L. Wean to room air.        Lab Results  Component Value Date   SARSCOV2NAA POSITIVE (A) 05/17/2020    -Multilobe pneumonia secondary to COVID-19. -Remdesivir per pharmacy protocol -Decadron 6 mg daily -Vitamins per Covid protocol  Acute on chronic Diastolic CHF  -Ruled in BNP 428.0 -Strict in and out -3.4 L -Amlodipine 10 mg daily -Hydralazine 25 mg bid -Hydralazine PRN -add lisinopril 5 mg qd -d/c clonidine  Essential HTN -See above  Acute ischemic stroke -Right ICA aneurysm. -Patient is seen by neurology. Currently on aspirin and Plavix. -  Patient will need follow-up on ICA aneurysm as outpatient. -Patient will also need follow-up with cardiology as outpatient for loop recorder. -PT/OT recommended SNF  Acute kidney injury Secondary to Covid. Renal function has improved. Recent Labs       Lab Results  Component Value Date   CREATININE 0.85 05/27/2020   CREATININE 0.99  05/26/2020   CREATININE 1.01 (H) 05/25/2020   CREATININE 1.08 (H) 05/24/2020   CREATININE 1.13 (H) 05/23/2020    -Resolved  Anterior dislocation of the mandibular condyles -1/7 s/pClosed reduction of mandibular dislocation bilaterally -1/8 patient feeling significantly better.  Goals of care - PT/OT; recommend SNF     DVT prophylaxis: Lovenox Code Status: FULL Family Communication: none today status is: Inpatient    Dispo: The patient is from:Home Anticipated d/c is to:SNF vs home with [facility] Anticipated d/c date is: ~3 days Patient currently is not medically stable to d/c. 05/1220--per TOC  Per CIR note will need to be >20 days from their positive test with recovery/improvement in symptoms (1/22) or 2 negative tests.  Per TOC conversation with family the family will be able to provide 24/7 supervision and  if patient cane get to and use the bathroom or bedside commode independently she can return home with aunt.     TOTAL TIME TAKING CARE OF THIS PATIENT: *25* minutes.  >50% time spent on counselling and coordination of care  Note: This dictation was prepared with Dragon dictation along with smaller phrase technology. Any transcriptional errors that result from this process are unintentional.  [name] M.D    [facility]   CC: Primary care physician; Pcp, NoPatient ID: Stephanie [name], female   DOB: 11-23-1955, 65 y.o.   MRN: [id number]

## 2020-05-28 NOTE — TOC Progression Note (Signed)
Transition of Care [facility]) - Progression Note    Patient Details  Name: Stephanie [name] MRN: [id number] Date of Birth: [birth year]  [facility] CM/SW Contact  Chapman Fitch, RN Phone Number: 05/28/2020, 10:01 AM  Clinical Narrative:    Per CIR note will need to be >20 days from their positive test with recovery/improvement in symptoms ( 1/22) or 2 negative tests.  Per TOC conversation with family the family will be able to provide 24/7 supervision and  if patient cane get to and use the bathroom or bedside commode independently she can return home with aunt.    Per OT note yesterday patient was only able to do bed mobility    Expected Discharge Plan: Home w Home Health Services Barriers to Discharge: Continued Medical Work up  Expected Discharge Plan and Services Expected Discharge Plan: Home w Home Health Services     Post Acute Care Choice: Home Health Living arrangements for the past 2 months: Apartment                                       Social Determinants of Health (SDOH) Interventions    Readmission Risk Interventions No flowsheet data found.

## 2020-05-29 ENCOUNTER — Other Ambulatory Visit: Payer: Self-pay | Admitting: Internal Medicine

## 2020-05-29 LAB — MAGNESIUM: Magnesium: 2.2 mg/dL (ref 1.7–2.4)

## 2020-05-29 LAB — FIBRIN DERIVATIVES D-DIMER (ARMC ONLY): Fibrin derivatives D-dimer (ARMC): 1094.18 ng/mL (FEU) — ABNORMAL HIGH (ref 0.00–499.00)

## 2020-05-29 MED ORDER — CLOPIDOGREL BISULFATE 75 MG PO TABS: 75.0000 mg | ORAL_TABLET | Freq: Every day | ORAL | 2 refills | Status: DC

## 2020-05-29 MED ORDER — IPRATROPIUM-ALBUTEROL 20-100 MCG/ACT IN AERS: 1.0000 | INHALATION_SPRAY | Freq: Four times a day (QID) | RESPIRATORY_TRACT | 0 refills | Status: DC

## 2020-05-29 MED ORDER — AMLODIPINE BESYLATE 10 MG PO TABS: 10.0000 mg | ORAL_TABLET | Freq: Every day | ORAL | 2 refills | Status: DC

## 2020-05-29 MED ORDER — ATORVASTATIN CALCIUM 40 MG PO TABS: 40.0000 mg | ORAL_TABLET | Freq: Every day | ORAL | 3 refills | Status: DC

## 2020-05-29 MED ORDER — LISINOPRIL 5 MG PO TABS: 5.0000 mg | ORAL_TABLET | Freq: Every day | ORAL | 2 refills | Status: DC

## 2020-05-29 MED ORDER — ASPIRIN 81 MG PO TBEC: 81.0000 mg | DELAYED_RELEASE_TABLET | Freq: Every day | ORAL | 11 refills | Status: DC

## 2020-05-29 MED ORDER — HYDRALAZINE HCL 25 MG PO TABS: 25.0000 mg | ORAL_TABLET | Freq: Two times a day (BID) | ORAL | 2 refills | Status: DC

## 2020-05-29 MED ORDER — ATORVASTATIN CALCIUM 20 MG PO TABS: 40.0000 mg | ORAL_TABLET | Freq: Every day | ORAL | Status: DC

## 2020-05-29 NOTE — TOC Progression Note (Signed)
Transition of Care [facility]) - Progression Note    Patient Details  Name: Stephanie [name] MRN: [id number] Date of Birth: 04-Oct-1955  Transition of Care [facility]) CM/SW Contact  Chapman Fitch, RN Phone Number: 05/29/2020, 9:01 AM  Clinical Narrative:    MD has requested that therapy services work with patient daily   Expected Discharge Plan: Home w Home Health Services Barriers to Discharge: Continued Medical Work up  Expected Discharge Plan and Services Expected Discharge Plan: Home w Home Health Services     Post Acute Care Choice: Home Health Living arrangements for the past 2 months: Apartment                                       Social Determinants of Health (SDOH) Interventions    Readmission Risk Interventions No flowsheet data found.

## 2020-05-29 NOTE — Progress Notes (Signed)
Occupational Therapy Treatment Patient Details Name: Stephanie [name] MRN: [id number] DOB: 1955-11-16 Today's Date: 05/29/2020    History of present illness 65 y.o. female with no significant medical history except for hysterectomy. She reports that she has felt ill for about 3 weeks. Initially she had diarrhea which did clear. However, she has had progressive weakness to the point where it is difficult to manage ADLs and she has had progressive SOB with a non-productive cough. Due to her symptoms she presents to [facility] for evaluation.   OT comments  [name] seen for OT treatment on this date. Upon arrival to room pt awake reclined in bed, agreeable to toilet transfer training as pt expresses desire to return home. MD in during session to observe pt progress and ok to remove O2 for mobility as pt shows no increased WOB on RA.   Pt requires SBA for toilet t/f and single UE support for standing grooming tasks. INDEPENDENT don/doff B socks seated EOB. Pt return demonstrated seated exercises from prior sessions. Pt making good progress toward goals. Pt continues to benefit from skilled OT services to maximize return to PLOF and minimize risk of future falls, injury, caregiver burden, and readmission. Will continue to follow POC. Discharge recommendation updated to reflect pt improvement.    Follow Up Recommendations  Home health OT;Supervision/Assistance - 24 hour    Equipment Recommendations  3 in 1 bedside commode    Recommendations for Other Services      Precautions / Restrictions Precautions Precautions: Fall Restrictions Weight Bearing Restrictions: No       Mobility Bed Mobility Overal bed mobility: Modified Independent                Transfers Overall transfer level: Needs assistance Equipment used: None Transfers: Sit to/from Stand Sit to Stand: Supervision              Balance Overall balance assessment: Needs assistance Sitting-balance support: No upper  extremity supported;Feet supported Sitting balance-Leahy Scale: Good     Standing balance support: Single extremity supported;During functional activity Standing balance-Leahy Scale: Fair Standing balance comment: steady static standing                           ADL either performed or assessed with clinical judgement   ADL Overall ADL's : Needs assistance/impaired                                       General ADL Comments: SBA for toilet t/f and single UE support for standing grooming tasks. INDEPENDENT don/doff B socks seated EOB               Cognition Arousal/Alertness: Awake/alert Behavior During Therapy: Flat affect Overall Cognitive Status: No family/caregiver present to determine baseline cognitive functioning Area of Impairment: Problem solving;Safety/judgement                         Safety/Judgement: Decreased awareness of safety   Problem Solving: Slow processing General Comments: MIN multimodal cueing t/o        Exercises Exercises: Other exercises Other Exercises Other Exercises: Pt educated re: OT role, d/c recs, HEP, DME recs, importance of mobility for functional strengthening Other Exercises: LBD, toilet t/f, sup<>sit, sit<>stand, sitting/standing balance/toelrance           Pertinent Vitals/ Pain  Pain Assessment: No/denies pain         Frequency  Min 2X/week        Progress Toward Goals  OT Goals(current goals can now be found in the care plan section)  Progress towards OT goals: Progressing toward goals  Acute Rehab OT Goals Patient Stated Goal: return to PLOF working at [employer] OT Goal Formulation: With patient Time For Goal Achievement: 06/03/20 Potential to Achieve Goals: Good ADL Goals Pt Will Perform Grooming: with modified independence;standing Pt Will Transfer to Toilet: ambulating;with supervision Pt Will Perform Toileting - Clothing Manipulation and hygiene: with  supervision;sit to/from stand  Plan Discharge plan needs to be updated;Frequency remains appropriate       AM-PAC OT "6 Clicks" Daily Activity     Outcome Measure   Help from another person eating meals?: A Little Help from another person taking care of personal grooming?: A Little Help from another person toileting, which includes using toliet, bedpan, or urinal?: A Little Help from another person bathing (including washing, rinsing, drying)?: A Little Help from another person to put on and taking off regular upper body clothing?: A Little Help from another person to put on and taking off regular lower body clothing?: A Little 6 Click Score: 18    End of Session    OT Visit Diagnosis: Unsteadiness on feet (R26.81);Repeated falls (R29.6);Muscle weakness (generalized) (M62.81)   Activity Tolerance Patient tolerated treatment well   Patient Left in bed;with call bell/phone within reach;with bed alarm set   Nurse Communication Mobility status        Time: 1610-9604 OT Time Calculation (min): 13 min  Charges: OT General Charges $OT Visit: 1 Visit OT Treatments $Self Care/Home Management : 8-22 mins  Kathie Dike, M.S. OTR/L  05/29/20, 12:20 PM  ascom [phone]

## 2020-05-29 NOTE — Plan of Care (Signed)
Pt Axox4. Calm and cooperative and able to voice her needs. Vitals stable. On 2L . Pt continues to take her pills whole in applesauce. Uneventful night. Safety measures in place. Will continue to monitor. Problem: Education: Goal: Knowledge of General Education information will improve Description: Including pain rating scale, medication(s)/side effects and non-pharmacologic comfort measures Outcome: Progressing   Problem: Health Behavior/Discharge Planning: Goal: Ability to manage health-related needs will improve Outcome: Progressing   Problem: Clinical Measurements: Goal: Ability to maintain clinical measurements within normal limits will improve Outcome: Progressing Goal: Will remain free from infection Outcome: Progressing Goal: Diagnostic test results will improve Outcome: Progressing Goal: Respiratory complications will improve Outcome: Progressing Goal: Cardiovascular complication will be avoided Outcome: Progressing   Problem: Activity: Goal: Risk for activity intolerance will decrease Outcome: Progressing   Problem: Nutrition: Goal: Adequate nutrition will be maintained Outcome: Progressing   Problem: Coping: Goal: Level of anxiety will decrease Outcome: Progressing   Problem: Elimination: Goal: Will not experience complications related to bowel motility Outcome: Progressing Goal: Will not experience complications related to urinary retention Outcome: Progressing   Problem: Pain Managment: Goal: General experience of comfort will improve Outcome: Progressing   Problem: Safety: Goal: Ability to remain free from injury will improve Outcome: Progressing   Problem: Skin Integrity: Goal: Risk for impaired skin integrity will decrease Outcome: Progressing

## 2020-05-29 NOTE — TOC Transition Note (Addendum)
Transition of Care [facility]) - CM/SW Discharge Note   Patient Details  Name: Stephanie [name] MRN: [id number] Date of Birth: 1956/02/07  Transition of Care [facility]) CM/SW Contact:  Chapman Fitch, RN Phone Number: 05/29/2020, 1:02 PM   Clinical Narrative:     Patient has worked with PT and OT today, improving with mobility and aunt in agreement to take her home today.  BSC and RW delivered to room prior to discharge by [employer] [facility]. Per MD patient does not need O2 for discharge Patient and family agreeable to home health services.  Charity Referral made to [facility] with [place].  Notified him that patient would be discharging to [address] [place].  New patient appointment made for PCP on 2/9.  Patient and aunt notified what information to bring to appointment.  Also added to AVS. Dr Tory Emerald physician advisor to sign home health orders in the interim.   Medications have been sent to Medication Management  TOC coordinating deliver of medications prior to patient discharge.   Cousin to pick up at discharge   Update.  Medications to be pick up from Medication [facility]  At 230 and delivered to patient.  After that bedside RN to call cousin to transport patient   Final next level of care: Home w Home Health Services Barriers to Discharge: No Barriers Identified   Patient Goals and CMS Choice        Discharge Placement                       Discharge Plan and Services     Post Acute Care Choice: Home Health          DME Arranged: 3-N-1,[facility] Agency: [employer] Date DME Agency Contacted: 05/29/20   Representative spoke with at DME Agency: [facility] HH Arranged: PT HH Agency: [facility] Date [facility] Agency Contacted: 05/29/20   Representative spoke with at [facility] Agency: Kandee Keen  Social Determinants of Health (SDOH) Interventions     Readmission Risk Interventions No flowsheet data found.

## 2020-05-29 NOTE — Discharge Summary (Addendum)
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 Diabetes mellitus without complication [facility])     HOSPITAL COURSE:   weeks. Initially she had diarrhea which did clear. However, she has had progressive weakness to the point where it is difficult to manage ADLs and she has had progressive SOB with a non-productive cough  Acute respiratory failure with hypoxia/Covid 19 pneumonia COVID-19 Labs -pt's inflammatory markers are stable. -Sats 95 to hundred percent on 2 L. Weaned to room air. --no respiratory distress       Lab Results  Component Value Date   SARSCOV2NAA POSITIVE (A) 05/17/2020    -Multilobe pneumonia secondary to COVID-19. -Remdesivirper pharmacy protocol--complted -Decadron 6 mg daily--completed  Acute on chronic Diastolic CHF --euvolemic -Ruled in BNP 428.0 -Amlodipine 10 mg daily -Hydralazine 25 mg bid -add lisinopril 5 mg qd -d/c clonidine  Essential HTN -See above  Acute ischemic stroke -Right ICA aneurysm. -Patient is seen by neurology. Currently on aspirin and Plavix. -lipitor - follow-up on ICA aneurysm as outpatient--will defer to PCP at [facility] clinic do referral f/u neurology  -PT/OT  recommended SN--pt did well with PT and OT where family can manage and help her. [facility] PT, walker and 3-in 1 provided  Acute kidney injury--resolved Secondary to Covid. Renal function has improved.   Anterior dislocation of the mandibular condyles -1/7s/pClosed reduction of mandibular dislocation bilaterally -1/8 patient feeling significantly better.     DVT prophylaxis:Lovenox Code Status:FULL Family Communication:aunt [place] today  05/29/2020 status is: Inpatient    Dispo: The patient is from:Home Anticipated d/c is to: home with [facility] Anticipated d/c date is: today Patient currently is Improving and overall stable to d/c. 05/28/20--per TOC  Per CIR note will need to be >20 days from their positive test with recovery/improvement in symptoms (1/22) or 2 negative tests.  Per TOC conversation with family the family will be able to provide 24/7 supervision and if patient cane get to and use the bathroom or bedside commode independently she can return home with aunt.   05/29/2020--did very well with PT and OT--spoke with aunt and pt will discharge to home today.  CONSULTS OBTAINED:    DRUG ALLERGIES:  No Known Allergies  DISCHARGE MEDICATIONS:   Allergies as of 05/29/2020   No Known Allergies     Medication List    TAKE these medications   amLODipine 10 MG tablet Commonly known as: NORVASC Take 1 tablet (10 mg total) by mouth daily. Start taking on: May 30, 2020   aspirin 81 MG EC tablet Take 1 tablet (81 mg total) by mouth daily. Swallow whole. Start taking on: May 30, 2020   atorvastatin 40 MG tablet Commonly known as: LIPITOR Take 1 tablet (40 mg total) by mouth daily. Start taking on: May 30, 2020   clopidogrel 75 MG  tablet Commonly known as: PLAVIX Take 1 tablet (75 mg total) by mouth daily. Start taking on: May 30, 2020   hydrALAZINE 25 MG tablet Commonly known as:  APRESOLINE Take 1 tablet (25 mg total) by mouth 2 (two) times daily.   Ipratropium-Albuterol 20-100 MCG/ACT Aers respimat Commonly known as: COMBIVENT Inhale 1 puff into the lungs every 6 (six) hours.   lisinopril 5 MG tablet Commonly known as: ZESTRIL Take 1 tablet (5 mg total) by mouth daily. Start taking on: May 30, 2020            Durable Medical Equipment  (From admission, onward)         Start     Ordered   05/29/20 1202  DME 3-in-1  Once        05/29/20 1201   05/29/20 1202  DME Walker  Once       Question Answer Comment  Walker: With 5 Inch Wheels   Patient needs a walker to treat with the following condition Acute CVA (cerebrovascular accident) (HCC)      05/29/20 1201   05/29/20 1142  For home use only DME Walker rolling  Once       Question Answer Comment  Walker: With 5 Inch Wheels   Patient needs a walker to treat with the following condition Weakness      05/29/20 1142   05/29/20 1142  For home use only DME 3 n 1  Once        05/29/20 1142          If you experience worsening of your admission symptoms, develop shortness of breath, life threatening emergency, suicidal or homicidal thoughts you must seek medical attention immediately by calling 911 or calling your MD immediately  if symptoms less severe.  You Must read complete instructions/literature along with all the possible adverse reactions/side effects for all the Medicines you take and that have been prescribed to you. Take any new Medicines after you have completely understood and accept all the possible adverse reactions/side effects.   Please note  You were cared for by a hospitalist during your hospital stay. If you have any questions about your discharge medications or the care you received while you were in the hospital after you are discharged, you can call the unit and asked to speak with the hospitalist on call if the hospitalist that took care of you is not available. Once you are  discharged, your primary care physician will handle any further medical issues. Please note that NO REFILLS for any discharge medications will be authorized once you are discharged, as it is imperative that you return to your primary care physician (or establish a relationship with a primary care physician if you do not have one) for your aftercare needs so that they can reassess your need for medications and monitor your lab values. Today   SUBJECTIVE    Doing well. In the BR washing up with OT. No sob VITAL SIGNS:  Blood pressure 122/61, pulse (!) 45, temperature 97.8 F (36.6 C), temperature source Oral, resp. rate 20, height 6' (1.829 m), weight 65.8 kg, SpO2 99 %.  I/O:    Intake/Output Summary (Last 24 hours) at 05/29/2020 1203 Last data filed at 05/29/2020 0600 Gross per 24 hour  Intake 360 ml  Output 0 ml  Net 360 ml    PHYSICAL EXAMINATION:  GENERAL:  65 y.o.-year-old patient lying in the bed with no acute distress.  LUNGS: Normal breath sounds bilaterally,  no wheezing, rales,rhonchi or crepitation. No use of accessory muscles of respiration.  CARDIOVASCULAR: S1, S2 normal. No murmurs, rubs, or gallops.  ABDOMEN: Soft, non-tender, non-distended. Bowel sounds present. No organomegaly or mass.  EXTREMITIES: No pedal edema, cyanosis, or clubbing.  NEUROLOGIC: Cranial nerves II through XII are intact. Muscle strength 4+/5 in all extremities. Sensation intact. Gait not checked. Mild dysarthria PSYCHIATRIC: The patient is alert and oriented x 3.  SKIN: No obvious rash, lesion, or ulcer.   DATA REVIEW:   CBC  Recent Labs  Lab 05/28/20 0502  WBC 7.4  HGB 11.4*  HCT 33.8*  PLT 237    Chemistries  Recent Labs  Lab 05/28/20 0502 05/29/20 0237  NA 135  --   K 4.0  --   CL 102  --   CO2 26  --   GLUCOSE 142*  --   BUN 24*  --   CREATININE 0.97  --   CALCIUM 9.9  --   MG 2.0 2.2  AST 12*  --   ALT 11  --   ALKPHOS 48  --   BILITOT 0.7  --     Microbiology  Results   No results found for this or any previous visit (from the past 240 hour(s)).  RADIOLOGY:  No results found.   CODE STATUS:     Code Status Orders  (From admission, onward)         Start     Ordered   05/18/20 0009  Full code  Continuous        05/18/20 0014        Code Status History    This patient has a current code status but no historical code status.   Advance Care Planning Activity       TOTAL TIME TAKING CARE OF THIS PATIENT: *35* minutes.    [name] M.D  [facility]    CC: Primary care physician; Pcp, No

## 2020-06-01 ENCOUNTER — Emergency Department: Payer: HRSA Program

## 2020-06-01 ENCOUNTER — Inpatient Hospital Stay
Admission: EM | Admit: 2020-06-01 | Discharge: 2020-06-17 | DRG: 178 | Disposition: A | Discharge status: Home / Self Care | Payer: HRSA Program | Attending: Internal Medicine | Admitting: Internal Medicine

## 2020-06-01 ENCOUNTER — Other Ambulatory Visit: Payer: Self-pay

## 2020-06-01 DIAGNOSIS — K626 Ulcer of anus and rectum: Secondary | ICD-10-CM | POA: Diagnosis present

## 2020-06-01 DIAGNOSIS — I110 Hypertensive heart disease with heart failure: Secondary | ICD-10-CM | POA: Diagnosis present

## 2020-06-01 DIAGNOSIS — N179 Acute kidney failure, unspecified: Secondary | ICD-10-CM | POA: Diagnosis not present

## 2020-06-01 DIAGNOSIS — R1312 Dysphagia, oropharyngeal phase: Secondary | ICD-10-CM | POA: Diagnosis present

## 2020-06-01 DIAGNOSIS — Z9181 History of falling: Secondary | ICD-10-CM

## 2020-06-01 DIAGNOSIS — R634 Abnormal weight loss: Secondary | ICD-10-CM | POA: Diagnosis present

## 2020-06-01 DIAGNOSIS — E119 Type 2 diabetes mellitus without complications: Secondary | ICD-10-CM | POA: Diagnosis present

## 2020-06-01 DIAGNOSIS — E785 Hyperlipidemia, unspecified: Secondary | ICD-10-CM | POA: Diagnosis present

## 2020-06-01 DIAGNOSIS — Z7902 Long term (current) use of antithrombotics/antiplatelets: Secondary | ICD-10-CM

## 2020-06-01 DIAGNOSIS — D649 Anemia, unspecified: Secondary | ICD-10-CM | POA: Diagnosis not present

## 2020-06-01 DIAGNOSIS — Z681 Body mass index (BMI) 19 or less, adult: Secondary | ICD-10-CM

## 2020-06-01 DIAGNOSIS — K219 Gastro-esophageal reflux disease without esophagitis: Secondary | ICD-10-CM | POA: Diagnosis present

## 2020-06-01 DIAGNOSIS — U071 COVID-19: Principal | ICD-10-CM | POA: Diagnosis present

## 2020-06-01 DIAGNOSIS — R112 Nausea with vomiting, unspecified: Secondary | ICD-10-CM | POA: Diagnosis not present

## 2020-06-01 DIAGNOSIS — K5900 Constipation, unspecified: Secondary | ICD-10-CM | POA: Diagnosis present

## 2020-06-01 DIAGNOSIS — Z597 Insufficient social insurance and welfare support: Secondary | ICD-10-CM

## 2020-06-01 DIAGNOSIS — Z79899 Other long term (current) drug therapy: Secondary | ICD-10-CM

## 2020-06-01 DIAGNOSIS — D519 Vitamin B12 deficiency anemia, unspecified: Secondary | ICD-10-CM | POA: Diagnosis present

## 2020-06-01 DIAGNOSIS — K922 Gastrointestinal hemorrhage, unspecified: Secondary | ICD-10-CM

## 2020-06-01 DIAGNOSIS — E860 Dehydration: Secondary | ICD-10-CM | POA: Diagnosis present

## 2020-06-01 DIAGNOSIS — F17210 Nicotine dependence, cigarettes, uncomplicated: Secondary | ICD-10-CM | POA: Diagnosis present

## 2020-06-01 DIAGNOSIS — Z8719 Personal history of other diseases of the digestive system: Secondary | ICD-10-CM

## 2020-06-01 DIAGNOSIS — M2449 Recurrent dislocation, other specified joint: Secondary | ICD-10-CM | POA: Diagnosis present

## 2020-06-01 DIAGNOSIS — I5032 Chronic diastolic (congestive) heart failure: Secondary | ICD-10-CM | POA: Diagnosis present

## 2020-06-01 DIAGNOSIS — I959 Hypotension, unspecified: Secondary | ICD-10-CM | POA: Diagnosis present

## 2020-06-01 DIAGNOSIS — Z90710 Acquired absence of both cervix and uterus: Secondary | ICD-10-CM

## 2020-06-01 DIAGNOSIS — D500 Iron deficiency anemia secondary to blood loss (chronic): Secondary | ICD-10-CM | POA: Diagnosis present

## 2020-06-01 DIAGNOSIS — K635 Polyp of colon: Secondary | ICD-10-CM | POA: Diagnosis present

## 2020-06-01 DIAGNOSIS — E538 Deficiency of other specified B group vitamins: Secondary | ICD-10-CM | POA: Diagnosis present

## 2020-06-01 DIAGNOSIS — A0839 Other viral enteritis: Secondary | ICD-10-CM | POA: Diagnosis present

## 2020-06-01 DIAGNOSIS — I10 Essential (primary) hypertension: Secondary | ICD-10-CM | POA: Diagnosis present

## 2020-06-01 DIAGNOSIS — E559 Vitamin D deficiency, unspecified: Secondary | ICD-10-CM | POA: Diagnosis present

## 2020-06-01 DIAGNOSIS — Z8673 Personal history of transient ischemic attack (TIA), and cerebral infarction without residual deficits: Secondary | ICD-10-CM

## 2020-06-01 DIAGNOSIS — Z7982 Long term (current) use of aspirin: Secondary | ICD-10-CM

## 2020-06-01 DIAGNOSIS — E876 Hypokalemia: Secondary | ICD-10-CM | POA: Diagnosis present

## 2020-06-01 HISTORY — DX: Cerebral infarction, unspecified: I63.9

## 2020-06-01 LAB — BASIC METABOLIC PANEL
Anion gap: 11 (ref 5–15)
BUN: 62 mg/dL — ABNORMAL HIGH (ref 8–23)
CO2: 23 mmol/L (ref 22–32)
Calcium: 10 mg/dL (ref 8.9–10.3)
Chloride: 108 mmol/L (ref 98–111)
Creatinine, Ser: 1.4 mg/dL — ABNORMAL HIGH (ref 0.44–1.00)
GFR, Estimated: 42 mL/min — ABNORMAL LOW (ref >60)
Glucose, Bld: 145 mg/dL — ABNORMAL HIGH (ref 70–99)
Potassium: 3.9 mmol/L (ref 3.5–5.1)
Sodium: 142 mmol/L (ref 135–145)

## 2020-06-01 LAB — CBC
HCT: 28 % — ABNORMAL LOW (ref 36.0–46.0)
Hemoglobin: 9.3 g/dL — ABNORMAL LOW (ref 12.0–15.0)
MCH: 28.8 pg (ref 26.0–34.0)
MCHC: 33.2 g/dL (ref 30.0–36.0)
MCV: 86.7 fL (ref 80.0–100.0)
Platelets: 154 10*3/uL (ref 150–400)
RBC: 3.23 MIL/uL — ABNORMAL LOW (ref 3.87–5.11)
RDW: 13.7 % (ref 11.5–15.5)
WBC: 13.6 10*3/uL — ABNORMAL HIGH (ref 4.0–10.5)
nRBC: 0 % (ref 0.0–0.2)

## 2020-06-01 LAB — CBG MONITORING, ED: Glucose-Capillary: 125 mg/dL — ABNORMAL HIGH (ref 70–99)

## 2020-06-01 LAB — LACTIC ACID, PLASMA: Lactic Acid, Venous: 1.9 mmol/L (ref 0.5–1.9)

## 2020-06-01 MED ORDER — ONDANSETRON HCL 4 MG/2ML IJ SOLN
4.0000 mg | Freq: Four times a day (QID) | INTRAMUSCULAR | Status: DC | PRN
  Administered 2020-06-02: 4 mg via INTRAVENOUS
  Filled 2020-06-01: qty 2

## 2020-06-01 MED ORDER — AMLODIPINE BESYLATE 10 MG PO TABS
10.0000 mg | ORAL_TABLET | Freq: Every day | ORAL | Status: DC
Start: 2020-06-02 — End: 2020-06-17
  Administered 2020-06-04 – 2020-06-17 (×13): 10 mg via ORAL
  Filled 2020-06-01 (×14): qty 1
  Filled 2020-06-01: qty 2

## 2020-06-01 MED ORDER — ENOXAPARIN SODIUM 40 MG/0.4ML ~~LOC~~ SOLN
40.0000 mg | SUBCUTANEOUS | Status: DC
  Administered 2020-06-02 – 2020-06-04 (×2): 40 mg via SUBCUTANEOUS
  Filled 2020-06-01 (×2): qty 0.4

## 2020-06-01 MED ORDER — ONDANSETRON HCL 4 MG PO TABS: 4.0000 mg | ORAL_TABLET | Freq: Four times a day (QID) | ORAL | Status: DC | PRN

## 2020-06-01 MED ORDER — HYDRALAZINE HCL 25 MG PO TABS: 25.0000 mg | ORAL_TABLET | Freq: Three times a day (TID) | ORAL | Status: DC | PRN

## 2020-06-01 MED ORDER — LACTATED RINGERS IV BOLUS
1000.0000 mL | Freq: Once | INTRAVENOUS | Status: AC
  Administered 2020-06-01: 1000 mL via INTRAVENOUS

## 2020-06-01 MED ORDER — ACETAMINOPHEN 325 MG PO TABS
650.0000 mg | ORAL_TABLET | Freq: Four times a day (QID) | ORAL | Status: DC | PRN
  Filled 2020-06-01 (×2): qty 2

## 2020-06-01 MED ORDER — ASPIRIN EC 81 MG PO TBEC
81.0000 mg | DELAYED_RELEASE_TABLET | Freq: Every day | ORAL | Status: DC
  Administered 2020-06-04: 81 mg via ORAL
  Filled 2020-06-01 (×3): qty 1

## 2020-06-01 MED ORDER — IPRATROPIUM-ALBUTEROL 20-100 MCG/ACT IN AERS
1.0000 | INHALATION_SPRAY | Freq: Four times a day (QID) | RESPIRATORY_TRACT | Status: DC
  Administered 2020-06-02 – 2020-06-17 (×40): 1 via RESPIRATORY_TRACT
  Filled 2020-06-01 (×3): qty 4

## 2020-06-01 MED ORDER — ACETAMINOPHEN 650 MG RE SUPP: 325.0000 mg | Freq: Four times a day (QID) | RECTAL | Status: DC | PRN

## 2020-06-01 MED ORDER — LACTATED RINGERS IV SOLN: INTRAVENOUS | Status: DC

## 2020-06-01 MED ORDER — ATORVASTATIN CALCIUM 20 MG PO TABS
40.0000 mg | ORAL_TABLET | Freq: Every day | ORAL | Status: DC
Start: 2020-06-01 — End: 2020-06-17
  Administered 2020-06-04 – 2020-06-16 (×12): 40 mg via ORAL
  Filled 2020-06-01 (×14): qty 2

## 2020-06-01 MED ORDER — CLOPIDOGREL BISULFATE 75 MG PO TABS
75.0000 mg | ORAL_TABLET | Freq: Every day | ORAL | Status: DC
Start: 2020-06-02 — End: 2020-06-05
  Administered 2020-06-04: 75 mg via ORAL
  Filled 2020-06-01 (×2): qty 1

## 2020-06-01 MED ORDER — LACTATED RINGERS IV BOLUS: 1500.0000 mL | Freq: Once | INTRAVENOUS | Status: DC

## 2020-06-01 NOTE — ED Notes (Signed)
Patient to waiting room via wheelchair by EMS.  Per EMS patient recently discharged from hospital with GI bleed and COVID was discharged home in care of family.  Family has sent patient back because they state they can not care for the patient, she is vomiting and and have tarry stool.  EMS interventions -- p 67, bp 86/62, pulse oxi 95%, cbg 176, saline loc to left antecub via 20 g angiocath.

## 2020-06-01 NOTE — ED Notes (Signed)
Update provided to pt's family at this time.

## 2020-06-01 NOTE — ED Notes (Signed)
Pt called into room, had BM and urinated the bed. Pt clothes removed and pt cleaned up and new linens/chux to bed. New blankets applied at this time. No further c/o. States feels much better at this time.

## 2020-06-01 NOTE — H&P (Signed)
History and Physical   Stephanie [name] VFI:[id number] DOB: 09-27-1955 DOA: 06/01/2020  PCP: Pcp, No  Patient coming from: home  I have personally briefly reviewed patient's old medical records in [facility] EMR.  Chief Concern: Nausea and vomiting  HPI: Stephanie [name] is a 65 y.o. female with medical history significant for hypertension, COVID-19 infection has completed remdesivir and Decadron treatment, tobacco abuse, hyperlipidemia, hysterectomy, presented to the emergency department for chief concerns of intractable nausea and vomiting for 1 day.  She reports that was not vomiting for 1 day.  She reports that since discharge from the hospital on 05/29/2020 she has only been eating applesauce.  She tolerated applesauce well however on 05/31/2020 she developed nausea and vomiting persistently all day.  She reports that she has not been able to tolerate any p.o. intake.  She reports that the vomitus is clear.  She denies diarrhea, constipation.  She states she does not know when the last time she had a bowel movement because she has been vomiting all day yesterday.  She is unvaccinated for COVID-19, she states that she wants to be vaccinated now.  Social history: She lives at home with her aunt who is 55 years old.  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  She smokes 2 packs/day, denies alcohol use, denies recreational drug use.  ROS: Constitutional: no weight change, no fever ENT/Mouth: no sore throat, no rhinorrhea Eyes: no eye pain, no vision changes Cardiovascular: no chest pain, no dyspnea,  no edema, no palpitations Respiratory: no cough, no sputum, no wheezing Gastrointestinal: + nausea, + vomiting, no diarrhea, no constipation Genitourinary: no urinary incontinence, no dysuria, no hematuria Musculoskeletal: no arthralgias, no myalgias Skin: no skin lesions, no pruritus, Neuro: + weakness, no loss of consciousness, no syncope Psych: no anxiety, no depression, + decrease  appetite Heme/Lymph: no bruising, no bleeding  ED Course: Discussed with ED provider, patient require hospitalization due to intractable nausea and vomiting.  Initial vitals in the emergency department were reassuring with afebrile with temperature of 97.7, respiration rate 14-20, heart rate 66, initial blood pressure was 76/35 and improved to 110/70.  During my evaluation of patient at bedside she will maintain MAP of greater than 70.  ED provider ordered 1 L of lactated ringer bolus.  Assessment/Plan  Principal Problem:   Intractable vomiting with nausea Active Problems:   COVID-19   Dehydration   Primary hypertension   AKI (acute kidney injury) (HCC)   Gastroenteritis due to COVID-19 virus   Intractable nausea and vomiting-suspect secondary to COVID-19 gastroenteritis COVID long-haul - Supportive care including ondansetron and acetaminophen - Patient has appropriate MAP, LR IVF at 125 cc/h for 8 hours  Acute kidney injury suspect secondary to nausea and vomiting - Fluid IVF as above - BMP in the a.m.  Primary hypertension- patient is maintaining low normotensive - Holding antihypertensives tonight - Resume amlodipine tomorrow - Holding lisinopril and hydralazine scheduled - Hydralazine as needed for SBP greater than 170  Is history of acute ischemic stroke-right ICA aneurysm felt suspicious for acute inflammatory state of COVID-19 in the background of undiagnosed small vessel risk factors - Neurology was seen on previous hospitalization and recommended Plavix loading and continue 75 mg daily for 21-day course and aspirin daily if no indication for anticoagulation -Resumed home statins  Anterior dislocation of the mandibular condyles-present on admission - Patient states she fell 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 the toilet at work - Acetaminophen as needed for pain - Currently avoiding  opiates due to patient presenting with hypotension - No Toradol due to acute kidney  injury  Leukocytosis- suspect reactive due to demyelination from steroid   Chart reviewed.   Complete echo performed on 05/19/2020 was read as EF of 55 to 60%, grade 1 diastolic dysfunction.  DVT prophylaxis: Enoxaparin Code Status: Full code Diet: Cardiac Family Communication: No Disposition Plan: Pending clinical course Consults called: *None Admission status: Observation with telemetry  Past Medical History:  Diagnosis Date  . Diabetes mellitus without complication [facility])    Past Surgical History:  Procedure Laterality Date  . CLOSED REDUCTION MANDIBLE Bilateral 05/23/2020   Procedure: CLOSED REDUCTION MANDIBULAR;  Surgeon: Vernie Murders, MD;  Location: [facility] ORS;  Service: ENT;  Laterality: Bilateral;   Social History:  reports that she quit smoking about 6 weeks ago. She has never used smokeless tobacco. No history on file for alcohol use and drug use.  No Known Allergies No family history on file. Family history: Family history reviewed and not pertinent  Prior to Admission medications   Medication Sig Start Date End Date Taking? Authorizing Provider  amLODipine (NORVASC) 10 MG tablet Take 1 tablet (10 mg total) by mouth daily. 05/30/20   [name], MD  aspirin EC 81 MG EC tablet Take 1 tablet (81 mg total) by mouth daily. Swallow whole. 05/30/20   [name], MD  atorvastatin (LIPITOR) 40 MG tablet Take 1 tablet (40 mg total) by mouth daily. 05/30/20   [name], MD  clopidogrel (PLAVIX) 75 MG tablet Take 1 tablet (75 mg total) by mouth daily. 05/30/20   [name], MD  hydrALAZINE (APRESOLINE) 25 MG tablet Take 1 tablet (25 mg total) by mouth 2 (two) times daily. 05/29/20   [name], MD  Ipratropium-Albuterol (COMBIVENT) 20-100 MCG/ACT AERS respimat Inhale 1 puff into the lungs every 6 (six) hours. 05/29/20   [name], MD  lisinopril (ZESTRIL) 5 MG tablet Take 1 tablet (5 mg total) by mouth daily. 05/30/20   [name], MD   Physical Exam: Vitals:   06/01/20 1939  06/01/20 2005 06/01/20 2030 06/01/20 2055  BP: (!) 76/35 110/60 106/63   Pulse:    66  Resp:   13 17  Temp:      SpO2:    97%  Weight:      Height:       Constitutional: appears appropriate, NAD, calm, comfortable Eyes: PERRL, lids and conjunctivae normal ENMT: Mucous membranes are moist. Posterior pharynx clear of any exudate or lesions. Age-appropriate dentition. Hearing appropriate.  Anterior jaw dislocated present on admission Neck: normal, supple, no masses, no thyromegaly Respiratory: clear to auscultation bilaterally, no wheezing, no crackles. Normal respiratory effort. No accessory muscle use.  Cardiovascular: Regular rate and rhythm, no murmurs / rubs / gallops. No extremity edema. 2+ pedal pulses. No carotid bruits.  Abdomen: no tenderness, no masses palpated, no hepatosplenomegaly. Bowel sounds positive.  Musculoskeletal: no clubbing / cyanosis. No joint deformity upper and lower extremities. Good ROM, no contractures, no atrophy. Normal muscle tone.  Skin: no rashes, lesions, ulcers. No induration Neurologic: Sensation intact. Strength 5/5 in all 4.  Psychiatric: Normal judgment and insight. Alert and oriented x 3. Normal mood.   EKG: independently reviewed, showing sinus tachycardia with rate of 121 QTC 443 relatively unchanged from previous EKG  Chest x-ray on Admission: I personally reviewed and I agree with radiologist reading as below.  DG Chest Portable 1 View  Result Date: 06/01/2020 CLINICAL DATA:  Fatigue.  Had COVID  recently. EXAM: PORTABLE CHEST 1 VIEW COMPARISON:  Chest radiograph 05/19/2020 FINDINGS: The heart size and mediastinal contours are within normal limits. Slight improvement in ill-defined interstitial and airspace opacities in the right midlung and left lung base. No visible pleural effusions or pneumothorax. No acute osseous abnormality IMPRESSION: Slight improvement in ill-defined interstitial and airspace opacities in the right midlung and left lung  base, possibly representing improved pneumonia given the provided clinical history. Electronically Signed   By: Feliberto Harts MD   On: 06/01/2020 20:28   Labs on Admission: I have personally reviewed following labs  CBC: Recent Labs  Lab 05/26/20 0627 05/27/20 0443 05/28/20 0502 06/01/20 1939  WBC 7.7 8.0 7.4 13.6*  NEUTROABS 5.0 5.4 5.4  --   HGB 11.4* 11.8* 11.4* 9.3*  HCT 34.6* 35.3* 33.8* 28.0*  MCV 86.1 85.1 85.1 86.7  PLT 273 259 237 154   Basic Metabolic Panel: Recent Labs  Lab 05/26/20 0627 05/27/20 0443 05/28/20 0502 05/29/20 0237 06/01/20 1939  NA 138 137 135  --  142  K 4.1 4.2 4.0  --  3.9  CL 107 105 102  --  108  CO2 24 25 26   --  23  GLUCOSE 100* 88 142*  --  145*  BUN 29* 26* 24*  --  62*  CREATININE 0.99 0.85 0.97  --  1.40*  CALCIUM 9.7 10.0 9.9  --  10.0  MG 2.2 2.1 2.0 2.2  --   PHOS 3.1 2.9 3.0  --   --    Liver Function Tests: Recent Labs  Lab 05/26/20 0627 05/27/20 0443 05/28/20 0502  AST 11* 9* 12*  ALT 14 11 11   ALKPHOS 47 44 48  BILITOT 0.7 0.8 0.7  PROT 5.4* 5.3* 5.1*  ALBUMIN 2.7* 2.7* 2.6*   CBG: Recent Labs  Lab 05/27/20 2100 05/28/20 0754 05/28/20 1139 05/28/20 1656 06/01/20 1947  GLUCAP 105* 111* 117* 146* 125*   Urine analysis:    Component Value Date/Time   COLORURINE YELLOW (A) 05/18/2020 0632   APPEARANCEUR HAZY (A) 05/18/2020 0632   LABSPEC 1.015 05/18/2020 0632   PHURINE 5.0 05/18/2020 0632   GLUCOSEU NEGATIVE 05/18/2020 0632   HGBUR NEGATIVE 05/18/2020 0632   BILIRUBINUR NEGATIVE 05/18/2020 0632   KETONESUR NEGATIVE 05/18/2020 0632   PROTEINUR 30 (A) 05/18/2020 0632   NITRITE NEGATIVE 05/18/2020 0632   LEUKOCYTESUR NEGATIVE 05/18/2020 07/16/2020   Chloe Baig N Kaylanni Ezelle D.O. [facility]  If 7PM-7AM, please contact overnight-coverage provider If 7AM-7PM, please contact day coverage provider [email or web address]  06/01/2020, 10:15 PM

## 2020-06-01 NOTE — ED Triage Notes (Addendum)
BIB [facility] due to "COVID and tired" pt reports. When asking if she is here for anything else, pt denies.  Denies CP, SOB or any GI bleeding Pt appears fatigued, requesting to lay down.   EMS reported pt here due to recent discharge from hospital after GI bleed and family is having difficulties taking care of patient.

## 2020-06-01 NOTE — ED Provider Notes (Signed)
[facility] Emergency Department Provider Note   ____________________________________________   Event Date/Time   First MD Initiated Contact with Patient 06/01/20 2000     (approximate)  I have reviewed the triage vital signs and the nursing notes.   HISTORY  Chief Complaint Fatigue and Covid Positive    HPI Stephanie [name] is a 65 y.o. female with a history of diabetes, GI bleeding, previous stroke, COVID-19   Patient reports she left the hospital Sunday and since then has not hardly been able to eat or drink anything as she has no appetite.  No pain or discomfort anywhere.  She has been feeling very fatigued weak and lightheaded for the last several days.  Reports she feels very dehydrated and has not ate or drink almost anything.  Denies vomiting.  Denies bloody stools.  Denies being in any pain or discomfort.  Denies nausea just reports she has 0 appetite and has not eaten anything  No ongoing fever.  No ongoing cough.  Denies trouble breathing  Past Medical History:  Diagnosis Date  . Diabetes mellitus without complication [facility] [place])     Patient Active Problem List   Diagnosis Date Noted  . Weakness   . Primary hypertension   . Acute respiratory failure with hypoxia (HCC) 05/19/2020  . COVID-19 05/18/2020  . Dehydration 05/18/2020  . Slurred speech 05/18/2020  . Acute ischemic left ACA stroke (HCC) 05/18/2020    Past Surgical History:  Procedure Laterality Date  . CLOSED REDUCTION MANDIBLE Bilateral 05/23/2020   Procedure: CLOSED REDUCTION MANDIBULAR;  Surgeon: Vernie Murders, MD;  Location: [facility] ORS;  Service: ENT;  Laterality: Bilateral;    Prior to Admission medications   Medication Sig Start Date End Date Taking? Authorizing Provider  amLODipine (NORVASC) 10 MG tablet Take 1 tablet (10 mg total) by mouth daily. 05/30/20   [name], MD  aspirin EC 81 MG EC tablet Take 1 tablet (81 mg total) by mouth daily. Swallow whole. 05/30/20   [name], MD  atorvastatin (LIPITOR) 40 MG tablet Take 1 tablet (40 mg total) by mouth daily. 05/30/20   [name], MD  clopidogrel (PLAVIX) 75 MG tablet Take 1 tablet (75 mg total) by mouth daily. 05/30/20   [name], MD  hydrALAZINE (APRESOLINE) 25 MG tablet Take 1 tablet (25 mg total) by mouth 2 (two) times daily. 05/29/20   [name], MD  Ipratropium-Albuterol (COMBIVENT) 20-100 MCG/ACT AERS respimat Inhale 1 puff into the lungs every 6 (six) hours. 05/29/20   [name], MD  lisinopril (ZESTRIL) 5 MG tablet Take 1 tablet (5 mg total) by mouth daily. 05/30/20   [name], MD    Allergies Patient has no known allergies.  No family history on file.  Social History Social History   Tobacco Use  . Smoking status: Former Smoker    Quit date: 04/2020    Years since quitting: 0.1  . Smokeless tobacco: Never Used  Vaping Use  . Vaping Use: Never used    Review of Systems Constitutional: No fever/chills but recently had coronavirus Eyes: No visual changes. ENT: No sore throat.  Feels very thirsty and dry. Cardiovascular: Denies chest pain. Respiratory: Denies shortness of breath. Gastrointestinal: No abdominal pain.   Genitourinary: Negative for dysuria. Musculoskeletal: Negative for back pain. Skin: Negative for rash. Neurological: Negative for headaches, areas of focal weakness or numbness.    ____________________________________________   PHYSICAL EXAM:  VITAL SIGNS: ED Triage Vitals  Enc Vitals Group  BP 06/01/20 1930 (!) 70/35     Pulse Rate 06/01/20 1930 (!) 101     Resp 06/01/20 1930 14     Temp 06/01/20 1930 97.7 F (36.5 C)     Temp src --      SpO2 06/01/20 1930 98 %     Weight 06/01/20 1930 148 lb (67.1 kg)     Height 06/01/20 1930 6' (1.829 m)     Head Circumference --      Peak Flow --      Pain Score 06/01/20 1937 0     Pain Loc --      Pain Edu? --      Excl. in GC? --     Constitutional: Alert and oriented.  Mildly ill-appearing but in no  acute distress.  Laying in bed appears very fatigued Eyes: Conjunctivae are normal. Head: Atraumatic. Nose: No congestion/rhinnorhea. Mouth/Throat: Mucous membranes are very dry. Neck: No stridor.  Cardiovascular: Slightly tachycardic rate, regular rhythm. Grossly normal heart sounds.  Good peripheral circulation. Respiratory: Normal respiratory effort.  No retractions. Lungs CTAB. Gastrointestinal: Soft and nontender. No distention. Musculoskeletal: No lower extremity tenderness nor edema. Neurologic:  Normal speech and language. No gross focal neurologic deficits are appreciated.  Skin:  Skin is warm, dry and intact. No rash noted. Psychiatric: Mood and affect are normal. Speech and behavior are normal.  ____________________________________________   LABS (all labs ordered are listed, but only abnormal results are displayed)  Labs Reviewed  BASIC METABOLIC PANEL - Abnormal; Notable for the following components:      Result Value   Glucose, Bld 145 (*)    BUN 62 (*)    Creatinine, Ser 1.40 (*)    GFR, Estimated 42 (*)    All other components within normal limits  CBC - Abnormal; Notable for the following components:   WBC 13.6 (*)    RBC 3.23 (*)    Hemoglobin 9.3 (*)    HCT 28.0 (*)    All other components within normal limits  CBG MONITORING, ED - Abnormal; Notable for the following components:   Glucose-Capillary 125 (*)    All other components within normal limits  CULTURE, BLOOD (SINGLE)  LACTIC ACID, PLASMA  URINALYSIS, COMPLETE (UACMP) WITH MICROSCOPIC   ____________________________________________  EKG  Reviewed inter by me at 2020 Heart rate 70 QRS 99 QTc 440 Normal sinus rhythm, T wave inversions in multiple leads and T wave biphasic in V2.  Potentially indicative of diffuse ischemic picture or possible LVH or repolarization abnormality ____________________________________________  RADIOLOGY  DG Chest Portable 1 View  Result Date: 06/01/2020 CLINICAL  DATA:  Fatigue.  Had COVID recently. EXAM: PORTABLE CHEST 1 VIEW COMPARISON:  Chest radiograph 05/19/2020 FINDINGS: The heart size and mediastinal contours are within normal limits. Slight improvement in ill-defined interstitial and airspace opacities in the right midlung and left lung base. No visible pleural effusions or pneumothorax. No acute osseous abnormality IMPRESSION: Slight improvement in ill-defined interstitial and airspace opacities in the right midlung and left lung base, possibly representing improved pneumonia given the provided clinical history. Electronically Signed   By: Feliberto Harts MD   On: 06/01/2020 20:28     Chest x-ray reviewed, improvements in interstitialopacities ____________________________________________   PROCEDURES  Procedure(s) performed: None  Procedures  Critical Care performed: Yes, see critical care note(s)  CRITICAL CARE Performed by: Sharyn Creamer   Total critical care time: 25 minutes  Critical care time was exclusive of separately billable procedures and treating  other patients.  Critical care was necessary to treat or prevent imminent or life-threatening deterioration.  Critical care was time spent personally by me on the following activities: development of treatment plan with patient and/or surrogate as well as nursing, discussions with consultants, evaluation of patient's response to treatment, examination of patient, obtaining history from patient or surrogate, ordering and performing treatments and interventions, ordering and review of laboratory studies, ordering and review of radiographic studies, pulse oximetry and re-evaluation of patient's condition.  ____________________________________________   INITIAL IMPRESSION / ASSESSMENT AND PLAN / ED COURSE  Pertinent labs & imaging results that were available during my care of the patient were reviewed by me and considered in my medical decision making (see chart for details).    Presents for fatigue, reports not able to eat or drink anything since leaving the hospital Sunday.  She has generally reassuring abdominal exam, but she appears very fatigued and hypotensive and dehydrated.  Has a history of CHF but she certainly appears hypovolemic by examination today.  Check labs, provide hydration and fluids.  She does not express obvious ongoing infectious symptoms, and she denies ongoing fevers or respiratory issues after COVID.  However her tachycardia and hypotension are concerning we will also send lactic acid, check labs evaluate CBC etc.    Patient presents with severe hypotension, thankfully she is fluid responsive and after receiving 1 L of fluid her blood pressure has normalized.  Labs reveal AKI with elevated BUN likely prerenal in nature given the clinical history.  Given her presentation, severity of symptoms, and persistence of lack of appetite etc. which I suspect is likely related to her recent COVID we will admit to the hospital for further care and management to the hospitalist service.  Admission discussed with Dr. Sedalia Muta  ----------------------------------------- 9:36 PM on 06/01/2020 -----------------------------------------  Patient starting to feel improved, understanding and comfortable agreeable with plan to be admitted    ____________________________________________   FINAL CLINICAL IMPRESSION(S) / ED DIAGNOSES  Final diagnoses:  Dehydration  AKI (acute kidney injury) (HCC)  Hypotension, unspecified hypotension type        Note:  This document was prepared using Dragon voice recognition software and may include unintentional dictation errors       Sharyn Creamer, MD 06/01/20 2202

## 2020-06-02 DIAGNOSIS — K219 Gastro-esophageal reflux disease without esophagitis: Secondary | ICD-10-CM | POA: Diagnosis present

## 2020-06-02 DIAGNOSIS — I10 Essential (primary) hypertension: Secondary | ICD-10-CM | POA: Diagnosis not present

## 2020-06-02 DIAGNOSIS — Z8719 Personal history of other diseases of the digestive system: Secondary | ICD-10-CM | POA: Diagnosis not present

## 2020-06-02 DIAGNOSIS — I110 Hypertensive heart disease with heart failure: Secondary | ICD-10-CM | POA: Diagnosis present

## 2020-06-02 DIAGNOSIS — E785 Hyperlipidemia, unspecified: Secondary | ICD-10-CM | POA: Diagnosis present

## 2020-06-02 DIAGNOSIS — I959 Hypotension, unspecified: Secondary | ICD-10-CM | POA: Diagnosis present

## 2020-06-02 DIAGNOSIS — D500 Iron deficiency anemia secondary to blood loss (chronic): Secondary | ICD-10-CM | POA: Diagnosis present

## 2020-06-02 DIAGNOSIS — I5032 Chronic diastolic (congestive) heart failure: Secondary | ICD-10-CM | POA: Diagnosis present

## 2020-06-02 DIAGNOSIS — K635 Polyp of colon: Secondary | ICD-10-CM | POA: Diagnosis not present

## 2020-06-02 DIAGNOSIS — D638 Anemia in other chronic diseases classified elsewhere: Secondary | ICD-10-CM | POA: Diagnosis not present

## 2020-06-02 DIAGNOSIS — Z681 Body mass index (BMI) 19 or less, adult: Secondary | ICD-10-CM | POA: Diagnosis not present

## 2020-06-02 DIAGNOSIS — R1312 Dysphagia, oropharyngeal phase: Secondary | ICD-10-CM | POA: Diagnosis present

## 2020-06-02 DIAGNOSIS — R112 Nausea with vomiting, unspecified: Secondary | ICD-10-CM | POA: Diagnosis not present

## 2020-06-02 DIAGNOSIS — E119 Type 2 diabetes mellitus without complications: Secondary | ICD-10-CM | POA: Diagnosis present

## 2020-06-02 DIAGNOSIS — E538 Deficiency of other specified B group vitamins: Secondary | ICD-10-CM | POA: Diagnosis present

## 2020-06-02 DIAGNOSIS — D649 Anemia, unspecified: Secondary | ICD-10-CM | POA: Diagnosis not present

## 2020-06-02 DIAGNOSIS — N179 Acute kidney failure, unspecified: Secondary | ICD-10-CM | POA: Diagnosis present

## 2020-06-02 DIAGNOSIS — K5900 Constipation, unspecified: Secondary | ICD-10-CM | POA: Diagnosis present

## 2020-06-02 DIAGNOSIS — D519 Vitamin B12 deficiency anemia, unspecified: Secondary | ICD-10-CM | POA: Diagnosis not present

## 2020-06-02 DIAGNOSIS — E860 Dehydration: Secondary | ICD-10-CM | POA: Diagnosis not present

## 2020-06-02 DIAGNOSIS — K626 Ulcer of anus and rectum: Secondary | ICD-10-CM | POA: Diagnosis not present

## 2020-06-02 DIAGNOSIS — M2449 Recurrent dislocation, other specified joint: Secondary | ICD-10-CM | POA: Diagnosis present

## 2020-06-02 DIAGNOSIS — E559 Vitamin D deficiency, unspecified: Secondary | ICD-10-CM | POA: Diagnosis present

## 2020-06-02 DIAGNOSIS — E876 Hypokalemia: Secondary | ICD-10-CM | POA: Diagnosis not present

## 2020-06-02 DIAGNOSIS — F17210 Nicotine dependence, cigarettes, uncomplicated: Secondary | ICD-10-CM | POA: Diagnosis present

## 2020-06-02 DIAGNOSIS — U071 COVID-19: Secondary | ICD-10-CM | POA: Diagnosis present

## 2020-06-02 DIAGNOSIS — D529 Folate deficiency anemia, unspecified: Secondary | ICD-10-CM | POA: Diagnosis not present

## 2020-06-02 DIAGNOSIS — Z90710 Acquired absence of both cervix and uterus: Secondary | ICD-10-CM | POA: Diagnosis not present

## 2020-06-02 DIAGNOSIS — R531 Weakness: Secondary | ICD-10-CM | POA: Diagnosis not present

## 2020-06-02 DIAGNOSIS — A0839 Other viral enteritis: Secondary | ICD-10-CM | POA: Diagnosis present

## 2020-06-02 LAB — IRON AND TIBC
Iron: 54 ug/dL (ref 28–170)
Saturation Ratios: 30 % (ref 10.4–31.8)
TIBC: 178 ug/dL — ABNORMAL LOW (ref 250–450)
UIBC: 124 ug/dL

## 2020-06-02 LAB — CBC
HCT: 26.2 % — ABNORMAL LOW (ref 36.0–46.0)
Hemoglobin: 8.9 g/dL — ABNORMAL LOW (ref 12.0–15.0)
MCH: 29.3 pg (ref 26.0–34.0)
MCHC: 34 g/dL (ref 30.0–36.0)
MCV: 86.2 fL (ref 80.0–100.0)
Platelets: 158 10*3/uL (ref 150–400)
RBC: 3.04 MIL/uL — ABNORMAL LOW (ref 3.87–5.11)
RDW: 13.7 % (ref 11.5–15.5)
WBC: 13.6 10*3/uL — ABNORMAL HIGH (ref 4.0–10.5)
nRBC: 0 % (ref 0.0–0.2)

## 2020-06-02 LAB — RETICULOCYTES
Immature Retic Fract: 14 % (ref 2.3–15.9)
RBC.: 3.13 MIL/uL — ABNORMAL LOW (ref 3.87–5.11)
Retic Count, Absolute: 50.7 10*3/uL (ref 19.0–186.0)
Retic Ct Pct: 1.6 % (ref 0.4–3.1)

## 2020-06-02 LAB — VITAMIN B12: Vitamin B-12: 111 pg/mL — ABNORMAL LOW (ref 180–914)

## 2020-06-02 LAB — TSH: TSH: 2.507 u[IU]/mL (ref 0.350–4.500)

## 2020-06-02 LAB — BASIC METABOLIC PANEL
Anion gap: 7 (ref 5–15)
BUN: 55 mg/dL — ABNORMAL HIGH (ref 8–23)
CO2: 24 mmol/L (ref 22–32)
Calcium: 9.8 mg/dL (ref 8.9–10.3)
Chloride: 110 mmol/L (ref 98–111)
Creatinine, Ser: 1.19 mg/dL — ABNORMAL HIGH (ref 0.44–1.00)
GFR, Estimated: 51 mL/min — ABNORMAL LOW (ref >60)
Glucose, Bld: 115 mg/dL — ABNORMAL HIGH (ref 70–99)
Potassium: 4.5 mmol/L (ref 3.5–5.1)
Sodium: 141 mmol/L (ref 135–145)

## 2020-06-02 LAB — FERRITIN: Ferritin: 802 ng/mL — ABNORMAL HIGH (ref 11–307)

## 2020-06-02 LAB — FOLATE: Folate: 5.6 ng/mL — ABNORMAL LOW (ref >5.9)

## 2020-06-02 MED ORDER — SODIUM CHLORIDE 0.9 % IV SOLN: INTRAVENOUS | Status: AC

## 2020-06-02 NOTE — ED Notes (Signed)
Pt attempted PO intake and immediately vomited small amount of applesauce.

## 2020-06-02 NOTE — ED Notes (Signed)
Pt placed on bed pan. Will use call bell when finished

## 2020-06-02 NOTE — ED Notes (Signed)
Float nurse informed this RN that patient was unable to swallow medication. MD notified.

## 2020-06-02 NOTE — ED Notes (Signed)
This RN and Chief Executive Officer cleaned pt after incontinence episode. Clean linens and chux provided at this time.

## 2020-06-02 NOTE — ED Notes (Signed)
This RN attempted x2 for new IV placement without success.

## 2020-06-02 NOTE — ED Notes (Signed)
Candise Bowens RN attempted x2 for new IV, without success.

## 2020-06-02 NOTE — Progress Notes (Signed)
[facility]  - [place] at [facility]   PATIENT NAME: Stephanie [name]    MR#:  [id number]  DATE OF BIRTH:  07/02/1955  SUBJECTIVE:  patient was brought in by EMS since family was unable to take care of her. She was recently diagnosed with acute stroke G.I. bleed and COVID infection.  Patient also had vomiting yesterday. She did have some nausea. Unable to care for herself. ) Difficulty swallowing pills REVIEW OF SYSTEMS:   Review of Systems  Constitutional: Negative for chills, fever and weight loss.  HENT: Negative for ear discharge, ear pain and nosebleeds.   Eyes: Negative for blurred vision, pain and discharge.  Respiratory: Negative for sputum production, shortness of breath, wheezing and stridor.   Cardiovascular: Negative for chest pain, palpitations, orthopnea and PND.  Gastrointestinal: Negative for abdominal pain, diarrhea, nausea and vomiting.  Genitourinary: Negative for frequency and urgency.  Musculoskeletal: Negative for back pain and joint pain.  Neurological: Positive for weakness. Negative for sensory change, speech change and focal weakness.  Psychiatric/Behavioral: Negative for depression and hallucinations. The patient is not nervous/anxious.    Tolerating Diet:penidng speech eval Tolerating PT:   DRUG ALLERGIES:  No Known Allergies  VITALS:  Blood pressure 90/74, pulse (!) 115, temperature 98.7 F (37.1 C), temperature source Oral, resp. rate 15, height 6' (1.829 m), weight 67.1 kg, SpO2 98 %.  PHYSICAL EXAMINATION:   Physical Exam  GENERAL:  65 y.o.-year-old patient lying in the bed with no acute distress. Chronically ill HEENT: Head atraumatic, normocephalic. Oropharynx and nasopharynx clear. dysarthria NECK:  Supple, no jugular venous distention. No thyroid enlargement, no tenderness.  LUNGS: Normal breath sounds bilaterally, no wheezing, rales, rhonchi. No use of accessory muscles of respiration.  CARDIOVASCULAR: S1, S2 normal. No  murmurs, rubs, or gallops.  ABDOMEN: Soft, nontender, nondistended. Bowel sounds present. No organomegaly or mass.  EXTREMITIES: No cyanosis, clubbing or edema b/l.    NEUROLOGIC: dysarthria--chronic, left ue and LE weakness+ PSYCHIATRIC:  patient is alert and oriented x 2.  SKIN: No obvious rash, lesion, or ulcer.   LABORATORY PANEL:  CBC Recent Labs  Lab 06/02/20 0405  WBC 13.6*  HGB 8.9*  HCT 26.2*  PLT 158    Chemistries  Recent Labs  Lab 05/28/20 0502 05/29/20 0237 06/01/20 1939 06/02/20 0405  NA 135  --    < > 141  K 4.0  --    < > 4.5  CL 102  --    < > 110  CO2 26  --    < > 24  GLUCOSE 142*  --    < > 115*  BUN 24*  --    < > 55*  CREATININE 0.97  --    < > 1.19*  CALCIUM 9.9  --    < > 9.8  MG 2.0 2.2  --   --   AST 12*  --   --   --   ALT 11  --   --   --   ALKPHOS 48  --   --   --   BILITOT 0.7  --   --   --    < > = values in this interval not displayed.   Cardiac Enzymes No results for input(s): TROPONINI in the last 168 hours. RADIOLOGY:  DG Chest Portable 1 View  Result Date: 06/01/2020 CLINICAL DATA:  Fatigue.  Had COVID recently. EXAM: PORTABLE CHEST 1 VIEW COMPARISON:  Chest radiograph 05/19/2020 FINDINGS: The heart size  and mediastinal contours are within normal limits. Slight improvement in ill-defined interstitial and airspace opacities in the right midlung and left lung base. No visible pleural effusions or pneumothorax. No acute osseous abnormality IMPRESSION: Slight improvement in ill-defined interstitial and airspace opacities in the right midlung and left lung base, possibly representing improved pneumonia given the provided clinical history. Electronically Signed   By: Stephanie Harts MD   On: 06/01/2020 20:28   ASSESSMENT AND PLAN:   Stephanie [name] is a 65 y.o. female with medical history significant for hypertension, COVID-19 infection has completed remdesivir and Decadron treatment, tobacco abuse, hyperlipidemia, hysterectomy,  presented to the emergency department for chief concerns of intractable nausea and vomiting for 1 day.  Intractable nausea and vomiting-suspect secondary to COVID-19 gastroenteritis COVID long-haul - Supportive care including ondansetron and acetaminophen - Patient has appropriate MAP, received LR IVF at 125 cc/h for 8 hours --cont IV NS  Acute kidney injury suspect secondary to nausea and vomiting - Fluid IVF as above - baseline creat 0.9 -came in with creat 1.41---1.19  Primary hypertension- patient is maintaining low normotensive - Holding antihypertensives for now - Home BP meds are amlodipine, lisinopril and hydralazine - Hydralazine as needed for SBP greater than 170  history of acute ischemic stroke-right ICA aneurysm  - Neurology was seen on previous hospitalization and recommended Plavix loading  continue 75 mg daily for 21-day course and aspirin daily -Resumed home statins  Anterior dislocation of the mandibular condyles-present on admission - Patient states she fell 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 the toilet at work - Acetaminophen as needed for pain - Currently avoiding opiates due to patient presenting with hypotension - No Toradol due to acute kidney injury  Leukocytosis- suspect reactive due to demyelination from steroid   PT and ST to see TOC for d/c planning  DVT prophylaxis: Enoxaparin Code Status: Full code Diet: Cardiac Family Communication: No Disposition Plan: Pending clinical course Consults called: *None   Status is: inpatient Dispo: The patient is from: Home              Anticipated d/c is to: Home              Anticipated d/c date is: 2 days              Patient currently is not medically stable to d/c.        TOTAL TIME TAKING CARE OF THIS PATIENT: 25 minutes.  >50% time spent on counselling and coordination of care  Note: This dictation was prepared with Dragon dictation along with smaller phrase technology. Any transcriptional errors  that result from this process are unintentional.  Stephanie [name] M.D    [facility]   CC: Primary care physician; Pcp, NoPatient ID: Stephanie [name], female   DOB: 27-Dec-1955, 65 y.o.   MRN: [id number]

## 2020-06-03 ENCOUNTER — Inpatient Hospital Stay: Payer: HRSA Program | Admitting: Anesthesiology

## 2020-06-03 ENCOUNTER — Encounter: Payer: Self-pay | Admitting: Internal Medicine

## 2020-06-03 ENCOUNTER — Other Ambulatory Visit: Payer: Self-pay

## 2020-06-03 ENCOUNTER — Encounter
Admission: EM | Disposition: A | Discharge status: Home / Self Care | Payer: Self-pay | Source: Home / Self Care | Attending: Internal Medicine

## 2020-06-03 ENCOUNTER — Inpatient Hospital Stay: Payer: HRSA Program

## 2020-06-03 HISTORY — PX: CLOSED REDUCTION MANDIBLE: SHX5307

## 2020-06-03 SURGERY — CLOSED REDUCTION, MANDIBLE
Anesthesia: General

## 2020-06-03 MED ORDER — BISACODYL 5 MG PO TBEC
5.0000 mg | DELAYED_RELEASE_TABLET | Freq: Every day | ORAL | Status: DC | PRN
  Administered 2020-06-04 (×2): 5 mg via ORAL
  Filled 2020-06-03 (×3): qty 1

## 2020-06-03 MED ORDER — DOCUSATE SODIUM 100 MG PO CAPS
100.0000 mg | ORAL_CAPSULE | Freq: Two times a day (BID) | ORAL | Status: DC
  Administered 2020-06-04 – 2020-06-17 (×23): 100 mg via ORAL
  Filled 2020-06-03 (×26): qty 1

## 2020-06-03 MED ORDER — PROPOFOL 10 MG/ML IV BOLUS
INTRAVENOUS | Status: AC
  Filled 2020-06-03: qty 20

## 2020-06-03 MED ORDER — FENTANYL CITRATE (PF) 100 MCG/2ML IJ SOLN
25.0000 ug | INTRAMUSCULAR | Status: DC | PRN
Start: 2020-06-03 — End: 2020-06-03

## 2020-06-03 MED ORDER — PROPOFOL 10 MG/ML IV BOLUS
INTRAVENOUS | Status: DC | PRN
  Administered 2020-06-03: 70 mg via INTRAVENOUS

## 2020-06-03 MED ORDER — SODIUM CHLORIDE 0.9 % IV SOLN
INTRAVENOUS | Status: DC | PRN
Start: 2020-06-03 — End: 2020-06-03

## 2020-06-03 MED ORDER — ONDANSETRON HCL 4 MG/2ML IJ SOLN: 4.0000 mg | Freq: Once | INTRAMUSCULAR | Status: DC | PRN

## 2020-06-03 MED ORDER — SODIUM CHLORIDE 0.9 % IV SOLN: INTRAVENOUS | Status: DC

## 2020-06-03 SURGICAL SUPPLY — 1 items: GAUZE SPONGE 4X4 12PLY STRL (GAUZE/BANDAGES/DRESSINGS) ×2 IMPLANT

## 2020-06-03 NOTE — Transfer of Care (Signed)
Immediate Anesthesia Transfer of Care Note  Patient: Stephanie [name]  Procedure(s) Performed: CLOSED REDUCTION MANDIBULAR (N/A )  Patient Location: PACU  Anesthesia Type:MAC  Level of Consciousness: awake, alert  and patient cooperative  Airway & Oxygen Therapy: Patient Spontanous Breathing  Post-op Assessment: Report given to RN and Post -op Vital signs reviewed and stable  Post vital signs: Reviewed and stable  Last Vitals:  Vitals Value Taken Time  BP 110/68 06/03/20 1811  Temp    Pulse    Resp 15 06/03/20 1813  SpO2    Vitals shown include unvalidated device data.  Last Pain:  Vitals:   06/03/20 1000  TempSrc:   PainSc: 0-No pain         Complications: No complications documented.

## 2020-06-03 NOTE — Progress Notes (Addendum)
[facility]  - [place] at [facility]   PATIENT NAME: Stephanie [name]    MR#:  [id number]  DATE OF BIRTH:  1956/01/07  SUBJECTIVE:  patient was brought in by EMS since family was unable to take care of her. She was recently diagnosed with acute stroke G.I. bleed and COVID infection.  No vomiting today Tells me she is hungry. She was not able to care for herself and family not able to care for her REVIEW OF SYSTEMS:   Review of Systems  Constitutional: Negative for chills, fever and weight loss.  HENT: Negative for ear discharge, ear pain and nosebleeds.   Eyes: Negative for blurred vision, pain and discharge.  Respiratory: Negative for sputum production, shortness of breath, wheezing and stridor.   Cardiovascular: Negative for chest pain, palpitations, orthopnea and PND.  Gastrointestinal: Negative for abdominal pain, diarrhea, nausea and vomiting.  Genitourinary: Negative for frequency and urgency.  Musculoskeletal: Negative for back pain and joint pain.  Neurological: Positive for weakness. Negative for sensory change, speech change and focal weakness.  Psychiatric/Behavioral: Negative for depression and hallucinations. The patient is not nervous/anxious.    Tolerating Diet:pending speech eval Tolerating PT: pending  DRUG ALLERGIES:  No Known Allergies  VITALS:  Blood pressure 127/78, pulse 82, temperature 98.7 F (37.1 C), temperature source Oral, resp. rate 17, height 6' (1.829 m), weight 67.1 kg, SpO2 100 %.  PHYSICAL EXAMINATION:   Physical Exam  GENERAL:  65 y.o.-year-old patient lying in the bed with no acute distress. Chronically ill HEENT: Head atraumatic, normocephalic. Oropharynx and nasopharynx clear. dysarthria NECK:  Supple, no jugular venous distention. No thyroid enlargement, no tenderness.  LUNGS: Normal breath sounds bilaterally, no wheezing, rales, rhonchi. No use of accessory muscles of respiration.  CARDIOVASCULAR: S1, S2 normal. No  murmurs, rubs, or gallops.  ABDOMEN: Soft, nontender, nondistended. Bowel sounds present. No organomegaly or mass.  EXTREMITIES: No cyanosis, clubbing or edema b/l.    NEUROLOGIC: dysarthria--chronic, left ue and LE weakness+ PSYCHIATRIC:  patient is alert and oriented x 2.  SKIN: No obvious rash, lesion, or ulcer.   LABORATORY PANEL:  CBC Recent Labs  Lab 06/02/20 0405  WBC 13.6*  HGB 8.9*  HCT 26.2*  PLT 158    Chemistries  Recent Labs  Lab 05/28/20 0502 05/29/20 0237 06/01/20 1939 06/02/20 0405  NA 135  --    < > 141  K 4.0  --    < > 4.5  CL 102  --    < > 110  CO2 26  --    < > 24  GLUCOSE 142*  --    < > 115*  BUN 24*  --    < > 55*  CREATININE 0.97  --    < > 1.19*  CALCIUM 9.9  --    < > 9.8  MG 2.0 2.2  --   --   AST 12*  --   --   --   ALT 11  --   --   --   ALKPHOS 48  --   --   --   BILITOT 0.7  --   --   --    < > = values in this interval not displayed.   Cardiac Enzymes No results for input(s): TROPONINI in the last 168 hours. RADIOLOGY:  DG Chest Portable 1 View  Result Date: 06/01/2020 CLINICAL DATA:  Fatigue.  Had COVID recently. EXAM: PORTABLE CHEST 1 VIEW COMPARISON:  Chest radiograph 05/19/2020  FINDINGS: The heart size and mediastinal contours are within normal limits. Slight improvement in ill-defined interstitial and airspace opacities in the right midlung and left lung base. No visible pleural effusions or pneumothorax. No acute osseous abnormality IMPRESSION: Slight improvement in ill-defined interstitial and airspace opacities in the right midlung and left lung base, possibly representing improved pneumonia given the provided clinical history. Electronically Signed   By: Feliberto Harts MD   On: 06/01/2020 20:28   ASSESSMENT AND PLAN:   Stephanie [name] is a 65 y.o. female with medical history significant for hypertension, COVID-19 infection has completed remdesivir and Decadron treatment, tobacco abuse, hyperlipidemia, hysterectomy,  presented to the emergency department for chief concerns of intractable nausea and vomiting for 1 day.  Intractable nausea and vomiting-suspect secondary to COVID-19 gastroenteritis COVID long-haul - Supportive care including ondansetron and acetaminophen - Patient has appropriate MAP, received LR IVF at 125 cc/h for 8 hours --cont IV NS  Acute kidney injury suspect secondary to nausea and vomiting - Fluid IVF as above - baseline creat 0.9 -came in with creat 1.41---1.19  Primary hypertension- patient is maintaining low normotensive - Holding antihypertensives for now - Home BP meds are amlodipine, lisinopril and hydralazine - Hydralazine as needed for SBP greater than 170  history of acute ischemic stroke-right ICA aneurysm  - Neurology had seen on previous hospitalization and recommended Plavix  For 75 mg and ASA 81 mg daily for 21-days and then aspirin daily -Resumed home statins --ST and PT to see  Anterior dislocation of the mandibular condyles s/p Closed reduction of mandibular dislocation bilaterally on jan 7th - Acetaminophen as needed for pain - No Toradol due to acute kidney injury --pt unable to close her mouth properly. Having difficulty swallowing. ST input appreciated --consult ENT Dr Erline Hau for possible recurrence of mandible dislocation  Leukocytosis- suspect reactive   PT and ST to see TOC for d/c planning  DVT prophylaxis: Enoxaparin Code Status: Full code Diet: Cardiac Family Communication: None today Disposition Plan: Pending clinical course Consults called: *None   Status is: inpatient Dispo: The patient is from: Home              Anticipated d/c is to: Home              Anticipated d/c date is: 2 days              Patient currently is not medically stable to d/c. Unsafe d/c --per report family cannot care for her PT,ST pending      TOTAL TIME TAKING CARE OF THIS PATIENT: 25 minutes.  >50% time spent on counselling and  coordination of care  Note: This dictation was prepared with Dragon dictation along with smaller phrase technology. Any transcriptional errors that result from this process are unintentional.  [name] M.D    [facility]   CC: Primary care physician; Pcp, NoPatient ID: Daryll Drown, female   DOB: 01-03-1956, 65 y.o.   MRN: [id number]

## 2020-06-03 NOTE — Anesthesia Preprocedure Evaluation (Signed)
Anesthesia Evaluation  Patient identified by MRN, date of birth, ID band Patient awake    Reviewed: Allergy & Precautions, H&P , NPO status , Patient's Chart, lab work & pertinent test results  History of Anesthesia Complications Negative for: history of anesthetic complications  Airway Mallampati: III  TM Distance: <3 FB Neck ROM: limited    Dental  (+) Chipped   Pulmonary neg shortness of breath, pneumonia, former smoker,    + rhonchi  + decreased breath sounds      Cardiovascular Exercise Tolerance: Good hypertension, (-) angina(-) Past MI Normal cardiovascular exam     Neuro/Psych CVA, Residual Symptoms negative psych ROS   GI/Hepatic negative GI ROS, Neg liver ROS,   Endo/Other  diabetes, Type 2  Renal/GU Renal disease  negative genitourinary   Musculoskeletal   Abdominal   Peds  Hematology negative hematology ROS (+)   Anesthesia Other Findings COVID 19 +  Past Medical History: No date: Diabetes mellitus without complication (HCC)  History reviewed. No pertinent surgical history.  BMI    Body Mass Index: 19.67 kg/m      Reproductive/Obstetrics negative OB ROS                             Anesthesia Physical  Anesthesia Plan  ASA: III and emergent  Anesthesia Plan: General   Post-op Pain Management:    Induction: Intravenous  PONV Risk Score and Plan: Propofol infusion and TIVA  Airway Management Planned: Natural Airway and Nasal Cannula  Additional Equipment:   Intra-op Plan:   Post-operative Plan:   Informed Consent: I have reviewed the patients History and Physical, chart, labs and discussed the procedure including the risks, benefits and alternatives for the proposed anesthesia with the patient or authorized representative who has indicated his/her understanding and acceptance.     Dental Advisory Given  Plan Discussed with: Anesthesiologist, CRNA and  Surgeon  Anesthesia Plan Comments: (Patient consented for risks of anesthesia including but not limited to:  - adverse reactions to medications - risk of airway placement if required - damage to eyes, teeth, lips or other oral mucosa - nerve damage due to positioning  - sore throat or hoarseness - Damage to heart, brain, nerves, lungs, other parts of body or loss of life  Patient voiced understanding.)        Anesthesia Quick Evaluation

## 2020-06-03 NOTE — Op Note (Signed)
06/03/2020  6:13 PM    Stephanie [name]  [id number]   Pre-Op Dx: Dislocated Jaw  Post-op Dx: SAME  Proc: Closed reduction of mandibular dislocation  Surg:  Davina Poke  Anes:  GOT  EBL: 0  Comp: None  Findings: Anterior mandibular dislocation  Procedure: Jacobs was identified in the emergency room and taken to the operating room.  After IV sedation with propofol the mandible was grasped bilaterally distracted inferiorly and allow the condyles of the mandible to fall back into their anatomic position posteriorly.  Gave excellent reduction of the dislocation.  She had good occlusion following the seizure.  Dispo:   Good  Plan: She will be returned to the hospitalist service for care of her dehydration.  Would recommend clear liquid diet only no chewing or yawning.  She needs an appointment with oral surgery immediately upon discharge as she is a significant risk for repeat dislocation.  Davina Poke  06/03/2020 6:13 PM

## 2020-06-03 NOTE — Consult Note (Signed)
Stephanie [name], [name] [id number] 1955-06-18 Stephanie Finner, MD  Reason for Consult: Mandibular dislocation  HPI: Patient presented back to the emergency room several days ago was admitted for dehydration nausea and vomiting was noted to have difficulty with speech and swallowing by speech therapy.  The question is whether she had dislocated her mandible again.  Dr. Elenore Rota had done a closed reduction for mandibular dislocation on the seventh.  This morning it is not sure when this most recent dislocation happened.  ET scan obtained showed that she indeed had a repeat anterior dislocation.  Allergies: No Known Allergies  ROS: Review of systems normal other than 12 systems except per HPI.  PMH:  Past Medical History:  Diagnosis Date  . Diabetes mellitus without complication (HCC)     FH: History reviewed. No pertinent family history.  SH:  Social History   Socioeconomic History  . Marital status: Single    Spouse name: Not on file  . Number of children: Not on file  . Years of education: Not on file  . Highest education level: Not on file  Occupational History  . Not on file  Tobacco Use  . Smoking status: Former Smoker    Quit date: 04/2020    Years since quitting: 0.1  . Smokeless tobacco: Never Used  Vaping Use  . Vaping Use: Never used  Substance and Sexual Activity  . Alcohol use: Not on file  . Drug use: Not on file  . Sexual activity: Not on file  Other Topics Concern  . Not on file  Social History Narrative  . Not on file   Social Determinants of Health   Financial Resource Strain: Not on file  Food Insecurity: Not on file  Transportation Needs: Not on file  Physical Activity: Not on file  Stress: Not on file  Social Connections: Not on file  Intimate Partner Violence: Not on file    PSH:  Past Surgical History:  Procedure Laterality Date  . CLOSED REDUCTION MANDIBLE Bilateral 05/23/2020   Procedure: CLOSED REDUCTION MANDIBULAR;  Surgeon: Vernie Murders, MD;   Location: [facility] ORS;  Service: ENT;  Laterality: Bilateral;    Physical  Exam: Patient in no apparent distress was unable to close her mandible.  There was significant trismus and pain associated with this.  Anterior nose benign oral cavity or pharynx dry mucous membranes to palpation the neck is unremarkable external ears appeared clear.  A/P: Mandibular dislocation we will take her back to the operating room for IV sedation and closed reduction of this dislocation.  She understands risk benefits including bleeding infection mandibular fracture and recurrent dislocation.   Davina Poke 06/03/2020 6:11 PM

## 2020-06-03 NOTE — ED Notes (Signed)
Pt cleaned up and clean brief and chux placed on pt.

## 2020-06-03 NOTE — ED Notes (Signed)
Admit MD at bedside

## 2020-06-03 NOTE — ED Notes (Signed)
Report given to OR at this time. Pt's consents signed and placed with pt. Per OR transport is on the way to get pt for procedure.

## 2020-06-03 NOTE — ED Notes (Signed)
Pt assisted with bed pan

## 2020-06-03 NOTE — Progress Notes (Signed)
Patient ID: Stephanie [name], female   DOB: [birth year], 65 y.o.   MRN: [id number]  Per Infection control Cara--pt does not need to be in COVID isolation

## 2020-06-03 NOTE — ED Provider Notes (Signed)
Asked by Dr. Jenne Campus to see if I may be able to assist with mandibular reduction.  I went and saw the patient, discussed risks and benefits of mandibular reduction including risks of pain, swelling, mandibular fracture versus the benefits including be able to open and close her jaw improve her ability to eat and keep food and fluids down.  The patient however declined my offer to attempt reduction.  Discussed with Dr. Jenne Campus, he reports that he will see the patient in consult for further evaluation this afternoon   Sharyn Creamer, MD 06/03/20 1536

## 2020-06-03 NOTE — ED Notes (Signed)
Pt pulled up in bed with EDT Gabby and this RN.

## 2020-06-03 NOTE — ED Notes (Signed)
OR at bedside to take pt

## 2020-06-03 NOTE — ED Notes (Signed)
SLP at bedside.

## 2020-06-03 NOTE — Progress Notes (Signed)
Patient ID: Stephanie [name], female   DOB: [birth year], 65 y.o.   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 Dr. Jenne Campus is going to talk with the ED physician to see if patient could be sedated locally and reduction be done. Dr Genevive Bi is the only ENT at [facility] who does this procedure per Dr Jenne Campus. He was recommending patient will need oral surgeon to look into it. [facility] does not have any oral surgeon. Patient may need to be transferred to outlying Hospital however with current bed situation going to be challenging.

## 2020-06-03 NOTE — Plan of Care (Signed)
New plan of care initiated 

## 2020-06-03 NOTE — Evaluation (Addendum)
Clinical/Bedside Swallow Evaluation Patient Details  Name: Stephanie [name] MRN: [id number] Date of Birth: February 10, 1956  Today's Date: 06/03/2020 Time: SLP Start Time (ACUTE ONLY): 1215 SLP Stop Time (ACUTE ONLY): 1315 SLP Time Calculation (min) (ACUTE ONLY): 60 min  Past Medical History:  Past Medical History:  Diagnosis Date  . Diabetes mellitus without complication [facility])    Past Surgical History:  Past Surgical History:  Procedure Laterality Date  . CLOSED REDUCTION MANDIBLE Bilateral 05/23/2020   Procedure: CLOSED REDUCTION MANDIBULAR;  Surgeon: Vernie Murders, MD;  Location: [facility] ORS;  Service: ENT;  Laterality: Bilateral;   HPI:  Pt is a 65 y.o. female with medical history significant for hypertension, COVID-19 infection has completed remdesivir and Decadron treatment, tobacco abuse, hyperlipidemia, hysterectomy, presented to the emergency department for chief concerns of intractable nausea and vomiting for 1 day.  She reports that was not vomiting for 1 day.  She reports that since discharge from the hospital on 05/29/2020 she has only been eating applesauce.  She tolerated applesauce well however on 05/31/2020 she developed nausea and vomiting persistently all day.  She reports that she has not been able to tolerate any p.o. intake.  She reports that the vomitus is clear.  Pt was recently admitted to the hospital and at the time noted to have slurred speech and unable to eat/drink. ENT assessed on 05/23/2020: "Her mouth has been open all day long and she has been unable to eat at all". At that time, she had dislocated her mandible (bilateral), and he performed Closed reduction of mandibular dislocation bilaterally.  Pt reported currently that she was able to eat applesauce and drinking liquids but since this past weekend, she has been unable to drink or eat.   Assessment / Plan / Recommendation Clinical Impression  Pt appears to present w/ significant oropharyngeal phase dysphagia hampered by  little to no oro-lingual movements during bolus acceptance or management. W/ all bolus trials of ice chips, tsp of water, and 1/2 tsp of puree, she could not achieve mouth closure, lingual manipulation of bolus, A-P transfer, and had to allow bolus to fall from mouth(yaunker used to suction as well). Pt presented w/ a slight mouth open posture at rest but was able to achieve labial closure, She also achieved lingual protrustion and ROM(licking of lips). Lingual sensititivity was increased w/ pt withdrawing from tongue blade against the tongue; Gag reflex+. Noted a severely Dried tongue surface w/ dried phlegm from Palate to posterior tongue -- attempts at oral care by SLP to provide oral care/hygiene. No further po's attempted secondary to pt's presenttion and increased risk for aspiration/choking. Pt stated she felt a similar presentation during last admission when ENT then addressed her mandibular dislocation. MD consulted re: above and ENT consult to r/o dislocation again -- pt had recent vomiting episodes that may have incurred the dislocation. MD/ENT ordering a CT Maxillofacial scan. Recommend Strict NPO status w/ frequent oral care for hygiene. SLP Visit Diagnosis: Dysphagia, oropharyngeal phase (R13.12)    Aspiration Risk  Severe aspiration risk;Risk for inadequate nutrition/hydration    Diet Recommendation  Strict NPO status w/ frequent oral care for hygiene; aspiration precautions  Medication Administration: Via alternative means    Other  Recommendations Recommended Consults: Consider ENT evaluation (Dietician f/u) Oral Care Recommendations: Oral care QID;Staff/trained caregiver to provide oral care   Follow up Recommendations  (TBD)      Frequency and Duration  (TBD)   (TBD)       Prognosis Prognosis for  Safe Diet Advancement: Guarded Barriers to Reach Goals: Severity of deficits;Time post onset      Swallow Study   General Date of Onset: 06/01/20 HPI: Pt is a 65 y.o. female  with medical history significant for hypertension, COVID-19 infection has completed remdesivir and Decadron treatment, tobacco abuse, hyperlipidemia, hysterectomy, presented to the emergency department for chief concerns of intractable nausea and vomiting for 1 day.  She reports that was not vomiting for 1 day.  She reports that since discharge from the hospital on 05/29/2020 she has only been eating applesauce.  She tolerated applesauce well however on 05/31/2020 she developed nausea and vomiting persistently all day.  She reports that she has not been able to tolerate any p.o. intake.  She reports that the vomitus is clear.  Pt was recently admitted to the hospital and at the time noted to have slurred speech and unable to eat/drink. ENT assessed on 05/23/2020: "Her mouth has been open all day long and she has been unable to eat at all". At that time, she had dislocated her mandible (bilateral), and he performed Closed reduction of mandibular dislocation bilaterally.  Pt reported currently that she was able to eat applesauce and drinking liquids but since this past weekend, she has been unable to drink or eat. Type of Study: Bedside Swallow Evaluation Previous Swallow Assessment: none performed Diet Prior to this Study: Regular;Thin liquids Temperature Spikes Noted: No (wbc 13.6) Respiratory Status: Room air History of Recent Intubation: No Behavior/Cognition: Alert;Cooperative;Pleasant mood (weakness) Oral Cavity Assessment: Dry;Dried secretions (BOT dried) Oral Care Completed by SLP: Yes Oral Cavity - Dentition: Adequate natural dentition Vision: Functional for self-feeding Self-Feeding Abilities: Able to feed self;Needs set up;Needs assist Patient Positioning: Upright in bed (positioied more upright) Baseline Vocal Quality: Normal (dysarthria) Volitional Cough: Strong Volitional Swallow: Unable to elicit    Oral/Motor/Sensory Function Overall Oral Motor/Sensory Function: Moderate  impairment Facial ROM: Within Functional Limits Facial Symmetry: Within Functional Limits Facial Strength: Within Functional Limits Lingual ROM:  (reduced) Lingual Symmetry: Within Functional Limits Lingual Strength: Reduced Velum: Within Functional Limits (but difficult to see) Mandible:  (DNT)   Ice Chips Ice chips: Impaired Pharyngeal Phase Impairments:  (expectorated)   Thin Liquid Thin Liquid: Impaired Pharyngeal  Phase Impairments:  (expectorated)    Nectar Thick Nectar Thick Liquid: Not tested   Honey Thick Honey Thick Liquid: Not tested   Puree Puree: Impaired Pharyngeal Phase Impairments:  (expectorated)   Solid     Solid: Not tested        Jerilynn Som, MS, CCC-SLP Speech Language Pathologist Rehab Services [phone] [facility] 06/03/2020,3:27 PM

## 2020-06-04 ENCOUNTER — Encounter: Payer: Self-pay | Admitting: Unknown Physician Specialty

## 2020-06-04 DIAGNOSIS — N179 Acute kidney failure, unspecified: Secondary | ICD-10-CM

## 2020-06-04 DIAGNOSIS — U071 COVID-19: Secondary | ICD-10-CM

## 2020-06-04 LAB — GLUCOSE, CAPILLARY: Glucose-Capillary: 110 mg/dL — ABNORMAL HIGH (ref 70–99)

## 2020-06-04 MED ORDER — PROSOURCE PLUS PO LIQD
30.0000 mL | Freq: Three times a day (TID) | ORAL | Status: DC
  Administered 2020-06-04: 30 mL via ORAL
  Filled 2020-06-04: qty 30

## 2020-06-04 MED ORDER — ADULT MULTIVITAMIN W/MINERALS CH
1.0000 | ORAL_TABLET | Freq: Every day | ORAL | Status: DC
  Administered 2020-06-05 – 2020-06-17 (×11): 1 via ORAL
  Filled 2020-06-04 (×12): qty 1

## 2020-06-04 MED ORDER — BOOST / RESOURCE BREEZE PO LIQD CUSTOM
1.0000 | Freq: Three times a day (TID) | ORAL | Status: DC
  Administered 2020-06-04 – 2020-06-05 (×3): 1 via ORAL

## 2020-06-04 NOTE — Progress Notes (Signed)
Progress Note    Stephanie [name]  OXB:[id number] DOB: 1955/12/06  DOA: 06/01/2020 PCP: Pcp, No      Brief Narrative:    Medical records reviewed and are as summarized below:  Stephanie [name] is a 65 y.o. female with medical history significant for hypertension,AKI, chronic diastolic CHF, hyperlipidemia, recently discharged from the hospital on 05/29/2020 after hospitalization for COVID-19 infection and acute stroke, history of mandibular dislocation requiring closed reduction on 05/23/2020.  She presented to the hospital because of intractable nausea and vomiting.      Assessment/Plan:   Principal Problem:   Intractable vomiting with nausea Active Problems:   COVID-19   Dehydration   Primary hypertension   AKI (acute kidney injury) (HCC)   Gastroenteritis due to COVID-19 virus   Acute kidney injury (HCC)   Nutrition Problem: Inadequate oral intake Etiology: decreased appetite,nausea,vomiting  Signs/Symptoms: per patient/family report   Body mass index is 22.31 kg/m.     PLAN    S/p closed reduction mandibular dislocation on 06/03/2020.  Antiemetics as needed for nausea/vomiting.  Advance diet as tolerated.  Continue aspirin, Plavix and Lipitor for recent stroke.  Continue amlodipine for hypertension  Creatinine is improving.  Discontinue IV fluids and monitor BMP.  Consult PT and OT for generalized weakness  Recent COVID-19 infection (diagnosed on 05/17/2020)    Diet Order            Diet clear liquid Room service appropriate? Yes; Fluid consistency: Thin  Diet effective now                    Consultants:  ENT surgeon  Procedures:  Closed reduction of mandibular dislocation on 06/03/2020    Medications:   . (feeding supplement) PROSource Plus  30 mL Oral TID BM  . amLODipine  10 mg Oral Daily  . aspirin EC  81 mg Oral Daily  . atorvastatin  40 mg Oral q1800  . clopidogrel  75 mg Oral Daily  . docusate sodium  100 mg Oral BID   . enoxaparin (LOVENOX) injection  40 mg Subcutaneous Q24H  . feeding supplement  1 Container Oral TID WC  . Ipratropium-Albuterol  1 puff Inhalation Q6H  . [START ON 06/05/2020] multivitamin with minerals  1 tablet Oral Daily   Continuous Infusions: . sodium chloride 75 mL/hr at 06/03/20 2001     Anti-infectives (From admission, onward)   None             Family Communication/Anticipated D/C date and plan/Code Status   DVT prophylaxis: enoxaparin (LOVENOX) injection 40 mg Start: 06/02/20 1000 Place TED hose Start: 06/01/20 2212     Code Status: Full Code  Family Communication: None Disposition Plan:    Status is: Inpatient  Remains inpatient appropriate because:Unsafe d/c plan   Dispo: The patient is from: Home              Anticipated d/c is to: Home              Anticipated d/c date is: 1 day              Patient currently is not medically stable to d/c.           Subjective:   C/o generalized weakness.  No nausea, vomiting or abdominal pain.  No jaw pain.  Objective:    Vitals:   06/04/20 0352 06/04/20 0859 06/04/20 1206 06/04/20 1436  BP: 138/63 133/75 110/73   Pulse: Marland Kitchen)  105 78 71   Resp: 18 18 16    Temp: 98.4 F (36.9 C) 98.1 F (36.7 C) 98.2 F (36.8 C)   TempSrc:      SpO2: 100% 96% 95%   Weight:    74.6 kg  Height:       No data found.   Intake/Output Summary (Last 24 hours) at 06/04/2020 1447 Last data filed at 06/04/2020 1013 Gross per 24 hour  Intake 100 ml  Output --  Net 100 ml   Filed Weights   06/01/20 1930 06/03/20 1126 06/04/20 1436  Weight: 67.1 kg 67.1 kg 74.6 kg    Exam:  GEN: NAD SKIN: No rash EYES: EOMI ENT: MMM CV: RRR PULM: CTA B ABD: soft, ND, NT, +BS CNS: AAO x 3, non focal EXT: No edema or tenderness   Data Reviewed:   I have personally reviewed following labs and imaging studies:  Labs: Labs show the following:   Basic Metabolic Panel: Recent Labs  Lab 05/29/20 0237  06/01/20 1939 06/02/20 0405  NA  --  142 141  K  --  3.9 4.5  CL  --  108 110  CO2  --  23 24  GLUCOSE  --  145* 115*  BUN  --  62* 55*  CREATININE  --  1.40* 1.19*  CALCIUM  --  10.0 9.8  MG 2.2  --   --    GFR Estimated Creatinine Clearance: 55.8 mL/min (A) (by C-G formula based on SCr of 1.19 mg/dL (H)). Liver Function Tests: No results for input(s): AST, ALT, ALKPHOS, BILITOT, PROT, ALBUMIN in the last 168 hours. No results for input(s): LIPASE, AMYLASE in the last 168 hours. No results for input(s): AMMONIA in the last 168 hours. Coagulation profile No results for input(s): INR, PROTIME in the last 168 hours.  CBC: Recent Labs  Lab 06/01/20 1939 06/02/20 0405  WBC 13.6* 13.6*  HGB 9.3* 8.9*  HCT 28.0* 26.2*  MCV 86.7 86.2  PLT 154 158   Cardiac Enzymes: No results for input(s): CKTOTAL, CKMB, CKMBINDEX, TROPONINI in the last 168 hours. BNP (last 3 results) No results for input(s): PROBNP in the last 8760 hours. CBG: Recent Labs  Lab 05/28/20 1656 06/01/20 1947 06/03/20 2256  GLUCAP 146* 125* 110*   D-Dimer: No results for input(s): DDIMER in the last 72 hours. Hgb A1c: No results for input(s): HGBA1C in the last 72 hours. Lipid Profile: No results for input(s): CHOL, HDL, LDLCALC, TRIG, CHOLHDL, LDLDIRECT in the last 72 hours. Thyroid function studies: Recent Labs    06/02/20 0129  TSH 2.507   Anemia work up: Recent Labs    06/02/20 0129  VITAMINB12 111*  FOLATE 5.6*  FERRITIN 802*  TIBC 178*  IRON 54  RETICCTPCT 1.6   Sepsis Labs: Recent Labs  Lab 06/01/20 1939 06/01/20 2015 06/02/20 0405  WBC 13.6*  --  13.6*  LATICACIDVEN  --  1.9  --     Microbiology Recent Results (from the past 240 hour(s))  Blood culture (single)     Status: None (Preliminary result)   Collection Time: 06/02/20  1:44 AM   Specimen: BLOOD  Result Value Ref Range Status   Specimen Description BLOOD RIGHT [facility]  Final   Special Requests   Final    BOTTLES  DRAWN AEROBIC AND ANAEROBIC Blood Culture adequate volume   Culture   Final    NO GROWTH 2 DAYS Performed at [facility], [address] [place], [place]  [address]    Report Status PENDING  Incomplete    Procedures and diagnostic studies:  CT MAXILLOFACIAL WO CONTRAST  Result Date: 06/03/2020 CLINICAL DATA:  Jaw dislocation EXAM: CT MAXILLOFACIAL WITHOUT CONTRAST TECHNIQUE: Multidetector CT imaging of the maxillofacial structures was performed. Multiplanar CT image reconstructions were also generated. COMPARISON:  05/23/2020 and prior. FINDINGS: Osseous: Anterior displacement of the bilateral mandibular condyles is unchanged. No fracture. Multifocal carious disease. Orbits: Negative. No traumatic or inflammatory finding. Sinuses: Posterior right ethmoid frothy secretions. Otherwise clear paranasal sinuses. Soft tissues: Negative. Limited intracranial: No acute finding. 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 IMPRESSION: Anterior displacement of bilateral mandibular condyles, unchanged. No fracture. Multifocal carious disease. Peripherally calcified right cavernous ICA aneurysm is unchanged in size. Electronically Signed   By: Stana Bunting M.D.   On: 06/03/2020 14:16               LOS: 2 days   Dynisha Due  [facility]   Pager on [email or web address]. If 7PM-7AM, please contact night-coverage at [email or web address]     06/04/2020, 2:47 PM

## 2020-06-04 NOTE — Progress Notes (Addendum)
Initial Nutrition Assessment  DOCUMENTATION CODES:   Not applicable  INTERVENTION:  Provide Boost Breeze po TID with meals, each supplement provides 250 kcal and 9 grams of protein.  Provide PROSource Plus po TID between meals, each supplement provides 100 kcal and 15 grams of protein.  Provide MVI po daily.  Pt with poor oral intake and RD is limited on use of supplements with clear liquid diet. Given pt's hx of DM, RD will reassess adequacy of PO intake, CBGS, and adjust supplement regimen as appropriate at follow-up.   NUTRITION DIAGNOSIS:   Inadequate oral intake related to decreased appetite,nausea,vomiting as evidenced by per patient/family report.  GOAL:   Patient will meet greater than or equal to 90% of their needs  MONITOR:   PO intake,Diet advancement,Supplement acceptance,Labs,Weight trends,I & O's  REASON FOR ASSESSMENT:   Malnutrition Screening Tool    ASSESSMENT:   65 year old female with PMHx of DM, recent acute CVA, GI bleed, COVID-19 infection, recent bilateral mandibular dislocation s/p closed reduction of mandibular dislocation bilaterally on 05/23/2020 who is admitted with intractable N/V due to COVID-19 gastroenteritis, AKI, and recurrent dislocated jaw s/p closed reduction of mandibular dislocation on 06/03/2020.   Met with patient at bedside. She was not able to provide a very thorough history and continued to fall asleep during assessment. Patient endorses decreased appetite and intake for a while now. She reports at baseline only eating one meal per day but is unable to describe typical intake at that meal. She reports she has been on liquid diet for a while. RD asks if patient was on liquid diet prior to first mandible surgery as well and patient reports yes but is unable to describe why or how long exactly she has been on liquids. She is unable to report when her last solid meal was. Per chart patient ate 0% of breakfast this morning. Discussed importance  of adequate intake. Patient is amenable to trying clear oral nutrition supplements to help meet calorie/protein needs. She is unsure how long the surgeon would like her to stay on clear liquid diet.  Patient reports her UBW was 185 lbs and she has lost approximately 20 lbs over unknown time period. Weights in chart appear stated and not truly measured. RD obtained bed scale weight today of 74.6 kg (164.46 lbs). Per report patient has lost approximately 21 lbs (11.4% body weight) over unknown time frame, so unable to determine significance.  Medications reviewed and include: Colace 100 mg BID, NS at 75 mL/hr.  Labs reviewed: CBG 110, BUN 55, Creatinine 1.19.  Patient is at high risk for development of malnutrition. She has increased nutrient needs due to catabolic nature of recent COVID-19 infection and now has decreased oral intake related to N/V and restriction to liquid diet for healing s/p closed reduction of mandibular dislocation.  Discussed with RN via secure chat. Patient was still experiencing dry heaves this AM. Per chart plan is for liquid diet as she cannot chew after surgery or she risks repeat dislocation.  NUTRITION - FOCUSED PHYSICAL EXAM:  Flowsheet Row Most Recent Value  Orbital Region No depletion  Upper Arm Region Mild depletion  Thoracic and Lumbar Region No depletion  Buccal Region Unable to assess  Temple Region Mild depletion  Clavicle Bone Region Mild depletion  Clavicle and Acromion Bone Region No depletion  Scapular Bone Region No depletion  Dorsal Hand Moderate depletion  Patellar Region No depletion  Anterior Thigh Region No depletion  Posterior Calf Region No  depletion  Edema (RD Assessment) None  Hair Reviewed  Eyes Reviewed  Mouth Unable to assess  Skin Reviewed  Nails Reviewed     Diet Order:   Diet Order            Diet clear liquid Room service appropriate? Yes; Fluid consistency: Thin  Diet effective now                EDUCATION NEEDS:    No education needs have been identified at this time  Skin:  Skin Assessment: Skin Integrity Issues: Skin Integrity Issues:: Incisions Incisions: closed incision to lip  Last BM:  06/04/2020 - smear type 6  Height:   Ht Readings from Last 1 Encounters:  06/03/20 6' (1.829 m)   Weight:   Wt Readings from Last 1 Encounters:  06/04/20 74.6 kg   BMI:  Body mass index is 22.31 kg/m.  Estimated Nutritional Needs:   Kcal:  1900-2100  Protein:  95-105 grams  Fluid:  >/= 2.2 L/day  Jacklynn Barnacle, MS, RD, LDN Pager number available on Amion

## 2020-06-04 NOTE — Progress Notes (Addendum)
Speech Language Pathology Treatment: Dysphagia  Patient Details Name: Stephanie [name] MRN: [id number] DOB: 1956/05/04 Today's Date: 06/04/2020 Time: 5397-6734 SLP Time Calculation (min) (ACUTE ONLY): 25 min  Assessment / Plan / Recommendation Clinical Impression  Pt seen for ongoing assessment and status of swallowing today. She has h/o mandibular dislocation requiring closed reduction on 05/23/2020; this was repeated again s/p repeat dislocation at this admit. ENT performed reduction yesterday PM. She appears much improved today; verbally responsive and able to engage in conversation w/ speech wfl. Pt is on a clear liquid diet w/out difficulty swallowing per pt. Pt is on RA; afebrile.  Pt explained general aspiration precautions and agreed verbally to the need for following them especially sitting upright for all oral intake; drinking slowly w/ small sips. Pt positioned self upright then consumed ~4 ozs of thin liquids via Straw w/ No overt clinical s/s of aspiration noted; no overt coughing, respiratory status remained calm and unlabored, vocal quality clear b/t trials. Pt fed self following instructions for single, small sips slowly. Oral phase appeared grossly [facility] for bolus management and timely A-P transfer for swallowing; oral clearing achieved w/ all trials.  Recommend continue w/ current diet per MD/ENT w/ consideration of upgrade to Full Liquids diet/purees w/ gravies added to moisten foods next; w/ Thin liquids. Recommend general aspiration precautions; Pills Whole in Puree for safer swallowing; tray setup and positioning assistance at meals as needed. ST services will be available for further education as needed while admitted. Noted ENT and GI following; they will advance her diet as appropriate. MD to reconsult if new needs arise. NSG updated. Discussed general aspiration precautions and food consistencies easy to eat/chew and use of condiments to moisten well. Precautions posted at bedside.  Pt agreed.    HPI HPI: Pt is a 65 y.o. female with medical history significant for hypertension, COVID-19 infection has completed remdesivir and Decadron treatment, tobacco abuse, hyperlipidemia, hysterectomy, presented to the emergency department for chief concerns of intractable nausea and vomiting for 1 day.  She reports that was not vomiting for 1 day.  She reports that since discharge from the hospital on 05/29/2020 she has only been eating applesauce.  She tolerated applesauce well however on 05/31/2020 she developed nausea and vomiting persistently all day.  She reports that she has not been able to tolerate any p.o. intake.  She reports that the vomitus is clear.  Pt was recently admitted to the hospital and at the time noted to have slurred speech and unable to eat/drink. ENT assessed on 05/23/2020: "Her mouth has been open all day long and she has been unable to eat at all". At that time, she had dislocated her mandible (bilateral), and he performed Closed reduction of mandibular dislocation bilaterally.  Pt reported currently that she was able to eat applesauce and drinking liquids but since this past weekend, she has been unable to drink or eat.      SLP Plan  All goals met       Recommendations  Diet recommendations: Thin liquid (clears w/ MD to upgrade as appropriate per ENT) Liquids provided via: Cup;Straw Medication Administration: Whole meds with puree (for safer swallowing) Supervision: Patient able to self feed Compensations: Minimize environmental distractions;Slow rate;Small sips/bites Postural Changes and/or Swallow Maneuvers: Seated upright 90 degrees;Upright 30-60 min after meal;Out of bed for meals                General recommendations:  (Dietician f/u as needed) Oral Care Recommendations: Oral care BID;Oral  care before and after PO;Patient independent with oral care Follow up Recommendations: None SLP Visit Diagnosis: Dysphagia, unspecified (R13.10) (dislocated  mandible - reset) Plan: All goals met       GO                Stephanie Kenner, MS, CCC-SLP Speech Language Pathologist Rehab Services [phone] [facility] 06/04/2020, 5:28 PM

## 2020-06-04 NOTE — TOC Progression Note (Signed)
Transition of Care [facility]) - Progression Note    Patient Details  Name: Stephanie [name] MRN: [id number] Date of Birth: 07/04/1955  Transition of Care [facility]) CM/SW Contact  Trenton Founds, RN Phone Number: 06/04/2020, 4:31 PM  Clinical Narrative:   RNCM received call from patient's cousin Dois Davenport who was in room to discuss patient placement options. Discussed with Dois Davenport that at this point we do not have any recommendations from therapy to discuss for her placement. Dois Davenport relays that her aunt tried to take patient home but she was unable to care for her and will be unable to take her back.           Expected Discharge Plan and Services           Expected Discharge Date: 06/05/20                                     Social Determinants of Health (SDOH) Interventions    Readmission Risk Interventions No flowsheet data found.

## 2020-06-05 DIAGNOSIS — E860 Dehydration: Secondary | ICD-10-CM

## 2020-06-05 DIAGNOSIS — D649 Anemia, unspecified: Secondary | ICD-10-CM | POA: Diagnosis not present

## 2020-06-05 LAB — CBC WITH DIFFERENTIAL/PLATELET
Abs Immature Granulocytes: 0.02 10*3/uL (ref 0.00–0.07)
Basophils Absolute: 0 10*3/uL (ref 0.0–0.1)
Basophils Relative: 0 %
Eosinophils Absolute: 0.1 10*3/uL (ref 0.0–0.5)
Eosinophils Relative: 2 %
HCT: 17.3 % — ABNORMAL LOW (ref 36.0–46.0)
Hemoglobin: 5.7 g/dL — ABNORMAL LOW (ref 12.0–15.0)
Immature Granulocytes: 0 %
Lymphocytes Relative: 17 %
Lymphs Abs: 1.1 10*3/uL (ref 0.7–4.0)
MCH: 28.9 pg (ref 26.0–34.0)
MCHC: 32.9 g/dL (ref 30.0–36.0)
MCV: 87.8 fL (ref 80.0–100.0)
Monocytes Absolute: 0.7 10*3/uL (ref 0.1–1.0)
Monocytes Relative: 10 %
Neutro Abs: 4.8 10*3/uL (ref 1.7–7.7)
Neutrophils Relative %: 71 %
Platelets: 107 10*3/uL — ABNORMAL LOW (ref 150–400)
RBC: 1.97 MIL/uL — ABNORMAL LOW (ref 3.87–5.11)
RDW: 14.2 % (ref 11.5–15.5)
Smear Review: NORMAL
WBC: 6.8 10*3/uL (ref 4.0–10.5)
nRBC: 0.3 % — ABNORMAL HIGH (ref 0.0–0.2)

## 2020-06-05 LAB — ABO/RH: ABO/RH(D): O POS

## 2020-06-05 LAB — BASIC METABOLIC PANEL
Anion gap: 4 — ABNORMAL LOW (ref 5–15)
BUN: 22 mg/dL (ref 8–23)
CO2: 24 mmol/L (ref 22–32)
Calcium: 8.7 mg/dL — ABNORMAL LOW (ref 8.9–10.3)
Chloride: 111 mmol/L (ref 98–111)
Creatinine, Ser: 0.95 mg/dL (ref 0.44–1.00)
GFR, Estimated: >60 mL/min (ref >60)
Glucose, Bld: 84 mg/dL (ref 70–99)
Potassium: 3.6 mmol/L (ref 3.5–5.1)
Sodium: 139 mmol/L (ref 135–145)

## 2020-06-05 LAB — HEMOGLOBIN AND HEMATOCRIT, BLOOD
HCT: 17.8 % — ABNORMAL LOW (ref 36.0–46.0)
Hemoglobin: 5.8 g/dL — ABNORMAL LOW (ref 12.0–15.0)

## 2020-06-05 LAB — PREPARE RBC (CROSSMATCH)

## 2020-06-05 MED ORDER — SODIUM CHLORIDE 0.9% IV SOLUTION: Freq: Once | INTRAVENOUS | Status: AC

## 2020-06-05 MED ORDER — FOLIC ACID 1 MG PO TABS
1.0000 mg | ORAL_TABLET | Freq: Every day | ORAL | Status: DC
  Administered 2020-06-07 – 2020-06-09 (×3): 1 mg via ORAL
  Filled 2020-06-05 (×4): qty 1

## 2020-06-05 MED ORDER — CYANOCOBALAMIN 1000 MCG/ML IJ SOLN
1000.0000 ug | Freq: Every day | INTRAMUSCULAR | Status: DC
  Administered 2020-06-05 – 2020-06-10 (×5): 1000 ug via SUBCUTANEOUS
  Filled 2020-06-05 (×7): qty 1

## 2020-06-05 NOTE — Plan of Care (Signed)
Pt Axox4. Calm and cooperative and able to voice her needs. Prn med for constipation adm. Pt had a smear. Vitals stable.  Pt able to use bedside commode. NS IV at 95ml/hr. On RA. Safety measures in place. Will continue to monitor.  Problem: Education: Goal: Knowledge of General Education information will improve Description: Including pain rating scale, medication(s)/side effects and non-pharmacologic comfort measures Outcome: Progressing   Problem: Health Behavior/Discharge Planning: Goal: Ability to manage health-related needs will improve Outcome: Progressing   Problem: Clinical Measurements: Goal: Ability to maintain clinical measurements within normal limits will improve Outcome: Progressing Goal: Will remain free from infection Outcome: Progressing Goal: Diagnostic test results will improve Outcome: Progressing Goal: Respiratory complications will improve Outcome: Progressing Goal: Cardiovascular complication will be avoided Outcome: Progressing   Problem: Activity: Goal: Risk for activity intolerance will decrease Outcome: Progressing   Problem: Nutrition: Goal: Adequate nutrition will be maintained Outcome: Progressing   Problem: Nutrition: Goal: Adequate nutrition will be maintained Outcome: Progressing   Problem: Coping: Goal: Level of anxiety will decrease Outcome: Progressing   Problem: Elimination: Goal: Will not experience complications related to bowel motility Outcome: Progressing Goal: Will not experience complications related to urinary retention Outcome: Progressing   Problem: Safety: Goal: Ability to remain free from injury will improve Outcome: Progressing   Problem: Skin Integrity: Goal: Risk for impaired skin integrity will decrease Outcome: Progressing

## 2020-06-05 NOTE — Progress Notes (Signed)
PT Cancellation Note  Patient Details Name: Stephanie [name] MRN: [id number] DOB: Oct 31, 1955   Cancelled Treatment:    Reason Eval/Treat Not Completed: Patient not medically ready. PT orders received. Per chart review pt is not medically stable, with Hgb of 5.7 g/dL. Will hold therapy intervention until pt medically appropriate.  Vira Blanco, PT, DPT 8:20 AM,06/05/20

## 2020-06-05 NOTE — Plan of Care (Signed)

## 2020-06-05 NOTE — Progress Notes (Signed)
Pt reports difficulty moving bowels. Requests to ambulate with walker. Instructed her to wait for staff to assist with ambulation. She voiced understanding. Message to RN with pt's request.

## 2020-06-05 NOTE — Consult Note (Addendum)
Stephanie Darby, MD [address]  [place]  [place], [place] [address]  Main: [phone]  Fax: [phone] Pager: [phone]   Consultation  Referring Provider:     No ref. provider found Primary Care Physician:  Pcp, No Primary Gastroenterologist: Althia Forts         Reason for Consultation:     Anemia  Date of Admission:  06/01/2020 Date of Consultation:  06/06/2020         HPI:   Stephanie [name] is a 65 y.o. female with history of recent acute ischemic stroke on 05/18/2020 who is on DAPT, history of acute on chronic diastolic heart failure, history of COVID-19 pneumonia in early January.  Patient had anterior dislocation of the mandible in early January that was reduced on 1/7.  Patient was discharged on 1/13.  She got readmitted on 1/16 secondary to nausea, vomiting, generalized fatigue.  Her hemoglobin was 9.3 on 1/16, dropped to 5.7 on 1/20.  Therefore, GI is consulted due to worsening anemia.  Her hemoglobin was 8.9 on 1/17.  Her hemoglobin was not checked for 2 days in between.  She received 1 unit of PRBCs and responded appropriately, patient is currently receiving second unit when I interviewed her.  She is also found to have severe B12 and folate deficiency and was started on supplements yesterday.  Patient reports that she had black stool about a month ago only.  She denies alcohol use, drug abuse.  She does smoke tobacco Patient denies abdominal pain, nausea or vomiting, black stools.  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 and underwent closed reduction by ENT on 1/18  NSAIDs: None  Antiplts/Anticoagulants/Anti thrombotics: Aspirin and Plavix, last dose on 1/19  GI Procedures: None  Past Medical History:  Diagnosis Date   Diabetes mellitus without complication ([place])     Past Surgical History:  Procedure Laterality Date   CLOSED REDUCTION MANDIBLE Bilateral 05/23/2020   Procedure: CLOSED REDUCTION MANDIBULAR;  Surgeon:  Margaretha Sheffield, MD;  Location: [facility] ORS;  Service: ENT;  Laterality: Bilateral;   CLOSED REDUCTION MANDIBLE N/A 06/03/2020   Procedure: CLOSED REDUCTION MANDIBULAR;  Surgeon: Beverly Gust, MD;  Location: [facility] ORS;  Service: ENT;  Laterality: N/A;    Prior to Admission medications   Medication Sig Start Date End Date Taking? Authorizing Provider  amLODipine (NORVASC) 10 MG tablet Take 1 tablet (10 mg total) by mouth daily. 05/30/20   Fritzi Mandes, MD  aspirin EC 81 MG EC tablet Take 1 tablet (81 mg total) by mouth daily. Swallow whole. 05/30/20   Fritzi Mandes, MD  atorvastatin (LIPITOR) 40 MG tablet Take 1 tablet (40 mg total) by mouth daily. 05/30/20   Fritzi Mandes, MD  clopidogrel (PLAVIX) 75 MG tablet Take 1 tablet (75 mg total) by mouth daily. 05/30/20   Fritzi Mandes, MD  hydrALAZINE (APRESOLINE) 25 MG tablet Take 1 tablet (25 mg total) by mouth 2 (two) times daily. 05/29/20   Fritzi Mandes, MD  Ipratropium-Albuterol (COMBIVENT) 20-100 MCG/ACT AERS respimat Inhale 1 puff into the lungs every 6 (six) hours. 05/29/20   Fritzi Mandes, MD  lisinopril (ZESTRIL) 5 MG tablet Take 1 tablet (5 mg total) by mouth daily. 05/30/20   Fritzi Mandes, MD   Current Facility-Administered Medications:    0.9 %  sodium chloride infusion (Manually program via Guardrails IV Fluids), , Intravenous, Once, Jennye Boroughs, MD   acetaminophen (TYLENOL) tablet 650 mg, 650 mg, Oral, Q6H PRN **OR** acetaminophen (TYLENOL) suppository 325  mg, 325 mg, Rectal, Q6H PRN, Cox, Amy N, DO   amLODipine (NORVASC) tablet 10 mg, 10 mg, Oral, Daily, Cox, Amy N, DO, 10 mg at 06/05/20 1043   [START ON 06/07/2020] aspirin EC tablet 81 mg, 81 mg, Oral, Daily, Jennye Boroughs, MD   atorvastatin (LIPITOR) tablet 40 mg, 40 mg, Oral, q1800, Cox, Amy N, DO, 40 mg at 06/05/20 1753   bisacodyl (DULCOLAX) EC tablet 5 mg, 5 mg, Oral, Daily PRN, Sharion Settler, NP, 5 mg at 06/04/20 2109   cyanocobalamin ((VITAMIN B-12)) injection 1,000 mcg, 1,000 mcg,  Subcutaneous, Daily, Jennye Boroughs, MD, 1,000 mcg at 06/05/20 1511   docusate sodium (COLACE) capsule 100 mg, 100 mg, Oral, BID, Sharion Settler, NP, 100 mg at 30/16/01 0932   folic acid (FOLVITE) tablet 1 mg, 1 mg, Oral, Daily, Jennye Boroughs, MD   hydrALAZINE (APRESOLINE) tablet 25 mg, 25 mg, Oral, Q8H PRN, Cox, Amy N, DO   Ipratropium-Albuterol (COMBIVENT) respimat 1 puff, 1 puff, Inhalation, Q6H, Cox, Amy N, DO, 1 puff at 06/05/20 2045   multivitamin with minerals tablet 1 tablet, 1 tablet, Oral, Daily, Jennye Boroughs, MD, 1 tablet at 06/05/20 1043   ondansetron (ZOFRAN) tablet 4 mg, 4 mg, Oral, Q6H PRN **OR** ondansetron (ZOFRAN) injection 4 mg, 4 mg, Intravenous, Q6H PRN, Cox, Amy N, DO, 4 mg at 06/02/20 0538   pantoprazole (PROTONIX) EC tablet 40 mg, 40 mg, Oral, BID, Jennye Boroughs, MD   History reviewed. No pertinent family history.   Social History   Tobacco Use   Smoking status: Former Smoker    Quit date: 04/2020    Years since quitting: 0.1   Smokeless tobacco: Never Used  Vaping Use   Vaping Use: Never used    Allergies as of 06/01/2020   (No Known Allergies)    Review of Systems:    All systems reviewed and negative except where noted in HPI.   Physical Exam:  Vital signs in last 24 hours: Temp:  [98 F (36.7 C)-98.9 F (37.2 C)] 98 F (36.7 C) (01/21 1306) Pulse Rate:  [64-92] 73 (01/21 1306) Resp:  [14-20] 14 (01/21 1306) BP: (116-145)/(62-81) 129/74 (01/21 1306) SpO2:  [94 %-100 %] 96 % (01/21 1306) Last BM Date: 05/20/20 General:   Pleasant, cooperative in NAD Head:  Normocephalic and atraumatic. Eyes:   No icterus.   Conjunctiva pale. PERRLA. Ears:  Normal auditory acuity. Neck:  Supple; no masses or thyroidomegaly Lungs: Respirations even and unlabored. Lungs clear to auscultation bilaterally.   No wheezes, crackles, or rhonchi.  Heart:  Regular rate and rhythm;  Without murmur, clicks, rubs or gallops Abdomen:  Soft, nondistended,  nontender. Normal bowel sounds. No appreciable masses or hepatomegaly.  No rebound or guarding.  Rectal:  Not performed. Msk:  Symmetrical without gross deformities.  Strength generalized weakness Extremities:  Without edema, cyanosis or clubbing. Neurologic:  Alert and oriented x3;  grossly normal neurologically. Skin:  Intact without significant lesions or rashes. Cervical Nodes:  No significant cervical adenopathy. Psych:  Alert and cooperative. Normal affect.  LAB RESULTS: CBC Latest Ref Rng & Units 06/06/2020 06/05/2020 06/05/2020  WBC 4.0 - 10.5 K/uL 8.5 - 6.8  Hemoglobin 12.0 - 15.0 g/dL 7.0(L) 5.8(L) 5.7(L)  Hematocrit 36.0 - 46.0 % 20.5(L) 17.8(L) 17.3(L)  Platelets 150 - 400 K/uL 125(L) - 107(L)    BMET BMP Latest Ref Rng & Units 06/05/2020 06/02/2020 06/01/2020  Glucose 70 - 99 mg/dL 84 115(H) 145(H)  BUN 8 - 23 mg/dL 22  55(H) 62(H)  Creatinine 0.44 - 1.00 mg/dL 0.95 1.19(H) 1.40(H)  Sodium 135 - 145 mmol/L 139 141 142  Potassium 3.5 - 5.1 mmol/L 3.6 4.5 3.9  Chloride 98 - 111 mmol/L 111 110 108  CO2 22 - 32 mmol/L $RemoveB'24 24 23  'EqoJimRT$ Calcium 8.9 - 10.3 mg/dL 8.7(L) 9.8 10.0    LFT Hepatic Function Latest Ref Rng & Units 05/28/2020 05/27/2020 05/26/2020  Total Protein 6.5 - 8.1 g/dL 5.1(L) 5.3(L) 5.4(L)  Albumin 3.5 - 5.0 g/dL 2.6(L) 2.7(L) 2.7(L)  AST 15 - 41 U/L 12(L) 9(L) 11(L)  ALT 0 - 44 U/L $Remo'11 11 14  'nhpDJ$ Alk Phosphatase 38 - 126 U/L 48 44 47  Total Bilirubin 0.3 - 1.2 mg/dL 0.7 0.8 0.7     STUDIES: No results found.    Impression / Plan:   Stephanie [name] is a 11 y.o. African-American female with history of chronic tobacco use, SARS COVID-19 pneumonia in early January, is acute ischemic stroke in early January 2022, on DAPT who is admitted with nausea, nonbloody emesis as well as generalized fatigue found to have acute on chronic anemia, reported black stools about a month ago  History of melena, severe symptomatic anemia No evidence of active GI bleed at this time,  patient's anemia is likely multifactorial Iron studies revealed significantly elevated ferritin 802, severe B12 deficiency 05/17/2009, folate deficiency 5.6 DAPT has been held due to concern for severe anemia and possible GI bleed Patient will need elective/nonurgent upper endoscopy as well as colonoscopy.  With history of recurrent mandibular dislocation within the last 2 weeks, patient will be at high risk for anesthesia to undergo upper endoscopy, in case if she needs intubation.  Her case has been discussed with the ENT, Dr. Tami Ribas who agrees with the risk of intubation and her mandibular dislocation.  Patient will need to be evaluated by oral maxillofacial surgeon before undergoing any endoscopy procedures Okay to restart aspirin 81 mg from GI standpoint given her history of recent stroke.  Her case has been discussed with neurology, who agreed to hold Plavix temporarily Recommend long-term Protonix 40 mg or omeprazole 40 mg twice daily until upper GI source is ruled out Recommend to check H. pylori IgG Continue B12 [id number] daily and folic acid 1 mg daily Patient should follow-up with GI as outpatient  Nausea and vomiting Patient is no longer experiencing nausea and vomiting Diet as tolerated  Thank you for involving me in the care of this patient.  GI will sign off at this time, please call us back with questions or concerns    LOS: 4 days   Sherri Sear, MD  06/06/2020, 3:46 PM   Note: This dictation was prepared with Dragon dictation along with smaller phrase technology. Any transcriptional errors that result from this process are unintentional.

## 2020-06-05 NOTE — Progress Notes (Signed)
OT Cancellation Note  Patient Details Name: Stephanie [name] MRN: [id number] DOB: 1956/01/19   Cancelled Treatment:    Reason Eval/Treat Not Completed: Medical issues which prohibited therapy. OT orders received. Per chart review pt is not medically stable, with Hgb of 5.7 g/dL. Will hold therapy intervention until pt medically appropriate.   Matthew Folks, OTR/L [facility] [phone]

## 2020-06-05 NOTE — Progress Notes (Addendum)
Progress Note    Stephanie [name]  BTD:[id number] DOB: 1955/07/02  DOA: 06/01/2020 PCP: Pcp, No      Brief Narrative:    Medical records reviewed and are as summarized below:  Stephanie [name] is a 65 y.o. female with medical history significant for hypertension,AKI, chronic diastolic CHF, hyperlipidemia, recently discharged from the hospital on 05/29/2020 after hospitalization for COVID-19 infection and acute stroke, history of mandibular dislocation requiring closed reduction on 05/23/2020.  She presented to the hospital because of intractable nausea and vomiting.      Assessment/Plan:   Principal Problem:   Intractable vomiting with nausea Active Problems:   COVID-19   Dehydration   Primary hypertension   AKI (acute kidney injury) (HCC)   Gastroenteritis due to COVID-19 virus   Acute kidney injury (HCC)   Severe anemia  Vitamin B12 deficiency anemia (B12 level was 111 on 06/02/2020) Folate deficiency (folate level was 5.6 on 06/02/2020)   Nutrition Problem: Inadequate oral intake Etiology: decreased appetite,nausea,vomiting  Signs/Symptoms: per patient/family report   Body mass index is 22.31 kg/m.     PLAN    S/p closed reduction mandibular dislocation on 06/03/2020.  Antiemetics as needed for nausea/vomiting.   Hemoglobin dropped to 5.7.  Repeat hemoglobin was 5.8.  Transfuse 1 unit of packed red blood cells for severe anemia and monitor H&H.  Risks, benefits and alternatives of blood transfusion were discussed with the patient she agrees to proceed with blood transfusion. Start vitamin B12 injection for B12 anemia Start folic acid for folic acid deficiency  Consulted gastroenterologist for further evaluation of severe anemia.  Hold aspirin and Plavix for recent stroke because of severe anemia.  Continue amlodipine for hypertension  PT and OT for generalized weakness.  Recent COVID-19 infection (diagnosed on 05/17/2020)    Diet Order             DIET - DYS 1 Room service appropriate? Yes; Fluid consistency: Thin  Diet effective now                    Consultants:  ENT surgeon  Procedures:  Closed reduction of mandibular dislocation on 06/03/2020    Medications:   . (feeding supplement) PROSource Plus  30 mL Oral TID BM  . amLODipine  10 mg Oral Daily  . atorvastatin  40 mg Oral q1800  . cyanocobalamin  1,000 mcg Subcutaneous Daily  . docusate sodium  100 mg Oral BID  . feeding supplement  1 Container Oral TID WC  . [START ON 06/06/2020] folic acid  1 mg Oral Daily  . Ipratropium-Albuterol  1 puff Inhalation Q6H  . multivitamin with minerals  1 tablet Oral Daily   Continuous Infusions:    Anti-infectives (From admission, onward)   None             Family Communication/Anticipated D/C date and plan/Code Status   DVT prophylaxis: Place and maintain sequential compression device Start: 06/05/20 1453 Place TED hose Start: 06/01/20 2212  Lovenox has been discontinued because of severe anemia.    Code Status: Full Code  Family Communication: None Disposition Plan:    Status is: Inpatient  Remains inpatient appropriate because:Unsafe d/c plan   Dispo: The patient is from: Home              Anticipated d/c is to: Home              Anticipated d/c date is: 1 day  Patient currently is not medically stable to d/c.           Subjective:   Interval events noted.  She complains of generalized weakness.  No shortness of breath, palpitations or chest pain.  No bloody stools or vomiting blood.  Objective:    Vitals:   06/05/20 0802 06/05/20 1138 06/05/20 1218 06/05/20 1222  BP: 127/66 132/62 122/70 133/63  Pulse: 97 87 80 82  Resp:  16 16 16   Temp:  98.3 F (36.8 C) 98.2 F (36.8 C) 98.4 F (36.9 C)  TempSrc:  Oral Oral Oral  SpO2:   95% 98%  Weight:      Height:       No data found.   Intake/Output Summary (Last 24 hours) at 06/05/2020 1453 Last data filed at  06/05/2020 1222 Gross per 24 hour  Intake 535 ml  Output --  Net 535 ml   Filed Weights   06/01/20 1930 06/03/20 1126 06/04/20 1436  Weight: 67.1 kg 67.1 kg 74.6 kg    Exam:   GEN: NAD SKIN: Warm and dry EYES: EOMI ENT: MMM CV: RRR PULM: CTA B ABD: soft, ND, NT, +BS CNS: AAO x 3, non focal EXT: No edema or tenderness     Data Reviewed:   I have personally reviewed following labs and imaging studies:  Labs: Labs show the following:   Basic Metabolic Panel: Recent Labs  Lab 06/01/20 1939 06/02/20 0405 06/05/20 0600  NA 142 141 139  K 3.9 4.5 3.6  CL 108 110 111  CO2 23 24 24   GLUCOSE 145* 115* 84  BUN 62* 55* 22  CREATININE 1.40* 1.19* 0.95  CALCIUM 10.0 9.8 8.7*   GFR Estimated Creatinine Clearance: 69.9 mL/min (by C-G formula based on SCr of 0.95 mg/dL). Liver Function Tests: No results for input(s): AST, ALT, ALKPHOS, BILITOT, PROT, ALBUMIN in the last 168 hours. No results for input(s): LIPASE, AMYLASE in the last 168 hours. No results for input(s): AMMONIA in the last 168 hours. Coagulation profile No results for input(s): INR, PROTIME in the last 168 hours.  CBC: Recent Labs  Lab 06/01/20 1939 06/02/20 0405 06/05/20 0600 06/05/20 0836  WBC 13.6* 13.6* 6.8  --   NEUTROABS  --   --  4.8  --   HGB 9.3* 8.9* 5.7* 5.8*  HCT 28.0* 26.2* 17.3* 17.8*  MCV 86.7 86.2 87.8  --   PLT 154 158 107*  --    Cardiac Enzymes: No results for input(s): CKTOTAL, CKMB, CKMBINDEX, TROPONINI in the last 168 hours. BNP (last 3 results) No results for input(s): PROBNP in the last 8760 hours. CBG: Recent Labs  Lab 06/01/20 1947 06/03/20 2256  GLUCAP 125* 110*   D-Dimer: No results for input(s): DDIMER in the last 72 hours. Hgb A1c: No results for input(s): HGBA1C in the last 72 hours. Lipid Profile: No results for input(s): CHOL, HDL, LDLCALC, TRIG, CHOLHDL, LDLDIRECT in the last 72 hours. Thyroid function studies: No results for input(s): TSH,  T4TOTAL, T3FREE, THYROIDAB in the last 72 hours.  Invalid input(s): FREET3 Anemia work up: No results for input(s): VITAMINB12, FOLATE, FERRITIN, TIBC, IRON, RETICCTPCT in the last 72 hours. Sepsis Labs: Recent Labs  Lab 06/01/20 1939 06/01/20 2015 06/02/20 0405 06/05/20 0600  WBC 13.6*  --  13.6* 6.8  LATICACIDVEN  --  1.9  --   --     Microbiology Recent Results (from the past 240 hour(s))  Blood culture (single)  Status: None (Preliminary result)   Collection Time: 06/02/20  1:44 AM   Specimen: BLOOD  Result Value Ref Range Status   Specimen Description BLOOD RIGHT [facility]  Final   Special Requests   Final    BOTTLES DRAWN AEROBIC AND ANAEROBIC Blood Culture adequate volume   Culture   Final    NO GROWTH 3 DAYS Performed at [facility], [address], [place], [place] [address]    Report Status PENDING  Incomplete    Procedures and diagnostic studies:  No results found.             LOS: 3 days   Jeson Camacho  [facility]   Pager on [email or web address]. If 7PM-7AM, please contact night-coverage at [email or web address]     06/05/2020, 2:53 PM

## 2020-06-06 DIAGNOSIS — D519 Vitamin B12 deficiency anemia, unspecified: Secondary | ICD-10-CM

## 2020-06-06 DIAGNOSIS — D529 Folate deficiency anemia, unspecified: Secondary | ICD-10-CM

## 2020-06-06 LAB — CBC WITH DIFFERENTIAL/PLATELET
Abs Immature Granulocytes: 0.05 10*3/uL (ref 0.00–0.07)
Basophils Absolute: 0 10*3/uL (ref 0.0–0.1)
Basophils Relative: 0 %
Eosinophils Absolute: 0.1 10*3/uL (ref 0.0–0.5)
Eosinophils Relative: 2 %
HCT: 20.5 % — ABNORMAL LOW (ref 36.0–46.0)
Hemoglobin: 7 g/dL — ABNORMAL LOW (ref 12.0–15.0)
Immature Granulocytes: 1 %
Lymphocytes Relative: 17 %
Lymphs Abs: 1.4 10*3/uL (ref 0.7–4.0)
MCH: 29.7 pg (ref 26.0–34.0)
MCHC: 34.1 g/dL (ref 30.0–36.0)
MCV: 86.9 fL (ref 80.0–100.0)
Monocytes Absolute: 0.9 10*3/uL (ref 0.1–1.0)
Monocytes Relative: 11 %
Neutro Abs: 6 10*3/uL (ref 1.7–7.7)
Neutrophils Relative %: 69 %
Platelets: 125 10*3/uL — ABNORMAL LOW (ref 150–400)
RBC: 2.36 MIL/uL — ABNORMAL LOW (ref 3.87–5.11)
RDW: 13.9 % (ref 11.5–15.5)
WBC: 8.5 10*3/uL (ref 4.0–10.5)
nRBC: 0.2 % (ref 0.0–0.2)

## 2020-06-06 LAB — PREPARE RBC (CROSSMATCH)

## 2020-06-06 MED ORDER — ASPIRIN EC 81 MG PO TBEC
81.0000 mg | DELAYED_RELEASE_TABLET | Freq: Every day | ORAL | Status: DC
  Administered 2020-06-08 – 2020-06-17 (×9): 81 mg via ORAL
  Filled 2020-06-06 (×9): qty 1

## 2020-06-06 MED ORDER — PANTOPRAZOLE SODIUM 40 MG PO TBEC
40.0000 mg | DELAYED_RELEASE_TABLET | Freq: Two times a day (BID) | ORAL | Status: DC
  Administered 2020-06-07 – 2020-06-17 (×19): 40 mg via ORAL
  Filled 2020-06-06 (×20): qty 1

## 2020-06-06 MED ORDER — SODIUM CHLORIDE 0.9% IV SOLUTION: Freq: Once | INTRAVENOUS | Status: DC

## 2020-06-06 NOTE — Progress Notes (Addendum)
Progress Note    Stephanie [name]  XTK:[id number] DOB: November 17, 1955  DOA: 06/01/2020 PCP: Pcp, No      Brief Narrative:    Medical records reviewed and are as summarized below:  Stephanie [name] is a 65 y.o. female with medical history significant for hypertension,AKI, chronic diastolic CHF, hyperlipidemia, recently discharged from the hospital on 05/29/2020 after hospitalization for COVID-19 infection and acute stroke, history of mandibular dislocation requiring closed reduction on 05/23/2020.  She presented to the hospital because of intractable nausea and vomiting.      Assessment/Plan:   Principal Problem:   Intractable vomiting with nausea Active Problems:   COVID-19   Dehydration   Primary hypertension   AKI (acute kidney injury) (HCC)   Gastroenteritis due to COVID-19 virus   Acute kidney injury (HCC)   Severe anemia  Vitamin B12 deficiency anemia (B12 level was 111 on 06/02/2020) Folate deficiency (folate level was 5.6 on 06/02/2020)   Nutrition Problem: Inadequate oral intake Etiology: decreased appetite,nausea,vomiting  Signs/Symptoms: per patient/family report   Body mass index is 22.31 kg/m.     PLAN    S/p closed reduction mandibular dislocation on 06/03/2020.  Antiemetics as needed for nausea/vomiting.   Hemoglobin improved from 5.8-7.0 s/p transfusion with 1 unit of packed red blood cells on 06/05/2020.  Transfuse another unit of packed red blood cells because of generalized weakness and history of stroke.  Monitor H&H closely.  Continue vitamin B12 and folic acid  Follow-up with gastroenterologist for further recommendations.  Aspirin and Plavix have been held because of severe anemia.  Continue amlodipine for hypertension  PT and OT recommend discharge to SNF.  Recent COVID-19 infection (diagnosed on 05/17/2020)  Case was discussed with ENT surgeon, Dr. Jenne Campus and gastroenterologist, Dr. Allegra Lai via three-way secure group chat.  Dr. Jenne Campus  thinks that patient is at high risk for anesthesia because of recurrent mandibular dislocation.  Dr. Allegra Lai does not want to proceed with EGD at this time because of increased risk for anesthesia and is recommending outpatient follow-up.  This was discussed with neurologist, Dr. Derry Lory (via phone), who said that both aspirin and Plavix can be discontinued at this time because her recent stroke is from small vessel disease.  He also said that if it is okay with GI team patient could restart with aspirin monotherapy.  Okay to resume aspirin and start high-dose PPI per Dr. Allegra Lai, gastroenetrologist    Diet Order            DIET - DYS 1 Room service appropriate? Yes; Fluid consistency: Thin  Diet effective now                    Consultants:  ENT surgeon  Gastroenterologist  Procedures:  Closed reduction of mandibular dislocation on 06/03/2020    Medications:   . sodium chloride   Intravenous Once  . amLODipine  10 mg Oral Daily  . atorvastatin  40 mg Oral q1800  . cyanocobalamin  1,000 mcg Subcutaneous Daily  . docusate sodium  100 mg Oral BID  . folic acid  1 mg Oral Daily  . Ipratropium-Albuterol  1 puff Inhalation Q6H  . multivitamin with minerals  1 tablet Oral Daily   Continuous Infusions:    Anti-infectives (From admission, onward)   None             Family Communication/Anticipated D/C date and plan/Code Status   DVT prophylaxis: Place and maintain sequential compression device Start: 06/05/20  1453 Place TED hose Start: 06/01/20 2212  Lovenox has been discontinued because of severe anemia.    Code Status: Full Code  Family Communication: None Disposition Plan:    Status is: Inpatient  Remains inpatient appropriate because:Unsafe d/c plan   Dispo: The patient is from: Home              Anticipated d/c is to: SNF              Anticipated d/c date is: 1 day              Patient currently is not medically stable to  d/c.           Subjective:   Interval events noted.  No abdominal pain, hematemesis, bloody stools.  However, she remembered that she had black stools about a month ago.  Objective:    Vitals:   06/06/20 0848 06/06/20 1105 06/06/20 1232 06/06/20 1306  BP: 134/68 131/68 132/66 129/74  Pulse: 88 64 89 73  Resp: 18 20 14 14   Temp: 98.4 F (36.9 C) 98.4 F (36.9 C) 98 F (36.7 C) 98 F (36.7 C)  TempSrc: Oral Oral Oral Oral  SpO2: 100% 97% 96% 96%  Weight:      Height:       No data found.   Intake/Output Summary (Last 24 hours) at 06/06/2020 1347 Last data filed at 06/06/2020 1306 Gross per 24 hour  Intake 406 ml  Output 475 ml  Net -69 ml   Filed Weights   06/01/20 1930 06/03/20 1126 06/04/20 1436  Weight: 67.1 kg 67.1 kg 74.6 kg    Exam:   GEN: NAD SKIN: No rash EYES: EOMI ENT: MMM CV: RRR PULM: CTA B ABD: soft, ND, NT, +BS CNS: AAO x 3, non focal EXT: No edema or tenderness      Data Reviewed:   I have personally reviewed following labs and imaging studies:  Labs: Labs show the following:   Basic Metabolic Panel: Recent Labs  Lab 06/01/20 1939 06/02/20 0405 06/05/20 0600  NA 142 141 139  K 3.9 4.5 3.6  CL 108 110 111  CO2 23 24 24   GLUCOSE 145* 115* 84  BUN 62* 55* 22  CREATININE 1.40* 1.19* 0.95  CALCIUM 10.0 9.8 8.7*   GFR Estimated Creatinine Clearance: 69.9 mL/min (by C-G formula based on SCr of 0.95 mg/dL). Liver Function Tests: No results for input(s): AST, ALT, ALKPHOS, BILITOT, PROT, ALBUMIN in the last 168 hours. No results for input(s): LIPASE, AMYLASE in the last 168 hours. No results for input(s): AMMONIA in the last 168 hours. Coagulation profile No results for input(s): INR, PROTIME in the last 168 hours.  CBC: Recent Labs  Lab 06/01/20 1939 06/02/20 0405 06/05/20 0600 06/05/20 0836 06/06/20 0318  WBC 13.6* 13.6* 6.8  --  8.5  NEUTROABS  --   --  4.8  --  6.0  HGB 9.3* 8.9* 5.7* 5.8* 7.0*  HCT 28.0*  26.2* 17.3* 17.8* 20.5*  MCV 86.7 86.2 87.8  --  86.9  PLT 154 158 107*  --  125*   Cardiac Enzymes: No results for input(s): CKTOTAL, CKMB, CKMBINDEX, TROPONINI in the last 168 hours. BNP (last 3 results) No results for input(s): PROBNP in the last 8760 hours. CBG: Recent Labs  Lab 06/01/20 1947 06/03/20 2256  GLUCAP 125* 110*   D-Dimer: No results for input(s): DDIMER in the last 72 hours. Hgb A1c: No results for input(s): HGBA1C in the last  72 hours. Lipid Profile: No results for input(s): CHOL, HDL, LDLCALC, TRIG, CHOLHDL, LDLDIRECT in the last 72 hours. Thyroid function studies: No results for input(s): TSH, T4TOTAL, T3FREE, THYROIDAB in the last 72 hours.  Invalid input(s): FREET3 Anemia work up: No results for input(s): VITAMINB12, FOLATE, FERRITIN, TIBC, IRON, RETICCTPCT in the last 72 hours. Sepsis Labs: Recent Labs  Lab 06/01/20 1939 06/01/20 2015 06/02/20 0405 06/05/20 0600 06/06/20 0318  WBC 13.6*  --  13.6* 6.8 8.5  LATICACIDVEN  --  1.9  --   --   --     Microbiology Recent Results (from the past 240 hour(s))  Blood culture (single)     Status: None (Preliminary result)   Collection Time: 06/02/20  1:44 AM   Specimen: BLOOD  Result Value Ref Range Status   Specimen Description BLOOD RIGHT [facility]  Final   Special Requests   Final    BOTTLES DRAWN AEROBIC AND ANAEROBIC Blood Culture adequate volume   Culture   Final    NO GROWTH 4 DAYS Performed at [facility], [address], [place], [place] [address]    Report Status PENDING  Incomplete    Procedures and diagnostic studies:  No results found.             LOS: 4 days   Stephanie [name]  [facility]   Pager on [email or web address]. If 7PM-7AM, please contact night-coverage at [email or web address]     06/06/2020, 1:47 PM

## 2020-06-06 NOTE — Progress Notes (Signed)
Nutrition Follow-up  DOCUMENTATION CODES:   Not applicable  INTERVENTION:  Will discontinue Boost Breeze and PROSource Plus per patient request.  Provide Magic cup TID with meals, each supplement provides 290 kcal and 9 grams of protein.  Continue MVI po daily.  NUTRITION DIAGNOSIS:   Inadequate oral intake related to decreased appetite,nausea,vomiting as evidenced by per patient/family report.  Resolving per patient report.  GOAL:   Patient will meet greater than or equal to 90% of their needs  Progressing.  MONITOR:   PO intake,Diet advancement,Supplement acceptance,Labs,Weight trends,I & O's  REASON FOR ASSESSMENT:   Malnutrition Screening Tool    ASSESSMENT:   64 year old female with PMHx of DM, recent acute CVA, GI bleed, COVID-19 infection, recent bilateral mandibular dislocation s/p closed reduction of mandibular dislocation bilaterally on 05/23/2020 who is admitted with intractable N/V due to COVID-19 gastroenteritis, AKI, and recurrent dislocated jaw s/p closed reduction of mandibular dislocation on 06/03/2020.  1/20 diet advanced to dysphagia 1 with thin liquids  Met with patient at bedside. She reports she mostly had liquids yesterday but reports today she has had Magic Cup and is going to have more off her trays. She does not like Boost Breeze or PROSource Plus and would like these discontinued. RD recommended other ONS on formulary but patient declines at this time. She would only like Magic Cup with meals and enjoys all 3 flavors. Encouraged adequate intake of calories and protein at meals.   Medications reviewed and include: vitamin B12 1000 micrograms daily, Colace 100 mg BID, folic acid 1 mg daily, MVI daily. Patient refused all her meds this AM per documentation in chart.  Labs reviewed.  I/O: 475 mL UOP yesterday (0.3 mL/kg/hr)  Diet Order:   Diet Order            DIET - DYS 1 Room service appropriate? Yes; Fluid consistency: Thin  Diet effective now                 EDUCATION NEEDS:   No education needs have been identified at this time  Skin:  Skin Assessment: Skin Integrity Issues: Skin Integrity Issues:: Incisions Incisions: closed incision to lip  Last BM:  06/04/2020 - smear type 6  Height:   Ht Readings from Last 1 Encounters:  06/03/20 6' (1.829 m)   Weight:   Wt Readings from Last 1 Encounters:  06/04/20 74.6 kg   BMI:  Body mass index is 22.31 kg/m.  Estimated Nutritional Needs:   Kcal:  1900-2100  Protein:  95-105 grams  Fluid:  >/= 2.2 L/day  Leanne King, MS, RD, LDN Pager number available on Amion 

## 2020-06-06 NOTE — Evaluation (Signed)
Occupational Therapy Evaluation Patient Details Name: Stephanie [name] MRN: [id number] DOB: [birth year] Today's Date: 06/06/2020    History of Present Illness Patient is a 65 year old female who presents for nausea and vomiting. 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 Discharged from hospital on 05/29/20. Per documentation patient had a fall cleaning a toilet at work with anterior dislocation of mandiublar conydles which required closed reduction 05/23/20. Received transfusion while hospitalized.   Clinical Impression   Patient presenting with decreased I in self care, balance, functional mobility/transfer, endurance, and safety awareness.  Patient is poor historian but is known to this therapist. Prior to recent hospitalization with covid pt was independent and working full time PTA. She lives with 2 disabled brothers and information confirmed via family on phone. Pt discharged from hospital with aunt but per chart she is unable to return there. Pt needs 24/7 supervision at discharge secondary to safety. Patient currently functioning with min A but evaluation limited secondary to pt refusal for OOB tasks. Patient will benefit from acute OT to increase overall independence in the areas of ADLs, functional mobility, and safety awareness in order to safely discharge to next venue of care.    Follow Up Recommendations  SNF    Equipment Recommendations  3 in 1 bedside commode;Other (comment) (RW)       Precautions / Restrictions Precautions Precautions: Fall Restrictions Weight Bearing Restrictions: No      Mobility Bed Mobility Overal bed mobility: Needs Assistance Bed Mobility: Rolling Rolling: Supervision   Supine to sit: Supervision;HOB elevated Sit to supine: Supervision;HOB elevated   General bed mobility comments: no physical assist to reposition self in bed    Transfers Overall  transfer level: Needs assistance Equipment used: Rolling walker (2 wheeled) Transfers: Sit to/from [employer] Sit to Stand: Min guard;Min assist Stand pivot transfers: Min assist;Min guard       General transfer comment: Pt refusal    Balance Overall balance assessment: Needs assistance Sitting-balance support: Feet supported;No upper extremity supported Sitting balance-Leahy Scale: Good Sitting balance - Comments: able to sit EOB and wash face when provided with equipment, posterior trunk lean with LE movement. Postural control: Posterior lean Standing balance support: Single extremity supported;Bilateral upper extremity supported;During functional activity Standing balance-Leahy Scale: Fair Standing balance comment: Requires BUE support with ambulation. Able to static stand with SUE support while performing toileting however requires close CGA and set up.                           ADL either performed or assessed with clinical judgement   ADL Overall ADL's : Needs assistance/impaired     Grooming: Wash/dry hands;Wash/dry face;Oral care;Set up;Sitting;Supervision/safety                                 General ADL Comments: Pt refusal to perform any other further self care tasks or functional transfers     Vision Patient Visual Report: No change from baseline              Pertinent Vitals/Pain Pain Assessment: Faces Faces Pain Scale: No hurt     Hand Dominance Right   Extremity/Trunk Assessment Upper Extremity Assessment Upper Extremity Assessment: Generalized weakness;Overall [facility] for tasks assessed   Lower Extremity Assessment Lower Extremity Assessment: Defer to PT evaluation RLE  Deficits / Details: grossly 3+/5 however cognition does limit full range of testing RLE Sensation: WNL RLE Coordination: WNL LLE Deficits / Details: hip grossly 3/5 knee 3+/5, ankle 3+/5 ; limited slightly by cognition LLE Sensation: decreased  light touch (on lateral aspect of Lower Leg and foot.) LLE Coordination: decreased gross motor   Cervical / Trunk Assessment Cervical / Trunk Assessment: Normal   Communication Communication Communication: No difficulties   Cognition Arousal/Alertness: Awake/alert Behavior During Therapy: WFL for tasks assessed/performed Overall Cognitive Status: No family/caregiver present to determine baseline cognitive functioning Area of Impairment: Problem solving;Safety/judgement;Following commands                       Following Commands: Follows one step commands with increased time Safety/Judgement: Decreased awareness of safety Awareness: Emergent Problem Solving: Slow processing General Comments: min cuing   General Comments  patient has bruising on L forearm and scattered across limbs.    Exercises Other Exercises Other Exercises: Patient educated on role of PT in acute care setting, safe mobility and transfers, toileting and ADL performance, STS from varying height surfaces, safe use of RW        Home Living Family/patient expects to be discharged to:: Private residence Living Arrangements: Other relatives   Type of Home: House Home Access: Stairs to enter Secretary/administrator of Steps: 3 Entrance Stairs-Rails: Left;Right;Can reach both Home Layout: One level     Bathroom Shower/Tub: Chief Strategy Officer: Standard Bathroom Accessibility: Yes   Home Equipment: Shower seat   Additional Comments: Pt recently discharged from hospital to family member's home and they were unable to assist her. 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 Pt lives with brothers who are disabled and unable to assist her at discharge.      Prior Functioning/Environment Level of Independence: Needs assistance  Gait / Transfers Assistance Needed: Prior to hospitalizations pt  was independent. ADL's / Homemaking Assistance Needed: Patient requires assistance for ADLs per previous documentation, patient unreliable historian.   Comments: Patient is an unreliable historian. Per previous documentation she lives with family members (aunt vs. brother) who are unable to assist in caring for her.        OT Problem List: Decreased strength;Pain;Decreased activity tolerance;Decreased safety awareness;Impaired balance (sitting and/or standing);Decreased knowledge of use of DME or AE;Decreased coordination;Decreased cognition      OT Treatment/Interventions: Self-care/ADL training;Balance training;Therapeutic exercise;Therapeutic activities;Energy conservation;Cognitive remediation/compensation;DME and/or AE instruction;Patient/family education;Neuromuscular education    OT Goals(Current goals can be found in the care plan section) Acute Rehab OT Goals Patient Stated Goal: to get stronger and get out of the hospital OT Goal Formulation: With patient Time For Goal Achievement: 06/20/20 Potential to Achieve Goals: Good ADL Goals Pt Will Perform Grooming: with supervision;standing Pt Will Perform Lower Body Dressing: with supervision;sit to/from stand Pt Will Transfer to Toilet: with supervision;ambulating Pt Will Perform Toileting - Clothing Manipulation and hygiene: with supervision;sit to/from stand  OT Frequency: Min 1X/week   Barriers to D/C: Decreased caregiver support             AM-PAC OT "6 Clicks" Daily Activity     Outcome Measure Help from another person eating meals?: A Little Help from another person taking care of personal grooming?: A Little Help from another person toileting, which includes using toliet, bedpan, or urinal?: A Little Help from another person  bathing (including washing, rinsing, drying)?: A Little Help from another person to put on and taking off regular upper body clothing?: A Little Help from another person to put on and taking off  regular lower body clothing?: A Little 6 Click Score: 18   End of Session Nurse Communication: Mobility status  Activity Tolerance: Other (comment) (self limiting) Patient left: in bed;with call bell/phone within reach;with bed alarm set  OT Visit Diagnosis: Unsteadiness on feet (R26.81);Repeated falls (R29.6);Muscle weakness (generalized) (M62.81)                Time: 1093-2355 OT Time Calculation (min): 14 min Charges:  OT General Charges $OT Visit: 1 Visit OT Evaluation $OT Eval Low Complexity: 1 Low  Jackquline Denmark, MS, OTR/L , CBIS ascom [phone]  06/06/20, 1:14 PM

## 2020-06-06 NOTE — Evaluation (Signed)
Physical Therapy Evaluation Patient Details Name: Stephanie [name] MRN: [id number] DOB: 1956-05-15 Today's Date: 06/06/2020   History of Present Illness  Patient is a 65 year old female who presents for nausea and vomiting. 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 Discharged from hospital on 05/29/20. Per documentation patient had a fall cleaning a toilet at work with anterior dislocation of mandiublar conydles which required closed reduction 05/23/20. Received transfusion while hospitalized.    Clinical Impression  Patient is a pleasant 65 year old female who presents with generalized weakness, limited mobility, and poor safety. Prior to hospital admission, pt was recently discharged from hospital and returned home shortly only to return with family reporting they are unable to help/assist her.  At this time patient has no current home/location to return to and is unsafe to return to living alone.  Patient is able to perform bed mobility with supervision and use of UE's. Upon sitting EOB patient is able to wash her face with a warm wash cloth provided to her but does have posterior trunk movement with LE movement. STS requires min A and cueing for hand placement. She ambulates with RW to restroom however frequently requires min A for safe use of AD and negotiation of room. She performs toileting and requires set up for self clean and close CGA due to near lateral LOB with SUE support. Ambulated to chair, reported having to utilize restroom again performed forward step and SPT to [facility] where she was unable to void. Ambulated back to bed and returned to supine position with needs met. Patient has limited safety awareness and is unsafe for mobility without assistance. Pt would benefit from skilled PT to address noted impairments and functional limitations (see below for any additional details).  Upon hospital discharge,  pt would benefit from SNF due to high fall risk and limited ability for ADL performance.      Follow Up Recommendations SNF    Equipment Recommendations  Rolling walker with 5" wheels    Recommendations for Other Services       Precautions / Restrictions Precautions Precautions: Fall Restrictions Weight Bearing Restrictions: No      Mobility  Bed Mobility Overal bed mobility: Needs Assistance Bed Mobility: Supine to Sit;Sit to Supine     Supine to sit: Supervision;HOB elevated Sit to supine: Supervision;HOB elevated   General bed mobility comments: Requires use of UE's and additional time to perform. supervision for safety required.    Transfers Overall transfer level: Needs assistance Equipment used: Rolling walker (2 wheeled) Transfers: Sit to/from [employer] Sit to Stand: Min guard;Min assist Stand pivot transfers: Min assist;Min guard       General transfer comment: Patient requires cueing for hand placement. Min A for STS from bed and toilet, CGA from recliner.  Ambulation/Gait Ambulation/Gait assistance: Min guard;Min assist Gait Distance (Feet): 40 Feet Assistive device: Rolling walker (2 wheeled) Gait Pattern/deviations: Step-through pattern;Decreased stride length;Shuffle;Drifts right/left;Narrow base of support Gait velocity: decreased   General Gait Details: Patient requires min A for stabilization on RW due to episodic lifting of RW while attempting to negotiate room.  Stairs            Wheelchair Mobility    Modified Rankin (Stroke Patients Only)       Balance Overall balance assessment: Needs assistance Sitting-balance support: Feet supported;No upper extremity supported Sitting balance-Leahy Scale: Good Sitting balance - Comments: able to sit  EOB and wash face when provided with equipment, posterior trunk lean with LE movement. Postural control: Posterior lean Standing balance support: Single extremity  supported;Bilateral upper extremity supported;During functional activity Standing balance-Leahy Scale: Fair Standing balance comment: Requires BUE support with ambulation. Able to static stand with SUE support while performing toileting however requires close CGA and set up.                             Pertinent Vitals/Pain Pain Assessment: No/denies pain    Home Living Family/patient expects to be discharged to:: Unsure (Patient's family unable to provide care, unclear if patient has home to return to at this time.)                 Additional Comments: Patient recently discharged from hospital, patient's family unable to provide care for patient and patient will need to seek alternative placement options.    Prior Function Level of Independence: Needs assistance   Gait / Transfers Assistance Needed: CGA/Min A for transition. Patient is poor historian, reports she wasn't home long enough to be able to know what help she needed.  ADL's / Homemaking Assistance Needed: Patient requires assistance for ADLs per previous documentation, patient unreliable historian.  Comments: Patient is an unreliable historian. Per previous documentation she lives with family members (aunt vs. brother) who are unable to assist in caring for her.     Hand Dominance   Dominant Hand: Right    Extremity/Trunk Assessment   Upper Extremity Assessment Upper Extremity Assessment: Defer to OT evaluation    Lower Extremity Assessment Lower Extremity Assessment: Difficult to assess due to impaired cognition;RLE deficits/detail;LLE deficits/detail RLE Deficits / Details: grossly 3+/5 however cognition does limit full range of testing RLE Sensation: WNL RLE Coordination: WNL LLE Deficits / Details: hip grossly 3/5 knee 3+/5, ankle 3+/5 ; limited slightly by cognition LLE Sensation: decreased light touch (on lateral aspect of Lower Leg and foot.) LLE Coordination: decreased gross motor     Cervical / Trunk Assessment Cervical / Trunk Assessment: Normal  Communication   Communication: No difficulties  Cognition Arousal/Alertness: Awake/alert Behavior During Therapy: WFL for tasks assessed/performed Overall Cognitive Status: No family/caregiver present to determine baseline cognitive functioning (Patient provide conflicting answers/poor historian/poor safety awareness)                                        General Comments General comments (skin integrity, edema, etc.): patient has bruising on L forearm and scattered across limbs.    Exercises Other Exercises Other Exercises: Patient educated on role of PT in acute care setting, safe mobility and transfers, toileting and ADL performance, STS from varying height surfaces, safe use of RW   Assessment/Plan    PT Assessment Patient needs continued PT services  PT Problem List Decreased strength;Decreased activity tolerance;Decreased balance;Decreased knowledge of use of DME;Decreased cognition;Decreased mobility;Decreased safety awareness       PT Treatment Interventions DME instruction;Gait training;Stair training;Functional mobility training;Therapeutic activities;Therapeutic exercise;Balance training;Neuromuscular re-education;Patient/family education;Cognitive remediation    PT Goals (Current goals can be found in the Care Plan section)  Acute Rehab PT Goals Patient Stated Goal: to get stronger and get out of the hospital PT Goal Formulation: With patient Time For Goal Achievement: 06/20/20 Potential to Achieve Goals: Fair    Frequency Min 2X/week   Barriers to discharge Decreased caregiver support;Inaccessible  home environment Patient does not have a location for discharge at this time, nor does she have any caregiver options.    Co-evaluation               AM-PAC PT "6 Clicks" Mobility  Outcome Measure Help needed turning from your back to your side while in a flat bed without using  bedrails?: A Little Help needed moving from lying on your back to sitting on the side of a flat bed without using bedrails?: A Little Help needed moving to and from a bed to a chair (including a wheelchair)?: A Little Help needed standing up from a chair using your arms (e.g., wheelchair or bedside chair)?: A Little Help needed to walk in hospital room?: A Little Help needed climbing 3-5 steps with a railing? : A Lot 6 Click Score: 17    End of Session Equipment Utilized During Treatment: Gait belt Activity Tolerance: Patient tolerated treatment well;Patient limited by fatigue Patient left: in bed;with call bell/phone within reach;with bed alarm set Nurse Communication: Mobility status PT Visit Diagnosis: Unsteadiness on feet (R26.81);Other abnormalities of gait and mobility (R26.89);Muscle weakness (generalized) (M62.81);History of falling (Z91.81);Difficulty in walking, not elsewhere classified (R26.2)    Time: 0936-1000 PT Time Calculation (min) (ACUTE ONLY): 24 min   Charges:   PT Evaluation $PT Eval Moderate Complexity: 1 Mod PT Treatments $Therapeutic Activity: 8-22 mins       Janna Arch, PT, DPT   06/06/2020, 10:42 AM

## 2020-06-07 LAB — CBC WITH DIFFERENTIAL/PLATELET
Abs Immature Granulocytes: 0.05 10*3/uL (ref 0.00–0.07)
Basophils Absolute: 0 10*3/uL (ref 0.0–0.1)
Basophils Relative: 0 %
Eosinophils Absolute: 0.2 10*3/uL (ref 0.0–0.5)
Eosinophils Relative: 2 %
HCT: 23.8 % — ABNORMAL LOW (ref 36.0–46.0)
Hemoglobin: 8.5 g/dL — ABNORMAL LOW (ref 12.0–15.0)
Immature Granulocytes: 1 %
Lymphocytes Relative: 14 %
Lymphs Abs: 1.1 10*3/uL (ref 0.7–4.0)
MCH: 30.1 pg (ref 26.0–34.0)
MCHC: 35.7 g/dL (ref 30.0–36.0)
MCV: 84.4 fL (ref 80.0–100.0)
Monocytes Absolute: 0.9 10*3/uL (ref 0.1–1.0)
Monocytes Relative: 11 %
Neutro Abs: 5.6 10*3/uL (ref 1.7–7.7)
Neutrophils Relative %: 72 %
Platelets: 141 10*3/uL — ABNORMAL LOW (ref 150–400)
RBC: 2.82 MIL/uL — ABNORMAL LOW (ref 3.87–5.11)
RDW: 14.5 % (ref 11.5–15.5)
WBC: 7.8 10*3/uL (ref 4.0–10.5)
nRBC: 0.3 % — ABNORMAL HIGH (ref 0.0–0.2)

## 2020-06-07 LAB — CULTURE, BLOOD (SINGLE)
Culture: NO GROWTH
Special Requests: ADEQUATE

## 2020-06-07 LAB — VITAMIN D 25 HYDROXY (VIT D DEFICIENCY, FRACTURES): Vit D, 25-Hydroxy: 11.28 ng/mL — ABNORMAL LOW (ref 30–100)

## 2020-06-07 MED ORDER — BISACODYL 5 MG PO TBEC: 10.0000 mg | DELAYED_RELEASE_TABLET | Freq: Every day | ORAL | Status: DC | PRN

## 2020-06-07 MED ORDER — SIMETHICONE 80 MG PO CHEW: 80.0000 mg | CHEWABLE_TABLET | Freq: Four times a day (QID) | ORAL | Status: DC

## 2020-06-07 MED ORDER — SIMETHICONE 80 MG PO CHEW
80.0000 mg | CHEWABLE_TABLET | Freq: Four times a day (QID) | ORAL | Status: DC
  Administered 2020-06-07 – 2020-06-17 (×28): 80 mg via ORAL
  Filled 2020-06-07 (×43): qty 1

## 2020-06-07 MED ORDER — POLYETHYLENE GLYCOL 3350 17 G PO PACK: 17.0000 g | PACK | Freq: Every day | ORAL | Status: DC

## 2020-06-07 MED ORDER — BISACODYL 10 MG RE SUPP: 10.0000 mg | Freq: Every day | RECTAL | Status: DC | PRN

## 2020-06-07 NOTE — Progress Notes (Signed)
[facility] Progress Note  Patient: Stephanie [name]    NAT:[id number]  DOA: 06/01/2020     Date of Service: the patient was seen and examined on 06/07/2020  Chief Complaint  Patient presents with  . Fatigue  . Covid Positive   Brief hospital course: Medical records reviewed and are as summarized below:  Stephanie [name] is a 65 y.o. female with medical history significant for hypertension,AKI, chronic diastolic CHF, hyperlipidemia, recently discharged from the hospital on 05/29/2020 after hospitalization for COVID-19 infection and acute stroke, history of mandibular dislocation requiring closed reduction on 05/23/2020.  She presented to the hospital because of intractable nausea and vomiting.    Assessment and Plan: Principal Problem:   Intractable vomiting with nausea Active Problems:   COVID-19   Dehydration   Primary hypertension   AKI (acute kidney injury) (HCC)   Gastroenteritis due to COVID-19 virus   Acute kidney injury (HCC)   Severe anemia       Anemia Hemoglobin improved from 5.8-7.0 s/p transfusion with 1 unit of packed red blood cells on 06/05/2020.  Transfuse another unit of packed red blood cells because of generalized weakness and history of stroke.  Monitor H&H closely. Follow-up with gastroenterologist for further recommendations. Aspirin and Plavix have been held because of severe anemia. Case was discussed with ENT surgeon, Dr. Jenne Campus and gastroenterologist, Dr. Allegra Lai via three-way secure group chat.  Dr. Jenne Campus thinks that patient is at high risk for anesthesia because of recurrent mandibular dislocation.  Dr. Allegra Lai does not want to proceed with EGD at this time because of increased risk for anesthesia and is recommending outpatient follow-up.  This was discussed with neurologist, Dr. Derry Lory (via phone), who said that both aspirin and Plavix can be discontinued at this time because her recent stroke is from small vessel disease.  He also said that if  it is okay with GI team patient could restart with aspirin monotherapy.  Okay to resume aspirin and start high-dose PPI per Dr. Allegra Lai, gastroenetrologist Follow FOBT   S/p closed reduction mandibular dislocation on 06/03/2020.  Antiemetics as needed for nausea/vomiting.   Hypertension, continue amlodipine   Vitamin B12 deficiency anemia (B12 level was 111 on 06/02/2020) Folate deficiency (folate level was 5.6 on 06/02/2020) Continue vitamin B12 and folic acid  GERD dyspepsia, continue PPI p.o. twice daily, started simethicone  Recent COVID-19 infection (diagnosed on 05/17/2020)  Nutrition Problem: Inadequate oral intake Etiology: decreased appetite,nausea,vomiting Signs/Symptoms: per patient/family report   Body mass index is 22.31 kg/m.  Nutrition Problem: Inadequate oral intake Etiology: decreased appetite,nausea,vomiting Interventions: Interventions: Boost Breeze,Prostat,MVI     PT and OT recommend discharge to SNF.   Consultants:  ENT surgeon  Gastroenterologist  Procedures:  Closed reduction of mandibular dislocation on 06/03/2020  Diet: Dysphagia 1 DVT Prophylaxis: SCD, pharmacological prophylaxis contraindicated due to Risk of bleeding   Advance goals of care discussion: Full code  Family Communication: family was not present at bedside, at the time of interview.  The pt provided permission to discuss medical plan with the family. Opportunity was given to ask question and all questions were answered satisfactorily.   Disposition:  Pt is from Home, admitted with nausea and vomiting, found to have anemia, still has anemia but patient is improving and awaiting for bed placement at SNF  Subjective: No significant overnight events, patient was complaining of feeling off and on abdominal pain, indigestion and dyspepsia, patient did not remember when she had last bowel movement.  Denies nausea vomiting Patient  denied any chest pain or shortness of breath, no  palpitations  Physical Exam: General:  alert oriented to time, place, and person.  Appear in mild distress, affect appropriate Eyes: PERRLA ENT: Oral Mucosa Clear, moist  Neck: no JVD,  Cardiovascular: S1 and S2 Present, no Murmur,  Respiratory: good respiratory effort, Bilateral Air entry equal and Decreased, no Crackles, no wheezes Abdomen: Bowel Sound present, Soft and mo tenderness,  Skin: no rashes Extremities: no Pedal edema, no calf tenderness Neurologic: without any new focal findings Gait not checked due to patient safety concerns  Vitals:   06/06/20 2327 06/07/20 0518 06/07/20 0735 06/07/20 0812  BP: (!) 142/67 (!) 157/78 (!) 145/71 (!) 151/86  Pulse: 67 69 78 76  Resp:    17  Temp: 98.4 F (36.9 C) 97.6 F (36.4 C)  98.3 F (36.8 C)  TempSrc: Oral Oral  Oral  SpO2: 100% 100% 98% 99%  Weight:      Height:        Intake/Output Summary (Last 24 hours) at 06/07/2020 1347 Last data filed at 06/06/2020 1600 Gross per 24 hour  Intake 348 ml  Output -  Net 348 ml   Filed Weights   06/01/20 1930 06/03/20 1126 06/04/20 1436  Weight: 67.1 kg 67.1 kg 74.6 kg    Data Reviewed: I have personally reviewed and interpreted daily labs, tele strips, imagings as discussed above. I reviewed all nursing notes, pharmacy notes, vitals, pertinent old records I have discussed plan of care as described above with RN and patient/family.  CBC: Recent Labs  Lab 06/01/20 1939 06/02/20 0405 06/05/20 0600 06/05/20 0836 06/06/20 0318 06/07/20 0610  WBC 13.6* 13.6* 6.8  --  8.5 7.8  NEUTROABS  --   --  4.8  --  6.0 5.6  HGB 9.3* 8.9* 5.7* 5.8* 7.0* 8.5*  HCT 28.0* 26.2* 17.3* 17.8* 20.5* 23.8*  MCV 86.7 86.2 87.8  --  86.9 84.4  PLT 154 158 107*  --  125* 141*   Basic Metabolic Panel: Recent Labs  Lab 06/01/20 1939 06/02/20 0405 06/05/20 0600  NA 142 141 139  K 3.9 4.5 3.6  CL 108 110 111  CO2 23 24 24   GLUCOSE 145* 115* 84  BUN 62* 55* 22  CREATININE 1.40* 1.19*  0.95  CALCIUM 10.0 9.8 8.7*    Studies: No results found.  Scheduled Meds: . sodium chloride   Intravenous Once  . amLODipine  10 mg Oral Daily  . aspirin EC  81 mg Oral Daily  . atorvastatin  40 mg Oral q1800  . cyanocobalamin  1,000 mcg Subcutaneous Daily  . docusate sodium  100 mg Oral BID  . folic acid  1 mg Oral Daily  . Ipratropium-Albuterol  1 puff Inhalation Q6H  . multivitamin with minerals  1 tablet Oral Daily  . pantoprazole  40 mg Oral BID   Continuous Infusions: PRN Meds: acetaminophen **OR** acetaminophen, bisacodyl, hydrALAZINE, ondansetron **OR** ondansetron (ZOFRAN) IV  Time spent: 35 minutes  Author: . MD [facility] 06/07/2020 1:47 PM  To reach On-call, see care teams to locate the attending and reach out to them via [email or web address]. If 7PM-7AM, please contact night-coverage If you still have difficulty reaching the attending provider, please page the [facility] (Director on Call) for [facility] on amion for assistance.

## 2020-06-07 NOTE — Progress Notes (Signed)
Patient continues to try to get out of bed to use bedside commode knowing that she has had a fall before coming in. We have told patient several times that she needs to call to get out of bed.

## 2020-06-08 LAB — BPAM RBC
Blood Product Expiration Date: 202201262359
Blood Product Expiration Date: 202202182359
ISSUE DATE / TIME: 202201201203
ISSUE DATE / TIME: 202201211241
Unit Type and Rh: 5100
Unit Type and Rh: 9500

## 2020-06-08 LAB — TYPE AND SCREEN
ABO/RH(D): O POS
Antibody Screen: NEGATIVE
Unit division: 0
Unit division: 0

## 2020-06-08 LAB — CBC
HCT: 23.8 % — ABNORMAL LOW (ref 36.0–46.0)
Hemoglobin: 8 g/dL — ABNORMAL LOW (ref 12.0–15.0)
MCH: 29.4 pg (ref 26.0–34.0)
MCHC: 33.6 g/dL (ref 30.0–36.0)
MCV: 87.5 fL (ref 80.0–100.0)
Platelets: 148 10*3/uL — ABNORMAL LOW (ref 150–400)
RBC: 2.72 MIL/uL — ABNORMAL LOW (ref 3.87–5.11)
RDW: 14.5 % (ref 11.5–15.5)
WBC: 7.7 10*3/uL (ref 4.0–10.5)
nRBC: 0 % (ref 0.0–0.2)

## 2020-06-08 LAB — BASIC METABOLIC PANEL
Anion gap: 7 (ref 5–15)
BUN: 9 mg/dL (ref 8–23)
CO2: 25 mmol/L (ref 22–32)
Calcium: 9.3 mg/dL (ref 8.9–10.3)
Chloride: 106 mmol/L (ref 98–111)
Creatinine, Ser: 0.88 mg/dL (ref 0.44–1.00)
GFR, Estimated: >60 mL/min (ref >60)
Glucose, Bld: 87 mg/dL (ref 70–99)
Potassium: 3.2 mmol/L — ABNORMAL LOW (ref 3.5–5.1)
Sodium: 138 mmol/L (ref 135–145)

## 2020-06-08 LAB — PHOSPHORUS: Phosphorus: 2.2 mg/dL — ABNORMAL LOW (ref 2.5–4.6)

## 2020-06-08 LAB — MAGNESIUM: Magnesium: 1.7 mg/dL (ref 1.7–2.4)

## 2020-06-08 MED ORDER — VITAMIN D (ERGOCALCIFEROL) 1.25 MG (50000 UNIT) PO CAPS
50000.0000 [IU] | ORAL_CAPSULE | ORAL | Status: DC
  Administered 2020-06-08 – 2020-06-15 (×2): 50000 [IU] via ORAL
  Filled 2020-06-08 (×2): qty 1

## 2020-06-08 MED ORDER — POLYETHYLENE GLYCOL 3350 17 G PO PACK
17.0000 g | PACK | Freq: Every day | ORAL | Status: DC
  Administered 2020-06-08 – 2020-06-17 (×7): 17 g via ORAL
  Filled 2020-06-08 (×8): qty 1

## 2020-06-08 MED ORDER — BISACODYL 10 MG RE SUPP
10.0000 mg | Freq: Once | RECTAL | Status: AC
  Administered 2020-06-08: 10 mg via RECTAL
  Filled 2020-06-08: qty 1

## 2020-06-08 MED ORDER — MAGNESIUM OXIDE 400 (241.3 MG) MG PO TABS
400.0000 mg | ORAL_TABLET | Freq: Two times a day (BID) | ORAL | Status: AC
  Administered 2020-06-08 – 2020-06-09 (×4): 400 mg via ORAL
  Filled 2020-06-08 (×4): qty 1

## 2020-06-08 MED ORDER — BISACODYL 5 MG PO TBEC
10.0000 mg | DELAYED_RELEASE_TABLET | Freq: Every evening | ORAL | Status: DC
  Administered 2020-06-08 – 2020-06-16 (×8): 10 mg via ORAL
  Filled 2020-06-08 (×9): qty 2

## 2020-06-08 MED ORDER — MINERAL OIL RE ENEM: 1.0000 | ENEMA | Freq: Once | RECTAL | Status: DC

## 2020-06-08 MED ORDER — K PHOS MONO-SOD PHOS DI & MONO 155-852-130 MG PO TABS
500.0000 mg | ORAL_TABLET | Freq: Three times a day (TID) | ORAL | Status: AC
  Administered 2020-06-08 (×3): 500 mg via ORAL
  Filled 2020-06-08 (×3): qty 2

## 2020-06-08 NOTE — Progress Notes (Signed)
[facility] Progress Note  Patient: Stephanie [name]    BTD:[id number]  DOA: 06/01/2020     Date of Service: the patient was seen and examined on 06/08/2020  Chief Complaint  Patient presents with  . Fatigue  . Covid Positive   Brief hospital course: Medical records reviewed and are as summarized below:  Stephanie [name] is a 65 y.o. female with medical history significant for hypertension,AKI, chronic diastolic CHF, hyperlipidemia, recently discharged from the hospital on 05/29/2020 after hospitalization for COVID-19 infection and acute stroke, history of mandibular dislocation requiring closed reduction on 05/23/2020.  She presented to the hospital because of intractable nausea and vomiting.    Assessment and Plan: Principal Problem:   Intractable vomiting with nausea Active Problems:   COVID-19   Dehydration   Primary hypertension   AKI (acute kidney injury) (HCC)   Gastroenteritis due to COVID-19 virus   Acute kidney injury (HCC)   Severe anemia       Anemia Hemoglobin improved from 5.8-7.0 s/p transfusion with 1 unit of packed red blood cells on 06/05/2020.  Transfuse another unit of packed red blood cells because of generalized weakness and history of stroke.  Monitor H&H closely. Follow-up with gastroenterologist for further recommendations. Aspirin and Plavix have been held because of severe anemia. Case was discussed with ENT surgeon, Dr. Jenne Campus and gastroenterologist, Dr. Allegra Lai via three-way secure group chat.  Dr. Jenne Campus thinks that patient is at high risk for anesthesia because of recurrent mandibular dislocation.  Dr. Allegra Lai does not want to proceed with EGD at this time because of increased risk for anesthesia and is recommending outpatient follow-up.  This was discussed with neurologist, Dr. Derry Lory (via phone), who said that both aspirin and Plavix can be discontinued at this time because her recent stroke is from small vessel disease.  He also said that if  it is okay with GI team patient could restart with aspirin monotherapy.  Okay to resume aspirin and start high-dose PPI per Dr. Allegra Lai, gastroenetrologist Follow FOBT   S/p closed reduction mandibular dislocation on 06/03/2020.  Antiemetics as needed for nausea/vomiting.   Hypertension, continue amlodipine   Vitamin B12 deficiency anemia (B12 level was 111 on 06/02/2020) Folate deficiency (folate level was 5.6 on 06/02/2020) Continue vitamin B12 and folic acid  GERD dyspepsia, continue PPI p.o. twice daily, started simethicone  Recent COVID-19 infection (diagnosed on 05/17/2020)  Hypokalemia, potassium repleted.  Monitor and replete as needed. Hypophosphatemia, phosphorus repleted.  Continue to monitor and replete as needed. Hypomagnesemia, mag repleted orally.  Monitor and replete as needed.   Constipation, as per patient she uses enema at home And MiraLAX daily, Dulcolax 10 mg p.o. nightly 1/23 Dulcolax suppository once today, use mineral oil enema in the evening if no BM after suppository F/u BM  Vitamin D deficiency, started vitamin D 50,000 units p.o. weekly for total 8 weeks.  Follow with PCP to repeat vitamin D level after 3 to 6 months.   Body mass index is 22.31 kg/m.  Nutrition Problem: Inadequate oral intake Etiology: decreased appetite,nausea,vomiting Signs/Symptoms: per patient/family report Interventions: Boost Breeze,Prostat,MVI     PT and OT recommend discharge to SNF.   Consultants:  ENT surgeon  Gastroenterologist  Neurology via phone call  Procedures:  Closed reduction of mandibular dislocation on 06/03/2020  Diet: Dysphagia 1 DVT Prophylaxis: SCD, pharmacological prophylaxis contraindicated due to Risk of bleeding   Advance goals of care discussion: Full code  Family Communication: family was not present at bedside, at  the time of interview.  The pt provided permission to discuss medical plan with the family. Opportunity was given to ask  question and all questions were answered satisfactorily.   Disposition:  Pt is from Home, admitted with nausea and vomiting, found to have anemia, still has anemia but patient is improving and awaiting for bed placement at SNF  Subjective: No significant overnight events, patient still having mild cramps in the abdomen, no BM yet.  Patient stated that she has been using enema at home for constipation and she is requesting something stronger or enema during hospitalization. Started about treatment for constipation. Patient denied any other active issues.   Physical Exam: General:  alert oriented to time, place, and person.  Appear in mild distress, affect appropriate Eyes: PERRLA ENT: Oral Mucosa Clear, moist  Neck: no JVD,  Cardiovascular: S1 and S2 Present, no Murmur,  Respiratory: good respiratory effort, Bilateral Air entry equal and Decreased, no Crackles, no wheezes Abdomen: Bowel Sound present, Soft and mo tenderness,  Skin: no rashes Extremities: no Pedal edema, no calf tenderness Neurologic: without any new focal findings Gait not checked due to patient safety concerns  Vitals:   06/07/20 2009 06/08/20 0029 06/08/20 0445 06/08/20 0901  BP: (!) 153/91 (!) 149/87 (!) 141/80 140/75  Pulse: 72 85 99 (!) 105  Resp: 18 18 18 16   Temp: 98.5 F (36.9 C) 98.7 F (37.1 C) 98.4 F (36.9 C) 98.2 F (36.8 C)  TempSrc: Oral Oral Oral   SpO2: 98% 100% 100% 99%  Weight:      Height:        Intake/Output Summary (Last 24 hours) at 06/08/2020 1132 Last data filed at 06/08/2020 1012 Gross per 24 hour  Intake 120 ml  Output -  Net 120 ml   Filed Weights   06/01/20 1930 06/03/20 1126 06/04/20 1436  Weight: 67.1 kg 67.1 kg 74.6 kg    Data Reviewed: I have personally reviewed and interpreted daily labs, tele strips, imagings as discussed above. I reviewed all nursing notes, pharmacy notes, vitals, pertinent old records I have discussed plan of care as described above with RN and  patient/family.  CBC: Recent Labs  Lab 06/02/20 0405 06/05/20 0600 06/05/20 0836 06/06/20 0318 06/07/20 0610 06/08/20 0549  WBC 13.6* 6.8  --  8.5 7.8 7.7  NEUTROABS  --  4.8  --  6.0 5.6  --   HGB 8.9* 5.7* 5.8* 7.0* 8.5* 8.0*  HCT 26.2* 17.3* 17.8* 20.5* 23.8* 23.8*  MCV 86.2 87.8  --  86.9 84.4 87.5  PLT 158 107*  --  125* 141* 148*   Basic Metabolic Panel: Recent Labs  Lab 06/01/20 1939 06/02/20 0405 06/05/20 0600 06/08/20 0549  NA 142 141 139 138  K 3.9 4.5 3.6 3.2*  CL 108 110 111 106  CO2 23 24 24 25   GLUCOSE 145* 115* 84 87  BUN 62* 55* 22 9  CREATININE 1.40* 1.19* 0.95 0.88  CALCIUM 10.0 9.8 8.7* 9.3  MG  --   --   --  1.7  PHOS  --   --   --  2.2*    Studies: No results found.  Scheduled Meds: . sodium chloride   Intravenous Once  . amLODipine  10 mg Oral Daily  . aspirin EC  81 mg Oral Daily  . atorvastatin  40 mg Oral q1800  . bisacodyl  10 mg Oral QPM  . cyanocobalamin  1,000 mcg Subcutaneous Daily  . docusate sodium  100 mg Oral BID  . folic acid  1 mg Oral Daily  . Ipratropium-Albuterol  1 puff Inhalation Q6H  . magnesium oxide  400 mg Oral BID  . multivitamin with minerals  1 tablet Oral Daily  . pantoprazole  40 mg Oral BID  . phosphorus  500 mg Oral TID  . polyethylene glycol  17 g Oral Daily  . simethicone  80 mg Oral QID  . Vitamin D (Ergocalciferol)  50,000 Units Oral Q7 days   Continuous Infusions: PRN Meds: acetaminophen **OR** acetaminophen, bisacodyl, hydrALAZINE, ondansetron **OR** ondansetron (ZOFRAN) IV  Time spent: 35 minutes  Author: Gillis Santa. MD [facility] 06/08/2020 11:32 AM  To reach On-call, see care teams to locate the attending and reach out to them via [email or web address]. If 7PM-7AM, please contact night-coverage If you still have difficulty reaching the attending provider, please page the [facility] (Director on Call) for [facility] on amion for assistance.

## 2020-06-08 NOTE — Anesthesia Postprocedure Evaluation (Signed)
Anesthesia Post Note  Patient: Stephanie [name]  Procedure(s) Performed: CLOSED REDUCTION MANDIBULAR (N/A )  Patient location during evaluation: PACU Anesthesia Type: General Level of consciousness: awake and alert and oriented Pain management: pain level controlled Vital Signs Assessment: post-procedure vital signs reviewed and stable Respiratory status: spontaneous breathing Cardiovascular status: blood pressure returned to baseline Anesthetic complications: no   No complications documented.   Last Vitals:  Vitals:   06/08/20 1241 06/08/20 1555  BP: 127/77 125/71  Pulse: 99 89  Resp: 16 16  Temp: 36.8 C 36.8 C  SpO2: 100%     Last Pain:  Vitals:   06/08/20 1241  TempSrc:   PainSc: 0-No pain                 Isis Costanza

## 2020-06-08 NOTE — Plan of Care (Signed)
  Problem: Education: Goal: Knowledge of General Education information will improve Description: Including pain rating scale, medication(s)/side effects and non-pharmacologic comfort measures 06/08/2020 1525 by Ansel Bong, RN Outcome: Progressing 06/08/2020 1525 by Ansel Bong, RN Outcome: Progressing   Problem: Health Behavior/Discharge Planning: Goal: Ability to manage health-related needs will improve 06/08/2020 1525 by Ansel Bong, RN Outcome: Progressing 06/08/2020 1525 by Ansel Bong, RN Outcome: Progressing   Problem: Clinical Measurements: Goal: Ability to maintain clinical measurements within normal limits will improve 06/08/2020 1525 by Ansel Bong, RN Outcome: Progressing 06/08/2020 1525 by Ansel Bong, RN Outcome: Progressing Goal: Will remain free from infection 06/08/2020 1525 by Ansel Bong, RN Outcome: Progressing 06/08/2020 1525 by Ansel Bong, RN Outcome: Progressing Goal: Diagnostic test results will improve 06/08/2020 1525 by Ansel Bong, RN Outcome: Progressing 06/08/2020 1525 by Ansel Bong, RN Outcome: Progressing Goal: Respiratory complications will improve 06/08/2020 1525 by Ansel Bong, RN Outcome: Progressing 06/08/2020 1525 by Ansel Bong, RN Outcome: Progressing Goal: Cardiovascular complication will be avoided 06/08/2020 1525 by Ansel Bong, RN Outcome: Progressing 06/08/2020 1525 by Ansel Bong, RN Outcome: Progressing   Problem: Activity: Goal: Risk for activity intolerance will decrease 06/08/2020 1525 by Ansel Bong, RN Outcome: Progressing 06/08/2020 1525 by Ansel Bong, RN Outcome: Progressing   Problem: Nutrition: Goal: Adequate nutrition will be maintained 06/08/2020 1525 by Ansel Bong, RN Outcome: Progressing 06/08/2020 1525 by Ansel Bong, RN Outcome: Progressing   Problem: Coping: Goal: Level of anxiety will decrease 06/08/2020 1525 by Ansel Bong, RN Outcome:  Progressing 06/08/2020 1525 by Ansel Bong, RN Outcome: Progressing   Problem: Elimination: Goal: Will not experience complications related to bowel motility 06/08/2020 1525 by Ansel Bong, RN Outcome: Progressing 06/08/2020 1525 by Ansel Bong, RN Outcome: Progressing Goal: Will not experience complications related to urinary retention 06/08/2020 1525 by Ansel Bong, RN Outcome: Progressing 06/08/2020 1525 by Ansel Bong, RN Outcome: Progressing   Problem: Pain Managment: Goal: General experience of comfort will improve 06/08/2020 1525 by Ansel Bong, RN Outcome: Progressing 06/08/2020 1525 by Ansel Bong, RN Outcome: Progressing   Problem: Safety: Goal: Ability to remain free from injury will improve 06/08/2020 1525 by Ansel Bong, RN Outcome: Progressing 06/08/2020 1525 by Ansel Bong, RN Outcome: Progressing   Problem: Skin Integrity: Goal: Risk for impaired skin integrity will decrease 06/08/2020 1525 by Ansel Bong, RN Outcome: Progressing 06/08/2020 1525 by Ansel Bong, RN Outcome: Progressing

## 2020-06-09 ENCOUNTER — Encounter: Payer: Self-pay | Admitting: Internal Medicine

## 2020-06-09 ENCOUNTER — Encounter: Payer: Self-pay | Admitting: Gastroenterology

## 2020-06-09 ENCOUNTER — Inpatient Hospital Stay: Payer: HRSA Program

## 2020-06-09 ENCOUNTER — Telehealth: Payer: Self-pay | Admitting: Gastroenterology

## 2020-06-09 DIAGNOSIS — D638 Anemia in other chronic diseases classified elsewhere: Secondary | ICD-10-CM

## 2020-06-09 LAB — BASIC METABOLIC PANEL
Anion gap: 9 (ref 5–15)
BUN: 8 mg/dL (ref 8–23)
CO2: 27 mmol/L (ref 22–32)
Calcium: 8.9 mg/dL (ref 8.9–10.3)
Chloride: 102 mmol/L (ref 98–111)
Creatinine, Ser: 0.86 mg/dL (ref 0.44–1.00)
GFR, Estimated: >60 mL/min (ref >60)
Glucose, Bld: 103 mg/dL — ABNORMAL HIGH (ref 70–99)
Potassium: 3 mmol/L — ABNORMAL LOW (ref 3.5–5.1)
Sodium: 138 mmol/L (ref 135–145)

## 2020-06-09 LAB — VITAMIN B12: Vitamin B-12: 6678 pg/mL — ABNORMAL HIGH (ref 180–914)

## 2020-06-09 LAB — H PYLORI, IGM, IGG, IGA AB
H Pylori IgG: 2.58 Index Value — ABNORMAL HIGH (ref 0.00–0.79)
H. Pylogi, Iga Abs: 31.2 units — ABNORMAL HIGH (ref 0.0–8.9)
H. Pylogi, Igm Abs: <9 units (ref 0.0–8.9)

## 2020-06-09 LAB — CBC
HCT: 23.2 % — ABNORMAL LOW (ref 36.0–46.0)
Hemoglobin: 8 g/dL — ABNORMAL LOW (ref 12.0–15.0)
MCH: 29.5 pg (ref 26.0–34.0)
MCHC: 34.5 g/dL (ref 30.0–36.0)
MCV: 85.6 fL (ref 80.0–100.0)
Platelets: 158 10*3/uL (ref 150–400)
RBC: 2.71 MIL/uL — ABNORMAL LOW (ref 3.87–5.11)
RDW: 14.9 % (ref 11.5–15.5)
WBC: 5.7 10*3/uL (ref 4.0–10.5)
nRBC: 0 % (ref 0.0–0.2)

## 2020-06-09 LAB — MAGNESIUM: Magnesium: 1.7 mg/dL (ref 1.7–2.4)

## 2020-06-09 LAB — FOLATE: Folate: 6.7 ng/mL (ref >5.9)

## 2020-06-09 LAB — FERRITIN: Ferritin: 211 ng/mL (ref 11–307)

## 2020-06-09 LAB — PHOSPHORUS: Phosphorus: 4.2 mg/dL (ref 2.5–4.6)

## 2020-06-09 MED ORDER — IOHEXOL 9 MG/ML PO SOLN
500.0000 mL | ORAL | Status: AC
  Administered 2020-06-09 (×2): 500 mL via ORAL

## 2020-06-09 MED ORDER — IOHEXOL 300 MG/ML  SOLN
100.0000 mL | Freq: Once | INTRAMUSCULAR | Status: AC | PRN
  Administered 2020-06-09: 100 mL via INTRAVENOUS

## 2020-06-09 MED ORDER — POTASSIUM CHLORIDE CRYS ER 20 MEQ PO TBCR
40.0000 meq | EXTENDED_RELEASE_TABLET | Freq: Four times a day (QID) | ORAL | Status: AC
Start: 2020-06-09 — End: 2020-06-09
  Administered 2020-06-09 (×3): 40 meq via ORAL
  Filled 2020-06-09 (×3): qty 2

## 2020-06-09 MED ORDER — SODIUM CHLORIDE 0.9 % IV SOLN: INTRAVENOUS | Status: DC

## 2020-06-09 MED ORDER — MAGNESIUM CITRATE PO SOLN
1.0000 | Freq: Once | ORAL | Status: AC
  Administered 2020-06-09: 1 via ORAL
  Filled 2020-06-09: qty 296

## 2020-06-09 MED ORDER — POLYETHYLENE GLYCOL 3350 17 GM/SCOOP PO POWD
1.0000 | Freq: Once | ORAL | Status: DC
  Filled 2020-06-09: qty 255

## 2020-06-09 NOTE — Progress Notes (Signed)
Stephanie Repress, MD [address]  [address]  [place], [place] [address]  Main: [phone]  Fax: [phone] Pager: [phone]   Subjective: I was called by the hospitalist to readdress about the colonoscopy as patient and her family is requesting before discharge as she does not have insurance.  She is under charity care.  Patient denies any rectal bleeding.  Her hemoglobin has been stable.   Objective: Vital signs in last 24 hours: Vitals:   06/09/20 0443 06/09/20 0841 06/09/20 1108 06/09/20 1501  BP: (!) 145/83 135/87 106/68 129/75  Pulse: 80 79 78 79  Resp: 20 16 17 17   Temp: 98.5 F (36.9 C) 98.7 F (37.1 C) 98.3 F (36.8 C) 98.5 F (36.9 C)  TempSrc: Oral Oral    SpO2: 100% 100% 100% 98%  Weight:      Height:       Weight change:   Intake/Output Summary (Last 24 hours) at 06/09/2020 1629 Last data filed at 06/09/2020 1000 Gross per 24 hour  Intake 120 ml  Output --  Net 120 ml     Exam: Heart:: Regular rate and rhythm, S1S2 present or without murmur or extra heart sounds Lungs: normal and clear to auscultation Abdomen: soft, nontender, normal bowel sounds   Lab Results: CBC Latest Ref Rng & Units 06/09/2020 06/08/2020 06/07/2020  WBC 4.0 - 10.5 K/uL 5.7 7.7 7.8  Hemoglobin 12.0 - 15.0 g/dL 8.0(L) 8.0(L) 8.5(L)  Hematocrit 36.0 - 46.0 % 23.2(L) 23.8(L) 23.8(L)  Platelets 150 - 400 K/uL 158 148(L) 141(L)   CMP Latest Ref Rng & Units 06/09/2020 06/08/2020 06/05/2020  Glucose 70 - 99 mg/dL 06/07/2020) 87 84  BUN 8 - 23 mg/dL 8 9 22   Creatinine 0.44 - 1.00 mg/dL 924(Q 6.83  Sodium 135 - 145 mmol/L 138 138 139  Potassium 3.5 - 5.1 mmol/L 3.0(L) 3.2(L) 3.6  Chloride 98 - 111 mmol/L 102 106 111  CO2 22 - 32 mmol/L 27 25 24   Calcium 8.9 - 10.3 mg/dL 8.9 9.3 4.19)  Total Protein 6.5 - 8.1 g/dL - - -  Total Bilirubin 0.3 - 1.2 mg/dL - - -  Alkaline Phos 38 - 126 U/L - - -  AST 15 - 41 U/L - - -  ALT 0 - 44 U/L - - -    Micro Results: Recent  Results (from the past 240 hour(s))  Blood culture (single)     Status: None   Collection Time: 06/02/20  1:44 AM   Specimen: BLOOD  Result Value Ref Range Status   Specimen Description BLOOD RIGHT AC  Final   Special Requests   Final    BOTTLES DRAWN AEROBIC AND ANAEROBIC Blood Culture adequate volume   Culture   Final    NO GROWTH 5 DAYS Performed at [facility], [address] [place], [facility] [address]    Report Status 06/07/2020 FINAL  Final   Studies/Results: CT CHEST ABDOMEN PELVIS W CONTRAST  Result Date: 06/09/2020 CLINICAL DATA:  Unintended weight loss, recent COVID infection, recent stroke, constipation EXAM: CT CHEST, ABDOMEN, AND PELVIS WITH CONTRAST TECHNIQUE: Multidetector CT imaging of the chest, abdomen and pelvis was performed following the standard protocol during bolus administration of intravenous contrast. CONTRAST:  98921 OMNIPAQUE IOHEXOL 300 MG/ML SOLN, additional oral enteric contrast COMPARISON:  CT chest angiogram 05/19/2020 FINDINGS: CT CHEST FINDINGS Cardiovascular: Scattered aortic atherosclerosis. Normal heart size. No pericardial effusion. Mediastinum/Nodes: No enlarged mediastinal, hilar, or axillary lymph nodes. Thyroid gland,  trachea, and esophagus demonstrate no significant findings. Lungs/Pleura: Moderate centrilobular emphysema. Diffuse bilateral bronchial wall thickening. Interval improvement in superimposed ground-glass airspace disease, with some residual airspace disease and or scarring scattered throughout the lungs, particularly in the posterior upper lobes (series 4, image 51). No pleural effusion or pneumothorax. Musculoskeletal: No chest wall mass or suspicious bone lesions identified. CT ABDOMEN PELVIS FINDINGS Hepatobiliary: No solid liver abnormality is seen. No gallstones, gallbladder wall thickening, or biliary dilatation. Pancreas: Unremarkable. No pancreatic ductal dilatation or surrounding inflammatory changes. Spleen: Normal in  size without significant abnormality. Adrenals/Urinary Tract: Adrenal glands are unremarkable. Kidneys are normal, without renal calculi, solid lesion, or hydronephrosis. Bladder is unremarkable. Stomach/Bowel: Stomach is within normal limits. Appendix appears normal. Moderate burden of stool throughout the colon with stool balls in the rectum. There is mild rectal wall thickening and perirectal/presacral fat stranding (series 2, image 115). Vascular/Lymphatic: No significant vascular findings are present. No enlarged abdominal or pelvic lymph nodes. Reproductive: Status post hysterectomy. Other: No abdominal wall hernia or abnormality. No abdominopelvic ascites. Musculoskeletal: No acute or significant osseous findings. IMPRESSION: 1. Interval improvement in superimposed ground-glass airspace disease, with some residual airspace disease and or scarring scattered throughout the lungs, particularly in the posterior upper lobes. Findings are consistent with resolving airspace disease. Additional CT follow-up may be performed at 3-6 months to assess for degree of chronic underlying scarring or fibrosis if desired. 2. Emphysema. 3. Moderate burden of stool throughout the colon with stool balls in the rectum. There is mild rectal wall thickening and perirectal/presacral fat stranding, suggestive of stercoral colitis, although this appearance could also be seen with prior radiation therapy to the pelvis. Correlate with clinical history. 4. Emphysema. Aortic Atherosclerosis (ICD10-I70.0) and Emphysema (ICD10-J43.9). Electronically Signed   By: Lauralyn Primes M.D.   On: 06/09/2020 16:07   Medications:  I have reviewed the patient's current medications. Prior to Admission:  Medications Prior to Admission  Medication Sig Dispense Refill Last Dose  . amLODipine (NORVASC) 10 MG tablet Take 1 tablet (10 mg total) by mouth daily. 30 tablet 2   . aspirin EC 81 MG EC tablet Take 1 tablet (81 mg total) by mouth daily. Swallow  whole. 30 tablet 11   . atorvastatin (LIPITOR) 40 MG tablet Take 1 tablet (40 mg total) by mouth daily. 30 tablet 3   . clopidogrel (PLAVIX) 75 MG tablet Take 1 tablet (75 mg total) by mouth daily. 30 tablet 2   . hydrALAZINE (APRESOLINE) 25 MG tablet Take 1 tablet (25 mg total) by mouth 2 (two) times daily. 60 tablet 2   . Ipratropium-Albuterol (COMBIVENT) 20-100 MCG/ACT AERS respimat Inhale 1 puff into the lungs every 6 (six) hours. 1 each 0   . lisinopril (ZESTRIL) 5 MG tablet Take 1 tablet (5 mg total) by mouth daily. 30 tablet 2    Scheduled: . sodium chloride   Intravenous Once  . amLODipine  10 mg Oral Daily  . aspirin EC  81 mg Oral Daily  . atorvastatin  40 mg Oral q1800  . bisacodyl  10 mg Oral QPM  . cyanocobalamin  1,000 mcg Subcutaneous Daily  . docusate sodium  100 mg Oral BID  . folic acid  1 mg Oral Daily  . Ipratropium-Albuterol  1 puff Inhalation Q6H  . magnesium citrate  1 Bottle Oral Once  . magnesium oxide  400 mg Oral BID  . mineral oil  1 enema Rectal Once  . multivitamin with minerals  1 tablet Oral  Daily  . pantoprazole  40 mg Oral BID  . polyethylene glycol  17 g Oral Daily  . polyethylene glycol powder  1 Container Oral Once  . potassium chloride  40 mEq Oral Q6H  . simethicone  80 mg Oral QID  . Vitamin D (Ergocalciferol)  50,000 Units Oral Q7 days   Continuous: . sodium chloride     [id number] **OR** acetaminophen, bisacodyl, hydrALAZINE, ondansetron **OR** ondansetron (ZOFRAN) IV Anti-infectives (From admission, onward)   None     Scheduled Meds: . sodium chloride   Intravenous Once  . amLODipine  10 mg Oral Daily  . aspirin EC  81 mg Oral Daily  . atorvastatin  40 mg Oral q1800  . bisacodyl  10 mg Oral QPM  . cyanocobalamin  1,000 mcg Subcutaneous Daily  . docusate sodium  100 mg Oral BID  . folic acid  1 mg Oral Daily  . Ipratropium-Albuterol  1 puff Inhalation Q6H  . magnesium citrate  1 Bottle Oral Once  . magnesium oxide  400 mg  Oral BID  . mineral oil  1 enema Rectal Once  . multivitamin with minerals  1 tablet Oral Daily  . pantoprazole  40 mg Oral BID  . polyethylene glycol  17 g Oral Daily  . polyethylene glycol powder  1 Container Oral Once  . potassium chloride  40 mEq Oral Q6H  . simethicone  80 mg Oral QID  . Vitamin D (Ergocalciferol)  50,000 Units Oral Q7 days   Continuous Infusions: . 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  Patient, also is not willing to undergo upper endoscopy due to concern for recurrence of mandibular dislocation Plavix has been discontinued since 1/20 Okay to continue aspirin 81 mg daily Clear liquid diet, n.p.o. effective 5 AM tomorrow Bowel prep today Tentative plan for colonoscopy tomorrow I have discussed alternative options, risks & benefits,  which include, but are not limited to, bleeding, infection, perforation,respiratory complication & drug reaction.  The patient agrees with this plan & written consent will be obtained.   Dr. Maximino Greenland will cover from tomorrow     LOS: 7 days   Jaime Grizzell 06/09/2020, 4:29 PM

## 2020-06-09 NOTE — TOC Initial Note (Signed)
Transition of Care [facility]) - Initial/Assessment Note    Patient Details  Name: UNIKA NAZARENO MRN: [id number] Date of Birth: [birth year]  [facility] of [facility]) CM/SW Contact:    Shelbie Ammons, RN Phone Number: 06/09/2020, 10:51 AM  Clinical Narrative:    RNCM met with patient and cousin Katharine Look in room. Patient resting in bed but alert and verbally responsive. Patient reports to feeling better today and feels that she is getting stronger. Discussed with patient current recommendations for SNF at discharge. Patient verbalizes understanding that she can't go back to her aunt's house at discharge due to her aunt's own health issues. Per patient and cousin Katharine Look she is agreeable to looking for a skilled nursing facility. Did make patient and cousin aware that there is no guarantee that placement will be found and that they will need to think about different plans otherwise. Marshell Levan verbalizing that there are no other available options for patient.    Expected Discharge Plan: Skilled Nursing Facility Barriers to Discharge: Financial Resources   Patient Goals and CMS Choice        Expected Discharge Plan and Services Expected Discharge Plan: [place]       Living arrangements for the past 2 months: Apartment Expected Discharge Date: 06/05/20                                    Prior Living Arrangements/Services Living arrangements for the past 2 months: Apartment Lives with:: Siblings Patient language and need for interpreter reviewed:: Yes Do you feel safe going back to the place where you live?: Yes      Need for Family Participation in Patient Care: Yes (Comment) Care giver support system in place?: Yes (comment)   Criminal Activity/Legal Involvement Pertinent to Current Situation/Hospitalization: No - Comment as needed  Activities of Daily Living Home Assistive Devices/Equipment: Walker (specify type),Cane (specify quad or straight) ADL  Screening (condition at time of admission) Patient's cognitive ability adequate to safely complete daily activities?: Yes Is the patient deaf or have difficulty hearing?: Yes (difficulty hearing at times) Does the patient have difficulty seeing, even when wearing glasses/contacts?: Yes Does the patient have difficulty concentrating, remembering, or making decisions?: No Patient able to express need for assistance with ADLs?: Yes Does the patient have difficulty dressing or bathing?: Yes Independently performs ADLs?: Yes (appropriate for developmental age) Communication: Independent Bathing: Independent Does the patient have difficulty walking or climbing stairs?: Yes Weakness of Legs: Both Weakness of Arms/Hands: Both  Permission Sought/Granted                  Emotional Assessment Appearance:: Appears stated age Attitude/Demeanor/Rapport: Engaged Affect (typically observed): Calm,Appropriate Orientation: : Oriented to Self,Oriented to Place,Oriented to  Time,Oriented to Situation Alcohol / Substance Use: Not Applicable Psych Involvement: No (comment)  Admission diagnosis:  Dehydration [E86.0] AKI (acute kidney injury) ([place]) [N17.9] Acute kidney injury ([place]) [N17.9] Hypotension, unspecified hypotension type [I95.9] Intractable vomiting with nausea [R11.2] Patient Active Problem List   Diagnosis Date Noted  . Severe anemia 06/05/2020  . Acute kidney injury ([place]) 06/02/2020  . Intractable vomiting with nausea 06/01/2020  . AKI (acute kidney injury) ([place]) 06/01/2020  . Gastroenteritis due to COVID-19 virus 06/01/2020  . Weakness   . Primary hypertension   . Acute respiratory failure with hypoxia ([place]) 05/19/2020  . COVID-19 05/18/2020  . Dehydration 05/18/2020  . Slurred speech 05/18/2020  .  Acute ischemic left ACA stroke ([place]) 05/18/2020   PCP:  Pcp, No Pharmacy:   [facility] [place] [address] [place] [address] Phone: [phone] Fax: [phone]     Social Determinants of Health (SDOH) Interventions    Readmission Risk Interventions No flowsheet data found.

## 2020-06-09 NOTE — Progress Notes (Addendum)
PT Cancellation Note  Patient Details Name: Stephanie [name] MRN: [id number] DOB: November 10, 1955   Cancelled Treatment:    Reason Eval/Treat Not Completed: Other (comment) MD cleared pt for participation in treatment in setting of low K+ (3.0). Attempted to see pt for PT treatment. Pt received in bed reporting she just returned from a test & declined participation in tx on this date. Pt reports she will participate tomorrow. Will f/u as able.   Aleda Grana, PT, DPT 06/09/20, 3:58 PM    Sandi Mariscal 06/09/2020, 3:57 PM

## 2020-06-09 NOTE — Progress Notes (Signed)
[facility] Progress Note  Patient: Stephanie [name]    MGQ:[id number]  DOA: 06/01/2020     Date of Service: the patient was seen and examined on 06/09/2020  Chief Complaint  Patient presents with  . Fatigue  . Covid Positive   Brief hospital course: Medical records reviewed and are as summarized below:  Stephanie [name] is a 65 y.o. female with medical history significant for hypertension,AKI, chronic diastolic CHF, hyperlipidemia, recently discharged from the hospital on 05/29/2020 after hospitalization for COVID-19 infection and acute stroke, history of mandibular dislocation requiring closed reduction on 05/23/2020.  She presented to the hospital because of intractable nausea and vomiting.    Assessment and Plan: Principal Problem:   Intractable vomiting with nausea Active Problems:   COVID-19   Dehydration   Primary hypertension   AKI (acute kidney injury) (HCC)   Gastroenteritis due to COVID-19 virus   Acute kidney injury (HCC)   Severe anemia       Anemia Hemoglobin improved from 5.8-7.0 s/p transfusion with 1 unit of packed red blood cells on 06/05/2020.  Transfuse another unit of packed red blood cells because of generalized weakness and history of stroke.  Monitor H&H closely. Follow-up with gastroenterologist for further recommendations. Aspirin and Plavix have been held because of severe anemia. Case was discussed with ENT surgeon, Dr. Jenne Campus and gastroenterologist, Dr. Allegra Lai via three-way secure group chat.  Dr. Jenne Campus thinks that patient is at high risk for anesthesia because of recurrent mandibular dislocation.  Dr. Allegra Lai does not want to proceed with EGD at this time because of increased risk for anesthesia and is recommending outpatient follow-up.  This was discussed with neurologist, Dr. Derry Lory (via phone), who said that both aspirin and Plavix can be discontinued at this time because her recent stroke is from small vessel disease.  He also said that if  it is okay with GI team patient could restart with aspirin monotherapy.  Okay to resume aspirin and start high-dose PPI per Dr. Allegra Lai, gastroenetrologist Follow FOBT 1/24 patient's daughter wanted at least colonoscopy to be done during hospital stay, discussed with GI, plan is to proceed with colonoscopy    Unintentional weight loss, patient's daughter wanted occult malignancy work-up Follow CT chest and abdomen and pelvis   S/p closed reduction mandibular dislocation on 06/03/2020.  Antiemetics as needed for nausea/vomiting.   Hypertension, continue amlodipine   Vitamin B12 deficiency anemia (B12 level was 111 on 06/02/2020) Folate deficiency (folate level was 5.6 on 06/02/2020) Continue vitamin B12 and folic acid  GERD dyspepsia, continue PPI p.o. twice daily, started simethicone  Recent COVID-19 infection (diagnosed on 05/17/2020)  Hypokalemia, potassium repleted.  Monitor and replete as needed. Hypophosphatemia, phosphorus repleted.  Continue to monitor and replete as needed. Hypomagnesemia, mag repleted orally.  Monitor and replete as needed.   Constipation, as per patient she uses enema at home And MiraLAX daily, Dulcolax 10 mg p.o. nightly 1/23 Dulcolax suppository once, and mineral oil enema in the evening given, and patient did not move bowels F/u BM daily  Vitamin D deficiency, started vitamin D 50,000 units p.o. weekly for total 8 weeks.  Follow with PCP to repeat vitamin D level after 3 to 6 months.   Body mass index is 22.31 kg/m.  Nutrition Problem: Inadequate oral intake Etiology: decreased appetite,nausea,vomiting Signs/Symptoms: per patient/family report Interventions: Boost Breeze,Prostat,MVI     PT and OT recommend discharge to SNF.   Consultants:  ENT surgeon  Gastroenterologist  Neurology via phone call  Procedures:  Closed reduction of mandibular dislocation on 06/03/2020  Diet: Dysphagia 1 DVT Prophylaxis: SCD, pharmacological  prophylaxis contraindicated due to Risk of bleeding   Advance goals of care discussion: Full code  Family Communication: family was present at bedside, at the time of interview.  The pt provided permission to discuss medical plan with the family. Opportunity was given to ask question and all questions were answered satisfactorily.   Disposition:  Pt is from Home, admitted with nausea and vomiting, found to have anemia, still has anemia but patient is improving and awaiting GI work-up colonoscopy and unintentional weight loss, follow work-up to rule out occult malignancy.   Patient can be discharged to SNF in 1 to 2 days when cleared by GI.     Subjective: No significant overnight events, patient did not move bowels last night after suppository and enema.  Today feels she is feeling much better.  Denies any other active issues. Daughter was at bedside and she was concerned about colonoscopy and unintentional weight loss.  Requested GI for colonoscopy and ordered CT scan chest abdomen pelvis to rule out occult malignancy.   Physical Exam: General:  alert oriented to time, place, and person.  Appear in mild distress, affect appropriate Eyes: PERRLA ENT: Oral Mucosa Clear, moist  Neck: no JVD,  Cardiovascular: S1 and S2 Present, no Murmur,  Respiratory: good respiratory effort, Bilateral Air entry equal and Decreased, no Crackles, no wheezes Abdomen: Bowel Sound present, Soft and mo tenderness,  Skin: no rashes Extremities: no Pedal edema, no calf tenderness Neurologic: without any new focal findings Gait not checked due to patient safety concerns  Vitals:   06/09/20 0051 06/09/20 0443 06/09/20 0841 06/09/20 1108  BP: 124/67 (!) 145/83 135/87 106/68  Pulse: 74 80 79 78  Resp: 18 20 16 17   Temp: 98.5 F (36.9 C) 98.5 F (36.9 C) 98.7 F (37.1 C) 98.3 F (36.8 C)  TempSrc: Oral Oral Oral   SpO2: 94% 100% 100% 100%  Weight:      Height:        Intake/Output Summary (Last 24  hours) at 06/09/2020 1421 Last data filed at 06/09/2020 1000 Gross per 24 hour  Intake 120 ml  Output -  Net 120 ml   Filed Weights   06/01/20 1930 06/03/20 1126 06/04/20 1436  Weight: 67.1 kg 67.1 kg 74.6 kg    Data Reviewed: I have personally reviewed and interpreted daily labs, tele strips, imagings as discussed above. I reviewed all nursing notes, pharmacy notes, vitals, pertinent old records I have discussed plan of care as described above with RN and patient/family.  CBC: Recent Labs  Lab 06/05/20 0600 06/05/20 0836 06/06/20 0318 06/07/20 0610 06/08/20 0549 06/09/20 0217  WBC 6.8  --  8.5 7.8 7.7 5.7  NEUTROABS 4.8  --  6.0 5.6  --   --   HGB 5.7* 5.8* 7.0* 8.5* 8.0* 8.0*  HCT 17.3* 17.8* 20.5* 23.8* 23.8* 23.2*  MCV 87.8  --  86.9 84.4 87.5 85.6  PLT 107*  --  125* 141* 148* 158   Basic Metabolic Panel: Recent Labs  Lab 06/05/20 0600 06/08/20 0549 06/09/20 0217  NA 139 138 138  K 3.6 3.2* 3.0*  CL 111 106 102  CO2 24 25 27   GLUCOSE 84 87 103*  BUN 22 9 8   CREATININE 0.95 0.88 0.86  CALCIUM 8.7* 9.3 8.9  MG  --  1.7 1.7  PHOS  --  2.2* 4.2    Studies:  No results found.  Scheduled Meds: . sodium chloride   Intravenous Once  . amLODipine  10 mg Oral Daily  . aspirin EC  81 mg Oral Daily  . atorvastatin  40 mg Oral q1800  . bisacodyl  10 mg Oral QPM  . cyanocobalamin  1,000 mcg Subcutaneous Daily  . docusate sodium  100 mg Oral BID  . folic acid  1 mg Oral Daily  . iohexol  500 mL Oral Q1H  . Ipratropium-Albuterol  1 puff Inhalation Q6H  . magnesium oxide  400 mg Oral BID  . mineral oil  1 enema Rectal Once  . multivitamin with minerals  1 tablet Oral Daily  . pantoprazole  40 mg Oral BID  . polyethylene glycol  17 g Oral Daily  . potassium chloride  40 mEq Oral Q6H  . simethicone  80 mg Oral QID  . Vitamin D (Ergocalciferol)  50,000 Units Oral Q7 days   Continuous Infusions: PRN Meds: acetaminophen **OR** acetaminophen, bisacodyl,  hydrALAZINE, ondansetron **OR** ondansetron (ZOFRAN) IV  Time spent: 35 minutes  Author: Gillis Santa. MD [facility] 06/09/2020 2:21 PM  To reach On-call, see care teams to locate the attending and reach out to them via [email or web address]. If 7PM-7AM, please contact night-coverage If you still have difficulty reaching the attending provider, please page the [facility] (Director on Call) for [facility] on amion for assistance.

## 2020-06-09 NOTE — Progress Notes (Signed)
OT Cancellation Note  Patient Details Name: Stephanie [name] MRN: [id number] DOB: August 23, 1955   Cancelled Treatment:    Reason Eval/Treat Not Completed: Patient declined, no reason specified. Pt refused OT intervention this session. OT will follow up at next available time.    Jackquline Denmark, MS, OTR/L , CBIS ascom [phone]  06/09/20, 3:42 PM   06/09/2020, 3:42 PM

## 2020-06-09 NOTE — NC FL2 (Signed)
[place] MEDICAID FL2 LEVEL OF CARE SCREENING TOOL     IDENTIFICATION  Patient Name: Stephanie [name] Birthdate: Jul 18, 1955 Sex: female Admission Date (Current Location): 06/01/2020  [place] and [place] Number:  Chiropodist and Address:  [facility], [address], [place], [place] [address]      Provider Number: [id number]  Attending Physician Name and Address:  Gillis Santa, MD  Relative Name and Phone Number:  [name] [phone]    Current Level of Care: Hospital Recommended Level of Care: Skilled Nursing Facility Prior Approval Number:    Date Approved/Denied:   PASRR Number: [id number]  Discharge Plan: SNF    Current Diagnoses: Patient Active Problem List   Diagnosis Date Noted  . Severe anemia 06/05/2020  . Acute kidney injury (HCC) 06/02/2020  . Intractable vomiting with nausea 06/01/2020  . AKI (acute kidney injury) (HCC) 06/01/2020  . Gastroenteritis due to COVID-19 virus 06/01/2020  . Weakness   . Primary hypertension   . Acute respiratory failure with hypoxia (HCC) 05/19/2020  . COVID-19 05/18/2020  . Dehydration 05/18/2020  . Slurred speech 05/18/2020  . Acute ischemic left ACA stroke (HCC) 05/18/2020    Orientation RESPIRATION BLADDER Height & Weight     Self,Time,Situation,Place  Normal Continent Weight: 74.6 kg (bed scale) Height:  6' (182.9 cm)  BEHAVIORAL SYMPTOMS/MOOD NEUROLOGICAL BOWEL NUTRITION STATUS      Continent Diet (Dysphagia 1)  AMBULATORY STATUS COMMUNICATION OF NEEDS Skin   Limited Assist Verbally Normal                       Personal Care Assistance Level of Assistance  Bathing,Feeding,Dressing Bathing Assistance: Limited assistance Feeding assistance: Limited assistance Dressing Assistance: Limited assistance     Functional Limitations Info  Sight,Hearing,Speech Sight Info: Adequate Hearing Info: Adequate Speech Info: Adequate    SPECIAL CARE FACTORS FREQUENCY   PT (By licensed PT),OT (By licensed OT)                    Contractures Contractures Info: Not present    Additional Factors Info  Code Status Code Status Info: Full Allergies Info: No known drug allergies           Current Medications (06/09/2020):  This is the current hospital active medication list Current Facility-Administered Medications  Medication Dose Route Frequency Provider Last Rate Last Admin  . 0.9 %  sodium chloride infusion (Manually program via Guardrails IV Fluids)   Intravenous Once Lurene Shadow, MD      . acetaminophen (TYLENOL) tablet 650 mg  650 mg Oral Q6H PRN Cox, Amy N, DO       Or  . acetaminophen (TYLENOL) suppository 325 mg  325 mg Rectal Q6H PRN Cox, Amy N, DO      . amLODipine (NORVASC) tablet 10 mg  10 mg Oral Daily Cox, Amy N, DO   10 mg at 06/09/20 9702  . aspirin EC tablet 81 mg  81 mg Oral Daily Lurene Shadow, MD   81 mg at 06/09/20 6378  . atorvastatin (LIPITOR) tablet 40 mg  40 mg Oral q1800 Cox, Amy N, DO   40 mg at 06/08/20 1757  . bisacodyl (DULCOLAX) EC tablet 10 mg  10 mg Oral QPM Gillis Santa, MD   10 mg at 06/08/20 1757  . bisacodyl (DULCOLAX) suppository 10 mg  10 mg Rectal Daily PRN Gillis Santa, MD      . cyanocobalamin ((VITAMIN B-12)) injection 1,000  mcg  1,000 mcg Subcutaneous Daily Lurene Shadow, MD   1,000 mcg at 06/09/20 4431  . docusate sodium (COLACE) capsule 100 mg  100 mg Oral BID Manuela Schwartz, NP   100 mg at 06/09/20 5400  . folic acid (FOLVITE) tablet 1 mg  1 mg Oral Daily Lurene Shadow, MD   1 mg at 06/09/20 8676  . hydrALAZINE (APRESOLINE) tablet 25 mg  25 mg Oral Q8H PRN Cox, Amy N, DO      . Ipratropium-Albuterol (COMBIVENT) respimat 1 puff  1 puff Inhalation Q6H Cox, Amy N, DO   1 puff at 06/09/20 0929  . magnesium oxide (MAG-OX) tablet 400 mg  400 mg Oral BID Gillis Santa, MD   400 mg at 06/09/20 1950  . mineral oil enema 1 enema  1 enema Rectal Once Gillis Santa, MD      . multivitamin with minerals  tablet 1 tablet  1 tablet Oral Daily Lurene Shadow, MD   1 tablet at 06/09/20 9326  . ondansetron (ZOFRAN) tablet 4 mg  4 mg Oral Q6H PRN Cox, Amy N, DO       Or  . ondansetron (ZOFRAN) injection 4 mg  4 mg Intravenous Q6H PRN Cox, Amy N, DO   4 mg at 06/02/20 0538  . pantoprazole (PROTONIX) EC tablet 40 mg  40 mg Oral BID Lurene Shadow, MD   40 mg at 06/09/20 7124  . polyethylene glycol (MIRALAX / GLYCOLAX) packet 17 g  17 g Oral Daily Gillis Santa, MD   17 g at 06/09/20 1025  . potassium chloride SA (KLOR-CON) CR tablet 40 mEq  40 mEq Oral Q6H Gillis Santa, MD   40 mEq at 06/09/20 5809  . simethicone (MYLICON) chewable tablet 80 mg  80 mg Oral QID Gillis Santa, MD   80 mg at 06/09/20 1025  . Vitamin D (Ergocalciferol) (DRISDOL) capsule 50,000 Units  50,000 Units Oral Q7 days Gillis Santa, MD   50,000 Units at 06/08/20 1053     Discharge Medications: Please see discharge summary for a list of discharge medications.  Relevant Imaging Results:  Relevant Lab Results:   Additional Information SS#: [id number]  Trenton Founds, RN

## 2020-06-09 NOTE — Telephone Encounter (Signed)
Toney Reil, MD  Elicia Lamp I Please schedule a new patient appointment in 4 weeks  Dx: Anemia   Thanks  Arlyss Repress, MD  [address]  [address]  [place], [place] [address]  Main: [phone]  Fax: [phone]  Pager: [phone]   No current phone number for patient, sent letter to call us to schedule.

## 2020-06-10 ENCOUNTER — Other Ambulatory Visit: Payer: Self-pay

## 2020-06-10 ENCOUNTER — Encounter
Admission: EM | Disposition: A | Discharge status: Home / Self Care | Payer: Self-pay | Source: Home / Self Care | Attending: Internal Medicine

## 2020-06-10 ENCOUNTER — Inpatient Hospital Stay: Payer: HRSA Program | Admitting: Anesthesiology

## 2020-06-10 ENCOUNTER — Encounter: Payer: Self-pay | Admitting: Internal Medicine

## 2020-06-10 DIAGNOSIS — K626 Ulcer of anus and rectum: Secondary | ICD-10-CM

## 2020-06-10 DIAGNOSIS — E876 Hypokalemia: Secondary | ICD-10-CM

## 2020-06-10 DIAGNOSIS — K635 Polyp of colon: Secondary | ICD-10-CM

## 2020-06-10 HISTORY — PX: COLONOSCOPY WITH PROPOFOL: SHX5780

## 2020-06-10 LAB — CBC
HCT: 24.2 % — ABNORMAL LOW (ref 36.0–46.0)
Hemoglobin: 8.3 g/dL — ABNORMAL LOW (ref 12.0–15.0)
MCH: 30.1 pg (ref 26.0–34.0)
MCHC: 34.3 g/dL (ref 30.0–36.0)
MCV: 87.7 fL (ref 80.0–100.0)
Platelets: 181 10*3/uL (ref 150–400)
RBC: 2.76 MIL/uL — ABNORMAL LOW (ref 3.87–5.11)
RDW: 14.9 % (ref 11.5–15.5)
WBC: 4.9 10*3/uL (ref 4.0–10.5)
nRBC: 0 % (ref 0.0–0.2)

## 2020-06-10 LAB — PHOSPHORUS: Phosphorus: 1.9 mg/dL — ABNORMAL LOW (ref 2.5–4.6)

## 2020-06-10 LAB — BASIC METABOLIC PANEL
Anion gap: 7 (ref 5–15)
BUN: 5 mg/dL — ABNORMAL LOW (ref 8–23)
CO2: 27 mmol/L (ref 22–32)
Calcium: 9.3 mg/dL (ref 8.9–10.3)
Chloride: 107 mmol/L (ref 98–111)
Creatinine, Ser: 0.73 mg/dL (ref 0.44–1.00)
GFR, Estimated: >60 mL/min (ref >60)
Glucose, Bld: 88 mg/dL (ref 70–99)
Potassium: 3.6 mmol/L (ref 3.5–5.1)
Sodium: 141 mmol/L (ref 135–145)

## 2020-06-10 LAB — GLUCOSE, CAPILLARY: Glucose-Capillary: 83 mg/dL (ref 70–99)

## 2020-06-10 LAB — MAGNESIUM: Magnesium: 2.1 mg/dL (ref 1.7–2.4)

## 2020-06-10 SURGERY — COLONOSCOPY WITH PROPOFOL
Anesthesia: General

## 2020-06-10 MED ORDER — LIDOCAINE HCL (CARDIAC) PF 100 MG/5ML IV SOSY
PREFILLED_SYRINGE | INTRAVENOUS | Status: DC | PRN
  Administered 2020-06-10: 50 mg via INTRAVENOUS

## 2020-06-10 MED ORDER — PROPOFOL 500 MG/50ML IV EMUL
INTRAVENOUS | Status: DC | PRN
  Administered 2020-06-10: 150 ug/kg/min via INTRAVENOUS

## 2020-06-10 MED ORDER — POTASSIUM CHLORIDE CRYS ER 20 MEQ PO TBCR
40.0000 meq | EXTENDED_RELEASE_TABLET | Freq: Once | ORAL | Status: AC
  Administered 2020-06-10: 40 meq via ORAL
  Filled 2020-06-10: qty 2

## 2020-06-10 MED ORDER — POTASSIUM & SODIUM PHOSPHATES 280-160-250 MG PO PACK
1.0000 | PACK | Freq: Three times a day (TID) | ORAL | Status: AC
  Filled 2020-06-10 (×4): qty 1

## 2020-06-10 MED ORDER — PROPOFOL 10 MG/ML IV BOLUS
INTRAVENOUS | Status: AC
  Filled 2020-06-10: qty 20

## 2020-06-10 MED ORDER — PROPOFOL 10 MG/ML IV BOLUS
INTRAVENOUS | Status: DC | PRN
  Administered 2020-06-10: 20 mg via INTRAVENOUS
  Administered 2020-06-10: 50 mg via INTRAVENOUS

## 2020-06-10 MED ORDER — POTASSIUM CHLORIDE 10 MEQ/100ML IV SOLN
10.0000 meq | INTRAVENOUS | Status: DC
  Administered 2020-06-10 (×2): 10 meq via INTRAVENOUS
  Filled 2020-06-10 (×2): qty 100

## 2020-06-10 NOTE — Progress Notes (Signed)
PROGRESS NOTE    Stephanie [name]  IWP:[id number] DOB: 21-Jan-1956 DOA: 06/01/2020 PCP: Pcp, No   Assessment & Plan:   Principal Problem:   Intractable vomiting with nausea Active Problems:   COVID-19   Dehydration   Primary hypertension   AKI (acute kidney injury) (HCC)   Gastroenteritis due to COVID-19 virus   Acute kidney injury (HCC)   Severe anemia     Normocytic anemia: s/p 2 units of pRBCs transfused. Continue to hold plavix. Continue on PPI as per GI. No emergent EGD & can f/u outpatient as per GI. Hx of recurrent mandibular dislocation & high risk for anesthesia as per ENT. Colonoscopy today as per GI  Unintentional weight loss: CT chest/abd/pelvis shows need for repeat CT in 3-6 months to assess degree of underlying scarring or fibrosis if desired, emphysema & moderate burden of stool   Recurrent mandibular dislocation: s/p closed reduction mandibular dislocation on 06/03/2020.  HTN: continue amlodipine   Vitamin B12 deficiency: resolved w/ vitamin B12 level 6,678  Folate deficiency: resolved w/ folate at 6.7  GERD: continue on PPI   Recent COVID-19 infection: dx 05/17/20. Completed isolation course  Hypokalemia: KCl repleted. Will continue to monitor   Hypophosphatemia: phosp repleted. Will continue to monitor   Constipation: continue on miralax, dulcolax, & enema.   Vitamin D deficiency: continue on vitamin D supplements   DVT prophylaxis: SCDs Code Status: full  Family Communication:  Disposition Plan: unclear. Pt does not have any insurance.    Status is: Inpatient  Remains inpatient appropriate because:Ongoing diagnostic testing needed not appropriate for outpatient work up, Unsafe d/c plan and IV treatments appropriate due to intensity of illness or inability to take PO, will have colonoscopy today    Dispo: The patient is from: Home              Anticipated d/c is to: SNF              Anticipated d/c date is: unclear               Patient currently is not medically stable to d/c.   Difficult to place patient Yes     Consultants:   GI   Procedures:    Antimicrobials:    Subjective: Pt c/o being hungry   Objective: Vitals:   06/10/20 0415 06/10/20 0810 06/10/20 1112 06/10/20 1406  BP: (!) 142/85 (!) 148/89 (!) 142/82 125/71  Pulse: 89 86 92 87  Resp: 18 17 18 18   Temp: 98.2 F (36.8 C) 98.4 F (36.9 C) 98.2 F (36.8 C) 98 F (36.7 C)  TempSrc:   Oral Temporal  SpO2: 100% 98% 100% 98%  Weight:    74 kg  Height:    6' (1.829 m)   No intake or output data in the 24 hours ending 06/10/20 1418 Filed Weights   06/03/20 1126 06/04/20 1436 06/10/20 1406  Weight: 67.1 kg 74.6 kg 74 kg    Examination:  General exam: Appears calm and comfortable  Respiratory system: Clear to auscultation. Respiratory effort normal. Cardiovascular system: S1 & S2 +. No rubs, gallops or clicks.  Gastrointestinal system: Abdomen is nondistended, soft and nontender. Hyperactive bowel sounds heard. Central nervous system: Alert and oriented. Moves all extremities  Psychiatry: Judgement and insight appear normal. Flat mood and affect     Data Reviewed: I have personally reviewed following labs and imaging studies  CBC: Recent Labs  Lab 06/05/20 0600 06/05/20 0836 06/06/20 0318 06/07/20 0610 06/08/20 0549  06/09/20 0217 06/10/20 0900  WBC 6.8  --  8.5 7.8 7.7 5.7 4.9  NEUTROABS 4.8  --  6.0 5.6  --   --   --   HGB 5.7*   < > 7.0* 8.5* 8.0* 8.0* 8.3*  HCT 17.3*   < > 20.5* 23.8* 23.8* 23.2* 24.2*  MCV 87.8  --  86.9 84.4 87.5 85.6 87.7  PLT 107*  --  125* 141* 148* 158 181   < > = values in this interval not displayed.   Basic Metabolic Panel: Recent Labs  Lab 06/05/20 0600 06/08/20 0549 06/09/20 0217 06/10/20 0900  NA 139 138 138 141  K 3.6 3.2* 3.0* 3.6  CL 111 106 102 107  CO2 24 25 27 27   GLUCOSE 84 87 103* 88  BUN 22 9 8  5*  CREATININE 0.95 0.88 0.86 0.73  CALCIUM 8.7* 9.3 8.9 9.3  MG  --   1.7 1.7 2.1  PHOS  --  2.2* 4.2 1.9*   GFR: Estimated Creatinine Clearance: 83.1 mL/min (by C-G formula based on SCr of 0.73 mg/dL). Liver Function Tests: No results for input(s): AST, ALT, ALKPHOS, BILITOT, PROT, ALBUMIN in the last 168 hours. No results for input(s): LIPASE, AMYLASE in the last 168 hours. No results for input(s): AMMONIA in the last 168 hours. Coagulation Profile: No results for input(s): INR, PROTIME in the last 168 hours. Cardiac Enzymes: No results for input(s): CKTOTAL, CKMB, CKMBINDEX, TROPONINI in the last 168 hours. BNP (last 3 results) No results for input(s): PROBNP in the last 8760 hours. HbA1C: No results for input(s): HGBA1C in the last 72 hours. CBG: Recent Labs  Lab 06/03/20 2256  GLUCAP 110*   Lipid Profile: No results for input(s): CHOL, HDL, LDLCALC, TRIG, CHOLHDL, LDLDIRECT in the last 72 hours. Thyroid Function Tests: No results for input(s): TSH, T4TOTAL, FREET4, T3FREE, THYROIDAB in the last 72 hours. Anemia Panel: Recent Labs    06/09/20 0217  VITAMINB12 6,678*  FOLATE 6.7  FERRITIN 211   Sepsis Labs: No results for input(s): PROCALCITON, LATICACIDVEN in the last 168 hours.  Recent Results (from the past 240 hour(s))  Blood culture (single)     Status: None   Collection Time: 06/02/20  1:44 AM   Specimen: BLOOD  Result Value Ref Range Status   Specimen Description BLOOD RIGHT [facility]  Final   Special Requests   Final    BOTTLES DRAWN AEROBIC AND ANAEROBIC Blood Culture adequate volume   Culture   Final    NO GROWTH 5 DAYS Performed at [facility], [address] [place], [address] [place]    Report Status 06/07/2020 FINAL  Final         Radiology Studies: CT CHEST ABDOMEN PELVIS W CONTRAST  Result Date: 06/09/2020 CLINICAL DATA:  Unintended weight loss, recent COVID infection, recent stroke, constipation EXAM: CT CHEST, ABDOMEN, AND PELVIS WITH CONTRAST TECHNIQUE: Multidetector CT imaging of the chest, abdomen  and pelvis was performed following the standard protocol during bolus administration of intravenous contrast. CONTRAST:  06/09/2020 OMNIPAQUE IOHEXOL 300 MG/ML SOLN, additional oral enteric contrast COMPARISON:  CT chest angiogram 05/19/2020 FINDINGS: CT CHEST FINDINGS Cardiovascular: Scattered aortic atherosclerosis. Normal heart size. No pericardial effusion. Mediastinum/Nodes: No enlarged mediastinal, hilar, or axillary lymph nodes. Thyroid gland, trachea, and esophagus demonstrate no significant findings. Lungs/Pleura: Moderate centrilobular emphysema. Diffuse bilateral bronchial wall thickening. Interval improvement in superimposed ground-glass airspace disease, with some residual airspace disease and or scarring scattered throughout the lungs, particularly in  the posterior upper lobes (series 4, image 51). No pleural effusion or pneumothorax. Musculoskeletal: No chest wall mass or suspicious bone lesions identified. CT ABDOMEN PELVIS FINDINGS Hepatobiliary: No solid liver abnormality is seen. No gallstones, gallbladder wall thickening, or biliary dilatation. Pancreas: Unremarkable. No pancreatic ductal dilatation or surrounding inflammatory changes. Spleen: Normal in size without significant abnormality. Adrenals/Urinary Tract: Adrenal glands are unremarkable. Kidneys are normal, without renal calculi, solid lesion, or hydronephrosis. Bladder is unremarkable. Stomach/Bowel: Stomach is within normal limits. Appendix appears normal. Moderate burden of stool throughout the colon with stool balls in the rectum. There is mild rectal wall thickening and perirectal/presacral fat stranding (series 2, image 115). Vascular/Lymphatic: No significant vascular findings are present. No enlarged abdominal or pelvic lymph nodes. Reproductive: Status post hysterectomy. Other: No abdominal wall hernia or abnormality. No abdominopelvic ascites. Musculoskeletal: No acute or significant osseous findings. IMPRESSION: 1. Interval  improvement in superimposed ground-glass airspace disease, with some residual airspace disease and or scarring scattered throughout the lungs, particularly in the posterior upper lobes. Findings are consistent with resolving airspace disease. Additional CT follow-up may be performed at 3-6 months to assess for degree of chronic underlying scarring or fibrosis if desired. 2. Emphysema. 3. Moderate burden of stool throughout the colon with stool balls in the rectum. There is mild rectal wall thickening and perirectal/presacral fat stranding, suggestive of stercoral colitis, although this appearance could also be seen with prior radiation therapy to the pelvis. Correlate with clinical history. 4. Emphysema. Aortic Atherosclerosis (ICD10-I70.0) and Emphysema (ICD10-J43.9). Electronically Signed   By: Lauralyn Primes M.D.   On: 06/09/2020 16:07        Scheduled Meds: . sodium chloride   Intravenous Once  . amLODipine  10 mg Oral Daily  . aspirin EC  81 mg Oral Daily  . atorvastatin  40 mg Oral q1800  . bisacodyl  10 mg Oral QPM  . docusate sodium  100 mg Oral BID  . Ipratropium-Albuterol  1 puff Inhalation Q6H  . mineral oil  1 enema Rectal Once  . multivitamin with minerals  1 tablet Oral Daily  . pantoprazole  40 mg Oral BID  . polyethylene glycol  17 g Oral Daily  . polyethylene glycol powder  1 Container Oral Once  . simethicone  80 mg Oral QID  . Vitamin D (Ergocalciferol)  50,000 Units Oral Q7 days   Continuous Infusions: . sodium chloride 10 mL/hr at 06/10/20 1318  . potassium chloride 10 mEq (06/10/20 1238)     LOS: 8 days    Time spent: 31 mins     Charise Killian, MD [facility] Pager [phone]  If 7PM-7AM, please contact night-coverage 06/10/2020, 2:18 PM

## 2020-06-10 NOTE — Anesthesia Preprocedure Evaluation (Addendum)
Anesthesia Evaluation  Patient identified by MRN, date of birth, ID band Patient awake    Reviewed: Allergy & Precautions, H&P , NPO status , reviewed documented beta blocker date and time   Airway Mallampati: III  TM Distance: <3 FB Neck ROM: limited    Dental  (+) Chipped, Missing   Pulmonary former smoker,    Pulmonary exam normal        Cardiovascular hypertension, Normal cardiovascular exam  05/19/2020 ECHO IMPRESSIONS    1. Left ventricular ejection fraction, by estimation, is 55 to 60%. The  left ventricle has normal function. The left ventricle has no regional  wall motion abnormalities. There is mild left ventricular hypertrophy.  Left ventricular diastolic parameters  are consistent with Grade I diastolic dysfunction (impaired relaxation).  2. Right ventricular systolic function is normal. The right ventricular  size is normal. Tricuspid regurgitation signal is inadequate for assessing  PA pressure.  3. Left atrial size was mildly dilated.  4. The mitral valve is normal in structure. No evidence of mitral valve  regurgitation. No evidence of mitral stenosis.  5. The aortic valve is normal in structure. Aortic valve regurgitation is  not visualized. No aortic stenosis is present.    Neuro/Psych CVA    GI/Hepatic N/V on admission   Endo/Other  diabetes  Renal/GU Renal disease     Musculoskeletal   Abdominal   Peds  Hematology  (+) Blood dyscrasia, anemia ,   Anesthesia Other Findings Past Medical History: No date: Diabetes mellitus without complication (HCC)  Past Surgical History: 05/23/2020: CLOSED REDUCTION MANDIBLE; Bilateral     Comment:  Procedure: CLOSED REDUCTION MANDIBULAR;  Surgeon:               Vernie Murders, MD;  Location: [facility] ORS;  Service: ENT;                Laterality: Bilateral; 06/03/2020: CLOSED REDUCTION MANDIBLE; N/A     Comment:  Procedure: CLOSED REDUCTION MANDIBULAR;   Surgeon:               Linus Salmons, MD;  Location: [facility] ORS;  Service: ENT;              Laterality: N/A;  BMI    Body Mass Index: 22.31 kg/m      Reproductive/Obstetrics                           Anesthesia Physical Anesthesia Plan  ASA: III  Anesthesia Plan: General   Post-op Pain Management:    Induction: Intravenous  PONV Risk Score and Plan: Treatment may vary due to age or medical condition and TIVA  Airway Management Planned: Nasal Cannula and Natural Airway  Additional Equipment:   Intra-op Plan:   Post-operative Plan:   Informed Consent: I have reviewed the patients History and Physical, chart, labs and discussed the procedure including the risks, benefits and alternatives for the proposed anesthesia with the patient or authorized representative who has indicated his/her understanding and acceptance.     Dental Advisory Given  Plan Discussed with: CRNA  Anesthesia Plan Comments:         Anesthesia Quick Evaluation

## 2020-06-10 NOTE — Op Note (Signed)
[facility] Gastroenterology Patient Name: Stephanie [name] Procedure Date: 06/10/2020 2:10 PM MRN: [id number] Account #: [id number] Date of Birth: 1955/08/24 Admit Type: Inpatient Age: 65 Room: [facility] ENDO ROOM 1 Gender: Female Note Status: Finalized Procedure:             Colonoscopy Indications:           Iron deficiency anemia Providers:             Uel Davidow B. Maximino Greenland MD, MD Referring MD:          Sallye Lat Md, MD (Referring MD) Medicines:             Monitored Anesthesia Care Complications:         No immediate complications. Procedure:             Pre-Anesthesia Assessment:                        - ASA Grade Assessment: II - A patient with mild                         systemic disease.                        - Prior to the procedure, a History and Physical was                         performed, and patient medications, allergies and                         sensitivities were reviewed. The patient's tolerance                         of previous anesthesia was reviewed.                        - The risks and benefits of the procedure and the                         sedation options and risks were discussed with the                         patient. All questions were answered and informed                         consent was obtained.                        - Patient identification and proposed procedure were                         verified prior to the procedure by the physician, the                         nurse, the anesthesiologist, the anesthetist and the                         technician. The procedure was verified in the                         procedure room.  After obtaining informed consent, the colonoscope was                         passed under direct vision. Throughout the procedure,                         the patient's blood pressure, pulse, and oxygen                         saturations were monitored continuously. The                          Colonoscope was introduced through the anus and                         advanced to the the cecum, identified by appendiceal                         orifice and ileocecal valve. The colonoscopy was                         performed with ease. The patient tolerated the                         procedure well. The quality of the bowel preparation                         was poor. Findings:      The perianal and digital rectal examinations were normal.      Two pedunculated and semi-pedunculated polyps were found in the sigmoid       colon. The polyps were 8 to 10 mm in size. These polyps were removed       with a hot snare. Resection and retrieval were complete.      Multiple ulcers were found in the rectum. No bleeding was present. No       stigmata of recent bleeding were seen. Biopsies were taken with a cold       forceps for histology.      No additional abnormalities were found on retroflexion. Impression:            - Preparation of the colon was poor.                        - Two 8 to 10 mm polyps in the sigmoid colon, removed                         with a hot snare. Resected and retrieved.                        - Multiple ulcers in the rectum. Biopsied. Recommendation:        - Schedule follow up with Dr. Allegra Lai in GI clinic in                         2-4 weeks                        - Advance diet as tolerated.                        -  Continue present medications.                        - Await pathology results.                        - Repeat colonoscopy within 3 months.                        - The findings and recommendations were discussed with                         the patient.                        - The findings and recommendations were discussed with                         the patient's family.                        - Return to primary care physician as previously                         scheduled.                        - Await pathology  results. Procedure Code(s):     --- Professional ---                        [phone], Colonoscopy, flexible; with removal of                         tumor(s), polyp(s), or other lesion(s) by snare                         technique                        [id number], 59, Colonoscopy, flexible; with biopsy, single                         or multiple Diagnosis Code(s):     --- Professional ---                        K63.5, Polyp of colon                        K62.6, Ulcer of anus and rectum                        D50.9, Iron deficiency anemia, unspecified CPT copyright 2019 American Medical Association. All rights reserved. The codes documented in this report are preliminary and upon coder review may  be revised to meet current compliance requirements.  Melodie Bouillon, MD Michel Bickers B. Maximino Greenland MD, MD 06/10/2020 3:02:11 PM This report has been signed electronically. Number of Addenda: 0 Note Initiated On: 06/10/2020 2:10 PM Scope Withdrawal Time: 0 hours 17 minutes 42 seconds  Total Procedure Duration: 0 hours 24 minutes 35 seconds  Estimated Blood Loss:  Estimated blood loss: none.      [facility]

## 2020-06-10 NOTE — Progress Notes (Signed)
Melodie Bouillon, MD [address], [place], [place], [address] [address], [place], [place], [address] Phone: [phone]  Fax: [phone]   Subjective:  Patient denies any episodes of bleeding.  No nausea or vomiting.  Objective: Exam: Vital signs in last 24 hours: Vitals:   06/09/20 1501 06/09/20 2328 06/10/20 0415 06/10/20 0810  BP: 129/75 (!) 142/77 (!) 142/85 (!) 148/89  Pulse: 79 (!) 109 89 86  Resp: 17 18 18 17   Temp: 98.5 F (36.9 C) 98.4 F (36.9 C) 98.2 F (36.8 C) 98.4 F (36.9 C)  TempSrc:      SpO2: 98% 100% 100% 98%  Weight:      Height:       Weight change:  No intake or output data in the 24 hours ending 06/10/20 1041  General: No acute distress, AAO x3 Abd: Soft, NT/ND, No HSM Skin: Warm, no rashes Neck: Supple, Trachea midline   Lab Results: Lab Results  Component Value Date   WBC 4.9 06/10/2020   HGB 8.3 (L) 06/10/2020   HCT 24.2 (L) 06/10/2020   MCV 87.7 06/10/2020   PLT 181 06/10/2020   Micro Results: Recent Results (from the past 240 hour(s))  Blood culture (single)     Status: None   Collection Time: 06/02/20  1:44 AM   Specimen: BLOOD  Result Value Ref Range Status   Specimen Description BLOOD RIGHT AC  Final   Special Requests   Final    BOTTLES DRAWN AEROBIC AND ANAEROBIC Blood Culture adequate volume   Culture   Final    NO GROWTH 5 DAYS Performed at [facility], [address] [place], [place]    Report Status 06/07/2020 FINAL  Final   Studies/Results: CT CHEST ABDOMEN PELVIS W CONTRAST  Result Date: 06/09/2020 CLINICAL DATA:  Unintended weight loss, recent COVID infection, recent stroke, constipation EXAM: CT CHEST, ABDOMEN, AND PELVIS WITH CONTRAST TECHNIQUE: Multidetector CT imaging of the chest, abdomen and pelvis was performed following the standard protocol during bolus administration of intravenous contrast. CONTRAST:  06/11/2020 OMNIPAQUE IOHEXOL 300 MG/ML SOLN,  additional oral enteric contrast COMPARISON:  CT chest angiogram 05/19/2020 FINDINGS: CT CHEST FINDINGS Cardiovascular: Scattered aortic atherosclerosis. Normal heart size. No pericardial effusion. Mediastinum/Nodes: No enlarged mediastinal, hilar, or axillary lymph nodes. Thyroid gland, trachea, and esophagus demonstrate no significant findings. Lungs/Pleura: Moderate centrilobular emphysema. Diffuse bilateral bronchial wall thickening. Interval improvement in superimposed ground-glass airspace disease, with some residual airspace disease and or scarring scattered throughout the lungs, particularly in the posterior upper lobes (series 4, image 51). No pleural effusion or pneumothorax. Musculoskeletal: No chest wall mass or suspicious bone lesions identified. CT ABDOMEN PELVIS FINDINGS Hepatobiliary: No solid liver abnormality is seen. No gallstones, gallbladder wall thickening, or biliary dilatation. Pancreas: Unremarkable. No pancreatic ductal dilatation or surrounding inflammatory changes. Spleen: Normal in size without significant abnormality. Adrenals/Urinary Tract: Adrenal glands are unremarkable. Kidneys are normal, without renal calculi, solid lesion, or hydronephrosis. Bladder is unremarkable. Stomach/Bowel: Stomach is within normal limits. Appendix appears normal. Moderate burden of stool throughout the colon with stool balls in the rectum. There is mild rectal wall thickening and perirectal/presacral fat stranding (series 2, image 115). Vascular/Lymphatic: No significant vascular findings are present. No enlarged abdominal or pelvic lymph nodes. Reproductive: Status post hysterectomy. Other: No abdominal wall hernia or abnormality. No abdominopelvic ascites. Musculoskeletal: No acute or significant osseous findings. IMPRESSION: 1. Interval improvement in superimposed ground-glass airspace disease, with some residual airspace disease and  or scarring scattered throughout the lungs, particularly in the  posterior upper lobes. Findings are consistent with resolving airspace disease. Additional CT follow-up may be performed at 3-6 months to assess for degree of chronic underlying scarring or fibrosis if desired. 2. Emphysema. 3. Moderate burden of stool throughout the colon with stool balls in the rectum. There is mild rectal wall thickening and perirectal/presacral fat stranding, suggestive of stercoral colitis, although this appearance could also be seen with prior radiation therapy to the pelvis. Correlate with clinical history. 4. Emphysema. Aortic Atherosclerosis (ICD10-I70.0) and Emphysema (ICD10-J43.9). Electronically Signed   By: Lauralyn Primes M.D.   On: 06/09/2020 16:07   Medications:  Scheduled Meds: . sodium chloride   Intravenous Once  . amLODipine  10 mg Oral Daily  . aspirin EC  81 mg Oral Daily  . atorvastatin  40 mg Oral q1800  . bisacodyl  10 mg Oral QPM  . cyanocobalamin  1,000 mcg Subcutaneous Daily  . docusate sodium  100 mg Oral BID  . folic acid  1 mg Oral Daily  . Ipratropium-Albuterol  1 puff Inhalation Q6H  . mineral oil  1 enema Rectal Once  . multivitamin with minerals  1 tablet Oral Daily  . pantoprazole  40 mg Oral BID  . polyethylene glycol  17 g Oral Daily  . polyethylene glycol powder  1 Container Oral Once  . simethicone  80 mg Oral QID  . Vitamin D (Ergocalciferol)  50,000 Units Oral Q7 days   Continuous Infusions: . sodium chloride    . potassium chloride     PRN Meds:.acetaminophen **OR** acetaminophen, bisacodyl, hydrALAZINE, ondansetron **OR** ondansetron (ZOFRAN) IV   Assessment: Principal Problem:   Intractable vomiting with nausea Active Problems:   COVID-19   Dehydration   Primary hypertension   AKI (acute kidney injury) (HCC)   Gastroenteritis due to COVID-19 virus   Acute kidney injury (HCC)   Severe anemia    Plan: As detailed in Dr. Verdis Prime note yesterday, patient is at high risk for upper endoscopy due to mandibular dislocation  within the last 2 to 3 weeks  Dr. Allegra Lai discussed the case yesterday with anesthesia, and plan is to undergo colonoscopy only Please see her detailed note  Plavix has been held since 1/20  I have discussed alternative options, risks & benefits,  which include, but are not limited to, bleeding, infection, perforation,respiratory complication & drug reaction.  The patient agrees with this plan & written consent will be obtained.       LOS: 8 days   Melodie Bouillon, MD 06/10/2020, 10:41 AM

## 2020-06-10 NOTE — Progress Notes (Signed)
PT Cancellation Note  Patient Details Name: Stephanie [name] MRN: [id number] DOB: 06-18-1955   Cancelled Treatment:    Reason Eval/Treat Not Completed: Pt standing next to [facility] upon PT entry, then returned to bed. Pt declining to participate with PT services this AM reporting "I'm not doing nothing with you or nobody else until I get this procedure. I'm aggravated as piss because they keep saying 'it'll be this afternoon' but no one can tell me when! I've been up since 9 last night going to the bathroom so much because of this stuff." Pt scheduled for colonoscopy today. Will follow up with therapy intervention tomorrow as appropriate.  Vira Blanco, PT, DPT 12:21 PM,06/10/20

## 2020-06-10 NOTE — Transfer of Care (Signed)
Immediate Anesthesia Transfer of Care Note   Patient: Stephanie [name]  Procedure(s) Performed: COLONOSCOPY WITH PROPOFOL (N/A )  Patient Location: Endoscopy Unit  Anesthesia Type:General  Level of Consciousness: drowsy  Airway & Oxygen Therapy: Patient Spontanous Breathing  Post-op Assessment: Report given to RN and Post -op Vital signs reviewed and stable  Post vital signs: Reviewed and stable  Last Vitals:  Vitals Value Taken Time  BP 109/66 06/10/20 1459  Temp 36.8 C 06/10/20 1500  Pulse 38 06/10/20 1500  Resp 16 06/10/20 1500  SpO2 99 % 06/10/20 1500  Vitals shown include unvalidated device data.  Last Pain:  Vitals:   06/10/20 1500  TempSrc: Temporal  PainSc: 0-No pain         Complications: No complications documented.

## 2020-06-11 ENCOUNTER — Encounter: Payer: Self-pay | Admitting: Gastroenterology

## 2020-06-11 DIAGNOSIS — R531 Weakness: Secondary | ICD-10-CM

## 2020-06-11 LAB — BASIC METABOLIC PANEL
Anion gap: 6 (ref 5–15)
BUN: 5 mg/dL — ABNORMAL LOW (ref 8–23)
CO2: 25 mmol/L (ref 22–32)
Calcium: 9.3 mg/dL (ref 8.9–10.3)
Chloride: 110 mmol/L (ref 98–111)
Creatinine, Ser: 0.81 mg/dL (ref 0.44–1.00)
GFR, Estimated: >60 mL/min (ref >60)
Glucose, Bld: 88 mg/dL (ref 70–99)
Potassium: 4.4 mmol/L (ref 3.5–5.1)
Sodium: 141 mmol/L (ref 135–145)

## 2020-06-11 LAB — CBC
HCT: 25 % — ABNORMAL LOW (ref 36.0–46.0)
Hemoglobin: 8.2 g/dL — ABNORMAL LOW (ref 12.0–15.0)
MCH: 29.2 pg (ref 26.0–34.0)
MCHC: 32.8 g/dL (ref 30.0–36.0)
MCV: 89 fL (ref 80.0–100.0)
Platelets: 201 10*3/uL (ref 150–400)
RBC: 2.81 MIL/uL — ABNORMAL LOW (ref 3.87–5.11)
RDW: 14.9 % (ref 11.5–15.5)
WBC: 5.1 10*3/uL (ref 4.0–10.5)
nRBC: 0 % (ref 0.0–0.2)

## 2020-06-11 LAB — PHOSPHORUS: Phosphorus: 1.9 mg/dL — ABNORMAL LOW (ref 2.5–4.6)

## 2020-06-11 LAB — MAGNESIUM: Magnesium: 2 mg/dL (ref 1.7–2.4)

## 2020-06-11 NOTE — Progress Notes (Signed)
Physical Therapy Treatment Patient Details Name: Stephanie [name] MRN: [id number] DOB: October 29, 1955 Today's Date: 06/11/2020    History of Present Illness Patient is a 65 year old female who presents for nausea and vomiting. 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 Discharged from hospital on 05/29/20. Per documentation patient had a fall cleaning a toilet at work with anterior dislocation of mandiublar conydles which required closed reduction 05/23/20. Received transfusion while hospitalized.    PT Comments    Pt seen this am briefly with a return for increased functional mobility this pm. Pt very agreeable to OOB activity consisting of supine to sit with supervison, sitting EOB x 5 minutes unsupported without LOB. Pt completed dynamic reaching in standing with single UE support x 8 minutes with one seated rest break due to fatigue. Gait training in room with RW and CGA for safety 62ft x1, 12ft x 1 with vc's for increased safety awareness.  Pt positioned in recliner with call bell in reach, chair alarm on.  Pt remains appropriate for short term SNF placement to return to baseline function prior to returning home.    Follow Up Recommendations  SNF     Equipment Recommendations  Rolling walker with 5" wheels    Recommendations for Other Services       Precautions / Restrictions Precautions Precautions: Fall Restrictions Weight Bearing Restrictions: No    Mobility  Bed Mobility Overal bed mobility: Needs Assistance Bed Mobility: Rolling Rolling: Supervision   Supine to sit: Supervision;HOB elevated        Transfers Overall transfer level: Needs assistance Equipment used: Rolling walker (2 wheeled) Transfers: Sit to/from [employer] Transfers Sit to Stand: Min guard;Min assist Stand pivot transfers: Min assist;Min guard       General transfer comment: Min/CGA to raise from  toilet  Ambulation/Gait Ambulation/Gait assistance: Min guard Gait Distance (Feet): 45 Feet Assistive device: Rolling walker (2 wheeled) Gait Pattern/deviations: Decreased step length - right;Decreased step length - left Gait velocity: decreased       Stairs             Wheelchair Mobility    Modified Rankin (Stroke Patients Only)       Balance Overall balance assessment: Needs assistance Sitting-balance support: Feet supported;No upper extremity supported Sitting balance-Leahy Scale: Good     Standing balance support: Single extremity supported;Bilateral upper extremity supported;During functional activity Standing balance-Leahy Scale: Fair Standing balance comment: Requires BUE support with ambulation. Able to static stand with SUE support while performing toileting however requires close CGA and set up.                            Cognition Arousal/Alertness: Awake/alert Behavior During Therapy: WFL for tasks assessed/performed Overall Cognitive Status: Within Functional Limits for tasks assessed                         Following Commands: Follows one step commands consistently Safety/Judgement: Decreased awareness of safety   Problem Solving:  (very chatty) General Comments: min cuing      Exercises      General Comments General comments (skin integrity, edema, etc.): Pt stood in front of sink with single UE support and CGA while completing hygiene and dynamic reaching activities.      Pertinent Vitals/Pain Pain Assessment: No/denies pain    Home Living  Prior Function            PT Goals (current goals can now be found in the care plan section) Acute Rehab PT Goals Patient Stated Goal: to get stronger and get out of the hospital    Frequency    Min 2X/week      PT Plan Current plan remains appropriate    Co-evaluation              AM-PAC PT "6 Clicks" Mobility   Outcome  Measure  Help needed turning from your back to your side while in a flat bed without using bedrails?: A Little Help needed moving from lying on your back to sitting on the side of a flat bed without using bedrails?: A Little Help needed moving to and from a bed to a chair (including a wheelchair)?: A Little Help needed standing up from a chair using your arms (e.g., wheelchair or bedside chair)?: A Little Help needed to walk in hospital room?: A Little Help needed climbing 3-5 steps with a railing? : A Lot 6 Click Score: 17    End of Session Equipment Utilized During Treatment: Gait belt Activity Tolerance: Patient tolerated treatment well Patient left: in chair;with chair alarm set;with call bell/phone within reach Nurse Communication: Mobility status PT Visit Diagnosis: Unsteadiness on feet (R26.81);Other abnormalities of gait and mobility (R26.89);Muscle weakness (generalized) (M62.81);History of falling (Z91.81);Difficulty in walking, not elsewhere classified (R26.2)     Time: 1400-1450 PT Time Calculation (min) (ACUTE ONLY): 50 min  Charges:  $Gait Training: 8-22 mins $Therapeutic Exercise: 8-22 mins $Therapeutic Activity: 8-22 mins                     Zadie Cleverly, PTA   Jannet Askew 06/11/2020, 4:02 PM

## 2020-06-11 NOTE — Plan of Care (Signed)

## 2020-06-11 NOTE — Progress Notes (Signed)
   Melodie Bouillon, MD [address], [place], [place], [address] [address], [place], [place], [address] Phone: [phone]  Fax: [phone]   Subjective: Patient denies any further nausea and vomiting.  Tolerating oral diet without difficulty.   Objective: Exam: Vital signs in last 24 hours: Vitals:   06/10/20 2322 06/11/20 0451 06/11/20 0726 06/11/20 1207  BP: 136/77 136/88 130/86 136/82  Pulse: 93 92 72 73  Resp: 15 16 16 16   Temp: 98.2 F (36.8 C) 98.5 F (36.9 C) 98.3 F (36.8 C) 98.5 F (36.9 C)  TempSrc:      SpO2: 97% 100% 98% 97%  Weight:      Height:       Weight change:   Intake/Output Summary (Last 24 hours) at 06/11/2020 1552 Last data filed at 06/11/2020 1413 Gross per 24 hour  Intake 0 ml  Output --  Net 0 ml    General: No acute distress, AAO x3 Abd: Soft, NT/ND, No HSM Skin: Warm, no rashes Neck: Supple, Trachea midline   Lab Results: Lab Results  Component Value Date   WBC 5.1 06/11/2020   HGB 8.2 (L) 06/11/2020   HCT 25.0 (L) 06/11/2020   MCV 89.0 06/11/2020   PLT 201 06/11/2020   Micro Results: Recent Results (from the past 240 hour(s))  Blood culture (single)     Status: None   Collection Time: 06/02/20  1:44 AM   Specimen: BLOOD  Result Value Ref Range Status   Specimen Description BLOOD RIGHT [facility]  Final   Special Requests   Final    BOTTLES DRAWN AEROBIC AND ANAEROBIC Blood Culture adequate volume   Culture   Final    NO GROWTH 5 DAYS Performed at [facility], [address], [place], [place]    Report Status 06/07/2020 FINAL  Final   Studies/Results: No results found. Medications:  Scheduled Meds: . sodium chloride   Intravenous Once  . amLODipine  10 mg Oral Daily  . aspirin EC  81 mg Oral Daily  . atorvastatin  40 mg Oral q1800  . bisacodyl  10 mg Oral QPM  . docusate sodium  100 mg Oral BID  . Ipratropium-Albuterol  1 puff Inhalation Q6H  . mineral oil  1 enema  Rectal Once  . multivitamin with minerals  1 tablet Oral Daily  . pantoprazole  40 mg Oral BID  . polyethylene glycol  17 g Oral Daily  . polyethylene glycol powder  1 Container Oral Once  . simethicone  80 mg Oral QID  . Vitamin D (Ergocalciferol)  50,000 Units Oral Q7 days   Continuous Infusions: PRN Meds:.acetaminophen **OR** acetaminophen, bisacodyl, hydrALAZINE, ondansetron **OR** ondansetron (ZOFRAN) IV   Assessment: Principal Problem:   Intractable vomiting with nausea Active Problems:   COVID-19   Dehydration   Primary hypertension   AKI (acute kidney injury) (HCC)   Gastroenteritis due to COVID-19 virus   Acute kidney injury (HCC)   Severe anemia    Plan: Patient is nausea vomiting have resolved Due to high risk for upper endoscopy at this time given mandibular dislocation, upper endoscopy not recommended due to high risks and benefits at this time  Follow-up in GI clinic post discharge to discuss timeline or need for upper endoscopy in the near future  Patient agreeable with above plan   LOS: 9 days   06/09/2020, MD 06/11/2020, 3:52 PM

## 2020-06-11 NOTE — Progress Notes (Signed)
PROGRESS NOTE    Stephanie [name]  RKY:[id number] DOB: 02/26/1956 DOA: 06/01/2020 PCP: Pcp, No   Assessment & Plan:   Principal Problem:   Intractable vomiting with nausea Active Problems:   COVID-19   Dehydration   Primary hypertension   AKI (acute kidney injury) (HCC)   Gastroenteritis due to COVID-19 virus   Acute kidney injury (HCC)   Severe anemia     Normocytic anemia: s/p 2 units of pRBCs transfused. Continue to hold plavix. Continue on PPI as per GI. No emergent EGD & can f/u outpatient as per GI. Hx of recurrent mandibular dislocation & high risk for anesthesia as per ENT. S/p colonoscopy which showed 2 polyps in the sigmoid colon that were removed & sent to pathology as well as multiple ulcers found in the rectum which were biopsied.   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  HTN: continue on amlodipine   Vitamin B12 deficiency: B12 level 6,678, resolved.  Folate deficiency: folate 6.7, resolved.   GERD: continue on PPI   Recent COVID-19 infection: dx 05/17/20. Completed isolation course  Hypokalemia: WNL today.  Hypophosphatemia: encourage po intake. Will continue to monitor   Constipation: resolved   Vitamin D deficiency: continue on vitamin D supplements.   DVT prophylaxis: SCDs Code Status: full  Family Communication:  Disposition Plan: unclear. Pt does not have any insurance.    Status is: Inpatient  Remains inpatient appropriate because:Ongoing diagnostic testing needed not appropriate for outpatient work up, Unsafe d/c plan and IV treatments appropriate due to intensity of illness or inability to take PO, will have colonoscopy  today    Dispo: The patient is from: Home              Anticipated d/c is to: SNF vs [facility]               Anticipated d/c date is: unclear              Patient currently is not medically stable to d/c.   Difficult to place patient Yes     Consultants:   GI   Procedures:    Antimicrobials:    Subjective: Pt c/o weakness   Objective: Vitals:   06/10/20 2052 06/10/20 2322 06/11/20 0451 06/11/20 0726  BP: 137/75 136/77 136/88 130/86  Pulse: 69 93 92 72  Resp: 17 15 16 16   Temp: 98.8 F (37.1 C) 98.2 F (36.8 C) 98.5 F (36.9 C) 98.3 F (36.8 C)  TempSrc: Oral     SpO2: 95% 97% 100% 98%  Weight:      Height:        Intake/Output Summary (Last 24 hours) at 06/11/2020 0751 Last data filed at 06/10/2020 1455 Gross per 24 hour  Intake 300 ml  Output -  Net 300 ml   Filed Weights   06/03/20 1126 06/04/20 1436 06/10/20 1406  Weight: 67.1 kg 74.6 kg 74 kg    Examination:  General exam: Appears comfortable & calm  Respiratory system: clear breath sounds b/l  Cardiovascular system: S1/S2+. No rubs or gallops Gastrointestinal system: Abd is soft, NT,ND & normal bowel sounds  Central nervous system: Alert and oriented. Moves all 4 extremities  Psychiatry: Judgement and insight  appear normal. Flat mood and affect     Data Reviewed: I have personally reviewed following labs and imaging studies  CBC: Recent Labs  Lab 06/05/20 0600 06/05/20 0836 06/06/20 0318 06/07/20 0610 06/08/20 0549 06/09/20 0217 06/10/20 0900 06/11/20 0548  WBC 6.8  --  8.5 7.8 7.7 5.7 4.9 5.1  NEUTROABS 4.8  --  6.0 5.6  --   --   --   --   HGB 5.7*   < > 7.0* 8.5* 8.0* 8.0* 8.3* 8.2*  HCT 17.3*   < > 20.5* 23.8* 23.8* 23.2* 24.2* 25.0*  MCV 87.8  --  86.9 84.4 87.5 85.6 87.7 89.0  PLT 107*  --  125* 141* 148* 158 181 201   < > = values in this interval not displayed.   Basic Metabolic Panel: Recent Labs  Lab 06/05/20 0600 06/08/20 0549 06/09/20 0217 06/10/20 0900 06/11/20 0548   NA 139 138 138 141 141  K 3.6 3.2* 3.0* 3.6 4.4  CL 111 106 102 107 110  CO2 24 25 27 27 25   GLUCOSE 84 87 103* 88 88  BUN 22 9 8  5* 5*  CREATININE 0.95 0.88 0.86 0.73 0.81  CALCIUM 8.7* 9.3 8.9 9.3 9.3  MG  --  1.7 1.7 2.1 2.0  PHOS  --  2.2* 4.2 1.9* 1.9*   GFR: Estimated Creatinine Clearance: 82 mL/min (by C-G formula based on SCr of 0.81 mg/dL). Liver Function Tests: No results for input(s): AST, ALT, ALKPHOS, BILITOT, PROT, ALBUMIN in the last 168 hours. No results for input(s): LIPASE, AMYLASE in the last 168 hours. No results for input(s): AMMONIA in the last 168 hours. Coagulation Profile: No results for input(s): INR, PROTIME in the last 168 hours. Cardiac Enzymes: No results for input(s): CKTOTAL, CKMB, CKMBINDEX, TROPONINI in the last 168 hours. BNP (last 3 results) No results for input(s): PROBNP in the last 8760 hours. HbA1C: No results for input(s): HGBA1C in the last 72 hours. CBG: Recent Labs  Lab 06/10/20 1406  GLUCAP 83   Lipid Profile: No results for input(s): CHOL, HDL, LDLCALC, TRIG, CHOLHDL, LDLDIRECT in the last 72 hours. Thyroid Function Tests: No results for input(s): TSH, T4TOTAL, FREET4, T3FREE, THYROIDAB in the last 72 hours. Anemia Panel: Recent Labs    06/09/20 0217  VITAMINB12 6,678*  FOLATE 6.7  FERRITIN 211   Sepsis Labs: No results for input(s): PROCALCITON, LATICACIDVEN in the last 168 hours.  Recent Results (from the past 240 hour(s))  Blood culture (single)     Status: None   Collection Time: 06/02/20  1:44 AM   Specimen: BLOOD  Result Value Ref Range Status   Specimen Description BLOOD RIGHT [facility]  Final   Special Requests   Final    BOTTLES DRAWN AEROBIC AND ANAEROBIC Blood Culture adequate volume   Culture   Final    NO GROWTH 5 DAYS Performed at [facility], [address] [place], [address] [place]    Report Status 06/07/2020 FINAL  Final         Radiology Studies: CT CHEST ABDOMEN PELVIS W  CONTRAST  Result Date: 06/09/2020 CLINICAL DATA:  Unintended weight loss, recent COVID infection, recent stroke, constipation EXAM: CT CHEST, ABDOMEN, AND PELVIS WITH CONTRAST TECHNIQUE: Multidetector CT imaging of the chest, abdomen and pelvis was performed following the standard protocol during bolus administration of intravenous contrast. CONTRAST:  06/09/2020 OMNIPAQUE IOHEXOL 300 MG/ML SOLN, additional oral enteric contrast COMPARISON:  CT chest angiogram 05/19/2020 FINDINGS: CT CHEST  FINDINGS Cardiovascular: Scattered aortic atherosclerosis. Normal heart size. No pericardial effusion. Mediastinum/Nodes: No enlarged mediastinal, hilar, or axillary lymph nodes. Thyroid gland, trachea, and esophagus demonstrate no significant findings. Lungs/Pleura: Moderate centrilobular emphysema. Diffuse bilateral bronchial wall thickening. Interval improvement in superimposed ground-glass airspace disease, with some residual airspace disease and or scarring scattered throughout the lungs, particularly in the posterior upper lobes (series 4, image 51). No pleural effusion or pneumothorax. Musculoskeletal: No chest wall mass or suspicious bone lesions identified. CT ABDOMEN PELVIS FINDINGS Hepatobiliary: No solid liver abnormality is seen. No gallstones, gallbladder wall thickening, or biliary dilatation. Pancreas: Unremarkable. No pancreatic ductal dilatation or surrounding inflammatory changes. Spleen: Normal in size without significant abnormality. Adrenals/Urinary Tract: Adrenal glands are unremarkable. Kidneys are normal, without renal calculi, solid lesion, or hydronephrosis. Bladder is unremarkable. Stomach/Bowel: Stomach is within normal limits. Appendix appears normal. Moderate burden of stool throughout the colon with stool balls in the rectum. There is mild rectal wall thickening and perirectal/presacral fat stranding (series 2, image 115). Vascular/Lymphatic: No significant vascular findings are present. No enlarged  abdominal or pelvic lymph nodes. Reproductive: Status post hysterectomy. Other: No abdominal wall hernia or abnormality. No abdominopelvic ascites. Musculoskeletal: No acute or significant osseous findings. IMPRESSION: 1. Interval improvement in superimposed ground-glass airspace disease, with some residual airspace disease and or scarring scattered throughout the lungs, particularly in the posterior upper lobes. Findings are consistent with resolving airspace disease. Additional CT follow-up may be performed at 3-6 months to assess for degree of chronic underlying scarring or fibrosis if desired. 2. Emphysema. 3. Moderate burden of stool throughout the colon with stool balls in the rectum. There is mild rectal wall thickening and perirectal/presacral fat stranding, suggestive of stercoral colitis, although this appearance could also be seen with prior radiation therapy to the pelvis. Correlate with clinical history. 4. Emphysema. Aortic Atherosclerosis (ICD10-I70.0) and Emphysema (ICD10-J43.9). Electronically Signed   By: Lauralyn Primes M.D.   On: 06/09/2020 16:07        Scheduled Meds: . sodium chloride   Intravenous Once  . amLODipine  10 mg Oral Daily  . aspirin EC  81 mg Oral Daily  . atorvastatin  40 mg Oral q1800  . bisacodyl  10 mg Oral QPM  . docusate sodium  100 mg Oral BID  . Ipratropium-Albuterol  1 puff Inhalation Q6H  . mineral oil  1 enema Rectal Once  . multivitamin with minerals  1 tablet Oral Daily  . pantoprazole  40 mg Oral BID  . polyethylene glycol  17 g Oral Daily  . polyethylene glycol powder  1 Container Oral Once  . potassium & sodium phosphates  1 packet Oral Q8H  . simethicone  80 mg Oral QID  . Vitamin D (Ergocalciferol)  50,000 Units Oral Q7 days   Continuous Infusions: . sodium chloride 10 mL/hr at 06/10/20 1544     LOS: 9 days    Time spent: 33 mins     Charise Killian, MD [facility] Pager [phone]  If 7PM-7AM, please contact  night-coverage 06/11/2020, 7:51 AM

## 2020-06-11 NOTE — Progress Notes (Signed)
Mobility Specialist - Progress Note   06/11/20 1600  Mobility  Activity Ambulated in room  Range of Motion/Exercises Right leg;Left leg (AP, SLR, ABD, MARCH)  Level of Assistance Standby assist, set-up cues, supervision of patient - no hands on  Assistive Device Front wheel walker  Distance Ambulated (ft) 40 ft  Mobility Response Tolerated well  Mobility performed by Mobility specialist  $Mobility charge 1 Mobility    During mobility: 88 HR, 98% SpO2   Pt was sitting in recliner upon arrival utilizing room air. Pt agreed to session. Pt denied pain and nausea but did c/o fatigue. Pt performed seated exercises: ankle pumps, straight leg raises, abduction, and marching. Pt motivated for mobility as she stated her "legs feel stiff d/t being in bed for days." Pt then proceeded to stand from recliner to RW with minA and extra time. Pt ambulated 40' in room with supervision. No LOB noted. Pt denied SOB. O2 difficult to obtain during ambulation d/t long nails. Overall, pt tolerated session well. Pt encouraged to continue exercises in between session to prevent muscles from tightening. Pt voiced understanding. Pt was left in bed with all needs in reach. Nurse notified.   Filiberto Pinks Mobility Specialist 06/11/20, 4:12 PM

## 2020-06-12 DIAGNOSIS — I10 Essential (primary) hypertension: Secondary | ICD-10-CM

## 2020-06-12 LAB — CBC
HCT: 23.6 % — ABNORMAL LOW (ref 36.0–46.0)
Hemoglobin: 7.6 g/dL — ABNORMAL LOW (ref 12.0–15.0)
MCH: 28.9 pg (ref 26.0–34.0)
MCHC: 32.2 g/dL (ref 30.0–36.0)
MCV: 89.7 fL (ref 80.0–100.0)
Platelets: 215 10*3/uL (ref 150–400)
RBC: 2.63 MIL/uL — ABNORMAL LOW (ref 3.87–5.11)
RDW: 14.9 % (ref 11.5–15.5)
WBC: 4.3 10*3/uL (ref 4.0–10.5)
nRBC: 0 % (ref 0.0–0.2)

## 2020-06-12 LAB — BASIC METABOLIC PANEL
Anion gap: 7 (ref 5–15)
BUN: 6 mg/dL — ABNORMAL LOW (ref 8–23)
CO2: 25 mmol/L (ref 22–32)
Calcium: 9.5 mg/dL (ref 8.9–10.3)
Chloride: 109 mmol/L (ref 98–111)
Creatinine, Ser: 0.97 mg/dL (ref 0.44–1.00)
GFR, Estimated: >60 mL/min (ref >60)
Glucose, Bld: 84 mg/dL (ref 70–99)
Potassium: 4.3 mmol/L (ref 3.5–5.1)
Sodium: 141 mmol/L (ref 135–145)

## 2020-06-12 LAB — PHOSPHORUS: Phosphorus: 2.9 mg/dL (ref 2.5–4.6)

## 2020-06-12 LAB — COPPER, SERUM: Copper: 89 ug/dL (ref 80–158)

## 2020-06-12 LAB — MAGNESIUM: Magnesium: 2 mg/dL (ref 1.7–2.4)

## 2020-06-12 NOTE — Progress Notes (Signed)
Mobility Specialist - Progress Note   06/12/20 1700  Mobility  Activity Ambulated in hall  Level of Assistance Standby assist, set-up cues, supervision of patient - no hands on  Assistive Device Front wheel walker  Distance Ambulated (ft) 160 ft  Mobility Response Tolerated well  Mobility performed by Mobility specialist  $Mobility charge 1 Mobility    Pre-mobility: 101 HR, 98% SpO2 Post-mobility: 94 HR, 92% SpO2   Pt was lying in bed upon arrival utilizign room air. Pt agreed to session. Pt denied pain, nausea, and fatigue. Pt able to exit bed independently. Pt ambulated 160' in hallway with RW. No LOB noted. After ~ 80', pt c/o pain in LLE "3/10". A short standing rest break was taken to manage pain. Pt's O2 difficult to determine d/t long nails with polish. Pulse ox readings stated pt's O2 desat to 87% with HR of 123, but unsure of accuracy. Pt denied SOB. Overall, pt tolerated session well. Pt was left in bed with all needs in reach. Nurse notified.    Filiberto Pinks Mobility Specialist 06/12/20, 5:45 PM

## 2020-06-12 NOTE — Progress Notes (Signed)
PROGRESS NOTE    Stephanie [name]  KDT:[id number] DOB: 11/06/1955 DOA: 06/01/2020 PCP: Pcp, No   Assessment & Plan:   Principal Problem:   Intractable vomiting with nausea Active Problems:   COVID-19   Dehydration   Primary hypertension   AKI (acute kidney injury) (HCC)   Gastroenteritis due to COVID-19 virus   Acute kidney injury (HCC)   Severe anemia     Normocytic anemia: s/p 2 units of pRBCs transfused. Continue to hold plavix. Continue on PPI as per GI. No emergent EGD & can f/u outpatient as per GI. Hx of recurrent mandibular dislocation & high risk for anesthesia as per ENT. S/p colonoscopy which showed 2 polyps in the sigmoid colon that were removed & sent to pathology as well as multiple ulcers found in the rectum which were biopsied. 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  HTN: continue on amlodipine   Vitamin B12 deficiency: resolved  Folate deficiency: resolved  GERD: continue on pantoprazole   Recent COVID-19 infection: dx 05/17/20. Completed isolation course  Hypokalemia: within normal limits today   Hypophosphatemia: WNL today   Constipation: resolved   Vitamin D deficiency: continue on vit D supplements    DVT prophylaxis: SCDs Code Status: full  Family Communication:  Disposition Plan: unclear. Pt does not have any insurance.    Status is: Inpatient  Remains inpatient appropriate because:Ongoing diagnostic testing needed not appropriate for outpatient work up, Unsafe d/c plan and IV treatments appropriate due to intensity of illness or inability to take PO, PT/OT recs SNF but pt does not have insurance     Dispo: The patient is from: Home              Anticipated d/c is to: SNF vs HH               Anticipated d/c date is: unclear              Patient currently is medically stable for d/c   Difficult to place patient Yes  Level of care: medsurg    Consultants:   GI   Procedures:    Antimicrobials:    Subjective: Pt c/o malaise   Objective: Vitals:   06/11/20 1948 06/12/20 0113 06/12/20 0502 06/12/20 0805  BP: 137/88 129/71 (!) 141/81 129/73  Pulse: 88 76 83 71  Resp: 16 17 16 16   Temp: 98.1 F (36.7 C) 98.4 F (36.9 C) 98.4 F (36.9 C) 98.3 F (36.8 C)  TempSrc:      SpO2: (!) 44% 100%  100%  Weight:      Height:        Intake/Output Summary (Last 24 hours) at 06/12/2020 1129 Last data filed at 06/11/2020 1413 Gross per 24 hour  Intake 0 ml  Output -  Net 0 ml   Filed Weights   06/03/20 1126 06/04/20 1436 06/10/20 1406  Weight: 67.1 kg 74.6 kg 74 kg    Examination:  General exam: Appears calm & comfortable  Respiratory system: clear breath sounds b/l. No rales or rhonchi Cardiovascular system: S1 & S2+. No rubs or gallops Gastrointestinal system: Abd is soft, NT, ND & normal bowel sounds  Central nervous  system: Alert and oriented. Moves all 4 extremities  Psychiatry: Judgement and insight appear normal. Flat mood and affect     Data Reviewed: I have personally reviewed following labs and imaging studies  CBC: Recent Labs  Lab 06/06/20 0318 06/07/20 0610 06/08/20 0549 06/09/20 0217 06/10/20 0900 06/11/20 0548 06/12/20 0650  WBC 8.5 7.8 7.7 5.7 4.9 5.1 4.3  NEUTROABS 6.0 5.6  --   --   --   --   --   HGB 7.0* 8.5* 8.0* 8.0* 8.3* 8.2* 7.6*  HCT 20.5* 23.8* 23.8* 23.2* 24.2* 25.0* 23.6*  MCV 86.9 84.4 87.5 85.6 87.7 89.0 89.7  PLT 125* 141* 148* 158 181 201 215   Basic Metabolic Panel: Recent Labs  Lab 06/08/20 0549 06/09/20 0217 06/10/20 0900 06/11/20 0548 06/12/20 0650  NA 138 138 141 141 141  K 3.2* 3.0* 3.6 4.4 4.3  CL 106  102 107 110 109  CO2 25 27 27 25 25   GLUCOSE 87 103* 88 88 84  BUN 9 8 5* 5* 6*  CREATININE 0.88 0.86 0.73 0.81 0.97  CALCIUM 9.3 8.9 9.3 9.3 9.5  MG 1.7 1.7 2.1 2.0 2.0  PHOS 2.2* 4.2 1.9* 1.9* 2.9   GFR: Estimated Creatinine Clearance: 68.5 mL/min (by C-G formula based on SCr of 0.97 mg/dL). Liver Function Tests: No results for input(s): AST, ALT, ALKPHOS, BILITOT, PROT, ALBUMIN in the last 168 hours. No results for input(s): LIPASE, AMYLASE in the last 168 hours. No results for input(s): AMMONIA in the last 168 hours. Coagulation Profile: No results for input(s): INR, PROTIME in the last 168 hours. Cardiac Enzymes: No results for input(s): CKTOTAL, CKMB, CKMBINDEX, TROPONINI in the last 168 hours. BNP (last 3 results) No results for input(s): PROBNP in the last 8760 hours. HbA1C: No results for input(s): HGBA1C in the last 72 hours. CBG: Recent Labs  Lab 06/10/20 1406  GLUCAP 83   Lipid Profile: No results for input(s): CHOL, HDL, LDLCALC, TRIG, CHOLHDL, LDLDIRECT in the last 72 hours. Thyroid Function Tests: No results for input(s): TSH, T4TOTAL, FREET4, T3FREE, THYROIDAB in the last 72 hours. Anemia Panel: No results for input(s): VITAMINB12, FOLATE, FERRITIN, TIBC, IRON, RETICCTPCT in the last 72 hours. Sepsis Labs: No results for input(s): PROCALCITON, LATICACIDVEN in the last 168 hours.  No results found for this or any previous visit (from the past 240 hour(s)).       Radiology Studies: No results found.      Scheduled Meds: . sodium chloride   Intravenous Once  . amLODipine  10 mg Oral Daily  . aspirin EC  81 mg Oral Daily  . atorvastatin  40 mg Oral q1800  . bisacodyl  10 mg Oral QPM  . docusate sodium  100 mg Oral BID  . Ipratropium-Albuterol  1 puff Inhalation Q6H  . mineral oil  1 enema Rectal Once  . multivitamin with minerals  1 tablet Oral Daily  . pantoprazole  40 mg Oral BID  . polyethylene glycol  17 g Oral Daily  . polyethylene  glycol powder  1 Container Oral Once  . simethicone  80 mg Oral QID  . Vitamin D (Ergocalciferol)  50,000 Units Oral Q7 days   Continuous Infusions:    LOS: 10 days    Time spent: 30 mins     06/12/20, MD [facility] Pager [phone]  If 7PM-7AM, please contact night-coverage 06/12/2020, 11:29 AM

## 2020-06-12 NOTE — Anesthesia Postprocedure Evaluation (Signed)
Anesthesia Post Note  Patient: Stephanie [name]  Procedure(s) Performed: COLONOSCOPY WITH PROPOFOL (N/A )  Patient location during evaluation: Endoscopy Anesthesia Type: General Level of consciousness: awake and alert Pain management: pain level controlled Vital Signs Assessment: post-procedure vital signs reviewed and stable Respiratory status: spontaneous breathing, nonlabored ventilation and respiratory function stable Cardiovascular status: blood pressure returned to baseline and stable Postop Assessment: no apparent nausea or vomiting Anesthetic complications: no   No complications documented.   Last Vitals:  Vitals:   06/12/20 0113 06/12/20 0502  BP: 129/71 (!) 141/81  Pulse: 76 83  Resp: 17 16  Temp: 36.9 C 36.9 C  SpO2: 100%     Last Pain:  Vitals:   06/11/20 2007  TempSrc:   PainSc: 0-No pain                 Christia Reading

## 2020-06-12 NOTE — Progress Notes (Signed)
   Melodie Bouillon, MD [address], [place], [place], [address] [address], [place], [place], [address] Phone: [phone]  Fax: [phone]   Subjective: Patient denies any further nausea vomiting. Tolerating oral diet without difficulty   Objective: Exam: Vital signs in last 24 hours: Vitals:   06/12/20 0113 06/12/20 0502 06/12/20 0805 06/12/20 1156  BP: 129/71 (!) 141/81 129/73 127/84  Pulse: 76 83 71 77  Resp: 17 16 16 17   Temp: 98.4 F (36.9 C) 98.4 F (36.9 C) 98.3 F (36.8 C) 98.1 F (36.7 C)  TempSrc:    Oral  SpO2: 100%  100% 99%  Weight:      Height:       Weight change:   Intake/Output Summary (Last 24 hours) at 06/12/2020 1350 Last data filed at 06/12/2020 1349 Gross per 24 hour  Intake 120 ml  Output --  Net 120 ml    General: No acute distress, AAO x3 Abd: Soft, NT/ND, No HSM Skin: Warm, no rashes Neck: Supple, Trachea midline   Lab Results: Lab Results  Component Value Date   WBC 4.3 06/12/2020   HGB 7.6 (L) 06/12/2020   HCT 23.6 (L) 06/12/2020   MCV 89.7 06/12/2020   PLT 215 06/12/2020   Micro Results: No results found for this or any previous visit (from the past 240 hour(s)). Studies/Results: No results found. Medications:  Scheduled Meds: . sodium chloride   Intravenous Once  . amLODipine  10 mg Oral Daily  . aspirin EC  81 mg Oral Daily  . atorvastatin  40 mg Oral q1800  . bisacodyl  10 mg Oral QPM  . docusate sodium  100 mg Oral BID  . Ipratropium-Albuterol  1 puff Inhalation Q6H  . mineral oil  1 enema Rectal Once  . multivitamin with minerals  1 tablet Oral Daily  . pantoprazole  40 mg Oral BID  . polyethylene glycol  17 g Oral Daily  . polyethylene glycol powder  1 Container Oral Once  . simethicone  80 mg Oral QID  . Vitamin D (Ergocalciferol)  50,000 Units Oral Q7 days   Continuous Infusions: PRN Meds:.acetaminophen **OR** acetaminophen, bisacodyl, hydrALAZINE, ondansetron **OR**  ondansetron (ZOFRAN) IV   Assessment: Principal Problem:   Intractable vomiting with nausea Active Problems:   COVID-19   Dehydration   Primary hypertension   AKI (acute kidney injury) (HCC)   Gastroenteritis due to COVID-19 virus   Acute kidney injury (HCC)   Severe anemia    Plan: Nausea vomiting has completely resolved Patient to follow-up in GI clinic closely after discharge No signs of active GI bleeding Continue monitoring CBC as you are  Please page GI with any signs of active GI bleeding or any other concerns or questions   LOS: 10 days   06/14/2020, MD 06/12/2020, 1:50 PM

## 2020-06-12 NOTE — Progress Notes (Signed)
SLP Cancellation Note  Patient Details Name: Stephanie [name] MRN: [id number] DOB: 09/11/1955   Cancelled treatment:       Reason Eval/Treat Not Completed: SLP screened, no needs identified, will sign off (chart reviewed; consulted NSG/MD re: pt's status this admit).  Received order for BSE; pt was just recently evaluated and treated by ST services on 1/18-19/2022 and determined to have no oropharyngeal phase dysphagia; pt experienced a mandibular dislocation requiring closed reduction by ENT. She then tolerated a mech soft diet/regular diet w/out difficulty. Pt is also being followed by GI for N/V and was recently NPO post f/u w/ GI. BSE order was received this morning. Discussed w/ MD pt's Baseline oropharyngeal swallowing and option of initiating a mech soft diet w/ thin liquids w/ NSG supervision initially. Agreed that ST services would gladly f/u w/ pt for a BSE IF any swallowing issues are noted w/ the diet.  In speaking w/ NSG post Lunch meal, pt experienced no swallowing difficulties w/ the meal, thin liquids. NSG has no concerns currently.  ST services will sign off at this time w/ NSG/MD to reconsult if any new swallowing issues arise during admit. Recommend continued general aspiration precautions. NSG/MD agreed.     Jerilynn Som, MS, CCC-SLP Speech Language Pathologist Rehab Services [phone] [facility] 06/12/2020, 3:51 PM

## 2020-06-13 LAB — CBC
HCT: 23.9 % — ABNORMAL LOW (ref 36.0–46.0)
Hemoglobin: 7.7 g/dL — ABNORMAL LOW (ref 12.0–15.0)
MCH: 28.6 pg (ref 26.0–34.0)
MCHC: 32.2 g/dL (ref 30.0–36.0)
MCV: 88.8 fL (ref 80.0–100.0)
Platelets: 242 10*3/uL (ref 150–400)
RBC: 2.69 MIL/uL — ABNORMAL LOW (ref 3.87–5.11)
RDW: 14.8 % (ref 11.5–15.5)
WBC: 5.2 10*3/uL (ref 4.0–10.5)
nRBC: 0 % (ref 0.0–0.2)

## 2020-06-13 LAB — BASIC METABOLIC PANEL
Anion gap: 5 (ref 5–15)
BUN: 9 mg/dL (ref 8–23)
CO2: 27 mmol/L (ref 22–32)
Calcium: 9.6 mg/dL (ref 8.9–10.3)
Chloride: 109 mmol/L (ref 98–111)
Creatinine, Ser: 1.12 mg/dL — ABNORMAL HIGH (ref 0.44–1.00)
GFR, Estimated: 55 mL/min — ABNORMAL LOW (ref >60)
Glucose, Bld: 103 mg/dL — ABNORMAL HIGH (ref 70–99)
Potassium: 4.2 mmol/L (ref 3.5–5.1)
Sodium: 141 mmol/L (ref 135–145)

## 2020-06-13 LAB — PHOSPHORUS: Phosphorus: 3.2 mg/dL (ref 2.5–4.6)

## 2020-06-13 LAB — MAGNESIUM: Magnesium: 1.9 mg/dL (ref 1.7–2.4)

## 2020-06-13 MED ORDER — PROSOURCE PLUS PO LIQD
30.0000 mL | Freq: Two times a day (BID) | ORAL | Status: DC
  Administered 2020-06-13 – 2020-06-17 (×4): 30 mL via ORAL
  Filled 2020-06-13: qty 30

## 2020-06-13 NOTE — Progress Notes (Signed)
   Melodie Bouillon, MD [address], [place], [place], [address] [address], [place], [place], [address] Phone: [phone]  Fax: [phone]   Subjective: Patient tolerating oral diet without difficulty.  No further nausea or vomiting.   Objective: Exam: Vital signs in last 24 hours: Vitals:   06/12/20 1540 06/12/20 2014 06/13/20 0454 06/13/20 0954  BP: 139/87 135/78 124/66 139/75  Pulse: 84 72 80 87  Resp: 16  18 20   Temp: 98.5 F (36.9 C) 98.8 F (37.1 C) 98.1 F (36.7 C) (!) 97.5 F (36.4 C)  TempSrc: Oral Oral    SpO2: 100% 99% 98% 98%  Weight:      Height:       Weight change:   Intake/Output Summary (Last 24 hours) at 06/13/2020 1313 Last data filed at 06/13/2020 0900 Gross per 24 hour  Intake 120 ml  Output --  Net 120 ml    General: No acute distress, AAO x3 Abd: Soft, NT/ND, No HSM Skin: Warm, no rashes Neck: Supple, Trachea midline   Lab Results: Lab Results  Component Value Date   WBC 5.2 06/13/2020   HGB 7.7 (L) 06/13/2020   HCT 23.9 (L) 06/13/2020   MCV 88.8 06/13/2020   PLT 242 06/13/2020   Micro Results: No results found for this or any previous visit (from the past 240 hour(s)). Studies/Results: No results found. Medications:  Scheduled Meds: . (feeding supplement) PROSource Plus  30 mL Oral BID BM  . sodium chloride   Intravenous Once  . amLODipine  10 mg Oral Daily  . aspirin EC  81 mg Oral Daily  . atorvastatin  40 mg Oral q1800  . bisacodyl  10 mg Oral QPM  . docusate sodium  100 mg Oral BID  . Ipratropium-Albuterol  1 puff Inhalation Q6H  . mineral oil  1 enema Rectal Once  . multivitamin with minerals  1 tablet Oral Daily  . pantoprazole  40 mg Oral BID  . polyethylene glycol  17 g Oral Daily  . polyethylene glycol powder  1 Container Oral Once  . simethicone  80 mg Oral QID  . Vitamin D (Ergocalciferol)  50,000 Units Oral Q7 days   Continuous Infusions: PRN Meds:.acetaminophen **OR**  acetaminophen, bisacodyl, hydrALAZINE, ondansetron **OR** ondansetron (ZOFRAN) IV   Assessment: Principal Problem:   Intractable vomiting with nausea Active Problems:   COVID-19   Dehydration   Primary hypertension   AKI (acute kidney injury) (HCC)   Gastroenteritis due to COVID-19 virus   Acute kidney injury (HCC)   Severe anemia    Plan: Nausea vomiting completely resolved Patient should follow-up in GI clinic within 3 to 4 weeks of discharge.  Please set up at time of discharge Hemoglobin stable Please page GI with any signs of active GI bleeding GI service will sign off at this time   LOS: 11 days   06/15/2020, MD 06/13/2020, 1:13 PM

## 2020-06-13 NOTE — Progress Notes (Signed)
Occupational Therapy Treatment Patient Details Name: Stephanie [name] MRN: [id number] DOB: 02/26/1956 Today's Date: 06/13/2020    History of present illness Patient is a 65 year old female who presents for nausea and vomiting. 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 Discharged from hospital on 05/29/20. Per documentation patient had a fall cleaning a toilet at work with anterior dislocation of mandiublar conydles which required closed reduction 05/23/20. Received transfusion while hospitalized.   OT comments  Upon entering the room, pt supine in bed with and required maximal encouragement to participate. Pt did just finish with physical therapy and OT educating pt on the difference between both therapies and encouraged her to participate. Pt performing bed mobility without assistance and ambulates with RW and min guard to bathroom. Pt performed toileting with min guard and removed clothing items from while seated on commode. OT covered IV and pt transferred onto 3 in 1 commode chair in shower. Pt bathing with close supervision while seated and educated on lateral leans to wash buttocks. Pt exiting the shower in same manner as above and seated on EOB to don clothing items with min guard for standing balance. Pt very happy she participated in OT intervention this session. Pt continues to benefit from OT intervention. All needs within reach.    Follow Up Recommendations  SNF    Equipment Recommendations  3 in 1 bedside commode;Other (comment)       Precautions / Restrictions Precautions Precautions: Fall Restrictions Weight Bearing Restrictions: No       Mobility Bed Mobility Overal bed mobility: Independent Bed Mobility: Rolling Rolling: Independent   Supine to sit: Independent Sit to supine: Independent   General bed mobility comments: No physical A needed during bed mobility, pt demonstrated  safety awareness and stability throughout supine<>sit transfer  Transfers Overall transfer level: Needs assistance Equipment used: Rolling walker (2 wheeled) Transfers: Sit to/from [employer] Sit to Stand: Min guard Stand pivot transfers: Min guard       General transfer comment: Min/CGA for safety and fall prevention    Balance Overall balance assessment: Needs assistance Sitting-balance support: Feet supported;No upper extremity supported Sitting balance-Leahy Scale: Good Sitting balance - Comments: Pt able to sit EOB safely completing reaching activities with no LOB   Standing balance support: Bilateral upper extremity supported;During functional activity Standing balance-Leahy Scale: Fair Standing balance comment: Pt requires BIL UE support to maintain upright position safely without fall                           ADL either performed or assessed with clinical judgement   ADL Overall ADL's : Needs assistance/impaired             Lower Body Bathing: Supervison/ safety;Sitting/lateral leans   Upper Body Dressing : Sitting;Supervision/safety   Lower Body Dressing: Min guard;Sit to/from stand   Toilet Transfer: Min guard;RW   Toileting- Architect and Hygiene: Min guard;Sit to/from stand   Tub/ Shower Transfer: Min guard;3 in [employer] walker;Ambulation           Vision Patient Visual Report: No change from baseline            Cognition Arousal/Alertness: Awake/alert Behavior During Therapy: WFL for tasks assessed/performed Overall Cognitive Status: Within Functional Limits for tasks assessed Area of Impairment: Safety/judgement  Following Commands: Follows multi-step commands consistently       General Comments: Pt alert and oreinted and able understand and repsond to cues accordingly        Exercises General Exercises - Lower Extremity Long Arc Quad: AROM;Strengthening;Both;10  reps (5 second hold) Hip Flexion/Marching: AROM;Strengthening;Both;20 reps (Alternating seated marching) Other Exercises Other Exercises: Sit<>stand x5 for 2 rounds, seated rest break in between sets. Pt able to come to the standing position establish her balance and complete sitting activity safely with SBA. Other Exercises: Educated pt on the importance of completing sit<>stands and walking regularly to maintain her LE strength - pt verbalized understanding           Pertinent Vitals/ Pain       Pain Assessment: No/denies pain         Frequency  Min 1X/week        Progress Toward Goals  OT Goals(current goals can now be found in the care plan section)  Progress towards OT goals: Progressing toward goals  Acute Rehab OT Goals Patient Stated Goal: to get stronger and get out of the hospital OT Goal Formulation: With patient Time For Goal Achievement: 06/20/20 Potential to Achieve Goals: Good  Plan Frequency remains appropriate;Discharge plan remains appropriate       AM-PAC OT "6 Clicks" Daily Activity     Outcome Measure   Help from another person eating meals?: A Little Help from another person taking care of personal grooming?: A Little Help from another person toileting, which includes using toliet, bedpan, or urinal?: A Little Help from another person bathing (including washing, rinsing, drying)?: A Little Help from another person to put on and taking off regular upper body clothing?: A Little Help from another person to put on and taking off regular lower body clothing?: A Little 6 Click Score: 18    End of Session    OT Visit Diagnosis: Unsteadiness on feet (R26.81);Repeated falls (R29.6);Muscle weakness (generalized) (M62.81)   Activity Tolerance Patient tolerated treatment well   Patient Left in bed;with call bell/phone within reach;with bed alarm set   Nurse Communication          Time: 2831-5176 OT Time Calculation (min): 42 min  Charges: OT  General Charges $OT Visit: 1 Visit OT Treatments $Self Care/Home Management : 38-52 mins  Jackquline Denmark, MS, OTR/L , CBIS ascom [phone]  06/13/20, 5:05 PM

## 2020-06-13 NOTE — Progress Notes (Signed)
Nutrition Follow-up  DOCUMENTATION CODES:   Not applicable  INTERVENTION:  D/c Magic Cup due to pt dislike  ProSource Plus 30 mg po BID, each supplement provides 100 kcal and 15 grams of protein  MVI with minerals po daily   NUTRITION DIAGNOSIS:   Inadequate oral intake related to decreased appetite,nausea,vomiting as evidenced by per patient/family report. -ongoing  GOAL:   Patient will meet greater than or equal to 90% of their needs -progressing   MONITOR:   PO intake,Diet advancement,Supplement acceptance,Labs,Weight trends,I & O's  REASON FOR ASSESSMENT:   Malnutrition Screening Tool    ASSESSMENT:   65 year old female with PMHx of DM, recent acute CVA, GI bleed, COVID-19 infection, recent bilateral mandibular dislocation s/p closed reduction of mandibular dislocation bilaterally on 05/23/2020 who is admitted with intractable N/V due to COVID-19 gastroenteritis, AKI, and recurrent dislocated jaw s/p closed reduction of mandibular dislocation on 06/03/2020.  Pt awake standing at bedside talking on the phone this morning. She reports doing well today, states her appetite is improved now that she can eat "real food" and endorses eating everything except for the ice cream for breakfast this morning. Pt reports poor intake on dysphagia 1, says she is a picky eater and did not like most of the foods provided. Pt does not like cheese, waffles, or ice cream. Pt endorses weight loss over the past couple months. She recalls usual weight 180-185 lbs. Currently, pt weighs 74.2 kg and ~17 lbs (9.6%) under her usual weight; significant. Pt has tried Ensure during admission and does not care for them. RD discussed increased needs and the importance of adequate protein intake. Patient is agreeable to trying ProSource Plus to aid with meeting needs.   I/Os: +1574 ml since admit  Medications reviewed and include: MVI, Colace, Dulcolax, Miralax, Mylicon  Labs reviwed   Diet Order:    Diet Order            DIET DYS 3 Room service appropriate? Yes with Assist; Fluid consistency: Thin  Diet effective now                 EDUCATION NEEDS:   No education needs have been identified at this time  Skin:  Skin Assessment: Skin Integrity Issues: Skin Integrity Issues:: Incisions Incisions: closed incision to lip  Last BM:  1/28 - type 3 (brown;large)  Height:   Ht Readings from Last 1 Encounters:  06/10/20 6' (1.829 m)    Weight:   Wt Readings from Last 1 Encounters:  06/10/20 74 kg    BMI:  Body mass index is 22.13 kg/m.  Estimated Nutritional Needs:   Kcal:  1900-2100  Protein:  95-105 grams  Fluid:  >/= 2.2 L/day   Lars Masson, RD, LDN Clinical Nutrition After Hours/Weekend Pager # in Amion

## 2020-06-13 NOTE — Progress Notes (Signed)
PROGRESS NOTE    Stephanie [name]  IEP:[id number] DOB: 10-18-1955 DOA: 06/01/2020 PCP: Pcp, No   Assessment & Plan:   Principal Problem:   Intractable vomiting with nausea Active Problems:   COVID-19   Dehydration   Primary hypertension   AKI (acute kidney injury) (HCC)   Gastroenteritis due to COVID-19 virus   Acute kidney injury (HCC)   Severe anemia     Normocytic anemia: H&H are stable. S/p 2 units of pRBCs transfused. Continue to hold plavix. Continue on PPI as per GI. No emergent EGD & can f/u outpatient as per GI. Hx of recurrent mandibular dislocation & high risk for anesthesia as per ENT. S/p colonoscopy which showed 2 polyps in the sigmoid colon that were removed & sent to pathology as well as multiple ulcers found in the rectum which were biopsied. 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  HTN: continue on amlodipine   Vitamin B12 deficiency: resolved  Folate deficiency: resolved  GERD: continue on PPI   Recent COVID-19 infection: dx 05/17/20. Completed isolation course  Hypokalemia: WNL today   Hypophosphatemia: within normal limits today   Constipation: resolved   Vitamin D deficiency: continue on vit D supplements   DVT prophylaxis: SCDs Code Status: full  Family Communication:  Disposition Plan: unclear. Pt does not have any insurance.    Status is: Inpatient  Remains inpatient appropriate because:Ongoing diagnostic testing needed not appropriate for outpatient work up, Unsafe d/c plan and IV treatments appropriate due to intensity of illness or inability to take PO, PT/OT recs SNF  but pt does not have insurance    Dispo: The patient is from: Home              Anticipated d/c is to: SNF vs HH               Anticipated d/c date is: unclear              Patient currently is medically stable for d/c   Difficult to place patient Yes  Level of care: medsurg    Consultants:   GI   Procedures:    Antimicrobials:    Subjective: Pt c/o fatigue  Objective: Vitals:   06/12/20 1156 06/12/20 1540 06/12/20 2014 06/13/20 0454  BP: 127/84 139/87 135/78 124/66  Pulse: 77 84 72 80  Resp: 17 16  18   Temp: 98.1 F (36.7 C) 98.5 F (36.9 C) 98.8 F (37.1 C) 98.1 F (36.7 C)  TempSrc: Oral Oral Oral   SpO2: 99% 100% 99% 98%  Weight:      Height:        Intake/Output Summary (Last 24 hours) at 06/13/2020 0752 Last data filed at 06/12/2020 1700 Gross per 24 hour  Intake 120 ml  Output -  Net 120 ml   Filed Weights   06/03/20 1126 06/04/20 1436 06/10/20 1406  Weight: 67.1 kg 74.6 kg 74 kg    Examination:  General exam: Appears comfortable   Respiratory system: clear breath sounds b/l  Cardiovascular system: S1/S2+. No rubs or gallops Gastrointestinal system: Abd is soft, NT, ND & normal bowel sounds  Central nervous system: Alert and  oriented. Moves all 4 extremities  Psychiatry: Judgement and insight appear normal. Flat mood and affect     Data Reviewed: I have personally reviewed following labs and imaging studies  CBC: Recent Labs  Lab 06/07/20 0610 06/08/20 0549 06/09/20 0217 06/10/20 0900 06/11/20 0548 06/12/20 0650 06/13/20 0341  WBC 7.8   < > 5.7 4.9 5.1 4.3 5.2  NEUTROABS 5.6  --   --   --   --   --   --   HGB 8.5*   < > 8.0* 8.3* 8.2* 7.6* 7.7*  HCT 23.8*   < > 23.2* 24.2* 25.0* 23.6* 23.9*  MCV 84.4   < > 85.6 87.7 89.0 89.7 88.8  PLT 141*   < > 158 181 201 215 242   < > = values in this interval not displayed.   Basic Metabolic Panel: Recent Labs  Lab 06/09/20 0217 06/10/20 0900 06/11/20 0548 06/12/20 0650  06/13/20 0341  NA 138 141 141 141 141  K 3.0* 3.6 4.4 4.3 4.2  CL 102 107 110 109 109  CO2 27 27 25 25 27   GLUCOSE 103* 88 88 84 103*  BUN 8 5* 5* 6* 9  CREATININE 0.86 0.73 0.81 0.97 1.12*  CALCIUM 8.9 9.3 9.3 9.5 9.6  MG 1.7 2.1 2.0 2.0 1.9  PHOS 4.2 1.9* 1.9* 2.9 3.2   GFR: Estimated Creatinine Clearance: 59.3 mL/min (A) (by C-G formula based on SCr of 1.12 mg/dL (H)). Liver Function Tests: No results for input(s): AST, ALT, ALKPHOS, BILITOT, PROT, ALBUMIN in the last 168 hours. No results for input(s): LIPASE, AMYLASE in the last 168 hours. No results for input(s): AMMONIA in the last 168 hours. Coagulation Profile: No results for input(s): INR, PROTIME in the last 168 hours. Cardiac Enzymes: No results for input(s): CKTOTAL, CKMB, CKMBINDEX, TROPONINI in the last 168 hours. BNP (last 3 results) No results for input(s): PROBNP in the last 8760 hours. HbA1C: No results for input(s): HGBA1C in the last 72 hours. CBG: Recent Labs  Lab 06/10/20 1406  GLUCAP 83   Lipid Profile: No results for input(s): CHOL, HDL, LDLCALC, TRIG, CHOLHDL, LDLDIRECT in the last 72 hours. Thyroid Function Tests: No results for input(s): TSH, T4TOTAL, FREET4, T3FREE, THYROIDAB in the last 72 hours. Anemia Panel: No results for input(s): VITAMINB12, FOLATE, FERRITIN, TIBC, IRON, RETICCTPCT in the last 72 hours. Sepsis Labs: No results for input(s): PROCALCITON, LATICACIDVEN in the last 168 hours.  No results found for this or any previous visit (from the past 240 hour(s)).       Radiology Studies: No results found.      Scheduled Meds: . sodium chloride   Intravenous Once  . amLODipine  10 mg Oral Daily  . aspirin EC  81 mg Oral Daily  . atorvastatin  40 mg Oral q1800  . bisacodyl  10 mg Oral QPM  . docusate sodium  100 mg Oral BID  . Ipratropium-Albuterol  1 puff Inhalation Q6H  . mineral oil  1 enema Rectal Once  . multivitamin with minerals  1 tablet Oral Daily  .  pantoprazole  40 mg Oral BID  . polyethylene glycol  17 g Oral Daily  . polyethylene glycol powder  1 Container Oral Once  . simethicone  80 mg Oral QID  . Vitamin D (Ergocalciferol)  50,000 Units Oral Q7 days   Continuous Infusions:    LOS: 11 days    Time spent: 32 mins     06/12/20,  MD [facility] Pager [phone]  If 7PM-7AM, please contact night-coverage 06/13/2020, 7:52 AM

## 2020-06-13 NOTE — TOC Progression Note (Signed)
Transition of Care [facility]) - Progression Note    Patient Details  Name: Stephanie [name] MRN: [id number] Date of Birth: 1955/12/09  Transition of Care [facility]) CM/SW Contact  Trenton Founds, RN Phone Number: 06/13/2020, 3:14 PM  Clinical Narrative:   RNCM reached out to patient's aunt Stephanie [name] per her request. Ms. Stephanie [name] again reports that she tried to take care of her but she is unable to due to her own physical limitations. She relays that as much as she wants to she will not be able to bring patient to her home. RNCM relayed that per MD report patient is working with her cousin in attempt to find somewhere for her to go.     Expected Discharge Plan: Skilled Nursing Facility Barriers to Discharge: Financial Resources  Expected Discharge Plan and Services Expected Discharge Plan: Skilled Nursing Facility       Living arrangements for the past 2 months: Apartment Expected Discharge Date: 06/05/20                                     Social Determinants of Health (SDOH) Interventions    Readmission Risk Interventions No flowsheet data found.

## 2020-06-13 NOTE — Progress Notes (Signed)
Physical Therapy Treatment Patient Details Name: Stephanie [name] MRN: [id number] DOB: Nov 25, 1955 Today's Date: 06/13/2020    History of Present Illness Patient is a 65 year old female who presents for nausea and vomiting. 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 Discharged from hospital on 05/29/20. Per documentation patient had a fall cleaning a toilet at work with anterior dislocation of mandiublar conydles which required closed reduction 05/23/20. Received transfusion while hospitalized.    PT Comments    Pt received seated in her bed alert and oriented, willing to participate with PT interventions today. Pt able to complete supine<>sit transfer I safely with no LOB and sit EOB with feet and BIL UE supported. Pt required CGA for sit<>stand transfer for safety, hand placement and RW management. Completed 250ft of ambulation using the RW and CGA for safety, VCs provided to patient for appropriate RW management as she tends to place too much weight through her hands. Pt verbalized increased fatigue following ambulation today and requested a seated rest break. Completed seated exercises including: LAQ x10 each leg, sit<>stand x5 2 rounds with one seated rest break due to fatigue, seated alternating knee marches x20.  Pt reports increased fatigue and soreness in her quad following interventions today. Educated pt on HEP, pt verbalized understanding. Overall pt tolerated therapy well today, with some noted increase in fatigue.  Follow Up Recommendations  SNF     Equipment Recommendations  Rolling walker with 5" wheels    Recommendations for Other Services       Precautions / Restrictions Precautions Precautions: Fall Restrictions Weight Bearing Restrictions: No    Mobility  Bed Mobility Overal bed mobility: Independent Bed Mobility: Rolling Rolling: Independent   Supine to sit: Independent Sit  to supine: Independent   General bed mobility comments: No physical A needed during bed mobility, pt demonstrated safety awareness and stability throughout supine<>sit transfer  Transfers Overall transfer level: Needs assistance Equipment used: Rolling walker (2 wheeled) Transfers: Sit to/from [employer] Sit to Stand: Min guard;Min assist         General transfer comment: Min/CGA for safety and fall prevention  Ambulation/Gait Ambulation/Gait assistance: Min guard Gait Distance (Feet): 200 Feet Assistive device: Rolling walker (2 wheeled) Gait Pattern/deviations: Step-through pattern Gait velocity: decreased   General Gait Details: Pt requires min verbal cues for posture and RW management during ambulation, she tends to place too much weight through the walker during ambulation            Balance Overall balance assessment: Needs assistance Sitting-balance support: Feet supported;No upper extremity supported Sitting balance-Leahy Scale: Good Sitting balance - Comments: Pt able to sit EOB safely completing reaching activities with no LOB   Standing balance support: Bilateral upper extremity supported;During functional activity Standing balance-Leahy Scale: Fair Standing balance comment: Pt requires BIL UE support to maintain upright position safely without fall                            Cognition Arousal/Alertness: Awake/alert Behavior During Therapy: WFL for tasks assessed/performed Overall Cognitive Status: Within Functional Limits for tasks assessed Area of Impairment: Safety/judgement                       Following Commands: Follows multi-step commands consistently       General Comments: Pt alert and oreinted and able understand and repsond  to cues accordingly      Exercises General Exercises - Lower Extremity Long Arc Quad: AROM;Strengthening;Both;10 reps (5 second hold) Hip Flexion/Marching:  AROM;Strengthening;Both;20 reps (Alternating seated marching) Other Exercises Other Exercises: Sit<>stand x5 for 2 rounds, seated rest break in between sets. Pt able to come to the standing position establish her balance and complete sitting activity safely with SBA. Other Exercises: Educated pt on the importance of completing sit<>stands and walking regularly to maintain her LE strength - pt verbalized understanding    General Comments        Pertinent Vitals/Pain Pain Assessment: No/denies pain    Home Living                      Prior Function            PT Goals (current goals can now be found in the care plan section) Acute Rehab PT Goals Patient Stated Goal: to get stronger and get out of the hospital PT Goal Formulation: With patient Time For Goal Achievement: 06/20/20 Potential to Achieve Goals: Good Progress towards PT goals: Progressing toward goals    Frequency    Min 2X/week      PT Plan Current plan remains appropriate    Co-evaluation              AM-PAC PT "6 Clicks" Mobility   Outcome Measure  Help needed turning from your back to your side while in a flat bed without using bedrails?: A Little Help needed moving from lying on your back to sitting on the side of a flat bed without using bedrails?: A Little Help needed moving to and from a bed to a chair (including a wheelchair)?: A Little Help needed standing up from a chair using your arms (e.g., wheelchair or bedside chair)?: A Little Help needed to walk in hospital room?: A Little Help needed climbing 3-5 steps with a railing? : A Lot 6 Click Score: 17    End of Session Equipment Utilized During Treatment: Gait belt Activity Tolerance: Patient tolerated treatment well Patient left: in chair;with call bell/phone within reach   PT Visit Diagnosis: Unsteadiness on feet (R26.81);Other abnormalities of gait and mobility (R26.89);Muscle weakness (generalized) (M62.81);History of falling  (Z91.81);Difficulty in walking, not elsewhere classified (R26.2)     Time: 5885-0277 PT Time Calculation (min) (ACUTE ONLY): 27 min  Charges:  $Gait Training: 8-22 mins $Therapeutic Exercise: 8-22 mins                   Minette Headland, PT, DPT 06/13/20, 2:17 PM    Alliene Klugh Marcelle Overlie 06/13/2020, 2:11 PM

## 2020-06-14 LAB — BASIC METABOLIC PANEL
Anion gap: 7 (ref 5–15)
BUN: 11 mg/dL (ref 8–23)
CO2: 27 mmol/L (ref 22–32)
Calcium: 9.9 mg/dL (ref 8.9–10.3)
Chloride: 108 mmol/L (ref 98–111)
Creatinine, Ser: 0.97 mg/dL (ref 0.44–1.00)
GFR, Estimated: >60 mL/min (ref >60)
Glucose, Bld: 101 mg/dL — ABNORMAL HIGH (ref 70–99)
Potassium: 4 mmol/L (ref 3.5–5.1)
Sodium: 142 mmol/L (ref 135–145)

## 2020-06-14 LAB — CBC
HCT: 25.1 % — ABNORMAL LOW (ref 36.0–46.0)
Hemoglobin: 8.4 g/dL — ABNORMAL LOW (ref 12.0–15.0)
MCH: 29.5 pg (ref 26.0–34.0)
MCHC: 33.5 g/dL (ref 30.0–36.0)
MCV: 88.1 fL (ref 80.0–100.0)
Platelets: 264 10*3/uL (ref 150–400)
RBC: 2.85 MIL/uL — ABNORMAL LOW (ref 3.87–5.11)
RDW: 14.6 % (ref 11.5–15.5)
WBC: 5.6 10*3/uL (ref 4.0–10.5)
nRBC: 0 % (ref 0.0–0.2)

## 2020-06-14 LAB — PHOSPHORUS: Phosphorus: 3.4 mg/dL (ref 2.5–4.6)

## 2020-06-14 LAB — MAGNESIUM: Magnesium: 2.1 mg/dL (ref 1.7–2.4)

## 2020-06-14 NOTE — Plan of Care (Signed)

## 2020-06-14 NOTE — Progress Notes (Signed)
PROGRESS NOTE    Stephanie [name]  FMB:[id number] DOB: 1955/08/15 DOA: 06/01/2020 PCP: Pcp, No   Assessment & Plan:   Principal Problem:   Intractable vomiting with nausea Active Problems:   COVID-19   Dehydration   Primary hypertension   AKI (acute kidney injury) (HCC)   Gastroenteritis due to COVID-19 virus   Acute kidney injury (HCC)   Severe anemia     Normocytic anemia: H&H are trending up today. S/p 2 units of pRBCs transfused. Continue to hold plavix. Continue on PPI. No emergent EGD & can f/u outpatient as per GI. Hx of recurrent mandibular dislocation & high risk for anesthesia as per ENT. S/p colonoscopy which showed 2 polyps in the sigmoid colon that were removed & sent to pathology as well as multiple ulcers found in the rectum which were biopsied. Pathology is neg for dysplasia and malignancy   Unintentional weight loss: CT chest/abd/pelvis shows need for repeat CT in 3-6 months to assess degree of underlying scarring or fibrosis if desired, emphysema & moderate burden of stool   Generalized weakness: PT/OT recs SNF but pt does not have insurance   Recurrent mandibular dislocation: s/p closed reduction mandibular dislocation on 06/03/20.  HTN: continue on CCB   Vitamin B12 deficiency: resolved  Folate deficiency: resolved  GERD: continue on pantoprazole   Recent COVID-19 infection: dx 05/17/20. Completed isolation course  Hypokalemia: within normal limits today   Hypophosphatemia: WNL   Constipation: resolved   Vitamin D deficiency: continue on vitamin D supplements   DVT prophylaxis: SCDs Code Status: full  Family Communication:  Disposition Plan: unclear. Pt does not have any insurance.    Status is: Inpatient  Remains inpatient appropriate because:Ongoing diagnostic testing needed not appropriate for outpatient work up, Unsafe d/c plan and IV treatments appropriate due to intensity of illness or inability to take PO, PT/OT recs SNF but  pt does not have insurance    Dispo: The patient is from: Home              Anticipated d/c is to: SNF vs HH               Anticipated d/c date is: unclear              Patient currently is medically stable for d/c   Difficult to place patient Yes  Level of care: medsurg    Consultants:   GI   Procedures:    Antimicrobials:    Subjective: Pt c/o malaise   Objective: Vitals:   06/13/20 0454 06/13/20 0954 06/13/20 2348 06/14/20 0517  BP: 124/66 139/75 132/76 135/82  Pulse: 80 87 80 78  Resp: 18 20 18 17   Temp: 98.1 F (36.7 C) (!) 97.5 F (36.4 C) 98.3 F (36.8 C) 98.3 F (36.8 C)  TempSrc:      SpO2: 98% 98% 100% 98%  Weight:      Height:        Intake/Output Summary (Last 24 hours) at 06/14/2020 0752 Last data filed at 06/14/2020 0400 Gross per 24 hour  Intake 600 ml  Output -  Net 600 ml   Filed Weights   06/03/20 1126 06/04/20 1436 06/10/20 1406  Weight: 67.1 kg 74.6 kg 74 kg    Examination:  General exam: Appears calm & comfortable   Respiratory system: clear breath sounds b/l. No rales, rhonchi or wheezes  Cardiovascular system: S1 & S2+. No clicks or rubs Gastrointestinal system: Abd is soft, ND, NT & normal  bowel sounds  Central nervous system: Alert and oriented. Moves all 4 extremities  Psychiatry: Judgement and insight appear normal. Flat mood and affect     Data Reviewed: I have personally reviewed following labs and imaging studies  CBC: Recent Labs  Lab 06/10/20 0900 06/11/20 0548 06/12/20 0650 06/13/20 0341 06/14/20 0525  WBC 4.9 5.1 4.3 5.2 5.6  HGB 8.3* 8.2* 7.6* 7.7* 8.4*  HCT 24.2* 25.0* 23.6* 23.9* 25.1*  MCV 87.7 89.0 89.7 88.8 88.1  PLT 181 201 215 242 264   Basic Metabolic Panel: Recent Labs  Lab 06/10/20 0900 06/11/20 0548 06/12/20 0650 06/13/20 0341 06/14/20 0525  NA 141 141 141 141 142  K 3.6 4.4 4.3 4.2 4.0  CL 107 110 109 109 108  CO2 27 25 25 27 27   GLUCOSE 88 88 84 103* 101*  BUN 5* 5* 6* 9 11   CREATININE 0.73 0.81 0.97 1.12* 0.97  CALCIUM 9.3 9.3 9.5 9.6 9.9  MG 2.1 2.0 2.0 1.9 2.1  PHOS 1.9* 1.9* 2.9 3.2 3.4   GFR: Estimated Creatinine Clearance: 68.5 mL/min (by C-G formula based on SCr of 0.97 mg/dL). Liver Function Tests: No results for input(s): AST, ALT, ALKPHOS, BILITOT, PROT, ALBUMIN in the last 168 hours. No results for input(s): LIPASE, AMYLASE in the last 168 hours. No results for input(s): AMMONIA in the last 168 hours. Coagulation Profile: No results for input(s): INR, PROTIME in the last 168 hours. Cardiac Enzymes: No results for input(s): CKTOTAL, CKMB, CKMBINDEX, TROPONINI in the last 168 hours. BNP (last 3 results) No results for input(s): PROBNP in the last 8760 hours. HbA1C: No results for input(s): HGBA1C in the last 72 hours. CBG: Recent Labs  Lab 06/10/20 1406  GLUCAP 83   Lipid Profile: No results for input(s): CHOL, HDL, LDLCALC, TRIG, CHOLHDL, LDLDIRECT in the last 72 hours. Thyroid Function Tests: No results for input(s): TSH, T4TOTAL, FREET4, T3FREE, THYROIDAB in the last 72 hours. Anemia Panel: No results for input(s): VITAMINB12, FOLATE, FERRITIN, TIBC, IRON, RETICCTPCT in the last 72 hours. Sepsis Labs: No results for input(s): PROCALCITON, LATICACIDVEN in the last 168 hours.  No results found for this or any previous visit (from the past 240 hour(s)).       Radiology Studies: No results found.      Scheduled Meds: . (feeding supplement) PROSource Plus  30 mL Oral BID BM  . sodium chloride   Intravenous Once  . amLODipine  10 mg Oral Daily  . aspirin EC  81 mg Oral Daily  . atorvastatin  40 mg Oral q1800  . bisacodyl  10 mg Oral QPM  . docusate sodium  100 mg Oral BID  . Ipratropium-Albuterol  1 puff Inhalation Q6H  . mineral oil  1 enema Rectal Once  . multivitamin with minerals  1 tablet Oral Daily  . pantoprazole  40 mg Oral BID  . polyethylene glycol  17 g Oral Daily  . polyethylene glycol powder  1 Container  Oral Once  . simethicone  80 mg Oral QID  . Vitamin D (Ergocalciferol)  50,000 Units Oral Q7 days   Continuous Infusions:    LOS: 12 days    Time spent: 25 mins     06/12/20, MD [facility] Pager [phone]  If 7PM-7AM, please contact night-coverage 06/14/2020, 7:52 AM

## 2020-06-15 LAB — BASIC METABOLIC PANEL
Anion gap: 4 — ABNORMAL LOW (ref 5–15)
BUN: 12 mg/dL (ref 8–23)
CO2: 27 mmol/L (ref 22–32)
Calcium: 9.8 mg/dL (ref 8.9–10.3)
Chloride: 106 mmol/L (ref 98–111)
Creatinine, Ser: 0.94 mg/dL (ref 0.44–1.00)
GFR, Estimated: >60 mL/min (ref >60)
Glucose, Bld: 101 mg/dL — ABNORMAL HIGH (ref 70–99)
Potassium: 3.7 mmol/L (ref 3.5–5.1)
Sodium: 137 mmol/L (ref 135–145)

## 2020-06-15 LAB — CBC
HCT: 24.3 % — ABNORMAL LOW (ref 36.0–46.0)
Hemoglobin: 7.8 g/dL — ABNORMAL LOW (ref 12.0–15.0)
MCH: 28.8 pg (ref 26.0–34.0)
MCHC: 32.1 g/dL (ref 30.0–36.0)
MCV: 89.7 fL (ref 80.0–100.0)
Platelets: 282 10*3/uL (ref 150–400)
RBC: 2.71 MIL/uL — ABNORMAL LOW (ref 3.87–5.11)
RDW: 15 % (ref 11.5–15.5)
WBC: 5.1 10*3/uL (ref 4.0–10.5)
nRBC: 0 % (ref 0.0–0.2)

## 2020-06-15 LAB — MAGNESIUM: Magnesium: 1.8 mg/dL (ref 1.7–2.4)

## 2020-06-15 LAB — PHOSPHORUS: Phosphorus: 3.1 mg/dL (ref 2.5–4.6)

## 2020-06-15 NOTE — Progress Notes (Signed)
PROGRESS NOTE    Stephanie [name]  EXN:[id number] DOB: 03-10-1956 DOA: 06/01/2020 PCP: Pcp, No   Assessment & Plan:   Principal Problem:   Intractable vomiting with nausea Active Problems:   COVID-19   Dehydration   Primary hypertension   AKI (acute kidney injury) (HCC)   Gastroenteritis due to COVID-19 virus   Acute kidney injury (HCC)   Severe anemia     Normocytic anemia: H&H are labile. S/p 2 units of pRBCs transfused. Continue to hold plavix. Continue on PPI. No emergent EGD & can f/u outpatient as per GI. Hx of recurrent mandibular dislocation & high risk for anesthesia as per ENT. S/p colonoscopy which showed 2 polyps in the sigmoid colon that were removed & sent to pathology as well as multiple ulcers found in the rectum which were biopsied. Pathology is neg for dysplasia and malignancy   Unintentional weight loss: CT chest/abd/pelvis shows need for repeat CT in 3-6 months to assess degree of underlying scarring or fibrosis if desired, emphysema & moderate burden of stool   Generalized weakness: therapy recs SNF but pt has no insurance   Recurrent mandibular dislocation: s/p closed reduction of mandibular dislocation 06/03/20.   HTN: continue on amlodipine   Vitamin B12 deficiency: resolved  Folate deficiency: resolved  GERD: continue on PPI   Recent COVID-19 infection: dx 05/17/20. Completed isolation course  Hypokalemia: WNL today   Hypophosphatemia: within normal limits   Constipation: resolved   Vitamin D deficiency: continue on vitamin D supplements   DVT prophylaxis: SCDs Code Status: full  Family Communication:  Disposition Plan: unclear. Pt does not have any insurance.    Status is: Inpatient  Remains inpatient appropriate because: unsafe d/c plan. PT/OT recs SNF but pt does not have insurance    Dispo: The patient is from: Home              Anticipated d/c is to: SNF vs HH               Anticipated d/c date is: unclear               Patient currently is medically stable for d/c   Difficult to place patient Yes  Level of care: medsurg    Consultants:   GI   Procedures:    Antimicrobials:    Subjective: Pt c/o malaise   Objective: Vitals:   06/14/20 1515 06/14/20 2019 06/14/20 2341 06/15/20 0434  BP: 126/74 129/75 (!) 141/82 139/82  Pulse: 78 84 87 74  Resp: 17 16 18 17   Temp: 99.1 F (37.3 C) 98.2 F (36.8 C) 98 F (36.7 C) 98.5 F (36.9 C)  TempSrc: Oral   Oral  SpO2: 100% 100% 99% 94%  Weight:      Height:        Intake/Output Summary (Last 24 hours) at 06/15/2020 0728 Last data filed at 06/14/2020 1833 Gross per 24 hour  Intake 540 ml  Output -  Net 540 ml   Filed Weights   06/03/20 1126 06/04/20 1436 06/10/20 1406  Weight: 67.1 kg 74.6 kg 74 kg    Examination:  General exam: Appears comfortable  Respiratory system: clear breath sounds b/l  Cardiovascular system: S1/S2+. No rubs or gallops  Gastrointestinal system: Abd is soft, NT, ND & normal bowel sounds  Central nervous system: Alert and oriented. Moves all 4 extremities  Psychiatry: Judgement and insight appear normal. Flat mood and affect     Data Reviewed: I have personally reviewed  following labs and imaging studies  CBC: Recent Labs  Lab 06/11/20 0548 06/12/20 0650 06/13/20 0341 06/14/20 0525 06/15/20 0421  WBC 5.1 4.3 5.2 5.6 5.1  HGB 8.2* 7.6* 7.7* 8.4* 7.8*  HCT 25.0* 23.6* 23.9* 25.1* 24.3*  MCV 89.0 89.7 88.8 88.1 89.7  PLT 201 215 242 264 282   Basic Metabolic Panel: Recent Labs  Lab 06/11/20 0548 06/12/20 0650 06/13/20 0341 06/14/20 0525 06/15/20 0421  NA 141 141 141 142 137  K 4.4 4.3 4.2 4.0 3.7  CL 110 109 109 108 106  CO2 25 25 27 27 27   GLUCOSE 88 84 103* 101* 101*  BUN 5* 6* 9 11 12   CREATININE 0.81 0.97 1.12* 0.97 0.94  CALCIUM 9.3 9.5 9.6 9.9 9.8  MG 2.0 2.0 1.9 2.1 1.8  PHOS 1.9* 2.9 3.2 3.4 3.1   GFR: Estimated Creatinine Clearance: 70.7 mL/min (by C-G formula based on SCr  of 0.94 mg/dL). Liver Function Tests: No results for input(s): AST, ALT, ALKPHOS, BILITOT, PROT, ALBUMIN in the last 168 hours. No results for input(s): LIPASE, AMYLASE in the last 168 hours. No results for input(s): AMMONIA in the last 168 hours. Coagulation Profile: No results for input(s): INR, PROTIME in the last 168 hours. Cardiac Enzymes: No results for input(s): CKTOTAL, CKMB, CKMBINDEX, TROPONINI in the last 168 hours. BNP (last 3 results) No results for input(s): PROBNP in the last 8760 hours. HbA1C: No results for input(s): HGBA1C in the last 72 hours. CBG: Recent Labs  Lab 06/10/20 1406  GLUCAP 83   Lipid Profile: No results for input(s): CHOL, HDL, LDLCALC, TRIG, CHOLHDL, LDLDIRECT in the last 72 hours. Thyroid Function Tests: No results for input(s): TSH, T4TOTAL, FREET4, T3FREE, THYROIDAB in the last 72 hours. Anemia Panel: No results for input(s): VITAMINB12, FOLATE, FERRITIN, TIBC, IRON, RETICCTPCT in the last 72 hours. Sepsis Labs: No results for input(s): PROCALCITON, LATICACIDVEN in the last 168 hours.  No results found for this or any previous visit (from the past 240 hour(s)).       Radiology Studies: No results found.      Scheduled Meds: . (feeding supplement) PROSource Plus  30 mL Oral BID BM  . sodium chloride   Intravenous Once  . amLODipine  10 mg Oral Daily  . aspirin EC  81 mg Oral Daily  . atorvastatin  40 mg Oral q1800  . bisacodyl  10 mg Oral QPM  . docusate sodium  100 mg Oral BID  . Ipratropium-Albuterol  1 puff Inhalation Q6H  . mineral oil  1 enema Rectal Once  . multivitamin with minerals  1 tablet Oral Daily  . pantoprazole  40 mg Oral BID  . polyethylene glycol  17 g Oral Daily  . polyethylene glycol powder  1 Container Oral Once  . simethicone  80 mg Oral QID  . Vitamin D (Ergocalciferol)  50,000 Units Oral Q7 days   Continuous Infusions:    LOS: 13 days    Time spent: 20 mins     , MD [facility] Pager [phone]  If 7PM-7AM, please contact night-coverage 06/15/2020, 7:28 AM

## 2020-06-16 LAB — BASIC METABOLIC PANEL
Anion gap: 8 (ref 5–15)
BUN: 13 mg/dL (ref 8–23)
CO2: 24 mmol/L (ref 22–32)
Calcium: 10 mg/dL (ref 8.9–10.3)
Chloride: 107 mmol/L (ref 98–111)
Creatinine, Ser: 0.96 mg/dL (ref 0.44–1.00)
GFR, Estimated: >60 mL/min (ref >60)
Glucose, Bld: 101 mg/dL — ABNORMAL HIGH (ref 70–99)
Potassium: 3.9 mmol/L (ref 3.5–5.1)
Sodium: 139 mmol/L (ref 135–145)

## 2020-06-16 LAB — CBC
HCT: 25.2 % — ABNORMAL LOW (ref 36.0–46.0)
Hemoglobin: 8.2 g/dL — ABNORMAL LOW (ref 12.0–15.0)
MCH: 28.8 pg (ref 26.0–34.0)
MCHC: 32.5 g/dL (ref 30.0–36.0)
MCV: 88.4 fL (ref 80.0–100.0)
Platelets: 304 10*3/uL (ref 150–400)
RBC: 2.85 MIL/uL — ABNORMAL LOW (ref 3.87–5.11)
RDW: 15 % (ref 11.5–15.5)
WBC: 5 10*3/uL (ref 4.0–10.5)
nRBC: 0 % (ref 0.0–0.2)

## 2020-06-16 LAB — SURGICAL PATHOLOGY

## 2020-06-16 NOTE — Progress Notes (Signed)
PROGRESS NOTE    Stephanie [name]  JEH:[id number] DOB: 01-03-1956 DOA: 06/01/2020 PCP: Pcp, No   Assessment & Plan:   Principal Problem:   Intractable vomiting with nausea Active Problems:   COVID-19   Dehydration   Primary hypertension   AKI (acute kidney injury) (HCC)   Gastroenteritis due to COVID-19 virus   Acute kidney injury (HCC)   Severe anemia     Normocytic anemia: H&H are labile.S/p 2 units of pRBCs transfused. Continue on PPI. Continue to hold plavix. No emergent EGD & can f/u outpatient as per GI. Hx of recurrent mandibular dislocation & high risk for anesthesia as per ENT. S/p colonoscopy which showed 2 polyps in the sigmoid colon that were removed & sent to pathology as well as multiple ulcers found in the rectum which were biopsied. Pathology is neg for dysplasia and malignancy   Unintentional weight loss: etiology unclear. 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   HTN: continue on CCB   Vitamin B12 deficiency: resolved  Folate deficiency: resolved  GERD: continue on PPI   Recent COVID-19 infection: dx 05/17/20. Completed isolation course  Hypokalemia: within normal limits today   Hypophosphatemia: will monitor periodically   Constipation: resolved   Vitamin D deficiency: continue on vitamin D supplements   DVT prophylaxis: SCDs Code Status: full  Family Communication:  Disposition Plan: unclear. Pt does not have any insurance.    Status is: Inpatient  Remains inpatient appropriate because: unsafe d/c plan. PT/OT recs SNF but pt does not have insurance    Dispo: The patient is from: Home              Anticipated d/c is to: SNF vs HH               Anticipated  d/c date is: unclear              Patient currently is medically stable for d/c   Difficult to place patient Yes  Level of care: medsurg    Consultants:   GI   Procedures:    Antimicrobials:    Subjective: Pt c/o fatigue   Objective: Vitals:   06/15/20 1514 06/15/20 1933 06/16/20 0140 06/16/20 0528  BP: 132/74 133/79 (!) 146/76 128/75  Pulse: 70 93 79 61  Resp: 16 16 16 16   Temp: 98.1 F (36.7 C) 98.8 F (37.1 C) 98.5 F (36.9 C) 98.2 F (36.8 C)  TempSrc:  Oral Oral Oral  SpO2: 96% 97% 95% 91%  Weight:      Height:        Intake/Output Summary (Last 24 hours) at 06/16/2020 0726 Last data filed at 06/15/2020 1848 Gross per 24 hour  Intake 480 ml  Output -  Net 480 ml   Filed Weights   06/03/20 1126 06/04/20 1436 06/10/20 1406  Weight: 67.1 kg 74.6 kg 74 kg    Examination:  General exam: Appears calm & comfortable  Respiratory system: clear breath sounds b/l. No wheezes, rales or rhonchi  Cardiovascular system: S1 & S2+. No rubs or clicks   Gastrointestinal system: Abd is soft, NT, ND & normal bowel sounds  Central nervous system: Alert and oriented. Moves all 4 extremities  Psychiatry: Judgement and insight appear normal. Flat  mood and affect     Data Reviewed: I have personally reviewed following labs and imaging studies  CBC: Recent Labs  Lab 06/12/20 0650 06/13/20 0341 06/14/20 0525 06/15/20 0421 06/16/20 0322  WBC 4.3 5.2 5.6 5.1 5.0  HGB 7.6* 7.7* 8.4* 7.8* 8.2*  HCT 23.6* 23.9* 25.1* 24.3* 25.2*  MCV 89.7 88.8 88.1 89.7 88.4  PLT 215 242 264 282 304   Basic Metabolic Panel: Recent Labs  Lab 06/11/20 0548 06/12/20 0650 06/13/20 0341 06/14/20 0525 06/15/20 0421 06/16/20 0322  NA 141 141 141 142 137 139  K 4.4 4.3 4.2 4.0 3.7 3.9  CL 110 109 109 108 106 107  CO2 25 25 27 27 27 24   GLUCOSE 88 84 103* 101* 101* 101*  BUN 5* 6* 9 11 12 13   CREATININE 0.81 0.97 1.12* 0.97 0.94 0.96  CALCIUM 9.3 9.5 9.6 9.9 9.8 10.0  MG 2.0 2.0  1.9 2.1 1.8  --   PHOS 1.9* 2.9 3.2 3.4 3.1  --    GFR: Estimated Creatinine Clearance: 69.2 mL/min (by C-G formula based on SCr of 0.96 mg/dL). Liver Function Tests: No results for input(s): AST, ALT, ALKPHOS, BILITOT, PROT, ALBUMIN in the last 168 hours. No results for input(s): LIPASE, AMYLASE in the last 168 hours. No results for input(s): AMMONIA in the last 168 hours. Coagulation Profile: No results for input(s): INR, PROTIME in the last 168 hours. Cardiac Enzymes: No results for input(s): CKTOTAL, CKMB, CKMBINDEX, TROPONINI in the last 168 hours. BNP (last 3 results) No results for input(s): PROBNP in the last 8760 hours. HbA1C: No results for input(s): HGBA1C in the last 72 hours. CBG: Recent Labs  Lab 06/10/20 1406  GLUCAP 83   Lipid Profile: No results for input(s): CHOL, HDL, LDLCALC, TRIG, CHOLHDL, LDLDIRECT in the last 72 hours. Thyroid Function Tests: No results for input(s): TSH, T4TOTAL, FREET4, T3FREE, THYROIDAB in the last 72 hours. Anemia Panel: No results for input(s): VITAMINB12, FOLATE, FERRITIN, TIBC, IRON, RETICCTPCT in the last 72 hours. Sepsis Labs: No results for input(s): PROCALCITON, LATICACIDVEN in the last 168 hours.  No results found for this or any previous visit (from the past 240 hour(s)).       Radiology Studies: No results found.      Scheduled Meds: . (feeding supplement) PROSource Plus  30 mL Oral BID BM  . sodium chloride   Intravenous Once  . amLODipine  10 mg Oral Daily  . aspirin EC  81 mg Oral Daily  . atorvastatin  40 mg Oral q1800  . bisacodyl  10 mg Oral QPM  . docusate sodium  100 mg Oral BID  . Ipratropium-Albuterol  1 puff Inhalation Q6H  . mineral oil  1 enema Rectal Once  . multivitamin with minerals  1 tablet Oral Daily  . pantoprazole  40 mg Oral BID  . polyethylene glycol  17 g Oral Daily  . polyethylene glycol powder  1 Container Oral Once  . simethicone  80 mg Oral QID  . Vitamin D (Ergocalciferol)   50,000 Units Oral Q7 days   Continuous Infusions:    LOS: 14 days    Time spent: 22 mins     , MD [facility] Pager [phone]  If 7PM-7AM, please contact night-coverage 06/16/2020, 7:26 AM

## 2020-06-16 NOTE — Progress Notes (Signed)
Physical Therapy Treatment Patient Details Name: Stephanie [name] MRN: [id number] DOB: [birth year] Today's Date: 06/16/2020    History of Present Illness Patient is a 65 year old female who presents for nausea and vomiting. 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 Discharged from hospital on 05/29/20. Per documentation patient had a fall cleaning a toilet at work with anterior dislocation of mandiublar conydles which required closed reduction 05/23/20. Received transfusion while hospitalized.    PT Comments    Pt was pleasant and motivated to participate during the session and overall made good progress towards goals.  Pt was steady with ambulation with slow cadence and was able to ascend and descend 4 steps with bilateral rails with good control.  Pt demonstrated minor deficits in balance during static and dynamic standing balance training per below but only required occasional min A for stability during more challenging tasks such as narrow semi-tandem with reaching activities.  Pt will benefit from HHPT services upon discharge to safely address deficits listed in patient problem list for decreased caregiver assistance and eventual return to PLOF.     Follow Up Recommendations  Home health PT;Supervision - Intermittent     Equipment Recommendations  Rolling walker with 5" wheels    Recommendations for Other Services       Precautions / Restrictions Precautions Precautions: Fall Restrictions Weight Bearing Restrictions: No    Mobility  Bed Mobility Overal bed mobility: Independent Bed Mobility: Supine to Sit;Rolling Rolling: Independent   Supine to sit: Independent     General bed mobility comments: Pt demonstrated good safety awareness and stability during bed mobility  Transfers Overall transfer level: Needs assistance Equipment used: Rolling walker (2 wheeled) Transfers: Sit  to/from Stand Sit to Stand: Supervision Stand pivot transfers: Supervision       General transfer comment: Good concentric and fair eccentric control  Ambulation/Gait Ambulation/Gait assistance: Supervision Gait Distance (Feet): 125 Feet x 2 Assistive device: Rolling walker (2 wheeled) Gait Pattern/deviations: Step-through pattern;Decreased step length - right;Decreased step length - left Gait velocity: decreased   General Gait Details: Slow cadence with min cues for upright posture but steady without LOB   Stairs Stairs: Yes Stairs assistance: Min guard Stair Management: Forwards;Step to pattern;Two rails Number of Stairs: 4 General stair comments: Good control and stability with min verbal cues for step-to sequencing secondary to pt reporting chronic RLE weakness compared to LLE   Wheelchair Mobility    Modified Rankin (Stroke Patients Only)       Balance Overall balance assessment: Needs assistance Sitting-balance support: Feet supported;No upper extremity supported Sitting balance-Leahy Scale: Normal Sitting balance - Comments: Pt able to engage in feeding, dressing tasks while reaching outstide of BOS with no LOB   Standing balance support: Bilateral upper extremity supported;During functional activity;No upper extremity supported Standing balance-Leahy Scale: Good Standing balance comment: Fair to good static and dynamic standing balance during training               High Level Balance Comments: Pt reports feeling "weak" in her legs, no LOB observed            Cognition Arousal/Alertness: Awake/alert Behavior During Therapy: WFL for tasks assessed/performed;Impulsive Overall Cognitive Status: Within Functional Limits for tasks assessed Area of Impairment: Safety/judgement;Attention                       Following Commands: Follows multi-step commands inconsistently  Safety/Judgement: Decreased awareness of safety            Exercises  Other Exercises Other Exercises: Static standing balance training with feet apart, together, and semi-tandem with combinations of eyes open/closed and head still/head turns Other Exercises: Dynamic standing balance training with feet together and semi-tandem with reaching outside BOS    General Comments        Pertinent Vitals/Pain Pain Assessment: No/denies pain    Home Living                      Prior Function            PT Goals (current goals can now be found in the care plan section) Acute Rehab PT Goals Patient Stated Goal: to get stronger and get out of the hospital Progress towards PT goals: Progressing toward goals    Frequency    Min 2X/week      PT Plan Discharge plan needs to be updated    Co-evaluation              AM-PAC PT "6 Clicks" Mobility   Outcome Measure  Help needed turning from your back to your side while in a flat bed without using bedrails?: None Help needed moving from lying on your back to sitting on the side of a flat bed without using bedrails?: None Help needed moving to and from a bed to a chair (including a wheelchair)?: A Little Help needed standing up from a chair using your arms (e.g., wheelchair or bedside chair)?: A Little Help needed to walk in hospital room?: A Little Help needed climbing 3-5 steps with a railing? : A Little 6 Click Score: 20    End of Session Equipment Utilized During Treatment: Gait belt Activity Tolerance: Patient tolerated treatment well Patient left: in bed;with call bell/phone within reach Nurse Communication: Mobility status PT Visit Diagnosis: Unsteadiness on feet (R26.81);Other abnormalities of gait and mobility (R26.89);Muscle weakness (generalized) (M62.81);History of falling (Z91.81);Difficulty in walking, not elsewhere classified (R26.2)     Time: 9024-0973 PT Time Calculation (min) (ACUTE ONLY): 23 min  Charges:  $Gait Training: 8-22 mins $Therapeutic Exercise: 8-22  mins                     D. Scott Spencer Peterkin PT, DPT 06/16/20, 12:15 PM

## 2020-06-16 NOTE — Progress Notes (Signed)
Occupational Therapy Treatment Patient Details Name: KAYTELYNN SCRIPTER MRN: [id number] DOB: June 20, 1955 Today's Date: 06/16/2020    History of present illness Patient is a 65 year old female who presents for nausea and vomiting. 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 Discharged from hospital on 05/29/20. Per documentation patient had a fall cleaning a toilet at work with anterior dislocation of mandiublar conydles which required closed reduction 05/23/20. Received transfusion while hospitalized.   OT comments  Ms. Theissen has made good progress towards her goals; she was able to engage in functional mobility tasks today with Mod I - SUPV. She reports feeling "weak" in her legs, but was able to stand for > 10 minutes while engaged in ADL activities, including brief periods of standing with no UE support. Good eccentric and concentric control for sit<>stand. Pt comfortably maneuvered her RW around several obstacles in room and was able to step laterally as necessary. She reports no pain today. If pt continues to progress, and is able to ascend and descend steps (4 STE at her home, 10+ steps to reach her basement bedroom), anticipate she may well be able to go home with HHOT.   Follow Up Recommendations  Home health OT; SNF?    Equipment Recommendations  Other (comment) (RW)    Recommendations for Other Services      Precautions / Restrictions Precautions Precautions: Fall Restrictions Weight Bearing Restrictions: No       Mobility Bed Mobility Overal bed mobility: Independent Bed Mobility: Supine to Sit;Rolling Rolling: Independent   Supine to sit: Independent     General bed mobility comments: Pt demonstrated good safety awareness and stability during bed mobility  Transfers Overall transfer level: Needs assistance Equipment used: Rolling walker (2 wheeled) Transfers: Sit to/from  [employer] Sit to Stand: Supervision Stand pivot transfers: Supervision       General transfer comment: close SUPV for safety, given pt's impulsivity    Balance Overall balance assessment: Needs assistance Sitting-balance support: Feet supported;No upper extremity supported Sitting balance-Leahy Scale: Good Sitting balance - Comments: Pt able to engage in feeding, dressing tasks while reaching outstide of BOS with no LOB   Standing balance support: Bilateral upper extremity supported;During functional activity;No upper extremity supported Standing balance-Leahy Scale: Good Standing balance comment: Pt able to remove UE for RW in standing and maintain good balance               High Level Balance Comments: Pt reports feeling "weak" in her legs, no LOB observed           ADL either performed or assessed with clinical judgement   ADL Overall ADL's : Needs assistance/impaired Eating/Feeding: Independent;Bed level               Upper Body Dressing : Sitting;Modified independent       Toilet Transfer: Min guard;RW;Grab bars Toilet Transfer Details (indicate cue type and reason): Requires use of grab bar to stand after toileing; Pt reports she does not have a grab bar in her bathroom at home Toileting- Clothing Manipulation and Hygiene: Sit to/from stand;Min guard       Functional mobility during ADLs: Min guard       Vision Patient Visual Report: No change from baseline     Perception     Praxis      Cognition Arousal/Alertness: Awake/alert Behavior During Therapy: WFL for tasks assessed/performed;Impulsive Overall Cognitive Status: Within  Functional Limits for tasks assessed Area of Impairment: Safety/judgement;Attention                       Following Commands: Follows multi-step commands inconsistently Safety/Judgement: Decreased awareness of safety              Exercises Other Exercises Other Exercises: sit<>stand  x 5, good safety awareness with RW; grooming, feeding, dressing, toileting, t/fs   Shoulder Instructions       General Comments      Pertinent Vitals/ Pain       Pain Assessment: No/denies pain  Home Living                                          Prior Functioning/Environment              Frequency  Min 1X/week        Progress Toward Goals  OT Goals(current goals can now be found in the care plan section)  Progress towards OT goals: Progressing toward goals  Acute Rehab OT Goals Patient Stated Goal: to get stronger and get out of the hospital OT Goal Formulation: With patient Time For Goal Achievement: 06/20/20 Potential to Achieve Goals: Good  Plan Frequency remains appropriate;Discharge plan remains appropriate;Discharge plan needs to be updated (Pt stronger and more steady today. Possible d/c to private residence with [facility]?)    Co-evaluation                 AM-PAC OT "6 Clicks" Daily Activity     Outcome Measure   Help from another person eating meals?: None Help from another person taking care of personal grooming?: None Help from another person toileting, which includes using toliet, bedpan, or urinal?: A Little Help from another person bathing (including washing, rinsing, drying)?: A Little Help from another person to put on and taking off regular upper body clothing?: None Help from another person to put on and taking off regular lower body clothing?: A Little 6 Click Score: 21    End of Session Equipment Utilized During Treatment: Rolling walker  OT Visit Diagnosis: Unsteadiness on feet (R26.81);Repeated falls (R29.6);Muscle weakness (generalized) (M62.81)   Activity Tolerance Patient tolerated treatment well   Patient Left in chair;with call bell/phone within reach   Nurse Communication          Time: 4401-0272 OT Time Calculation (min): 24 min  Charges: OT General Charges $OT Visit: 1 Visit OT Treatments $Self  Care/Home Management : 23-37 mins  Latina Craver, PhD, MS, OTR/L ascom [phone] 06/16/20, 11:24 AM

## 2020-06-16 NOTE — TOC Progression Note (Addendum)
Transition of Care [facility]) - Progression Note    Patient Details  Name: Stephanie [name] MRN: [id number] Date of Birth: 04-13-1956  Transition of Care [facility]) CM/SW [place], RN Phone Number: 06/16/2020, 3:03 PM  Clinical Narrative:   RNCM met with patient at bedside to discuss new recommendations for HHPT. Patient sitting up in bed on arrival to room, discussed the progress she is making and that at this point she is doing to well to qualify for a facility. Patient verbalizes understanding and states that she will have to go home. When asked where she reports to her aunts house however patient says she has not told her aunt how much better she is doing. RNCM encouraged patient to reach out to her aunt and discuss things this afternoon and will likely look towards discharge tomorrow. Patient verbalizes understanding and states she will reach out to her family.   4pm: Per chart review patient received rolling walker and 3N1 in January so would not be eligible for another one    Expected Discharge Plan: [place] Barriers to Discharge: Financial Resources  Expected Discharge Plan and Services Expected Discharge Plan: [place]       Living arrangements for the past 2 months: Apartment Expected Discharge Date: 06/05/20                                     Social Determinants of Health (SDOH) Interventions    Readmission Risk Interventions No flowsheet data found.

## 2020-06-16 NOTE — TOC Progression Note (Signed)
Transition of Care [facility]) - Progression Note    Patient Details  Name: MYOSHA CUADRAS MRN: [id number] Date of Birth: 12-27-1955  Transition of Care [facility]) CM/SW [place], RN Phone Number: 06/16/2020, 3:03 PM  Clinical Narrative:   RNCM met with patient at bedside to discuss new recommendations for HHPT. Patient sitting up in bed on arrival to room, discussed the progress she is making and that at this point she is doing to well to qualify for a facility. Patient verbalizes understanding and states that she will have to go home. When asked where she reports to her aunts house however patient says she has not told her aunt how much better she is doing. RNCM encouraged patient to reach out to her aunt and discuss things this afternoon and will likely look towards discharge tomorrow. Patient verbalizes understanding and states she will reach out to her family.  3:25: Per chart notes patient received rolling walker and 3N1 at last admission.     Expected Discharge Plan: Skilled Nursing Facility Barriers to Discharge: Financial Resources  Expected Discharge Plan and Services Expected Discharge Plan: [place]       Living arrangements for the past 2 months: Apartment Expected Discharge Date: 06/05/20                                     Social Determinants of Health (SDOH) Interventions    Readmission Risk Interventions No flowsheet data found.

## 2020-06-17 DIAGNOSIS — D649 Anemia, unspecified: Secondary | ICD-10-CM

## 2020-06-17 DIAGNOSIS — U071 COVID-19: Principal | ICD-10-CM

## 2020-06-17 DIAGNOSIS — R531 Weakness: Secondary | ICD-10-CM

## 2020-06-17 DIAGNOSIS — I10 Essential (primary) hypertension: Secondary | ICD-10-CM

## 2020-06-17 LAB — CBC
HCT: 23.9 % — ABNORMAL LOW (ref 36.0–46.0)
Hemoglobin: 7.8 g/dL — ABNORMAL LOW (ref 12.0–15.0)
MCH: 28.6 pg (ref 26.0–34.0)
MCHC: 32.6 g/dL (ref 30.0–36.0)
MCV: 87.5 fL (ref 80.0–100.0)
Platelets: 314 10*3/uL (ref 150–400)
RBC: 2.73 MIL/uL — ABNORMAL LOW (ref 3.87–5.11)
RDW: 15 % (ref 11.5–15.5)
WBC: 4.7 10*3/uL (ref 4.0–10.5)
nRBC: 0 % (ref 0.0–0.2)

## 2020-06-17 LAB — BASIC METABOLIC PANEL
Anion gap: 7 (ref 5–15)
BUN: 14 mg/dL (ref 8–23)
CO2: 27 mmol/L (ref 22–32)
Calcium: 10 mg/dL (ref 8.9–10.3)
Chloride: 106 mmol/L (ref 98–111)
Creatinine, Ser: 1.01 mg/dL — ABNORMAL HIGH (ref 0.44–1.00)
GFR, Estimated: >60 mL/min (ref >60)
Glucose, Bld: 97 mg/dL (ref 70–99)
Potassium: 3.8 mmol/L (ref 3.5–5.1)
Sodium: 140 mmol/L (ref 135–145)

## 2020-06-17 MED ORDER — PANTOPRAZOLE SODIUM 40 MG PO TBEC: 40.0000 mg | DELAYED_RELEASE_TABLET | Freq: Two times a day (BID) | ORAL | 0 refills | Status: DC

## 2020-06-17 MED ORDER — CLOPIDOGREL BISULFATE 75 MG PO TABS: 75.0000 mg | ORAL_TABLET | Freq: Every day | ORAL | 2 refills | Status: DC

## 2020-06-17 NOTE — Progress Notes (Signed)
Mobility Specialist - Progress Note   06/17/20 1400  Mobility  Activity Ambulated in hall  Range of Motion/Exercises Right leg;Left leg (HR, TR, MS, SLS, TS, Lunges)  Level of Assistance Minimal assist, patient does 75% or more  Assistive Device Front wheel walker  Distance Ambulated (ft) 180 ft  Mobility Response Tolerated well  Mobility performed by Mobility specialist  $Mobility charge 1 Mobility    Pre-mobility: 83 HR, 97% SpO2 Post-mobility: 94 HR, 97% SpO2   Pt was lying in bed upon arrival utilizing room air. Pt agreed to session. Pt denied pain, nausea, and fatigue. Pt was able to exit bed independently. Pt ambulated 180' in hallway with RW. No LOB noted. Pt denied SOB. Upon return to room, pt agreeable to standing-balance exercises: heel raises, toe raises, mini squats, single leg stance, tandem stance, and lunges with minA. Pt unsteady with tandem stance, requiring minA. Overall, pt tolerated session well. Pt was left EOB with all needs in reach. Pt anticipating d/c. Pt voiced that she would like a shower chair prior to d/c. CSW notified.    Filiberto Pinks Mobility Specialist 06/17/20, 3:02 PM

## 2020-06-17 NOTE — TOC Progression Note (Addendum)
Transition of Care [facility]) - Progression Note    Patient Details  Name: Stephanie [name] MRN: [id number] Date of Birth: 06-04-1955  Transition of Care [facility]) CM/SW Contact  Trenton Founds, RN Phone Number: 06/17/2020, 9:56 AM  Clinical Narrative:   RNCM reached out to [facility] with [facility] to arrange charity home health. [name] reports that at this time they are unable to take on any charity patients due to staffing. TOC supervisor notified.  3:30pm: RNCM reached out to [place] with [facility] but he is unable to accept patient as a charity referral as well. RNCM communicated with patient and informed her that at this time home health is not able to be set up.     Expected Discharge Plan: Skilled Nursing Facility Barriers to Discharge: Financial Resources  Expected Discharge Plan and Services Expected Discharge Plan: Skilled Nursing Facility       Living arrangements for the past 2 months: Apartment Expected Discharge Date: 06/05/20                                     Social Determinants of Health (SDOH) Interventions    Readmission Risk Interventions No flowsheet data found.

## 2020-06-17 NOTE — Discharge Summary (Signed)
Physician Discharge Summary  Stephanie [name] ZOX:[id number] DOB: [birth year] DOA: 06/01/2020  PCP: Oneita HurtPcp, No  Admit date: 06/01/2020 Discharge date: 06/17/2020  Admitted From: home  Disposition:  Going home w/ pt's aunt   Recommendations for Outpatient Follow-up:  1. Follow up with PCP in 1 week 2. F/u GI, Dr. Maximino Greenlandahiliani in 3-4 weeks   Home Health: no as pt has no insurance & CM was unable to set up charity home health  Equipment/Devices:  Discharge Condition: stable  CODE STATUS: full  Diet recommendation: dysphagia III diet   Brief/Interim Summary: HPI was taken from Dr. Sedalia Mutaox: Stephanie DrownJewel C Stephanie [name] is a 65 y.o. female with medical history significant for hypertension, COVID-19 infection has completed remdesivir and Decadron treatment, tobacco abuse, hyperlipidemia, hysterectomy, presented to the emergency department for chief concerns of intractable nausea and vomiting for 1 day.  She reports that was not vomiting for 1 day.  She reports that since discharge from the hospital on 05/29/2020 she has only been eating applesauce.  She tolerated applesauce well however on 05/31/2020 she developed nausea and vomiting persistently all day.  She reports that she has not been able to tolerate any p.o. intake.  She reports that the vomitus is clear.  She denies diarrhea, constipation.  She states she does not know when the last time she had a bowel movement because she has been vomiting all day yesterday.  She is unvaccinated for COVID-19, she states that she wants to be vaccinated now.  Social history: She lives at home with her aunt who is 64 years old.  She works as a Arboriculturistcustodian at a school.  She smokes 2 packs/day, denies alcohol use, denies recreational drug use.   Hospital Course from Dr. Mayford KnifeWilliams 1/25-06/17/20: Pt was found to have anemia that was secondary to likely rectal ulcers. 2 sigmoid polyps were also found on colonoscopy and removed & sent to path. Pt did receive 2 units of pRBCs while  inpatient. Rectal ulcer biopsy & polyps were neg for malignancy and dysplasia. Pt should f/u w/ GI in 3-4 weeks. PT/OT evaluated pt and recommended SNF but pt has no insurance and CM was unable to set up charity home health. Pt was d/c home w/ pt's aunt. For more information, please see previous progress/consult notes.   Discharge Diagnoses:  Principal Problem:   Intractable vomiting with nausea Active Problems:   COVID-19   Dehydration   Primary hypertension   AKI (acute kidney injury) (HCC)   Gastroenteritis due to COVID-19 virus   Acute kidney injury (HCC)   Severe anemia Normocytic anemia: H&H are labile.S/p 2 units of pRBCs transfused. Continue on PPI. Continue to hold plavix until pt sees PCP. No emergent EGD & can f/u outpatient as per GI. Hx of recurrent mandibular dislocation & high risk for anesthesia as per ENT. S/p colonoscopy which showed 2 polyps in the sigmoid colon that were removed & sent to pathology as well as multiple ulcers found in the rectum which were biopsied. Pathology is neg for dysplasia and malignancy   Unintentional weight loss: etiology unclear. 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   HTN: continue on CCB   Vitamin B12 deficiency: resolved  Folate deficiency: resolved  GERD: continue on PPI  Recent COVID-19 infection: dx 05/17/20. Completed isolation course  Hypokalemia: within normal limits today   Hypophosphatemia: will monitor periodically   Constipation: resolved   Vitamin D deficiency: continue on vitamin D supplements    Discharge Instructions  Discharge Instructions    Diet - low sodium heart healthy   Complete by: As directed    Discharge  instructions   Complete by: As directed    F/u w/ PCP in 1 week. Hold plavix until you see your PCP. F/u w/ GI, Dr. Maximino Greenland, in 3-4 weeks   Increase activity slowly   Complete by: As directed    No wound care   Complete by: As directed      Allergies as of 06/17/2020   No Known Allergies     Medication List    TAKE these medications   amLODipine 10 MG tablet Commonly known as: NORVASC Take 1 tablet (10 mg total) by mouth daily.   aspirin 81 MG EC tablet Take 1 tablet (81 mg total) by mouth daily. Swallow whole.   atorvastatin 40 MG tablet Commonly known as: LIPITOR Take 1 tablet (40 mg total) by mouth daily.   clopidogrel 75 MG tablet Commonly known as: PLAVIX Take 1 tablet (75 mg total) by mouth daily. Hold this medication until you see your PCP. What changed: additional instructions   hydrALAZINE 25 MG tablet Commonly known as: APRESOLINE Take 1 tablet (25 mg total) by mouth 2 (two) times daily.   Ipratropium-Albuterol 20-100 MCG/ACT Aers respimat Commonly known as: COMBIVENT Inhale 1 puff into the lungs every 6 (six) hours.   lisinopril 5 MG tablet Commonly known as: ZESTRIL Take 1 tablet (5 mg total) by mouth daily.   pantoprazole 40 MG tablet Commonly known as: PROTONIX Take 1 tablet (40 mg total) by mouth 2 (two) times daily.       No Known Allergies  Consultations:  GI    Procedures/Studies: CT Code Stroke CTA Head W/WO contrast  Result Date: 05/19/2020 CLINICAL DATA:  Possible aneurysm on MRA EXAM: CT ANGIOGRAPHY HEAD AND NECK TECHNIQUE: Multidetector CT imaging of the head and neck was performed using the standard protocol during bolus administration of intravenous contrast. Multiplanar CT image reconstructions and MIPs were obtained to evaluate the vascular anatomy. Carotid stenosis measurements (when applicable) are obtained utilizing NASCET criteria, using the distal internal carotid diameter as the denominator. CONTRAST:  75mL OMNIPAQUE IOHEXOL  350 MG/ML SOLN COMPARISON:  MRI and MRA 05/18/2020 FINDINGS: CT HEAD Brain: There is no acute intracranial hemorrhage, mass effect, or edema. Gray-white differentiation is preserved. Small stroke on MRI is not visible. Patchy and confluent areas of hypoattenuation in the supratentorial white matter likely reflect chronic microvascular ischemic changes. There is no extra-axial fluid collection. Prominence of the ventricles and sulci reflects minor generalized parenchymal volume loss. Vascular: There is atherosclerotic calcification at the skull base. Skull: Calvarium is unremarkable. Sinuses/Orbits: No acute finding. Other: None. Review of the MIP images confirms the above findings CTA NECK Aortic arch: Great vessel origins are patent. Right carotid system: Patent. No measurable stenosis at the ICA origin. Left carotid system: Patent. No measurable stenosis at the ICA origin. Vertebral arteries: Patent. Left vertebral artery is slightly dominant. No measurable stenosis. Skeleton: Degenerative changes of the cervical spine. Large periapical lucency about remaining left mandibular molar. Other neck: No mass or adenopathy. Upper chest: Refer to concurrent dedicated chest imaging. Review of the MIP images confirms the above findings CTA HEAD Anterior circulation: Intracranial internal carotid arteries are  patent. There are bilateral inferiorly directed outpouching incorporating the origins of the posterior communicating arteries. Both measure approximately 3 x 3 mm. There is also a 1.4 x 1.2 x 1.4 cm structure with calcification at the periphery along the medial aspect of the cavernous right carotid. This encroaches on the sella with leftward displacement of the pituitary. A diminutive focus of enhancement extends medially from the cavernous carotid into the structure. Anterior cerebral arteries are patent. 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 Middle cerebral arteries are patent. Posterior  circulation: Intracranial vertebral arteries, basilar artery, and posterior cerebral arteries are patent. Bilateral posterior communicating arteries are present. Venous sinuses: Patent as allowed by contrast bolus timing. Review of the MIP images confirms the above findings IMPRESSION: Bilateral 3 mm outpouchings at the posterior communicating artery origins reflecting infundibula or aneurysms. 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 There is likely trace residual filling at the neck. No hemodynamically significant stenosis in the neck. No acute intracranial abnormality. Small stroke on MRI is not visible. Electronically Signed   By: Guadlupe Spanish M.D.   On: 05/19/2020 16:02   DG Chest 1 View  Result Date: 05/19/2020 CLINICAL DATA:  Recent COVID-19 positive EXAM: CHEST  1 VIEW COMPARISON:  May 17, 2020 FINDINGS: There is ill-defined opacity in the right mid lung and bibasilar regions, similar to 2 days prior. No new opacity evident. No consolidation. Heart size and pulmonary vascularity are normal. No adenopathy. Aorta is somewhat tortuous, stable. No bone lesions. IMPRESSION: Ill-defined airspace opacity right mid lung and bibasilar regions, likely due to atypical organism pneumonia. No new opacity. Stable cardiac silhouette. Electronically Signed   By: Bretta Bang III M.D.   On: 05/19/2020 08:25   CT Code Stroke CTA Neck W/WO contrast  Result Date: 05/19/2020 CLINICAL DATA:  Possible aneurysm on MRA EXAM: CT ANGIOGRAPHY HEAD AND NECK TECHNIQUE: Multidetector CT imaging of the head and neck was performed using the standard protocol during bolus administration of intravenous contrast. Multiplanar CT image reconstructions and MIPs were obtained to evaluate the vascular anatomy. Carotid stenosis measurements (when applicable) are obtained utilizing NASCET criteria, using the distal internal carotid diameter as the denominator. CONTRAST:  75mL OMNIPAQUE IOHEXOL 350 MG/ML SOLN  COMPARISON:  MRI and MRA 05/18/2020 FINDINGS: CT HEAD Brain: There is no acute intracranial hemorrhage, mass effect, or edema. Gray-white differentiation is preserved. Small stroke on MRI is not visible. Patchy and confluent areas of hypoattenuation in the supratentorial white matter likely reflect chronic microvascular ischemic changes. There is no extra-axial fluid collection. Prominence of the ventricles and sulci reflects minor generalized parenchymal volume loss. Vascular: There is atherosclerotic calcification at the skull base. Skull: Calvarium is unremarkable. Sinuses/Orbits: No acute finding. Other: None. Review of the MIP images confirms the above findings CTA NECK Aortic arch: Great vessel origins are patent. Right carotid system: Patent. No measurable stenosis at the ICA origin. Left carotid system: Patent. No measurable stenosis at the ICA origin. Vertebral arteries: Patent. Left vertebral artery is slightly dominant. No measurable stenosis. Skeleton: Degenerative changes of the cervical spine. Large periapical lucency about remaining left mandibular molar. Other neck: No mass or adenopathy. Upper chest: Refer to concurrent dedicated chest imaging. Review of the MIP images confirms the above findings CTA HEAD Anterior circulation: Intracranial internal carotid arteries are patent. There are bilateral inferiorly directed outpouching incorporating the origins of the posterior communicating arteries. Both measure approximately 3 x 3 mm. There is also a 1.4 x 1.2 x 1.4  cm structure with calcification at the periphery along the medial aspect of the cavernous right carotid. This encroaches on the sella with leftward displacement of the pituitary. A diminutive focus of enhancement extends medially from the cavernous carotid into the structure. Anterior cerebral arteries are patent. 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 Middle cerebral arteries are patent. Posterior circulation:  Intracranial vertebral arteries, basilar artery, and posterior cerebral arteries are patent. Bilateral posterior communicating arteries are present. Venous sinuses: Patent as allowed by contrast bolus timing. Review of the MIP images confirms the above findings IMPRESSION: Bilateral 3 mm outpouchings at the posterior communicating artery origins reflecting infundibula or aneurysms. 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 There is likely trace residual filling at the neck. No hemodynamically significant stenosis in the neck. No acute intracranial abnormality. Small stroke on MRI is not visible. Electronically Signed   By: Guadlupe Spanish M.D.   On: 05/19/2020 16:02   CT ANGIO CHEST PE W OR WO CONTRAST  Result Date: 05/19/2020 CLINICAL DATA:  Shortness of breath. EXAM: CT ANGIOGRAPHY CHEST WITH CONTRAST TECHNIQUE: Multidetector CT imaging of the chest was performed using the standard protocol during bolus administration of intravenous contrast. Multiplanar CT image reconstructions and MIPs were obtained to evaluate the vascular anatomy. CONTRAST:  69mL OMNIPAQUE IOHEXOL 350 MG/ML SOLN COMPARISON:  None. FINDINGS: Cardiovascular: Satisfactory opacification of the pulmonary arteries to the segmental level. No evidence of pulmonary embolism. Normal heart size. No pericardial effusion. Mediastinum/Nodes: No enlarged mediastinal, hilar, or axillary lymph nodes. Thyroid gland, trachea, and esophagus demonstrate no significant findings. Lungs/Pleura: No pneumothorax or pleural effusion is noted. Right lower lobe atelectasis or pneumonia is noted. Patchy airspace opacities are noted throughout the other lobes bilaterally suggesting subsegmental atelectasis or multifocal inflammation. Upper Abdomen: No acute abnormality. Musculoskeletal: No chest wall abnormality. No acute or significant osseous findings. Review of the MIP images confirms the above findings. IMPRESSION: 1. No definite evidence of  pulmonary embolus. 2. Right lower lobe atelectasis or pneumonia is noted. Patchy airspace opacities are noted throughout the other lobes bilaterally suggesting subsegmental atelectasis or multifocal inflammation. Electronically Signed   By: Lupita Raider M.D.   On: 05/19/2020 15:54   CT CHEST ABDOMEN PELVIS W CONTRAST  Result Date: 06/09/2020 CLINICAL DATA:  Unintended weight loss, recent COVID infection, recent stroke, constipation EXAM: CT CHEST, ABDOMEN, AND PELVIS WITH CONTRAST TECHNIQUE: Multidetector CT imaging of the chest, abdomen and pelvis was performed following the standard protocol during bolus administration of intravenous contrast. CONTRAST:  OMNIPAQUE IOHEXOL 300 MG/ML SOLN, additional oral enteric contrast COMPARISON:  CT chest angiogram 05/19/2020 FINDINGS: CT CHEST FINDINGS Cardiovascular: Scattered aortic atherosclerosis. Normal heart size. No pericardial effusion. Mediastinum/Nodes: No enlarged mediastinal, hilar, or axillary lymph nodes. Thyroid gland, trachea, and esophagus demonstrate no significant findings. Lungs/Pleura: Moderate centrilobular emphysema. Diffuse bilateral bronchial wall thickening. Interval improvement in superimposed ground-glass airspace disease, with some residual airspace disease and or scarring scattered throughout the lungs, particularly in the posterior upper lobes (series 4, image 51). No pleural effusion or pneumothorax. Musculoskeletal: No chest wall mass or suspicious bone lesions identified. CT ABDOMEN PELVIS FINDINGS Hepatobiliary: No solid liver abnormality is seen. No gallstones, gallbladder wall thickening, or biliary dilatation. Pancreas: Unremarkable. No pancreatic ductal dilatation or surrounding inflammatory changes. Spleen: Normal in size without significant abnormality. Adrenals/Urinary Tract: Adrenal glands are unremarkable. Kidneys are normal, without renal calculi, solid lesion, or hydronephrosis. Bladder is unremarkable. Stomach/Bowel:  Stomach is within normal limits. Appendix appears normal.  Moderate burden of stool throughout the colon with stool balls in the rectum. There is mild rectal wall thickening and perirectal/presacral fat stranding (series 2, image 115). Vascular/Lymphatic: No significant vascular findings are present. No enlarged abdominal or pelvic lymph nodes. Reproductive: Status post hysterectomy. Other: No abdominal wall hernia or abnormality. No abdominopelvic ascites. Musculoskeletal: No acute or significant osseous findings. IMPRESSION: 1. Interval improvement in superimposed ground-glass airspace disease, with some residual airspace disease and or scarring scattered throughout the lungs, particularly in the posterior upper lobes. Findings are consistent with resolving airspace disease. Additional CT follow-up may be performed at 3-6 months to assess for degree of chronic underlying scarring or fibrosis if desired. 2. Emphysema. 3. Moderate burden of stool throughout the colon with stool balls in the rectum. There is mild rectal wall thickening and perirectal/presacral fat stranding, suggestive of stercoral colitis, although this appearance could also be seen with prior radiation therapy to the pelvis. Correlate with clinical history. 4. Emphysema. Aortic Atherosclerosis (ICD10-I70.0) and Emphysema (ICD10-J43.9). Electronically Signed   By: Lauralyn Primes M.D.   On: 06/09/2020 16:07   DG Chest Portable 1 View  Result Date: 06/01/2020 CLINICAL DATA:  Fatigue.  Had COVID recently. EXAM: PORTABLE CHEST 1 VIEW COMPARISON:  Chest radiograph 05/19/2020 FINDINGS: The heart size and mediastinal contours are within normal limits. Slight improvement in ill-defined interstitial and airspace opacities in the right midlung and left lung base. No visible pleural effusions or pneumothorax. No acute osseous abnormality IMPRESSION: Slight improvement in ill-defined interstitial and airspace opacities in the right midlung and left lung base,  possibly representing improved pneumonia given the provided clinical history. Electronically Signed   By: Feliberto Harts MD   On: 06/01/2020 20:28   ECHOCARDIOGRAM COMPLETE  Result Date: 05/19/2020    ECHOCARDIOGRAM REPORT   Patient Name:   Stephanie [name] Date of Exam: 05/19/2020 Medical Rec #:  [id number]       Height:       72.0 in Accession #:    [id number]      Weight:       145.0 lb Date of Birth:  [birth year]       BSA:          1.858 m Patient Age:    64 years        BP:           150/84 mmHg Patient Gender: F               HR:           70 bpm. Exam Location:  [facility] Procedure: 2D Echo, Cardiac Doppler and Color Doppler Indications:     Stroke 434.91  History:         Patient has no prior history of Echocardiogram examinations.                  Risk Factors:Diabetes.  Sonographer:     [name] RDCS (AE) Referring Phys:  [id number] Gordy Councilman Diagnosing Phys: Lorine Bears MD  Sonographer Comments: No subcostal window. IMPRESSIONS  1. Left ventricular ejection fraction, by estimation, is 55 to 60%. The left ventricle has normal function. The left ventricle has no regional wall motion abnormalities. There is mild left ventricular hypertrophy. Left ventricular diastolic parameters are consistent with Grade I diastolic dysfunction (impaired relaxation).  2. Right ventricular systolic function is normal. The right ventricular size is normal. Tricuspid regurgitation signal is inadequate for assessing PA pressure.  3. Left atrial size was  mildly dilated.  4. The mitral valve is normal in structure. No evidence of mitral valve regurgitation. No evidence of mitral stenosis.  5. The aortic valve is normal in structure. Aortic valve regurgitation is not visualized. No aortic stenosis is present. FINDINGS  Left Ventricle: Left ventricular ejection fraction, by estimation, is 55 to 60%. The left ventricle has normal function. The left ventricle has no regional wall motion abnormalities. The left ventricular  internal cavity size was normal in size. There is  mild left ventricular hypertrophy. Left ventricular diastolic parameters are consistent with Grade I diastolic dysfunction (impaired relaxation). Right Ventricle: The right ventricular size is normal. No increase in right ventricular wall thickness. Right ventricular systolic function is normal. Tricuspid regurgitation signal is inadequate for assessing PA pressure. Left Atrium: Left atrial size was mildly dilated. Right Atrium: Right atrial size was normal in size. Pericardium: There is no evidence of pericardial effusion. Mitral Valve: The mitral valve is normal in structure. No evidence of mitral valve regurgitation. No evidence of mitral valve stenosis. Tricuspid Valve: The tricuspid valve is normal in structure. Tricuspid valve regurgitation is not demonstrated. No evidence of tricuspid stenosis. Aortic Valve: The aortic valve is normal in structure. Aortic valve regurgitation is not visualized. No aortic stenosis is present. Aortic valve mean gradient measures 6.5 mmHg. Aortic valve peak gradient measures 10.0 mmHg. Aortic valve area, by VTI measures 2.50 cm. Pulmonic Valve: The pulmonic valve was normal in structure. Pulmonic valve regurgitation is not visualized. No evidence of pulmonic stenosis. Aorta: The aortic root is normal in size and structure. Venous: The inferior vena cava was not well visualized. IAS/Shunts: No atrial level shunt detected by color flow Doppler.  LEFT VENTRICLE PLAX 2D LVIDd:         4.71 cm  Diastology LVIDs:         3.35 cm  LV e' medial:    5.11 cm/s LV PW:         1.30 cm  LV E/e' medial:  14.2 LV IVS:        0.93 cm  LV e' lateral:   4.35 cm/s LVOT diam:     2.00 cm  LV E/e' lateral: 16.7 LV SV:         78 LV SV Index:   42 LVOT Area:     3.14 cm  RIGHT VENTRICLE RV Basal diam:  4.15 cm RV S prime:     16.30 cm/s TAPSE (M-mode): 4.3 cm LEFT ATRIUM           Index       RIGHT ATRIUM           Index LA diam:      4.60 cm 2.48  cm/m  RA Area:     17.90 cm LA Vol (A2C): 58.5 ml 31.48 ml/m RA Volume:   46.30 ml  24.91 ml/m LA Vol (A4C): 66.7 ml 35.89 ml/m  AORTIC VALVE                    PULMONIC VALVE AV Area (Vmax):    2.40 cm     PV Vmax:        0.80 m/s AV Area (Vmean):   2.11 cm     PV Peak grad:   2.5 mmHg AV Area (VTI):     2.50 cm     RVOT Peak grad: 3 mmHg AV Vmax:           158.50 cm/s AV Vmean:  119.000 cm/s AV VTI:            0.313 m AV Peak Grad:      10.0 mmHg AV Mean Grad:      6.5 mmHg LVOT Vmax:         121.00 cm/s LVOT Vmean:        79.800 cm/s LVOT VTI:          0.249 m LVOT/AV VTI ratio: 0.80  AORTA Ao Root diam: 3.37 cm MITRAL VALVE                TRICUSPID VALVE MV Area (PHT): 2.50 cm     TR Peak grad:   20.8 mmHg MV Decel Time: 303 msec     TR Vmax:        228.00 cm/s MV E velocity: 72.80 cm/s MV A velocity: 101.00 cm/s  SHUNTS MV E/A ratio:  0.72         Systemic VTI:  0.25 m                             Systemic Diam: 2.00 cm Lorine Bears MD Electronically signed by Lorine Bears MD Signature Date/Time: 05/19/2020/5:08:10 PM    Final    CT MAXILLOFACIAL WO CONTRAST  Result Date: 06/03/2020 CLINICAL DATA:  Jaw dislocation EXAM: CT MAXILLOFACIAL WITHOUT CONTRAST TECHNIQUE: Multidetector CT imaging of the maxillofacial structures was performed. Multiplanar CT image reconstructions were also generated. COMPARISON:  05/23/2020 and prior. FINDINGS: Osseous: Anterior displacement of the bilateral mandibular condyles is unchanged. No fracture. Multifocal carious disease. Orbits: Negative. No traumatic or inflammatory finding. Sinuses: Posterior right ethmoid frothy secretions. Otherwise clear paranasal sinuses. Soft tissues: Negative. Limited intracranial: No acute finding. 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 IMPRESSION: Anterior displacement of bilateral mandibular condyles, unchanged. No fracture. Multifocal carious disease. Peripherally calcified right cavernous ICA  aneurysm is unchanged in size. Electronically Signed   By: Stana Bunting M.D.   On: 06/03/2020 14:16   CT MAXILLOFACIAL WO CONTRAST  Result Date: 05/23/2020 CLINICAL DATA:  TMJ dislocations. EXAM: CT MAXILLOFACIAL WITHOUT CONTRAST TECHNIQUE: Multidetector CT imaging of the maxillofacial structures was performed. Multiplanar CT image reconstructions were also generated. COMPARISON:  None. FINDINGS: The mandibular condyles are dislocated anteriorly. They are anterior to the condylar eminence is. No fracture is identified. Fairly extensive dental disease is noted with dental caries and areas of periapical lucency. The facial bones are intact. No facial bone fractures are identified. The bony orbits are intact. The globes are normal. Normal intraorbital fat. The paranasal sinuses and mastoid air cells are clear. No significant intracranial abnormalities are identified. Vascular calcifications are noted. 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 No significant subcutaneous soft tissue abnormalities. IMPRESSION: 1. Anterior dislocation of the mandibular condyles. 2. No facial bone fractures are identified. 3. Fairly extensive dental disease. 4. 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 Electronically Signed   By: Rudie Meyer M.D.   On: 05/23/2020 12:28       Subjective: Pt c/o fatigue   Discharge Exam: Vitals:   06/17/20 0810 06/17/20 1545  BP: 132/72 124/76  Pulse: 73 80  Resp: 16 18  Temp: 98.4 F (36.9 C) 98 F (36.7 C)  SpO2: 97% 100%   Vitals:   06/16/20 2342 06/17/20 0428 06/17/20 0810 06/17/20 1545  BP: 131/77 130/73 132/72 124/76  Pulse: 78 68 73 80  Resp: 16 16 16  18  Temp: 98.5 F (36.9 C) 98.9 F (37.2 C) 98.4 F (36.9 C) 98 F (36.7 C)  TempSrc: Oral Oral Oral Oral  SpO2: 100% 100% 97% 100%  Weight:      Height:        General: Pt is alert, awake, not in acute distress Cardiovascular:  S1/S2 +, no rubs, no  gallops Respiratory: CTA bilaterally, no wheezing, no rhonchi Abdominal: Soft, NT, ND, bowel sounds + Extremities: no edema, no cyanosis    The results of significant diagnostics from this hospitalization (including imaging, microbiology, ancillary and laboratory) are listed below for reference.     Microbiology: No results found for this or any previous visit (from the past 240 hour(s)).   Labs: BNP (last 3 results) Recent Labs    05/17/20 2206 05/19/20 0515  BNP 31.0 428.0*   Basic Metabolic Panel: Recent Labs  Lab 06/11/20 0548 06/12/20 0650 06/13/20 0341 06/14/20 0525 06/15/20 0421 06/16/20 0322 06/17/20 0631  NA 141 141 141 142 137 139 140  K 4.4 4.3 4.2 4.0 3.7 3.9 3.8  CL 110 109 109 108 106 107 106  CO2 25 25 27 27 27 24 27   GLUCOSE 88 84 103* 101* 101* 101* 97  BUN 5* 6* 9 11 12 13 14   CREATININE 0.81 0.97 1.12* 0.97 0.94 0.96 1.01*  CALCIUM 9.3 9.5 9.6 9.9 9.8 10.0 10.0  MG 2.0 2.0 1.9 2.1 1.8  --   --   PHOS 1.9* 2.9 3.2 3.4 3.1  --   --    Liver Function Tests: No results for input(s): AST, ALT, ALKPHOS, BILITOT, PROT, ALBUMIN in the last 168 hours. No results for input(s): LIPASE, AMYLASE in the last 168 hours. No results for input(s): AMMONIA in the last 168 hours. CBC: Recent Labs  Lab 06/13/20 0341 06/14/20 0525 06/15/20 0421 06/16/20 0322 06/17/20 0631  WBC 5.2 5.6 5.1 5.0 4.7  HGB 7.7* 8.4* 7.8* 8.2* 7.8*  HCT 23.9* 25.1* 24.3* 25.2* 23.9*  MCV 88.8 88.1 89.7 88.4 87.5  PLT 242 264 282 304 314   Cardiac Enzymes: No results for input(s): CKTOTAL, CKMB, CKMBINDEX, TROPONINI in the last 168 hours. BNP: Invalid input(s): POCBNP CBG: No results for input(s): GLUCAP in the last 168 hours. D-Dimer No results for input(s): DDIMER in the last 72 hours. Hgb A1c No results for input(s): HGBA1C in the last 72 hours. Lipid Profile No results for input(s): CHOL, HDL, LDLCALC, TRIG, CHOLHDL, LDLDIRECT in the last 72 hours. Thyroid function  studies No results for input(s): TSH, T4TOTAL, T3FREE, THYROIDAB in the last 72 hours.  Invalid input(s): FREET3 Anemia work up No results for input(s): VITAMINB12, FOLATE, FERRITIN, TIBC, IRON, RETICCTPCT in the last 72 hours. Urinalysis    Component Value Date/Time   COLORURINE YELLOW (A) 05/18/2020 0632   APPEARANCEUR HAZY (A) 05/18/2020 0632   LABSPEC 1.015 05/18/2020 0632   PHURINE 5.0 05/18/2020 0632   GLUCOSEU NEGATIVE 05/18/2020 0632   HGBUR NEGATIVE 05/18/2020 0632   BILIRUBINUR NEGATIVE 05/18/2020 0632   KETONESUR NEGATIVE 05/18/2020 0632   PROTEINUR 30 (A) 05/18/2020 0632   NITRITE NEGATIVE 05/18/2020 0632   LEUKOCYTESUR NEGATIVE 05/18/2020 7673   Sepsis Labs Invalid input(s): PROCALCITONIN,  WBC,  LACTICIDVEN Microbiology No results found for this or any previous visit (from the past 240 hour(s)).   Time coordinating discharge: Over 30 minutes  SIGNED:   Charise Killian, MD  [facility] 06/17/2020, 3:46 PM Pager   If 7PM-7AM, please contact night-coverage

## 2020-06-19 ENCOUNTER — Encounter: Payer: Self-pay | Admitting: Gastroenterology

## 2020-06-30 ENCOUNTER — Telehealth: Payer: Self-pay | Admitting: Gastroenterology

## 2020-06-30 NOTE — Telephone Encounter (Signed)
Patient called LM on VM to return her call.  She left her number as [phone].  Unable to contact

## 2020-07-08 ENCOUNTER — Telehealth: Payer: Self-pay

## 2020-07-08 NOTE — Telephone Encounter (Signed)
-----   Message from Pasty Spillers, MD sent at 06/24/2020 12:46 PM EST ----- Stephanie [name] please let the patient know, pathology report from her recent colonoscopy showed precancerous polyps that were completely removed.  However, she will likely need another colonoscopy within the next 3 months.  We need to see her in GI clinic to further discuss this.  Dr. Allegra Lai has also been trying to contact the patient to set up clinic follow-up with her.  Please try her alternate contact person if you are unable to reach her for results.

## 2020-07-08 NOTE — Telephone Encounter (Signed)
Called and left a message for call back. Tried to call emergency contact and left a message for call back

## 2020-07-25 ENCOUNTER — Other Ambulatory Visit: Payer: Self-pay | Admitting: Family Medicine

## 2020-08-06 ENCOUNTER — Encounter (INDEPENDENT_AMBULATORY_CARE_PROVIDER_SITE_OTHER): Payer: Self-pay

## 2020-08-06 ENCOUNTER — Ambulatory Visit: Payer: Medicaid Other | Admitting: Pharmacy Technician

## 2020-08-06 ENCOUNTER — Other Ambulatory Visit: Payer: Self-pay

## 2020-08-06 DIAGNOSIS — Z79899 Other long term (current) drug therapy: Secondary | ICD-10-CM

## 2020-08-06 NOTE — Progress Notes (Signed)
Completed [facility] application and contract.  Patient agreed to all terms of the [facility] contract.    Patient approved to receive medication assistance at [facility] until time for re-certification in 0223 and as long as eligibility criteria continues to be met.    Provided patient with community resource material based on her particular needs.   Patient stated that she is seeing a provider at [facility] who took her off the Combivent Respimat.  Patient stated that she does not need [facility] to order this medication. I made Pharmacy Staff aware.  [place] [facility]

## 2020-08-18 ENCOUNTER — Other Ambulatory Visit: Payer: Self-pay

## 2020-08-25 ENCOUNTER — Other Ambulatory Visit: Payer: Self-pay

## 2020-08-25 MED FILL — Lisinopril Tab 5 MG: ORAL | 30 days supply | Qty: 30 | Fill #0 | Status: AC

## 2020-08-25 MED FILL — Hydralazine HCl Tab 50 MG: ORAL | 90 days supply | Qty: 270 | Fill #0 | Status: AC

## 2020-08-25 MED FILL — Polyethylene Glycol 3350 Oral Packet 17 GM: ORAL | 30 days supply | Qty: 30 | Fill #0 | Status: AC

## 2020-08-25 MED FILL — Pantoprazole Sodium EC Tab 40 MG (Base Equiv): ORAL | 90 days supply | Qty: 180 | Fill #0 | Status: AC

## 2020-08-25 MED FILL — Atorvastatin Calcium Tab 40 MG (Base Equivalent): ORAL | 90 days supply | Qty: 90 | Fill #0 | Status: AC

## 2020-08-25 MED FILL — Clopidogrel Bisulfate Tab 75 MG (Base Equiv): ORAL | 90 days supply | Qty: 90 | Fill #0 | Status: AC

## 2020-08-25 MED FILL — Amlodipine Besylate Tab 10 MG (Base Equivalent): ORAL | 90 days supply | Qty: 90 | Fill #0 | Status: AC

## 2020-08-25 MED FILL — Amlodipine Besylate Tab 10 MG (Base Equivalent): ORAL | 30 days supply | Qty: 30 | Fill #0 | Status: CN

## 2020-10-03 ENCOUNTER — Other Ambulatory Visit: Payer: Self-pay

## 2020-10-07 ENCOUNTER — Telehealth: Payer: Self-pay | Admitting: Pharmacy Technician

## 2020-10-07 NOTE — Telephone Encounter (Signed)
Received updated proof of income.  Patient eligible to receive medication assistance at [facility] until time for re-certification in 1610, and as long as eligibility requirements continue to be met.  [place] [facility]

## 2020-11-24 ENCOUNTER — Other Ambulatory Visit: Payer: Self-pay

## 2020-11-24 MED FILL — Pantoprazole Sodium EC Tab 40 MG (Base Equiv): ORAL | 90 days supply | Qty: 180 | Fill #1 | Status: AC

## 2020-11-24 MED FILL — Hydralazine HCl Tab 50 MG: ORAL | 90 days supply | Qty: 270 | Fill #1 | Status: AC

## 2020-11-24 MED FILL — Atorvastatin Calcium Tab 40 MG (Base Equivalent): ORAL | 90 days supply | Qty: 90 | Fill #1 | Status: AC

## 2020-11-24 MED FILL — Amlodipine Besylate Tab 10 MG (Base Equivalent): ORAL | 90 days supply | Qty: 90 | Fill #1 | Status: AC

## 2020-11-24 MED FILL — Clopidogrel Bisulfate Tab 75 MG (Base Equiv): ORAL | 90 days supply | Qty: 90 | Fill #1 | Status: AC

## 2020-11-24 MED FILL — Aspirin Tab Delayed Release 81 MG: ORAL | 90 days supply | Qty: 90 | Fill #0 | Status: AC

## 2020-11-24 MED FILL — Polyethylene Glycol 3350 Oral Packet 17 GM: ORAL | 30 days supply | Qty: 30 | Fill #1 | Status: AC

## 2020-11-25 ENCOUNTER — Other Ambulatory Visit: Payer: Self-pay

## 2021-02-23 MED FILL — Hydralazine HCl Tab 50 MG: ORAL | 90 days supply | Qty: 270 | Fill #2 | Status: AC

## 2021-02-23 MED FILL — Aspirin Tab Delayed Release 81 MG: ORAL | 90 days supply | Qty: 90 | Fill #1 | Status: AC

## 2021-02-23 MED FILL — Amlodipine Besylate Tab 10 MG (Base Equivalent): ORAL | 90 days supply | Qty: 90 | Fill #2 | Status: AC

## 2021-02-23 MED FILL — Atorvastatin Calcium Tab 40 MG (Base Equivalent): ORAL | 90 days supply | Qty: 90 | Fill #2 | Status: AC

## 2021-02-23 MED FILL — Clopidogrel Bisulfate Tab 75 MG (Base Equiv): ORAL | 90 days supply | Qty: 90 | Fill #2 | Status: AC

## 2021-02-23 MED FILL — Pantoprazole Sodium EC Tab 40 MG (Base Equiv): ORAL | 90 days supply | Qty: 180 | Fill #2 | Status: AC

## 2021-02-24 ENCOUNTER — Other Ambulatory Visit: Payer: Self-pay

## 2021-02-24 ENCOUNTER — Ambulatory Visit: Payer: Medicaid Other

## 2021-02-24 DIAGNOSIS — Z79899 Other long term (current) drug therapy: Secondary | ICD-10-CM

## 2021-02-24 NOTE — Progress Notes (Signed)
[facility] Visit Note  Patient: Stephanie [name] MRN: [id number] Date of Birth: 09-18-1955 PCP: Pcp, No   Stephanie [name] 65 y.o. female presents for a MTM visit today. Identified by name and date of birth.  There were no vitals taken for this visit.  Patient Information   Past Medical History:  Diagnosis Date   Diabetes mellitus without complication (HCC)    Stroke [facility])       Past Surgical History:  Procedure Laterality Date   CLOSED REDUCTION MANDIBLE Bilateral 05/23/2020   Procedure: CLOSED REDUCTION MANDIBULAR;  Surgeon: Vernie Murders, MD;  Location: [facility] ORS;  Service: ENT;  Laterality: Bilateral;   CLOSED REDUCTION MANDIBLE N/A 06/03/2020   Procedure: CLOSED REDUCTION MANDIBULAR;  Surgeon: Linus Salmons, MD;  Location: [facility] ORS;  Service: ENT;  Laterality: N/A;   COLONOSCOPY WITH PROPOFOL N/A 06/10/2020   Procedure: COLONOSCOPY WITH PROPOFOL;  Surgeon: Pasty Spillers, MD;  Location: [facility] ENDOSCOPY;  Service: Endoscopy;  Laterality: N/A;    No family history on file.  New Diagnoses (since last visit):   Family Support: Good  Lifestyle Diet: Breakfast: cereal, sausage Lunch: sandwich Dinner: vegetable with a choice of meat  Exercise: walks daily at the park for 1 hour and 45 minutes            Social History   Substance and Sexual Activity  Alcohol Use None      Social History   Tobacco Use  Smoking Status Former   Types: Cigarettes   Quit date: 04/2020   Years since quitting: 0.8  Smokeless Tobacco Never      Health Maintenance  Topic Date Due   COVID-19 Vaccine (1) Never done   Hepatitis C Screening  Never done   TETANUS/TDAP  Never done   Zoster Vaccines- Shingrix (1 of 2) Never done   PAP SMEAR-Modifier  Never done   MAMMOGRAM  Never done   INFLUENZA VACCINE  Never done   COLONOSCOPY (Pts 45-4yrs Insurance coverage will need to be confirmed)  06/10/2030   HIV Screening  Completed   HPV VACCINES  Aged Out    Outpatient Encounter Medications as of 02/24/2021  Medication Sig   amLODipine (NORVASC) 10 MG tablet TAKE ONE TABLET BY MOUTH EVERY DAY FOR HIGH BLOOD PRESSURE   aspirin 81 MG EC tablet TAKE ONE TABLET BY MOUTH ONCE A DAY FOR HEART PROTECTION.   atorvastatin (LIPITOR) 40 MG tablet TAKE ONE TABLET BY MOUTH EVERY NIGHT FOR HIGH CHOLESTEROL   clopidogrel (PLAVIX) 75 MG tablet TAKE ONE TABLET BY MOUTH EVERY DAY   hydrALAZINE (APRESOLINE) 50 MG tablet TAKE ONE TABLET BY MOUTH 3 TIMES A DAY   pantoprazole (PROTONIX) 40 MG tablet TAKE ONE TABLET BY MOUTH 2 TIMES A DAY   clopidogrel (PLAVIX) 75 MG tablet Take 1 tablet (75 mg total) by mouth daily. Hold this medication until you see your PCP. (Patient not taking: Reported on 02/24/2021)   hydrALAZINE (APRESOLINE) 25 MG tablet Take 1 tablet (25 mg total) by mouth 2 (two) times daily. (Patient not taking: Reported on 02/24/2021)   Ipratropium-Albuterol (COMBIVENT) 20-100 MCG/ACT AERS respimat Inhale 1 puff into the lungs every 6 (six) hours. (Patient not taking: Reported on 02/24/2021)   lisinopril (ZESTRIL) 5 MG tablet TAKE ONE TABLET BY MOUTH EVERY DAY (Patient not taking: Reported on 02/24/2021)   pantoprazole (PROTONIX) 40 MG tablet Take 1 tablet (40 mg total) by mouth 2 (two) times daily.   No facility-administered encounter medications on file  as of 02/24/2021.      Health Maintenance/Date Completed  Last ED visit: 06/01/2020 (COVID and stroke) Last Visit to PCP: May 2022 Next Visit to PCP: plans to call to make appointment Specialist Visit: No Dental Exam: Years  Eye Exam: No Pelvic/PAP Exam: Not recently Mammogram: No  Colonoscopy: 05/2020 Flu Vaccine: No Pneumonia Vaccine: No COVID-19 Vaccine: Yes. 2 doses. Plans to receive booster. Shingrix Vaccine: No   Assessment and Plan:  Adherent to medications. Patient is aware of indications. Plans to call primary care regarding management of stroke medications.  Hypertension: Managed  on amlodipine 10 mg and hydralazine 50 mg three times daily. Blood pressure uncontrolled 138/82 at this visit. Reports this to be consistent with other office and home values. Checks blood pressure once a month. Educated on DASH diet. Patient reports allergy to lisinopril with angioedema in April of 2022. Added allergy to patient's chart.  Tobacco cessation: Currently smoking 0.5 packs per day. Expresses desire to quit. Educated on smoking and cardiovascular risks given past stroke. Provided 1800-QUIT-NOW phone line as a resource. Health maintenance: has not seen dentist in many years. Provided info sheet for [facility], a free service dental clinic.   RTC: 1 year   Jaynie Bream, PharmD Pharmacy Resident  02/24/2021 11:26 AM

## 2021-02-25 ENCOUNTER — Other Ambulatory Visit: Payer: Self-pay

## 2021-05-26 ENCOUNTER — Other Ambulatory Visit: Payer: Self-pay

## 2021-05-26 MED FILL — Atorvastatin Calcium Tab 40 MG (Base Equivalent): ORAL | 60 days supply | Qty: 60 | Fill #3 | Status: AC

## 2021-05-26 MED FILL — Hydralazine HCl Tab 50 MG: ORAL | 60 days supply | Qty: 180 | Fill #3 | Status: AC

## 2021-05-26 MED FILL — Amlodipine Besylate Tab 10 MG (Base Equivalent): ORAL | 60 days supply | Qty: 60 | Fill #3 | Status: AC

## 2021-05-26 MED FILL — Aspirin Tab Delayed Release 81 MG: ORAL | 90 days supply | Qty: 90 | Fill #2 | Status: AC

## 2021-05-26 MED FILL — Clopidogrel Bisulfate Tab 75 MG (Base Equiv): ORAL | 60 days supply | Qty: 60 | Fill #3 | Status: AC

## 2021-05-26 MED FILL — Pantoprazole Sodium EC Tab 40 MG (Base Equiv): ORAL | 30 days supply | Qty: 60 | Fill #3 | Status: AC

## 2021-07-01 ENCOUNTER — Other Ambulatory Visit: Payer: Self-pay

## 2021-07-10 ENCOUNTER — Encounter (HOSPITAL_COMMUNITY): Payer: Self-pay | Admitting: Radiology

## 2021-07-15 ENCOUNTER — Other Ambulatory Visit: Payer: Self-pay

## 2021-07-15 MED FILL — Pantoprazole Sodium EC Tab 40 MG (Base Equiv): ORAL | 30 days supply | Qty: 60 | Fill #4 | Status: AC

## 2021-07-30 ENCOUNTER — Other Ambulatory Visit: Payer: Self-pay

## 2021-07-31 ENCOUNTER — Other Ambulatory Visit: Payer: Self-pay

## 2021-08-07 ENCOUNTER — Other Ambulatory Visit: Payer: Self-pay

## 2021-08-19 ENCOUNTER — Other Ambulatory Visit: Payer: Self-pay

## 2021-08-19 MED ORDER — PANTOPRAZOLE SODIUM 40 MG PO TBEC
DELAYED_RELEASE_TABLET | Freq: Two times a day (BID) | ORAL | 0 refills | Status: AC
  Filled 2021-08-19: qty 180, 90d supply, fill #0

## 2021-08-19 MED ORDER — HYDRALAZINE HCL 50 MG PO TABS
ORAL_TABLET | Freq: Three times a day (TID) | ORAL | 2 refills | Status: AC
  Filled 2021-08-19: qty 270, 90d supply, fill #0

## 2021-08-19 MED ORDER — ATORVASTATIN CALCIUM 40 MG PO TABS
ORAL_TABLET | ORAL | 2 refills | Status: DC
  Filled 2021-08-19: qty 90, 90d supply, fill #0

## 2021-08-19 MED ORDER — ASPIRIN 81 MG PO TBEC
DELAYED_RELEASE_TABLET | ORAL | 2 refills | Status: AC
  Filled 2021-08-19: qty 90, 90d supply, fill #0

## 2021-08-19 MED ORDER — CLOPIDOGREL BISULFATE 75 MG PO TABS
75.0000 mg | ORAL_TABLET | Freq: Every day | ORAL | 3 refills | Status: DC
  Filled 2021-08-19: qty 90, 90d supply, fill #0

## 2021-08-19 MED ORDER — AMLODIPINE BESYLATE 10 MG PO TABS
ORAL_TABLET | Freq: Every day | ORAL | 2 refills | Status: AC
  Filled 2021-08-19: qty 90, 90d supply, fill #0

## 2021-09-16 ENCOUNTER — Other Ambulatory Visit: Payer: Self-pay

## 2021-09-17 ENCOUNTER — Other Ambulatory Visit: Payer: Self-pay

## 2021-09-23 ENCOUNTER — Other Ambulatory Visit: Payer: Self-pay

## 2021-10-09 ENCOUNTER — Other Ambulatory Visit: Payer: Self-pay

## 2022-12-14 ENCOUNTER — Emergency Department: Payer: 59

## 2022-12-14 ENCOUNTER — Other Ambulatory Visit: Payer: Self-pay

## 2022-12-14 ENCOUNTER — Inpatient Hospital Stay
Admission: EM | Admit: 2022-12-14 | Discharge: 2022-12-21 | DRG: 598 | Disposition: A | Discharge status: Skilled Nursing Facility | Payer: 59 | Attending: Student | Admitting: Student

## 2022-12-14 ENCOUNTER — Observation Stay: Payer: 59

## 2022-12-14 DIAGNOSIS — C787 Secondary malignant neoplasm of liver and intrahepatic bile duct: Secondary | ICD-10-CM | POA: Diagnosis present

## 2022-12-14 DIAGNOSIS — Z8673 Personal history of transient ischemic attack (TIA), and cerebral infarction without residual deficits: Secondary | ICD-10-CM

## 2022-12-14 DIAGNOSIS — Z9181 History of falling: Secondary | ICD-10-CM

## 2022-12-14 DIAGNOSIS — Z888 Allergy status to other drugs, medicaments and biological substances status: Secondary | ICD-10-CM

## 2022-12-14 DIAGNOSIS — Z7982 Long term (current) use of aspirin: Secondary | ICD-10-CM

## 2022-12-14 DIAGNOSIS — J441 Chronic obstructive pulmonary disease with (acute) exacerbation: Secondary | ICD-10-CM

## 2022-12-14 DIAGNOSIS — Z823 Family history of stroke: Secondary | ICD-10-CM

## 2022-12-14 DIAGNOSIS — R296 Repeated falls: Secondary | ICD-10-CM | POA: Diagnosis present

## 2022-12-14 DIAGNOSIS — R160 Hepatomegaly, not elsewhere classified: Secondary | ICD-10-CM

## 2022-12-14 DIAGNOSIS — Z66 Do not resuscitate: Secondary | ICD-10-CM | POA: Diagnosis present

## 2022-12-14 DIAGNOSIS — Z515 Encounter for palliative care: Secondary | ICD-10-CM

## 2022-12-14 DIAGNOSIS — M5126 Other intervertebral disc displacement, lumbar region: Secondary | ICD-10-CM | POA: Diagnosis present

## 2022-12-14 DIAGNOSIS — R7989 Other specified abnormal findings of blood chemistry: Secondary | ICD-10-CM

## 2022-12-14 DIAGNOSIS — F17210 Nicotine dependence, cigarettes, uncomplicated: Secondary | ICD-10-CM | POA: Diagnosis present

## 2022-12-14 DIAGNOSIS — N179 Acute kidney failure, unspecified: Secondary | ICD-10-CM | POA: Diagnosis present

## 2022-12-14 DIAGNOSIS — R29898 Other symptoms and signs involving the musculoskeletal system: Secondary | ICD-10-CM | POA: Diagnosis not present

## 2022-12-14 DIAGNOSIS — Z8719 Personal history of other diseases of the digestive system: Secondary | ICD-10-CM

## 2022-12-14 DIAGNOSIS — Z90710 Acquired absence of both cervix and uterus: Secondary | ICD-10-CM

## 2022-12-14 DIAGNOSIS — R531 Weakness: Secondary | ICD-10-CM | POA: Diagnosis not present

## 2022-12-14 DIAGNOSIS — M47816 Spondylosis without myelopathy or radiculopathy, lumbar region: Secondary | ICD-10-CM | POA: Diagnosis present

## 2022-12-14 DIAGNOSIS — E7800 Pure hypercholesterolemia, unspecified: Secondary | ICD-10-CM | POA: Diagnosis present

## 2022-12-14 DIAGNOSIS — Z8249 Family history of ischemic heart disease and other diseases of the circulatory system: Secondary | ICD-10-CM

## 2022-12-14 DIAGNOSIS — I10 Essential (primary) hypertension: Secondary | ICD-10-CM | POA: Diagnosis present

## 2022-12-14 DIAGNOSIS — R7303 Prediabetes: Secondary | ICD-10-CM | POA: Insufficient documentation

## 2022-12-14 DIAGNOSIS — Z79899 Other long term (current) drug therapy: Secondary | ICD-10-CM

## 2022-12-14 DIAGNOSIS — K7469 Other cirrhosis of liver: Secondary | ICD-10-CM | POA: Diagnosis present

## 2022-12-14 DIAGNOSIS — C7951 Secondary malignant neoplasm of bone: Secondary | ICD-10-CM | POA: Diagnosis present

## 2022-12-14 DIAGNOSIS — Z7902 Long term (current) use of antithrombotics/antiplatelets: Secondary | ICD-10-CM

## 2022-12-14 DIAGNOSIS — Z1152 Encounter for screening for COVID-19: Secondary | ICD-10-CM

## 2022-12-14 DIAGNOSIS — Z825 Family history of asthma and other chronic lower respiratory diseases: Secondary | ICD-10-CM

## 2022-12-14 DIAGNOSIS — I639 Cerebral infarction, unspecified: Secondary | ICD-10-CM | POA: Diagnosis present

## 2022-12-14 DIAGNOSIS — C50812 Malignant neoplasm of overlapping sites of left female breast: Principal | ICD-10-CM | POA: Diagnosis present

## 2022-12-14 DIAGNOSIS — Z885 Allergy status to narcotic agent status: Secondary | ICD-10-CM

## 2022-12-14 DIAGNOSIS — E1165 Type 2 diabetes mellitus with hyperglycemia: Secondary | ICD-10-CM | POA: Diagnosis present

## 2022-12-14 DIAGNOSIS — E860 Dehydration: Secondary | ICD-10-CM | POA: Diagnosis present

## 2022-12-14 LAB — COMPREHENSIVE METABOLIC PANEL
ALT: 33 U/L (ref 0–44)
AST: 102 U/L — ABNORMAL HIGH (ref 15–41)
Albumin: 3.6 g/dL (ref 3.5–5.0)
Alkaline Phosphatase: 217 U/L — ABNORMAL HIGH (ref 38–126)
Anion gap: 15 (ref 5–15)
BUN: 29 mg/dL — ABNORMAL HIGH (ref 8–23)
CO2: 21 mmol/L — ABNORMAL LOW (ref 22–32)
Calcium: 10.6 mg/dL — ABNORMAL HIGH (ref 8.9–10.3)
Chloride: 100 mmol/L (ref 98–111)
Creatinine, Ser: 1.45 mg/dL — ABNORMAL HIGH (ref 0.44–1.00)
GFR, Estimated: 40 mL/min — ABNORMAL LOW (ref >60)
Glucose, Bld: 103 mg/dL — ABNORMAL HIGH (ref 70–99)
Potassium: 3.4 mmol/L — ABNORMAL LOW (ref 3.5–5.1)
Sodium: 136 mmol/L (ref 135–145)
Total Bilirubin: 2.8 mg/dL — ABNORMAL HIGH (ref 0.3–1.2)
Total Protein: 7.7 g/dL (ref 6.5–8.1)

## 2022-12-14 LAB — CBC
HCT: 35.7 % — ABNORMAL LOW (ref 36.0–46.0)
Hemoglobin: 11.7 g/dL — ABNORMAL LOW (ref 12.0–15.0)
MCH: 27.3 pg (ref 26.0–34.0)
MCHC: 32.8 g/dL (ref 30.0–36.0)
MCV: 83.2 fL (ref 80.0–100.0)
Platelets: 319 10*3/uL (ref 150–400)
RBC: 4.29 MIL/uL (ref 3.87–5.11)
RDW: 16.2 % — ABNORMAL HIGH (ref 11.5–15.5)
WBC: 9.1 10*3/uL (ref 4.0–10.5)
nRBC: 0 % (ref 0.0–0.2)

## 2022-12-14 LAB — LIPASE, BLOOD: Lipase: 25 U/L (ref 11–51)

## 2022-12-14 LAB — URINALYSIS, COMPLETE (UACMP) WITH MICROSCOPIC
Bilirubin Urine: NEGATIVE
Glucose, UA: NEGATIVE mg/dL
Hgb urine dipstick: NEGATIVE
Ketones, ur: NEGATIVE mg/dL
Leukocytes,Ua: NEGATIVE
Nitrite: NEGATIVE
Protein, ur: NEGATIVE mg/dL
Specific Gravity, Urine: 1.009 (ref 1.005–1.030)
pH: 5 (ref 5.0–8.0)

## 2022-12-14 LAB — VITAMIN B12: Vitamin B-12: 6035 pg/mL — ABNORMAL HIGH (ref 180–914)

## 2022-12-14 LAB — HEPATITIS PANEL, ACUTE
HCV Ab: NONREACTIVE
Hep A IgM: NONREACTIVE
Hep B C IgM: NONREACTIVE
Hepatitis B Surface Ag: NONREACTIVE

## 2022-12-14 LAB — PROTIME-INR
INR: 1.1 (ref 0.8–1.2)
Prothrombin Time: 14.7 seconds (ref 11.4–15.2)

## 2022-12-14 LAB — TSH: TSH: 2.187 u[IU]/mL (ref 0.350–4.500)

## 2022-12-14 LAB — SARS CORONAVIRUS 2 BY RT PCR: SARS Coronavirus 2 by RT PCR: NEGATIVE

## 2022-12-14 LAB — HIV ANTIBODY (ROUTINE TESTING W REFLEX): HIV Screen 4th Generation wRfx: NONREACTIVE

## 2022-12-14 MED ORDER — PREDNISONE 20 MG PO TABS
40.0000 mg | ORAL_TABLET | Freq: Every day | ORAL | Status: AC
  Administered 2022-12-14 – 2022-12-19 (×5): 40 mg via ORAL
  Filled 2022-12-14: qty 4
  Filled 2022-12-14 (×4): qty 2

## 2022-12-14 MED ORDER — ACETAMINOPHEN 325 MG PO TABS
650.0000 mg | ORAL_TABLET | Freq: Four times a day (QID) | ORAL | Status: DC | PRN
  Administered 2022-12-14 – 2022-12-17 (×5): 650 mg via ORAL
  Filled 2022-12-14 (×5): qty 2

## 2022-12-14 MED ORDER — LACTATED RINGERS IV SOLN: INTRAVENOUS | Status: AC

## 2022-12-14 MED ORDER — SODIUM CHLORIDE 0.9 % IV BOLUS
500.0000 mL | Freq: Once | INTRAVENOUS | Status: AC
  Administered 2022-12-14: 500 mL via INTRAVENOUS

## 2022-12-14 MED ORDER — AMLODIPINE BESYLATE 10 MG PO TABS
10.0000 mg | ORAL_TABLET | Freq: Every day | ORAL | Status: DC
  Administered 2022-12-15 – 2022-12-21 (×7): 10 mg via ORAL
  Filled 2022-12-14 (×2): qty 1
  Filled 2022-12-14: qty 2
  Filled 2022-12-14 (×4): qty 1

## 2022-12-14 MED ORDER — ADULT MULTIVITAMIN W/MINERALS CH
1.0000 | ORAL_TABLET | Freq: Every day | ORAL | Status: DC
  Administered 2022-12-15 – 2022-12-21 (×7): 1 via ORAL
  Filled 2022-12-14 (×7): qty 1

## 2022-12-14 MED ORDER — ONDANSETRON HCL 4 MG PO TABS: 4.0000 mg | ORAL_TABLET | Freq: Four times a day (QID) | ORAL | Status: DC | PRN

## 2022-12-14 MED ORDER — PANTOPRAZOLE SODIUM 20 MG PO TBEC
20.0000 mg | DELAYED_RELEASE_TABLET | Freq: Two times a day (BID) | ORAL | Status: DC
  Administered 2022-12-14 – 2022-12-21 (×14): 20 mg via ORAL
  Filled 2022-12-14 (×14): qty 1

## 2022-12-14 MED ORDER — ADULT MULTIVITAMIN W/MINERALS CH: 1.0000 | ORAL_TABLET | Freq: Every day | ORAL | Status: DC

## 2022-12-14 MED ORDER — SODIUM CHLORIDE 0.9% FLUSH
3.0000 mL | Freq: Two times a day (BID) | INTRAVENOUS | Status: DC
  Administered 2022-12-15 – 2022-12-21 (×11): 3 mL via INTRAVENOUS

## 2022-12-14 MED ORDER — POLYETHYLENE GLYCOL 3350 17 G PO PACK
17.0000 g | PACK | Freq: Every day | ORAL | Status: DC | PRN
  Administered 2022-12-15 – 2022-12-19 (×4): 17 g via ORAL
  Filled 2022-12-14 (×4): qty 1

## 2022-12-14 MED ORDER — ATORVASTATIN CALCIUM 20 MG PO TABS: 40.0000 mg | ORAL_TABLET | Freq: Every day | ORAL | Status: DC

## 2022-12-14 MED ORDER — HYDRALAZINE HCL 50 MG PO TABS
50.0000 mg | ORAL_TABLET | Freq: Three times a day (TID) | ORAL | Status: DC
  Administered 2022-12-14 – 2022-12-21 (×19): 50 mg via ORAL
  Filled 2022-12-14 (×20): qty 1

## 2022-12-14 MED ORDER — ACETAMINOPHEN 650 MG RE SUPP: 650.0000 mg | Freq: Four times a day (QID) | RECTAL | Status: DC | PRN

## 2022-12-14 MED ORDER — SODIUM CHLORIDE 0.9 % IV BOLUS
1000.0000 mL | Freq: Once | INTRAVENOUS | Status: AC
  Administered 2022-12-14: 1000 mL via INTRAVENOUS

## 2022-12-14 MED ORDER — ONDANSETRON HCL 4 MG/2ML IJ SOLN
4.0000 mg | Freq: Four times a day (QID) | INTRAMUSCULAR | Status: DC | PRN
  Administered 2022-12-15 – 2022-12-17 (×3): 4 mg via INTRAVENOUS
  Filled 2022-12-14 (×3): qty 2

## 2022-12-14 MED ORDER — IPRATROPIUM-ALBUTEROL 0.5-2.5 (3) MG/3ML IN SOLN
3.0000 mL | Freq: Four times a day (QID) | RESPIRATORY_TRACT | Status: AC
  Administered 2022-12-14 – 2022-12-15 (×3): 3 mL via RESPIRATORY_TRACT
  Filled 2022-12-14 (×4): qty 3

## 2022-12-14 MED ORDER — ENOXAPARIN SODIUM 40 MG/0.4ML IJ SOSY
40.0000 mg | PREFILLED_SYRINGE | INTRAMUSCULAR | Status: DC
  Administered 2022-12-14 – 2022-12-18 (×5): 40 mg via SUBCUTANEOUS
  Filled 2022-12-14 (×5): qty 0.4

## 2022-12-14 NOTE — Assessment & Plan Note (Addendum)
-   Resume home antihypertensives 

## 2022-12-14 NOTE — ED Triage Notes (Signed)
Pt here with weakness x3 weeks. Pt has a hx of a stroke. Pt states she has no energy and has not been able to raise her right leg. Pt states she usually can walk, pt states she has fell twice, last fall was last Sat. Pt denies pain.

## 2022-12-14 NOTE — ED Notes (Signed)
MRI called for patient screening

## 2022-12-14 NOTE — ED Notes (Signed)
Pt reports right leg weakness x3 weeks

## 2022-12-14 NOTE — Assessment & Plan Note (Signed)
Likely due to poor p.o. intake with nausea/vomiting over the last couple days.  Symptoms have resolved at this time.  - IV fluids as ordered - Repeat BMP in the a.m. - Avoid nephrotoxic agents

## 2022-12-14 NOTE — Assessment & Plan Note (Signed)
Patient is presenting with sudden onset right lower extremity weakness that began 3 weeks ago. CT of the head and MRI brain negative for any acute infarct, however moderate to severe chronic small vessel disease.  Patient denies any back pain or urinary/bowel incontinence, however given otherwise workup has been negative, will pursue evaluation of the L-spine.  - MRI of the L-spine without contrast - PT/OT - Vitamin B12 - If workup is unremarkable, consider neurology consultation

## 2022-12-14 NOTE — Assessment & Plan Note (Signed)
Patient endorses increasing dyspnea on exertion with diffuse rhonchi and decreased breath sounds on examination.  Given her extensive smoking history, most consistent with COPD that is acutely exacerbated.  Low suspicion for active infection.  Chest x-ray is negative.  - Continuous pulse oximetry - Continue supplemental oxygen to maintain oxygen saturation above 88% - Wean as tolerated - Start prednisone 40 mg to complete a 5-day course - DuoNebs every 6 hours

## 2022-12-14 NOTE — Assessment & Plan Note (Signed)
History of CVA in 2022.  CT and MRI of the brain today stable.  Patient's statin has not been refilled in over 1 year.  - Lipid panel pending - Restart high intensity statin - Aspirin 81 mg daily

## 2022-12-14 NOTE — Assessment & Plan Note (Signed)
Per chart review, patient has a documented history of type 2 diabetes, however no A1c above 6.5% documented.  Last A1c 2022 was 6.0%  - A1c pending

## 2022-12-14 NOTE — ED Provider Notes (Signed)
-----------------------------------------   4:30 PM on 12/14/2022 ----------------------------------------- Patient care assumed from Dr.Kinner.  Patient send she is coming in with increased weakness and shortness of breath with exertion.  Patient's workup so far has not shown any significant findings besides dehydration.  Urinalysis is normal COVID is negative.  CBC is normal including a normal white blood cell count and a hemoglobin of 11.7.  Chemistry does show renal insufficiency with a creatinine 1.45 which increased from her baseline around 1.0.  Patient also has an increased AST and total bilirubin which appears to be new compared to her prior labs.  Patient states she still has her gallbladder on my examination does have mild right upper quadrant tenderness to palpation she states this has been ongoing over the last several weeks.  Denies any alcohol use.  We will obtain a right upper quadrant ultrasound to further evaluate.  Will continue to IV hydrate.  Reassuringly her MRI has come back negative for acute abnormality.  However requiring assistance to get from the bed to the toilet 10 feet away and states she feels very fatigued and doing so.  We will reassess after IV hydration and right upper quadrant ultrasound.  Given her complaints I did perform a bedside ultrasound of her heart which appears to have good squeeze and no pericardial effusion on my exam.  Ultrasound shows signs of cirrhosis.  Patient denies any alcohol use.  Given the patient's renal insufficiency and increased weakness elevated LFTs signs of cirrhosis we will admit to the hospital service for further workup and treatment.   Minna Antis, MD 12/14/22 [phone]

## 2022-12-14 NOTE — ED Notes (Signed)
Pt returned from MRI °

## 2022-12-14 NOTE — ED Notes (Signed)
Pt in MRI at this time 

## 2022-12-14 NOTE — H&P (Addendum)
History and Physical    Patient: Stephanie [name] MVH:[id number] DOB: 1955-10-11 DOA: 12/14/2022 DOS: the patient was seen and examined on 12/14/2022 [facility]  Patient coming from: Home  Chief Complaint:  Chief Complaint  Patient presents with   Weakness   HPI: Stephanie [name] is a 67 y.o. female with medical history significant of hypertension, tobacco use, hyperlipidemia, CVA, type 2 diabetes, who presents to the ED due to right leg weakness.  Stephanie [name] that approximately 3 weeks ago, she had rapid onset right lower extremity weakness.  She feels that her thigh and upper calf are more affected than her feet.  Her symptoms have gradually worsened and led to multiple falls at home.  She denies any loss of consciousness or injuries though.  She denies any focal weakness in any other limb.  She denies any dizziness, vision loss, headache, dysphagia, dysarthria, facial droop.    During this time, she is also noted gradually worsening dyspnea on exertion and fatigue with exertion.  She endorses a cough but denies any shortness of breath at rest.  She notes that she continues to smoke half a pack a day.  She denies any chest pain or palpitations.    She endorses nausea and vomiting that lasted the last 2 to 3 days that was nonbloody, nonmelanotic.  She endorses abdominal pain that was predominantly in the right upper quadrant region that started 1 and half weeks ago and self resolved.  She denies any diarrhea.  She denies any rashes.  ED course: On arrival to the ED, patient was hypertensive at 136/93 with heart rate of 99.  She was saturating at 100% on room air.  She was afebrile at 98.2. Initial workup demonstrates hemoglobin of 11.7, potassium 3.4, bicarb 21, glucose 103, BUN 29, creatinine 1.42, calcium 10.6, alkaline phosphatase 217, AST 102, total bilirubin 2.8 and GFR 40.  CT of the head demonstrated no acute intracranial abnormality.  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  Right upper quadrant ultrasound demonstrating nodular parenchyma with approximately 1 cm liver lesion.  Due to difficulty with ambulation,.  TRH contacted for admission.  Review of Systems: As mentioned in the history of present illness. All other systems reviewed and are negative.  Past Medical History:  Diagnosis Date   Diabetes mellitus without complication (HCC)    Hypertension    Stroke (HCC) 05/17/2020   Past Surgical History:  Procedure Laterality Date   ABDOMINAL HYSTERECTOMY     at 67 years old   CLOSED REDUCTION MANDIBLE Bilateral 05/23/2020   Procedure: CLOSED REDUCTION MANDIBULAR;  Surgeon: Vernie Murders, MD;  Location: [facility] ORS;  Service: ENT;  Laterality: Bilateral;   CLOSED REDUCTION MANDIBLE N/A 06/03/2020   Procedure: CLOSED REDUCTION MANDIBULAR;  Surgeon: Linus Salmons, MD;  Location: [facility] ORS;  Service: ENT;  Laterality: N/A;   COLONOSCOPY WITH PROPOFOL N/A 06/10/2020   Procedure: COLONOSCOPY WITH PROPOFOL;  Surgeon: Pasty Spillers, MD;  Location: [facility];  Service: Endoscopy;  Laterality: N/A;   Social History:  reports that she quit smoking about 2 years ago. Her smoking use included cigarettes. She has never used smokeless tobacco. She reports that she does not drink alcohol and does not use drugs.  Allergies  Allergen Reactions   Codeine Other (See Comments)    hallucinations   Lisinopril Swelling    Family History  Problem Relation  Age of Onset   COPD Mother    Hypertension Mother    Stroke Maternal Grandmother    Hypertension Maternal Grandmother     Prior to Admission medications   Medication Sig Start Date End Date Taking? Authorizing Provider  amLODipine (NORVASC) 10 MG tablet TAKE ONE TABLET BY MOUTH ONCE EVERY DAY FOR HIGH BLOOD PRESSURE 08/19/21 08/19/22    atorvastatin (LIPITOR) 40 MG tablet TAKE  ONE TABLET BY MOUTH ONCE EVERY NIGHT FOR HIGH CHOLESTEROL 08/19/21 08/08/22    clopidogrel (PLAVIX) 75 MG tablet Take 1 tablet (75 mg total) by mouth daily. Hold this medication until you see your PCP. Patient not taking: Reported on 02/24/2021 06/17/20   Charise Killian, MD  clopidogrel (PLAVIX) 75 MG tablet TAKE ONE TABLET BY MOUTH ONCE EVERY DAY. 08/19/21     hydrALAZINE (APRESOLINE) 25 MG tablet Take 1 tablet (25 mg total) by mouth 2 (two) times daily. Patient not taking: Reported on 02/24/2021 05/29/20   [name], MD  hydrALAZINE (APRESOLINE) 50 MG tablet TAKE ONE TABLET BY MOUTH 3 TIMES A DAY 08/19/21 08/19/22    Ipratropium-Albuterol (COMBIVENT) 20-100 MCG/ACT AERS respimat Inhale 1 puff into the lungs every 6 (six) hours. Patient not taking: Reported on 02/24/2021 05/29/20   [name], MD  pantoprazole (PROTONIX) 40 MG tablet Take 1 tablet (40 mg total) by mouth 2 (two) times daily. 06/17/20 07/17/20  Charise Killian, MD  pantoprazole (PROTONIX) 40 MG tablet TAKE ONE TABLET BY MOUTH 2 TIMES A DAY 08/19/21 08/19/22    lisinopril (ZESTRIL) 5 MG tablet TAKE ONE TABLET BY MOUTH EVERY DAY Patient not taking: Reported on 02/24/2021 05/29/20 08/19/21  [name], MD    Physical Exam: Vitals:   12/14/22 1800 12/14/22 1830 12/14/22 1850 12/14/22 2030  BP: (!) 165/100 (!) 172/86  (!) 151/87  Pulse: 72 69 69 96  Resp: 18 16 15 16   Temp:      TempSrc:      SpO2: 99% 96% 96% 99%  Weight:      Height:       Physical Exam Vitals and nursing note reviewed.  Constitutional:      General: She is not in acute distress.    Appearance: She is normal weight.  HENT:     Head: Normocephalic and atraumatic.     Mouth/Throat:     Mouth: Mucous membranes are moist.     Pharynx: Oropharynx is clear.  Eyes:     General: Scleral icterus (Minimal) present.     Pupils: Pupils are equal, round, and reactive to light.  Cardiovascular:     Rate and Rhythm: Normal rate and regular rhythm.     Heart sounds: No  murmur heard.    No gallop.  Pulmonary:     Effort: Pulmonary effort is normal. No tachypnea or accessory muscle usage.     Breath sounds: Decreased breath sounds (Particularly noted on the left side) and rhonchi (Diffuse rhonchi) present.  Abdominal:     General: Bowel sounds are normal. There is no distension.     Palpations: Abdomen is soft.     Tenderness: There is abdominal tenderness (Epigastric and right upper quadrant). There is no guarding.     Hernia: No hernia is present.  Musculoskeletal:     Cervical back: Neck supple. No rigidity.     Right lower leg: No edema.     Left lower leg: No edema.  Skin:    General: Skin is warm and dry.  Neurological:     Mental Status: She is alert.     Comments:  Patient is alert and oriented x 4 5 out of 5 strength of bilateral upper extremities 5 out of 5 strength of left lower extremity 5 out of 5 strength of right foot extension and flexion 3-4 out of 5 strength of the right hip and knee flexion No dysarthria or facial asymmetry Sensation intact throughout  Psychiatric:        Mood and Affect: Mood normal.        Behavior: Behavior normal.    Data Reviewed: CBC with WBC of 9.1, hemoglobin 11.7, MCV of 83, platelets of 07/22/2017 CMP with sodium of 136, potassium 3.4, bicarb 21, glucose 103, BUN 29, creatinine 1.45, calcium 10.6, alkaline phosphatase 217, AST 102, total bilirubin 2.8 and GFR 40 COVID-19 PCR negative Urinalysis negative leukocytes, nitrites but rare bacteria but no WBCs.  EKG personally reviewed.  Sinus rhythm with rate of 101.  No ST or T wave changes concerning for acute ischemia.  US ABDOMEN LIMITED RUQ (LIVER/GB)  Result Date: 12/14/2022 CLINICAL DATA:  Right upper quadrant pain and elevated liver function test. EXAM: ULTRASOUND ABDOMEN LIMITED RIGHT UPPER QUADRANT COMPARISON:  None Available. FINDINGS: Gallbladder: Gallbladder is partially contracted. No gallstones or wall thickening visualized (3.3 mm). No  sonographic Murphy sign noted by sonographer. Common bile duct: Diameter: 1.6 mm Liver: A 1.2 cm x 1.0 cm x 1.2 cm hypoechoic area is seen within the right lobe of the liver. The hepatic parenchyma is nodular in contour and coarse in echogenicity. Portal vein is patent on color Doppler imaging with normal direction of blood flow towards the liver. Other: None. IMPRESSION: 1. Hepatic cirrhosis. 2. 1.2 cm x 1.0 cm x 1.2 cm hypoechoic liver lesion, as described above. MRI correlation is recommended to further exclude the presence of an underlying neoplastic process. Electronically Signed   By: Aram Candela M.D.   On: 12/14/2022 18:10   MR BRAIN WO CONTRAST  Result Date: 12/14/2022 CLINICAL DATA:  Neuro deficit, acute, stroke suspected. Weakness over the last 3 weeks EXAM: MRI HEAD WITHOUT CONTRAST TECHNIQUE: Multiplanar, multiecho pulse sequences of the brain and surrounding structures were obtained without intravenous contrast. COMPARISON:  Head CT same day.  MRI 05/18/2020. FINDINGS: Brain: Diffusion imaging does not show any acute or subacute infarction. Mild chronic small-vessel ischemic change affects pons. 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 Cerebral hemispheres show moderate to marked chronic small-vessel ischemic changes of the white matter, similar to the prior exam. No large vessel territory stroke. No mass lesion, hemorrhage, hydrocephalus or extra-axial collection. 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 Skull and upper cervical spine: Negative Sinuses/Orbits: Clear/normal Other: None IMPRESSION: 1. No acute finding. Moderate to marked chronic small-vessel ischemic changes of the pons and cerebral hemispheric white matter, similar to the study of 05/18/2020. 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 2. 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 This is likely  thrombosed. Electronically Signed   By: Paulina Fusi M.D.   On: 12/14/2022 13:53   CT Head Wo Contrast  Result Date: 12/14/2022 CLINICAL DATA:  Neuro deficit, acute, stroke suspected. Weakness with inability to raise the right leg. Recent falls. EXAM: CT HEAD WITHOUT CONTRAST TECHNIQUE: Contiguous axial images were obtained from the base of the skull through the vertex without intravenous contrast. RADIATION DOSE REDUCTION: This exam was performed according  to the departmental dose-optimization program which includes automated exposure control, adjustment of the mA and/or kV according to patient size and/or use of iterative reconstruction technique. COMPARISON:  Head and neck CTA 05/19/2020 FINDINGS: Brain: There is no evidence of an acute infarct, intracranial hemorrhage, mass, midline shift, or extra-axial fluid collection. Mild cerebral atrophy is within normal limits for age. Patchy hypodensities in the cerebral white matter are similar to the prior study and are nonspecific but compatible with moderate chronic small vessel ischemic disease. Vascular: Calcified atherosclerosis at the skull base. Known 1.4 cm peripherally calcified right cavernous ICA aneurysm, similar in size to the prior CTA. Skull: No acute fracture or suspicious osseous lesion. Sinuses/Orbits: Visualized paranasal sinuses and mastoid air cells are clear. Unremarkable orbits. Other: None. IMPRESSION: 1. No evidence of acute intracranial abnormality. 2. Moderate chronic small vessel ischemic disease. 3. Known right cavernous ICA aneurysm. Electronically Signed   By: Sebastian Ache M.D.   On: 12/14/2022 11:51   DG Chest Port 1 View  Result Date: 12/14/2022 CLINICAL DATA:  Weakness. EXAM: PORTABLE CHEST 1 VIEW COMPARISON:  Chest radiograph 06/01/2020 and CT 06/09/2020 FINDINGS: The cardiomediastinal silhouette is unchanged with normal heart size. Minor bilateral lung scarring is noted. No acute airspace consolidation, edema, sizable pleural  effusion, or pneumothorax is identified. No acute osseous abnormality is seen. IMPRESSION: No active disease. Electronically Signed   By: Sebastian Ache M.D.   On: 12/14/2022 11:46    Results are pending, will review when available.  Assessment and Plan:  * Right leg weakness Patient is presenting with sudden onset right lower extremity weakness that began 3 weeks ago. CT of the head and MRI brain negative for any acute infarct, however moderate to severe chronic small vessel disease.  Patient denies any back pain or urinary/bowel incontinence, however given otherwise workup has been negative, will pursue evaluation of the L-spine.  - MRI of the L-spine without contrast - PT/OT - Vitamin B12 - If workup is unremarkable, consider neurology consultation  Elevated LFTs Patient is presenting with elevated LFTs with AST of 102, ALT of 33, alkaline phosphatase of 217 and total bilirubin of 2.8.  Although she is having some RUQ tenderness, ultrasound was negative.  Hepatic parenchyma was noted to be nodular, however patient has no history of known cirrhosis.  Platelets are within normal limits.  Will check INR, however otherwise no evidence of synthetic hepatic dysfunction to suggest cirrhosis.  No physical exam findings to suggest cirrhosis.  No history of alcohol use.  No known history of viral hepatitis.  - Repeat CMP in the a.m. - PT/INR - Viral hepatitis panel - Hold off on MRCP at this time - Hold home atorvastatin  COPD with acute exacerbation [facility]) Patient endorses increasing dyspnea on exertion with diffuse rhonchi and decreased breath sounds on examination.  Given her extensive smoking history, most consistent with COPD that is acutely exacerbated.  Low suspicion for active infection.  Chest x-ray is negative.  - Continuous pulse oximetry - Continue supplemental oxygen to maintain oxygen saturation above 88% - Wean as tolerated - Start prednisone 40 mg to complete a 5-day course -  DuoNebs every 6 hours  Acute kidney injury (HCC) Likely due to poor p.o. intake with nausea/vomiting over the last couple days.  Symptoms have resolved at this time.  - IV fluids as ordered - Repeat BMP in the a.m. - Avoid nephrotoxic agents  Prediabetes Per chart review, patient has a documented history of type 2 diabetes, however  no A1c above 6.5% documented.  Last A1c 2022 was 6.0%  - A1c pending  Primary hypertension - Resume home antihypertensives  CVA (cerebral vascular accident) (HCC) History of CVA in 2022.  CT and MRI of the brain today stable.  Patient's statin has not been refilled in over 1 year.  - Lipid panel pending - Restart high intensity statin - Aspirin 81 mg daily  Advance Care Planning:   Code Status: DNR patient [name] she would not want cardiac resuscitation in the event of cardiac arrest, however she would want pulmonary resuscitation.  Consults: None  Family Communication: Patient's cousin updated at bedside  Severity of Illness: The appropriate patient status for this patient is OBSERVATION. Observation status is judged to be reasonable and necessary in order to provide the required intensity of service to ensure the patient's safety. The patient's presenting symptoms, physical exam findings, and initial radiographic and laboratory data in the context of their medical condition is felt to place them at decreased risk for further clinical deterioration. Furthermore, it is anticipated that the patient will be medically stable for discharge from the hospital within 2 midnights of admission.   Author: Verdene Lennert, MD 12/14/2022 9:36 PM  For on call review [email or web address].

## 2022-12-14 NOTE — ED Provider Notes (Signed)
[facility] Provider Note    Event Date/Time   First MD Initiated Contact with Patient 12/14/22 1035     (approximate)   History   Weakness   HPI  Stephanie [name] is a 67 y.o. female with a history of CVA, diabetes, hypertension who presents with complaints of right leg weakness.  She reports has been weak for nearly 3 weeks now.  She reports it is heavy and she has to limited with her hands.  She reports this is atypical for her.  She does have a history of a stroke but it did not leave her with any significant long-term deficits.  Typically she is able to ambulate well on her own.  She denies other neurodeficits.  No chest pain abdominal pain or headache reported     Physical Exam   Triage Vital Signs: ED Triage Vitals  Encounter Vitals Group     BP 12/14/22 1035 (!) 159/104     Systolic BP Percentile --      Diastolic BP Percentile --      Pulse Rate 12/14/22 1035 (!) 106     Resp 12/14/22 1035 14     Temp 12/14/22 1035 98.2 F (36.8 C)     Temp Source 12/14/22 1035 Oral     SpO2 12/14/22 1035 94 %     Weight 12/14/22 1037 74 kg (163 lb 2.3 oz)     Height 12/14/22 1037 1.829 m (6')     Head Circumference --      Peak Flow --      Pain Score 12/14/22 1037 0     Pain Loc --      Pain Education --      Exclude from Growth Chart --     Most recent vital signs: Vitals:   12/14/22 1230 12/14/22 1400  BP: (!) 169/91 (!) 162/92  Pulse: 81 78  Resp: 20 15  Temp:    SpO2: 99% 99%     General: Awake, no distress.  CV:  Good peripheral perfusion.  Resp:  Normal effort.  Abd:  No distention.  Other:  Right leg is clearly weaker than the left, unable to lift against gravity, sensation appears intact   ED Results / Procedures / Treatments   Labs (all labs ordered are listed, but only abnormal results are displayed) Labs Reviewed  CBC - Abnormal; Notable for the following components:      Result Value   Hemoglobin 11.7 (*)    HCT 35.7  (*)    RDW 16.2 (*)    All other components within normal limits  COMPREHENSIVE METABOLIC PANEL - Abnormal; Notable for the following components:   Potassium 3.4 (*)    CO2 21 (*)    Glucose, Bld 103 (*)    BUN 29 (*)    Creatinine, Ser 1.45 (*)    Calcium 10.6 (*)    AST 102 (*)    Alkaline Phosphatase 217 (*)    Total Bilirubin 2.8 (*)    GFR, Estimated 40 (*)    All other components within normal limits  SARS CORONAVIRUS 2 BY RT PCR     EKG  ED ECG REPORT I, Jene Every, the attending physician, personally viewed and interpreted this ECG.  Date: 12/14/2022  Rhythm: normal sinus rhythm QRS Axis: normal Intervals: normal ST/T Wave abnormalities: normal Narrative Interpretation: no evidence of acute ischemia    RADIOLOGY Chest x-ray viewed interpret by me, no acute abnormality  PROCEDURES:  Critical Care performed:   Procedures   MEDICATIONS ORDERED IN ED: Medications  sodium chloride 0.9 % bolus 500 mL (0 mLs Intravenous Stopped 12/14/22 1356)     IMPRESSION / MDM / ASSESSMENT AND PLAN / ED COURSE  I reviewed the triage vital signs and the nursing notes. Patient's presentation is most consistent with acute presentation with potential threat to life or bodily function.  Patient presents with right leg weakness as detailed above, concerning for CVA, differential also includes electrolyte abnormalities, no evidence of leg ischemia swelling or trauma.  However additionally she also complains of weakness which could be related to infection such as pneumonia, COVID or UTI  Will send for CT head, obtain labs and reevaluate.  CT head unremarkable, MR obtained which is normal  Still pending urinalysis and COVID test.  IV fluids are infusing.  I have asked my colleague to follow-up on further tests and reevaluation, anticipate admission      FINAL CLINICAL IMPRESSION(S) / ED DIAGNOSES   Final diagnoses:  Generalized weakness  Dehydration     Rx /  DC Orders   ED Discharge Orders     None        Note:  This document was prepared using Dragon voice recognition software and may include unintentional dictation errors.   Jene Every, MD 12/14/22 1430

## 2022-12-14 NOTE — Assessment & Plan Note (Addendum)
Patient is presenting with elevated LFTs with AST of 102, ALT of 33, alkaline phosphatase of 217 and total bilirubin of 2.8.  Although she is having some RUQ tenderness, ultrasound was negative.  Hepatic parenchyma was noted to be nodular, however patient has no history of known cirrhosis.  Platelets are within normal limits.  Will check INR, however otherwise no evidence of synthetic hepatic dysfunction to suggest cirrhosis.  No physical exam findings to suggest cirrhosis.  No history of alcohol use.  No known history of viral hepatitis.  - Repeat CMP in the a.m. - PT/INR - Viral hepatitis panel - Hold off on MRCP at this time - Hold home atorvastatin

## 2022-12-15 ENCOUNTER — Observation Stay: Payer: 59

## 2022-12-15 ENCOUNTER — Encounter: Payer: Self-pay | Admitting: Internal Medicine

## 2022-12-15 DIAGNOSIS — R292 Abnormal reflex: Secondary | ICD-10-CM

## 2022-12-15 DIAGNOSIS — E860 Dehydration: Secondary | ICD-10-CM | POA: Diagnosis not present

## 2022-12-15 DIAGNOSIS — R9389 Abnormal findings on diagnostic imaging of other specified body structures: Secondary | ICD-10-CM

## 2022-12-15 DIAGNOSIS — R29898 Other symptoms and signs involving the musculoskeletal system: Secondary | ICD-10-CM | POA: Diagnosis not present

## 2022-12-15 DIAGNOSIS — R531 Weakness: Secondary | ICD-10-CM

## 2022-12-15 DIAGNOSIS — J441 Chronic obstructive pulmonary disease with (acute) exacerbation: Secondary | ICD-10-CM | POA: Diagnosis not present

## 2022-12-15 LAB — LIPID PANEL
Cholesterol: 136 mg/dL (ref 0–200)
HDL: 36 mg/dL — ABNORMAL LOW (ref >40)
LDL Cholesterol: 81 mg/dL (ref 0–99)
Total CHOL/HDL Ratio: 3.8 RATIO
Triglycerides: 95 mg/dL (ref <150)
VLDL: 19 mg/dL (ref 0–40)

## 2022-12-15 MED ORDER — GADOBUTROL 1 MMOL/ML IV SOLN
7.0000 mL | Freq: Once | INTRAVENOUS | Status: AC | PRN
  Administered 2022-12-15: 7 mL via INTRAVENOUS

## 2022-12-15 NOTE — TOC Initial Note (Addendum)
Transition of Care [facility]) - Initial/Assessment Note    Patient Details  Name: Stephanie [name] MRN: [id number] Date of Birth: 05/19/1955  Transition of Care [facility]) CM/SW Contact:    Margarito Liner, LCSW Phone Number: 12/15/2022, 3:27 PM  Clinical Narrative:   CSW met with patient. No supports at bedside. CSW introduced role and explained that therapy recommendations would be discussed. Patient is agreeable to SNF placement. First preference is [facility]. Admissions coordinator is aware and will review. No further concerns. CSW encouraged patient to contact CSW as needed. CSW will continue to follow patient for support and facilitate discharge to SNF once medically stable.            4:08 pm: [facility] has offered a bed. Per MD, potential discharge tomorrow. CSW started [place]. Patient is aware.     Expected Discharge Plan: Skilled Nursing Facility Barriers to Discharge: Continued Medical Work up   Patient Goals and CMS Choice   CMS Medicare.gov Compare Post Acute Care list provided to:: Patient        Expected Discharge Plan and Services     Post Acute Care Choice: Skilled Nursing Facility Living arrangements for the past 2 months: Single Family Home                                      Prior Living Arrangements/Services Living arrangements for the past 2 months: Single Family Home Lives with:: Relatives Patient language and need for interpreter reviewed:: Yes Do you feel safe going back to the place where you live?: Yes      Need for Family Participation in Patient Care: Yes (Comment) Care giver support system in place?: Yes (comment)   Criminal Activity/Legal Involvement Pertinent to Current Situation/Hospitalization: No - Comment as needed  Activities of Daily Living Home Assistive Devices/Equipment: Dan Humphreys (specify type) ADL Screening (condition at time of admission) Patient's cognitive ability adequate to safely complete daily activities?:  Yes Is the patient deaf or have difficulty hearing?: No Does the patient have difficulty seeing, even when wearing glasses/contacts?: No Does the patient have difficulty concentrating, remembering, or making decisions?: No Patient able to express need for assistance with ADLs?: Yes Does the patient have difficulty dressing or bathing?: No Independently performs ADLs?: Yes (appropriate for developmental age) Does the patient have difficulty walking or climbing stairs?: Yes Weakness of Legs: Both Weakness of Arms/Hands: Both  Permission Sought/Granted Permission sought to share information with : Facility Industrial/product designer granted to share information with : Yes, Verbal Permission Granted     Permission granted to share info w AGENCY: SNF's        Emotional Assessment Appearance:: Appears stated age Attitude/Demeanor/Rapport: Engaged, Gracious Affect (typically observed): Accepting, Appropriate, Calm, Pleasant Orientation: : Oriented to Self, Oriented to Place, Oriented to  Time, Oriented to Situation Alcohol / Substance Use: Not Applicable Psych Involvement: No (comment)  Admission diagnosis:  Dehydration [E86.0] Right leg weakness [R29.898] Generalized weakness [R53.1] Patient Active Problem List   Diagnosis Date Noted   Right leg weakness 12/14/2022   Elevated LFTs 12/14/2022   COPD with acute exacerbation (HCC) 12/14/2022   Prediabetes 12/14/2022   Severe anemia 06/05/2020   Acute kidney injury (HCC) 06/02/2020   Intractable vomiting with nausea 06/01/2020   AKI (acute kidney injury) (HCC) 06/01/2020   Gastroenteritis due to COVID-19 virus 06/01/2020   Weakness    Primary hypertension  Acute respiratory failure with hypoxia (HCC) 05/19/2020   COVID-19 05/18/2020   Dehydration 05/18/2020   Slurred speech 05/18/2020   CVA (cerebral vascular accident) [facility]) 05/18/2020   PCP:  [facility]:   [facility] [address] [place] [address] Phone: [phone] Fax: [phone]     Social Determinants of Health (SDOH) Social History: SDOH Screenings   Food Insecurity: No Food Insecurity (12/15/2022)  Housing: Low Risk  (12/15/2022)  Transportation Needs: No Transportation Needs (12/15/2022)  Utilities: Not At Risk (12/15/2022)  Tobacco Use: Medium Risk (12/15/2022)   SDOH Interventions:     Readmission Risk Interventions     No data to display

## 2022-12-15 NOTE — Progress Notes (Signed)
Progress Note   Patient: Stephanie [name] XBJ:[id number] DOB: May 01, 1956 DOA: 12/14/2022     0 DOS: the patient was seen and examined on 12/15/2022   Brief hospital course: Stephanie [name] is a 67 y.o. [name] with medical history significant of hypertension, tobacco use, hyperlipidemia, CVA, type 2 diabetes, who presents to the ED due to right leg weakness.   Stephanie [name] states that approximately 3 weeks ago, she had rapid onset right lower extremity weakness.  She feels that her thigh and upper calf are more affected than her feet.  Her symptoms have gradually worsened and led to multiple falls at home.  She denies any loss of consciousness or injuries though.  She denies any focal weakness in any other limb.  She denies any dizziness, vision loss, headache, dysphagia, dysarthria, facial droop.  Assessment and Plan: * Right leg weakness Right lower extremity weakness that began 3 weeks ago.  CT of the head and MRI brain negative for any acute infarct, however moderate to severe chronic small vessel disease.   MRI L spine reviewed with neurosurgery - not significant to cause symptoms. Neurology evaluation appreciated. Advised MRI C spine given all 4 limbs weak with brisk reflexes. PT advised rehab placement.  Elevated LFTs RUQ sono shows cirrhosis, 1.2 cm hypoechoic lesion. MRI lumbar spine shows benign lesions of liver though not fully visualized. Hepatitis panel negative. PT/INR wnl, Hold home atorvastatin. Trend LFT.  COPD with acute exacerbation (HCC) Continuous pulse oximetry Supplemental oxygen to maintain oxygen saturation above 88% Continue prednisone 40 mg to complete a 5-day course DuoNebs every 6 hours  Acute kidney injury (HCC) Kidney function improved with IV fluids. Avoid nephrotoxic agents. Hold Lasix. Encourage oral diet.  Prediabetes A1c ordered to prior labs. Diet, exercise and weight reduction advised.  Primary hypertension Continue Amlodipine.  CVA  (cerebral vascular accident) (HCC) Lipid panel pending Continue Aspirin 81 mg daily. Statin held due to abnormal LFT.   Supportive care. DVT prophylaxis Lovenox. CODE STATUS DNR.      Subjective: Patient is seen and examined today morning.  She is eating her breakfast, occupational therapist at bedside.  Does complain of right leg weakness, difficulty ambulation.  Denies any dizziness, headache, tingling numbness.  Patient has been having right leg weakness for last few weeks.  Physical Exam: Vitals:   12/15/22 0600 12/15/22 0700 12/15/22 0800 12/15/22 1134  BP: (!) 152/89 (!) 154/91 (!) 158/87 (!) 160/86  Pulse: (!) 101 95 99 99  Resp: 20 (!) 23 (!) 22 20  Temp:    98.2 F (36.8 C)  TempSrc:    Oral  SpO2: 95% 97% 99% 97%  Weight:      Height:       General - Stephanie [name], no apparent distress HEENT - PERRLA, EOMI, atraumatic head, non tender sinuses. Lung - Clear, rales, rhonchi, wheezes. Heart - S1, S2 heard, no murmurs, rubs, trace pedal edema Neuro - Alert, awake and oriented,  Skin - Warm and dry. Data Reviewed:     Latest Ref Rng & Units 12/15/2022    2:22 AM 12/14/2022   10:49 AM 06/17/2020    6:31 AM  CBC  WBC 4.0 - 10.5 K/uL 7.2  9.1  4.7   Hemoglobin 12.0 - 15.0 g/dL 30.8  65.7  7.8   Hematocrit 36.0 - 46.0 % 34.5  35.7  23.9   Platelets 150 - 400 K/uL 310  319  314        Latest Ref  Rng & Units 12/15/2022    2:22 AM 12/14/2022   10:49 AM 06/17/2020    6:31 AM  BMP  Glucose 70 - 99 mg/dL 244  010  97   BUN 8 - 23 mg/dL 26  29  14    Creatinine 0.44 - 1.00 mg/dL 2.72  5.36  6.44   Sodium 135 - 145 mmol/L 135  136  140   Potassium 3.5 - 5.1 mmol/L 3.7  3.4  3.8   Chloride 98 - 111 mmol/L 105  100  106   CO2 22 - 32 mmol/L 20  21  27    Calcium 8.9 - 10.3 mg/dL 03.4  74.2  59.5    MR LUMBAR SPINE WO CONTRAST  Result Date: 12/14/2022 CLINICAL DATA:  Initial evaluation for acute myelopathy. EXAM: MRI LUMBAR SPINE WITHOUT CONTRAST  TECHNIQUE: Multiplanar, multisequence MR imaging of the lumbar spine was performed. No intravenous contrast was administered. COMPARISON:  None Available. FINDINGS: Segmentation:  Standard. Alignment:  Physiologic. Vertebrae: Vertebral body height maintained without acute or chronic fracture. Bone marrow signal intensity diffusely heterogeneous. No worrisome osseous lesions or abnormal marrow edema. Conus medullaris and cauda equina: Conus extends to the T12 level. Conus and cauda equina appear normal. Paraspinal and other soft tissues: Paraspinous soft tissues within normal limits. Few scattered T2 hyperintense cyst noted about the kidneys, largest of which measures 2 cm on the left. These are benign in appearance, with no follow-up imaging recommended. Innumerable cystic lesions noted within the partially visualized liver. Disc levels: L1-2: Disc desiccation with minimal annular bulge. Mild facet hypertrophy. No stenosis. L2-3: Degenerative intervertebral disc space narrowing with circumferential disc bulge and disc desiccation. Mild reactive endplate spurring. Mild bilateral facet hypertrophy. No significant spinal stenosis. Foramina remain patent. L3-4: Disc desiccation with mild diffuse disc bulge. Mild reactive endplate spurring. Mild facet and ligament flavum hypertrophy. Resultant mild-to-moderate spinal stenosis. Mild bilateral L3 foraminal narrowing. L4-5: Disc desiccation with diffuse disc bulge. Superimposed small right subarticular disc protrusion with slight inferior migration and annular fissure. Protruding disc contacts the descending right L5 nerve root in the right lateral recess (series 12, image 31). Moderate bilateral facet arthrosis. Resultant moderate canal with bilateral subarticular stenosis. Mild bilateral L4 foraminal narrowing. L5-S1:  Negative interspace.  Mild facet hypertrophy.  No stenosis. IMPRESSION: 1. Disc bulge with small right subarticular disc protrusion at L4-5, potentially  affecting the descending right L5 nerve root. Moderate canal with mild bilateral L4 foraminal stenosis at this level. 2. Disc bulge with facet hypertrophy at L3-4 with resultant mild to moderate spinal stenosis, with mild bilateral L3 foraminal narrowing. 3. Innumerable cystic lesions within the visualized liver. While these could reflect benign cysts, these are incompletely assessed on this exam. Correlation with dedicated abdominal MRI recommended for further evaluation. Electronically Signed   By: Rise Mu M.D.   On: 12/14/2022 22:30   US ABDOMEN LIMITED RUQ (LIVER/GB)  Result Date: 12/14/2022 CLINICAL DATA:  Right upper quadrant pain and elevated liver function test. EXAM: ULTRASOUND ABDOMEN LIMITED RIGHT UPPER QUADRANT COMPARISON:  None Available. FINDINGS: Gallbladder: Gallbladder is partially contracted. No gallstones or wall thickening visualized (3.3 mm). No sonographic Murphy sign noted by sonographer. Common bile duct: Diameter: 1.6 mm Liver: A 1.2 cm x 1.0 cm x 1.2 cm hypoechoic area is seen within the right lobe of the liver. The hepatic parenchyma is nodular in contour and coarse in echogenicity. Portal vein is patent on color Doppler imaging with normal direction of blood flow towards the  liver. Other: None. IMPRESSION: 1. Hepatic cirrhosis. 2. 1.2 cm x 1.0 cm x 1.2 cm hypoechoic liver lesion, as described above. MRI correlation is recommended to further exclude the presence of an underlying neoplastic process. Electronically Signed   By: Aram Candela M.D.   On: 12/14/2022 18:10    Family Communication: Patient and her sister at bedside updated regarding current care plan.  Disposition: Status is: Observation The patient remains OBS appropriate and will d/c before 2 midnights.  Planned Discharge Destination: Skilled nursing facility    Time spent: 43 minutes  Author: Marcelino Duster, MD 12/15/2022 2:54 PM  For on call review [email or web address].

## 2022-12-15 NOTE — Consult Note (Signed)
NEUROLOGY CONSULTATION NOTE   Date of service: December 15, 2022 Patient Name: Stephanie [name] MRN:  [id number] DOB:  08-01-1955 Reason for consult: RLE weakness Requesting physician: Park Meo MD _ _ _   _ __   _ __ _ _  __ __   _ __   __ _  History of Present Illness   This is a 67 yo woman with hx DM2, HTN, stroke in 2022 who is admitted for RLE weakness. She reports she has weakness in all of her extremities but she is weakest in her RLE which has led to several falls at home (no injuries sustained). She reports that the RLE weakness began approx 3 weeks ago. MRI brain showed mod-to-severe chronic small vessel disease and chronic R cerebellar infarct. MRI L spine showed multilevel mild-to-moderate spinal and neuroforaminal stenosis with mild R L4-5 disc protrusion potentially affecting R L5 nerve root. CNS imaging personally reviewed.   ROS   Per HPI: all other systems reviewed and are negative  Past History   I have reviewed the following:  Past Medical History:  Diagnosis Date   Diabetes mellitus without complication (HCC)    Hypertension    Stroke (HCC) 05/17/2020   Past Surgical History:  Procedure Laterality Date   ABDOMINAL HYSTERECTOMY     at 67 years old   CLOSED REDUCTION MANDIBLE Bilateral 05/23/2020   Procedure: CLOSED REDUCTION MANDIBULAR;  Surgeon: Vernie Murders, MD;  Location: [facility] ORS;  Service: ENT;  Laterality: Bilateral;   CLOSED REDUCTION MANDIBLE N/A 06/03/2020   Procedure: CLOSED REDUCTION MANDIBULAR;  Surgeon: Linus Salmons, MD;  Location: [facility] ORS;  Service: ENT;  Laterality: N/A;   COLONOSCOPY WITH PROPOFOL N/A 06/10/2020   Procedure: COLONOSCOPY WITH PROPOFOL;  Surgeon: Pasty Spillers, MD;  Location: [facility] ENDOSCOPY;  Service: Endoscopy;  Laterality: N/A;   Family History  Problem Relation Age of Onset   COPD Mother    Hypertension Mother    Stroke Maternal Grandmother    Hypertension Maternal Grandmother    Social History   Socioeconomic  History   Marital status: Single    Spouse name: Not on file   Number of children: Not on file   Years of education: Not on file   Highest education level: Not on file  Occupational History   Not on file  Tobacco Use   Smoking status: Former    Current packs/day: 0.00    Types: Cigarettes    Quit date: 04/2020    Years since quitting: 2.6   Smokeless tobacco: Never  Vaping Use   Vaping status: Never Used  Substance and Sexual Activity   Alcohol use: Never   Drug use: Never   Sexual activity: Not on file  Other Topics Concern   Not on file  Social History Narrative   Not on file   Social Determinants of Health   Financial Resource Strain: Not on file  Food Insecurity: No Food Insecurity (12/15/2022)   Hunger Vital Sign    Worried About Running Out of Food in the Last Year: Never true    Ran Out of Food in the Last Year: Never true  Transportation Needs: No Transportation Needs (12/15/2022)   PRAPARE - Administrator, Civil Service (Medical): No    Lack of Transportation (Non-Medical): No  Physical Activity: Not on file  Stress: Not on file  Social Connections: Not on file   Allergies  Allergen Reactions   Codeine Other (See Comments)  hallucinations   Lisinopril Swelling    Medications   Medications Prior to Admission  Medication Sig Dispense Refill Last Dose   amLODipine (NORVASC) 10 MG tablet TAKE ONE TABLET BY MOUTH ONCE EVERY DAY FOR HIGH BLOOD PRESSURE 90 tablet 2 Past Week   atorvastatin (LIPITOR) 40 MG tablet TAKE ONE TABLET BY MOUTH ONCE EVERY NIGHT FOR HIGH CHOLESTEROL 90 tablet 2 Past Week   hydrALAZINE (APRESOLINE) 50 MG tablet TAKE ONE TABLET BY MOUTH 3 TIMES A DAY 270 tablet 2 Past Week   pantoprazole (PROTONIX) 40 MG tablet TAKE ONE TABLET BY MOUTH 2 TIMES A DAY 180 tablet 0 Past Week   clopidogrel (PLAVIX) 75 MG tablet Take 1 tablet (75 mg total) by mouth daily. Hold this medication until you see your PCP. (Patient not taking: Reported  on 02/24/2021) 30 tablet 2 Completed Course   clopidogrel (PLAVIX) 75 MG tablet TAKE ONE TABLET BY MOUTH ONCE EVERY DAY. (Patient not taking: Reported on 12/14/2022) 90 tablet 3 Completed Course   hydrALAZINE (APRESOLINE) 25 MG tablet Take 1 tablet (25 mg total) by mouth 2 (two) times daily. (Patient not taking: Reported on 02/24/2021) 60 tablet 2 Completed Course   Ipratropium-Albuterol (COMBIVENT) 20-100 MCG/ACT AERS respimat Inhale 1 puff into the lungs every 6 (six) hours. (Patient not taking: Reported on 02/24/2021) 1 each 0 Completed Course   pantoprazole (PROTONIX) 40 MG tablet Take 1 tablet (40 mg total) by mouth 2 (two) times daily. (Patient not taking: Reported on 12/14/2022) 60 tablet 0 Completed Course      Current Facility-Administered Medications:    acetaminophen (TYLENOL) tablet 650 mg, 650 mg, Oral, Q6H PRN, 650 mg at 12/15/22 1152 **OR** acetaminophen (TYLENOL) suppository 650 mg, 650 mg, Rectal, Q6H PRN, Verdene Lennert, MD   amLODipine (NORVASC) tablet 10 mg, 10 mg, Oral, Daily, Verdene Lennert, MD, 10 mg at 12/15/22 1103   enoxaparin (LOVENOX) injection 40 mg, 40 mg, Subcutaneous, Q24H, Verdene Lennert, MD, 40 mg at 12/14/22 2146   hydrALAZINE (APRESOLINE) tablet 50 mg, 50 mg, Oral, Q8H, Verdene Lennert, MD, 50 mg at 12/15/22 1424   ipratropium-albuterol (DUONEB) 0.5-2.5 (3) MG/3ML nebulizer solution 3 mL, 3 mL, Nebulization, Q6H, Verdene Lennert, MD, 3 mL at 12/15/22 0754   multivitamin with minerals tablet 1 tablet, 1 tablet, Oral, Daily, Otelia Sergeant, RPH, 1 tablet at 12/15/22 1104   ondansetron (ZOFRAN) tablet 4 mg, 4 mg, Oral, Q6H PRN **OR** ondansetron (ZOFRAN) injection 4 mg, 4 mg, Intravenous, Q6H PRN, Verdene Lennert, MD   pantoprazole (PROTONIX) EC tablet 20 mg, 20 mg, Oral, BID, Verdene Lennert, MD, 20 mg at 12/15/22 1104   polyethylene glycol (MIRALAX / GLYCOLAX) packet 17 g, 17 g, Oral, Daily PRN, Verdene Lennert, MD, 17 g at 12/15/22 1540   predniSONE (DELTASONE)  tablet 40 mg, 40 mg, Oral, Q breakfast, Verdene Lennert, MD, 40 mg at 12/14/22 2031   sodium chloride flush (NS) 0.9 % injection 3 mL, 3 mL, Intravenous, Q12H, Verdene Lennert, MD  Vitals   Vitals:   12/15/22 0700 12/15/22 0800 12/15/22 1134 12/15/22 1653  BP: (!) 154/91 (!) 158/87 (!) 160/86 (!) 152/80  Pulse: 95 99 99 (!) 104  Resp: (!) 23 (!) 22 20 18   Temp:   98.2 F (36.8 C) 98 F (36.7 C)  TempSrc:   Oral   SpO2: 97% 99% 97% 95%  Weight:      Height:         Body mass index is 22.13 kg/m.  Physical Exam  Physical Exam Gen: A&O x4, NAD Resp: CTAB, no w/r/r CV: RRR, no m/g/r; nml S1 and S2. 2+ symmetric peripheral pulses.  Neuro: *MS: A&O x4. Follows multi-step commands.  *Speech: fluid, nondysarthric, able to name and repeat *CN:    I: Deferred   II,III: PERRLA, VFF by confrontation, optic discs unable to be visualized 2/2 pupillary constriction   III,IV,VI: EOMI w/o nystagmus, no ptosis   V: Sensation intact from V1 to V3 to LT   VII: Eyelid closure was full.  Smile symmetric.   VIII: Hearing intact to voice   IX,X: Voice normal, palate elevates symmetrically    XI: SCM/trap 5/5 bilat   XII: Tongue protrudes midline, no atrophy or fasciculations  *Motor:   Normal bulk.  No tremor, rigidity or bradykinesia. BUE 4/5 throughout except 4-/5 bilateral triceps. LLE 4+/5 throughout, RLE HF 4-/5, KF 4-/5, KE 3/5, DF/PF 4-/5. *Sensory: SILT. No double-simultaneous extinction.  *Coordination:  FNF intact bilat *Reflexes:  3+ and symmetric BUE, 2+ brisk BLE no clonus *Gait: deferred   Labs   CBC:  Recent Labs  Lab 12/14/22 1049 12/15/22 0222  WBC 9.1 7.2  HGB 11.7* 11.3*  HCT 35.7* 34.5*  MCV 83.2 82.7  PLT 319 310    Basic Metabolic Panel:  Lab Results  Component Value Date   NA 135 12/15/2022   K 3.7 12/15/2022   CO2 20 (L) 12/15/2022   GLUCOSE 125 (H) 12/15/2022   BUN 26 (H) 12/15/2022   CREATININE 1.16 (H) 12/15/2022   CALCIUM 10.2 12/15/2022    GFRNONAA 52 (L) 12/15/2022   Lipid Panel:  Lab Results  Component Value Date   LDLCALC 81 12/15/2022   HgbA1c:  Lab Results  Component Value Date   HGBA1C 6.0 (H) 05/28/2020   Urine Drug Screen:     Component Value Date/Time   LABOPIA NONE DETECTED 05/18/2020 0632   COCAINSCRNUR NONE DETECTED 05/18/2020 0632   LABBENZ NONE DETECTED 05/18/2020 0632   AMPHETMU NONE DETECTED 05/18/2020 0632   THCU NONE DETECTED 05/18/2020 0632   LABBARB NONE DETECTED 05/18/2020 0632    Alcohol Level     Component Value Date/Time   ETH <10 05/18/2020 0206    MRI Brain 1. No acute finding. Moderate to marked chronic small-vessel ischemic changes of the pons and cerebral hemispheric white matter, similar to the study of 05/18/2020. 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 2. 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  MRI l spine wo contrast 1. Disc bulge with small right subarticular disc protrusion at L4-5, potentially affecting the descending right L5 nerve root. Moderate canal with mild bilateral L4 foraminal stenosis at this level. 2. Disc bulge with facet hypertrophy at L3-4 with resultant mild to moderate spinal stenosis, with mild bilateral L3 foraminal narrowing. 3. Innumerable cystic lesions within the visualized liver. While these could reflect benign cysts, these are incompletely assessed on this exam. Correlation with dedicated abdominal MRI recommended for further evaluation.  CNS imaging personally reviewed; I agree with above interpretations   Impression   This is a 67 yo woman with hx DM2, HTN, stroke in 2022 who presents with weakness in all extremities, worst in the RLE, impairing gait and leading to frequent falls. On exam she has symmetric weakness BUE and RLE>LLE weakness with diffuse hyperreflexia and no sensory deficits. She does have possible impingement of the R L5 nerve root on MRI L spine but the  findings on imaging are  unlikely to be severe enough to cause weakness and her weakness is not limited to her RLE, and within her RLE are not c/w L5 distribution. Symptoms are more consistent with localization to the c spine therefore will obtain MRI for further evaluation.  Recommendations   - MRI c spine wwo contrast - Will continue to follow ______________________________________________________________________   Thank you for the opportunity to take part in the care of this patient. If you have any further questions, please contact the neurology consultation attending.  Signed,  Bing Neighbors, MD [facility] [phone]  If 7pm- 7am, please page neurology on call as listed in AMION.  **Any copied and pasted documentation in this note was written by me in another application not billed for and pasted by me into this document.

## 2022-12-15 NOTE — ED Notes (Signed)
Upon entering room, pt noted to have pulled IV out of arm. Arm cleaned and bandaged. Will assess for alternate IV site.

## 2022-12-15 NOTE — [place] FL2 (Signed)
[place] MEDICAID [id number] LEVEL OF CARE FORM     IDENTIFICATION  Patient Name: Stephanie [name] Birthdate: 1955/08/03 Sex: female Admission Date (Current Location): 12/14/2022  [facility] and [place] Number:  Chiropodist and Address:  [facility], [address], [place], [place] [address]      Provider Number: [id number]  Attending Physician Name and Address:  Marcelino Duster, MD  Relative Name and Phone Number:       Current Level of Care: Hospital Recommended Level of Care: Skilled Nursing Facility Prior Approval Number:    Date Approved/Denied:   PASRR Number: [id number]  Discharge Plan: SNF    Current Diagnoses: Patient Active Problem List   Diagnosis Date Noted   Right leg weakness 12/14/2022   Elevated LFTs 12/14/2022   COPD with acute exacerbation (HCC) 12/14/2022   Prediabetes 12/14/2022   Severe anemia 06/05/2020   Acute kidney injury (HCC) 06/02/2020   Intractable vomiting with nausea 06/01/2020   AKI (acute kidney injury) (HCC) 06/01/2020   Gastroenteritis due to COVID-19 virus 06/01/2020   Weakness    Primary hypertension    Acute respiratory failure with hypoxia (HCC) 05/19/2020   COVID-19 05/18/2020   Dehydration 05/18/2020   Slurred speech 05/18/2020   CVA (cerebral vascular accident) (HCC) 05/18/2020    Orientation RESPIRATION BLADDER Height & Weight     Self, Time, Situation, Place  Normal Continent Weight: 163 lb 2.3 oz (74 kg) Height:  6' (182.9 cm)  BEHAVIORAL SYMPTOMS/MOOD NEUROLOGICAL BOWEL NUTRITION STATUS   (None)  (None) Continent Diet (Carb modified)  AMBULATORY STATUS COMMUNICATION OF NEEDS Skin   Limited Assist Verbally Normal                       Personal Care Assistance Level of Assistance  Bathing, Feeding, Dressing Bathing Assistance: Limited assistance Feeding assistance: Limited assistance Dressing Assistance: Limited assistance     Functional Limitations Info  Sight,  Hearing, Speech Sight Info: Adequate Hearing Info: Adequate Speech Info: Adequate    SPECIAL CARE FACTORS FREQUENCY  PT (By licensed PT), OT (By licensed OT)     PT Frequency: 5 x week OT Frequency: 5 x week            Contractures Contractures Info: Not present    Additional Factors Info  Code Status, Allergies Code Status Info: DNR Allergies Info: Codeine, Lisinopril           Current Medications (12/15/2022):  This is the current hospital active medication list Current Facility-Administered Medications  Medication Dose Route Frequency Provider Last Rate Last Admin   acetaminophen (TYLENOL) tablet 650 mg  650 mg Oral Q6H PRN Verdene Lennert, MD   650 mg at 12/15/22 1152   Or   acetaminophen (TYLENOL) suppository 650 mg  650 mg Rectal Q6H PRN Verdene Lennert, MD       amLODipine (NORVASC) tablet 10 mg  10 mg Oral Daily Verdene Lennert, MD   10 mg at 12/15/22 1103   enoxaparin (LOVENOX) injection 40 mg  40 mg Subcutaneous Q24H Verdene Lennert, MD   40 mg at 12/14/22 2146   hydrALAZINE (APRESOLINE) tablet 50 mg  50 mg Oral Q8H Verdene Lennert, MD   50 mg at 12/15/22 1424   ipratropium-albuterol (DUONEB) 0.5-2.5 (3) MG/3ML nebulizer solution 3 mL  3 mL Nebulization Q6H Verdene Lennert, MD   3 mL at 12/15/22 0754   multivitamin with minerals tablet 1 tablet  1 tablet Oral Daily Dawayne Cirri  S, RPH   1 tablet at 12/15/22 1104   ondansetron (ZOFRAN) tablet 4 mg  4 mg Oral Q6H PRN Verdene Lennert, MD       Or   ondansetron (ZOFRAN) injection 4 mg  4 mg Intravenous Q6H PRN Verdene Lennert, MD       pantoprazole (PROTONIX) EC tablet 20 mg  20 mg Oral BID Verdene Lennert, MD   20 mg at 12/15/22 1104   polyethylene glycol (MIRALAX / GLYCOLAX) packet 17 g  17 g Oral Daily PRN Verdene Lennert, MD       predniSONE (DELTASONE) tablet 40 mg  40 mg Oral Q breakfast Verdene Lennert, MD   40 mg at 12/14/22 2031   sodium chloride flush (NS) 0.9 % injection 3 mL  3 mL Intravenous Q12H  Verdene Lennert, MD         Discharge Medications: Please see discharge summary for a list of discharge medications.  Relevant Imaging Results:  Relevant Lab Results:   Additional Information SS#: [id number]  Margarito Liner, LCSW

## 2022-12-15 NOTE — Care Management Obs Status (Signed)
MEDICARE OBSERVATION STATUS NOTIFICATION   Patient Details  Name: Stephanie [name] MRN: [id number] Date of Birth: 03/03/1956   Medicare Observation Status Notification Given:  Yes    Margarito Liner, LCSW 12/15/2022, 3:19 PM

## 2022-12-15 NOTE — ED Notes (Signed)
PT at bedside.

## 2022-12-15 NOTE — Evaluation (Signed)
Occupational Therapy Evaluation Patient Details Name: Stephanie [name] MRN: [id number] DOB: [birth year] Today's Date: 12/15/2022   History of Present Illness Stephanie [name] is a 67 y.o. female with medical history significant of hypertension, tobacco use, hyperlipidemia, CVA, type 2 diabetes, who presents to the ED due to right leg weakness. 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 Right upper quadrant ultrasound demonstrating nodular parenchyma with approximately 1 cm liver lesion.   Clinical Impression   [name] was seen for OT evaluation this date. Prior to hospital admission, pt was active and independent. She endorses being active with her family and friends ambulating without a device until ~2 weeks PTA. Pt endorses progressive weakness/fatigue with minimal exertion over the last few weeks. Pt lives in a basement apartment with ~ 2 STE at the outside entrance and full flight of steps into the main home where her brother resides. Pt states her brother and other family members would be available to provide intermittent assistance as needed. Pt presents to acute OT demonstrating impaired ADL performance and functional mobility 2/2 generalized weakness, decreased activity tolerance, and limited cardiopulmonary status (See OT problem list for additional functional deficits). Pt currently requires MIN A for bed mobility, close min guard for brief functional transfers, and MOD A for more exertional ADL management such as LB bathing and dressing.  Pt would benefit from skilled OT services to address noted impairments and functional limitations (see below for any additional details) in order to maximize safety and independence while minimizing falls risk and caregiver burden. Anticipate the need for follow up OT services upon acute hospital DC (< 3 hours/day).       Recommendations for follow  up therapy are one component of a multi-disciplinary discharge planning process, led by the attending physician.  Recommendations may be updated based on patient status, additional functional criteria and insurance authorization.   Assistance Recommended at Discharge Intermittent Supervision/Assistance  Patient can return home with the following A lot of help with walking and/or transfers;A lot of help with bathing/dressing/bathroom;Assistance with cooking/housework;Help with stairs or ramp for entrance;Assist for transportation    Functional Status Assessment  Patient has had a recent decline in their functional status and demonstrates the ability to make significant improvements in function in a reasonable and predictable amount of time.  Equipment Recommendations  BSC/3in1    Recommendations for Other Services       Precautions / Restrictions Precautions Precautions: Fall Precaution Comments: Watch HR Restrictions Weight Bearing Restrictions: No      Mobility Bed Mobility Overal bed mobility: Needs Assistance Bed Mobility: Supine to Sit, Sit to Supine     Supine to sit: Min assist Sit to supine: Min assist   General bed mobility comments: MIN A to bring BLE over EOB    Transfers Overall transfer level: Needs assistance Equipment used: Rolling walker (2 wheels) Transfers: Sit to/from Stand Sit to Stand: Min guard, Min assist                  Balance Overall balance assessment: Needs assistance Sitting-balance support: No upper extremity supported, Feet unsupported Sitting balance-Leahy Scale: Good     Standing balance support: Reliant on assistive device for balance, Bilateral upper extremity supported Standing balance-Leahy Scale: Fair  ADL either performed or assessed with clinical judgement   ADL Overall ADL's : Needs assistance/impaired                                     Functional mobility  during ADLs: Min guard;Minimal assistance;Rolling walker (2 wheels) General ADL Comments: Pt functionally limited by generalized weakness, decreased activity tolerance, and RLE weakness. She reports significant increased fatigue with minimal activity. She requires MIN A to come to sit at EOB, close min guard to take side steps toward [facility], She is able to maintain sitting balance to eat breakfast for ~10 min before requesting return to supine 2/2 fatigue.     Vision Patient Visual Report: No change from baseline       Perception     Praxis      Pertinent Vitals/Pain Pain Assessment Pain Assessment: Faces Faces Pain Scale: Hurts a little bit Pain Location: mild stomach pain (RUQ) but improving from previous date. Pain Intervention(s): Limited activity within patient's tolerance, Monitored during session     Hand Dominance Right   Extremity/Trunk Assessment Upper Extremity Assessment Upper Extremity Assessment: Generalized weakness (no focal deficit appreciated. Grip strength is grossly symmetrical (2+/5), AROM WFL, pt endorses mild R sided tremors upon admission but states they have improved since previous date.)   Lower Extremity Assessment Lower Extremity Assessment: Generalized weakness;Defer to PT evaluation       Communication Communication Communication: No difficulties   Cognition Arousal/Alertness: Awake/alert Behavior During Therapy: WFL for tasks assessed/performed Overall Cognitive Status: Within Functional Limits for tasks assessed                                 General Comments: Pleasant, conversational, eager to find out why she has been feeling so weak.     General Comments  Pt noted with resting HR in 90's, HR reaches 131 with short functional transfer at EOB. Pt notably SOB endorses significant fatigue. MD notified via secure chat.    Exercises Other Exercises Other Exercises: Pt educated on role of OT in acute setting, safety, falls  prevention strategies, safe use of AE/DME for ADL management, and routines modifications to support safety and functional independence.   Shoulder Instructions      Home Living Family/patient expects to be discharged to:: Private residence Living Arrangements: Other relatives (brother lives upstairs, can assist PRN) Available Help at Discharge: Family Type of Home: House (lives in basement apartment) Home Access: Stairs to enter (stairs from house down to basement apartment. Level entry from back yard into her part of the home.) Entrance Stairs-Number of Steps: flight         Bathroom Shower/Tub: Chief Strategy Officer: Standard (riser)     Home Equipment: Agricultural consultant (2 wheels);BSC/3in1;Shower seat          Prior Functioning/Environment Prior Level of Function : Independent/Modified Independent             Mobility Comments: Has had two falls in the last 2 weeks, otherwise no additional falls history. does not use AE for mobility ADLs Comments: Active, independent, has been expriencing progressive weakness over the last few weeks.        OT Problem List: Decreased strength;Decreased coordination;Cardiopulmonary status limiting activity;Decreased activity tolerance;Decreased safety awareness;Impaired balance (sitting and/or standing);Decreased knowledge of use of DME or AE;Impaired UE functional use  OT Treatment/Interventions: Self-care/ADL training;Therapeutic exercise;Therapeutic activities;DME and/or AE instruction;Patient/family education;Balance training;Energy conservation    OT Goals(Current goals can be found in the care plan section) Acute Rehab OT Goals Patient Stated Goal: to feel better OT Goal Formulation: With patient Time For Goal Achievement: 12/29/22 Potential to Achieve Goals: Good ADL Goals Pt Will Perform Grooming: standing;with set-up;with supervision Pt Will Perform Lower Body Dressing: sit to/from stand;with supervision;with  set-up;with adaptive equipment Pt Will Transfer to Toilet: bedside commode;ambulating;with modified independence Pt Will Perform Toileting - Clothing Manipulation and hygiene: sit to/from stand;with set-up;with supervision;with adaptive equipment  OT Frequency: Min 1X/week    Co-evaluation              AM-PAC OT "6 Clicks" Daily Activity     Outcome Measure Help from another person eating meals?: None Help from another person taking care of personal grooming?: A Little Help from another person toileting, which includes using toliet, bedpan, or urinal?: A Little Help from another person bathing (including washing, rinsing, drying)?: A Little Help from another person to put on and taking off regular upper body clothing?: A Little Help from another person to put on and taking off regular lower body clothing?: A Little 6 Click Score: 19   End of Session Equipment Utilized During Treatment: Gait belt;Rolling walker (2 wheels) Nurse Communication: Mobility status  Activity Tolerance: Patient tolerated treatment well Patient left: in bed;with call bell/phone within reach;with bed alarm set  OT Visit Diagnosis: Other abnormalities of gait and mobility (R26.89);Muscle weakness (generalized) (M62.81)                Time: 0981-1914 OT Time Calculation (min): 32 min Charges:  OT General Charges $OT Visit: 1 Visit OT Evaluation $OT Eval Moderate Complexity: 1 Mod OT Treatments $Self Care/Home Management : 8-22 mins  Rockney Ghee, M.S., OTR/L 12/15/22, 11:04 AM

## 2022-12-15 NOTE — Evaluation (Addendum)
Physical Therapy Evaluation Patient Details Name: Stephanie [name] MRN: [id number] DOB: 11/22/1955 Today's Date: 12/15/2022  History of Present Illness  Stephanie [name] is a 67 y.o. female with medical history significant of hypertension, tobacco use, hyperlipidemia, CVA, type 2 diabetes, who presents to the ED due to right leg weakness. 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 Right upper quadrant ultrasound demonstrating nodular parenchyma with approximately 1 cm liver lesion.  Clinical Impression   Pt presents laying in bed, some pain in abdominal region. She currently lives with her brother in the basement of a house with a level back entry and 8+2 stairs with R rail if entering from the front of the house. PTA she was independent with all mobility and ADLs.   Pt able to perform supine> sit minA to assist with LE movement. She attempted to perform sit<>stand with RW from a normal height but was unable to stand. Pt able to sit<>stand from elevated height with minA and ambulate ~22ft around bed with minA, RW, and 1 rest break. Pt noted increased effort with walking and some SOB but SPO2 remained >90%. Pt would benefit from continued skilled care to maximize functional independence.       If plan is discharge home, recommend the following: Assistance with cooking/housework;Direct supervision/assist for financial management;Assist for transportation;Help with stairs or ramp for entrance;Direct supervision/assist for medications management;A little help with walking and/or transfers;A lot of help with bathing/dressing/bathroom   Can travel by private vehicle   No    Equipment Recommendations Rolling walker (2 wheels)  Recommendations for Other Services       Functional Status Assessment Patient has had a recent decline in their functional status and demonstrates the ability to make  significant improvements in function in a reasonable and predictable amount of time.     Precautions / Restrictions Precautions Precautions: Fall Precaution Comments: Watch HR Restrictions Weight Bearing Restrictions: No      Mobility  Bed Mobility Overal bed mobility: Needs Assistance Bed Mobility: Supine to Sit, Sit to Supine     Supine to sit: Min assist Sit to supine: Min assist   General bed mobility comments: MinA to assist guiding LE on/off EOB    Transfers Overall transfer level: Needs assistance Equipment used: Rolling walker (2 wheels) Transfers: Sit to/from Stand Sit to Stand: Min assist, From elevated surface           General transfer comment: attempted standing without elevated surface, unable to achieve after multiple attempts    Ambulation/Gait Ambulation/Gait assistance: Min assist Gait Distance (Feet): 40 Feet Assistive device: Rolling walker (2 wheels) Gait Pattern/deviations: Antalgic Gait velocity: decreased     General Gait Details: walking around bed required moderate effort, pt noted some SOB but O2 remained >90%,  Stairs            Wheelchair Mobility     Tilt Bed    Modified Rankin (Stroke Patients Only)       Balance Overall balance assessment: Needs assistance Sitting-balance support: No upper extremity supported, Feet unsupported Sitting balance-Leahy Scale: Good     Standing balance support: Reliant on assistive device for balance, Bilateral upper extremity supported Standing balance-Leahy Scale: Poor Standing balance comment: relies on RW for balance                             Pertinent  Vitals/Pain Pain Assessment Pain Assessment: Faces Faces Pain Scale: Hurts little more Pain Location: stomach Pain Descriptors / Indicators: Constant, Discomfort, Grimacing Pain Intervention(s): Monitored during session, Limited activity within patient's tolerance    Home Living Family/patient expects to be  discharged to:: Private residence Living Arrangements: Other relatives (brother) Available Help at Discharge: Family (Aunt) Type of Home: House (lives in basement) Home Access: Stairs to enter;Level entry (stairs if entering house from front, level entry in back enterance) Entrance Stairs-Rails: Right Entrance Stairs-Number of Steps: 8 +2   Home Layout: One level Home Equipment: Pharmacist, hospital (2 wheels)      Prior Function Prior Level of Function : Independent/Modified Independent             Mobility Comments: 2 falls within the last 2 weeks, 1 due to leg weakness and unsure about the other, no AD for mobility ADLs Comments: Active, independent, has been expriencing progressive weakness over the last few weeks.     Hand Dominance   Dominant Hand: Right    Extremity/Trunk Assessment   Upper Extremity Assessment Upper Extremity Assessment: Generalized weakness (MMT: b/l shoulder abd: 3+/5, b/l elbow ext 3+/5, flexion 4/5)    Lower Extremity Assessment Lower Extremity Assessment: Generalized weakness (MMT: LLE: gross 4/5, RLE: hip flex 3-/5, knee ext 2/5)       Communication   Communication: No difficulties  Cognition Arousal/Alertness: Awake/alert Behavior During Therapy: WFL for tasks assessed/performed Overall Cognitive Status: Within Functional Limits for tasks assessed                                          General Comments General comments (skin integrity, edema, etc.): Pt noted with resting HR in 90's, HR reaches 131 with short functional transfer at EOB. Pt notably SOB endorses significant fatigue. MD notified via secure chat.    Exercises     Assessment/Plan    PT Assessment Patient needs continued PT services  PT Problem List Decreased strength;Decreased range of motion;Decreased activity tolerance;Decreased balance;Decreased mobility;Decreased safety awareness;Decreased knowledge of use of DME       PT Treatment  Interventions DME instruction;Therapeutic exercise;Gait training;Balance training;Stair training;Functional mobility training;Therapeutic activities;Patient/family education    PT Goals (Current goals can be found in the Care Plan section)  Acute Rehab PT Goals Patient Stated Goal: return home PT Goal Formulation: With patient Time For Goal Achievement: 12/29/22 Potential to Achieve Goals: Fair    Frequency Min 1X/week     Co-evaluation               AM-PAC PT "6 Clicks" Mobility  Outcome Measure Help needed turning from your back to your side while in a flat bed without using bedrails?: A Little Help needed moving from lying on your back to sitting on the side of a flat bed without using bedrails?: A Little Help needed moving to and from a bed to a chair (including a wheelchair)?: A Lot Help needed standing up from a chair using your arms (e.g., wheelchair or bedside chair)?: A Lot Help needed to walk in hospital room?: A Little Help needed climbing 3-5 steps with a railing? : A Lot 6 Click Score: 15    End of Session Equipment Utilized During Treatment: Gait belt Activity Tolerance: Patient limited by fatigue;Patient tolerated treatment well Patient left: in bed;with call bell/phone within reach;with bed alarm set   PT Visit Diagnosis:  Other abnormalities of gait and mobility (R26.89);Difficulty in walking, not elsewhere classified (R26.2);History of falling (Z91.81);Muscle weakness (generalized) (M62.81)    Time: 1610-9604 PT Time Calculation (min) (ACUTE ONLY): 31 min   Charges:   PT Evaluation $PT Eval Low Complexity: 1 Low PT Treatments $Therapeutic Activity: 23-37 mins PT General Charges $$ ACUTE PT VISIT: 1 Visit       Kyerra Vargo, PT, SPT 12:16 PM,12/15/22

## 2022-12-16 DIAGNOSIS — F17210 Nicotine dependence, cigarettes, uncomplicated: Secondary | ICD-10-CM | POA: Diagnosis present

## 2022-12-16 DIAGNOSIS — R531 Weakness: Secondary | ICD-10-CM | POA: Diagnosis present

## 2022-12-16 DIAGNOSIS — R160 Hepatomegaly, not elsewhere classified: Secondary | ICD-10-CM | POA: Diagnosis not present

## 2022-12-16 DIAGNOSIS — Z823 Family history of stroke: Secondary | ICD-10-CM | POA: Diagnosis not present

## 2022-12-16 DIAGNOSIS — Z66 Do not resuscitate: Secondary | ICD-10-CM | POA: Diagnosis present

## 2022-12-16 DIAGNOSIS — E860 Dehydration: Secondary | ICD-10-CM | POA: Diagnosis present

## 2022-12-16 DIAGNOSIS — Z825 Family history of asthma and other chronic lower respiratory diseases: Secondary | ICD-10-CM | POA: Diagnosis not present

## 2022-12-16 DIAGNOSIS — Z8673 Personal history of transient ischemic attack (TIA), and cerebral infarction without residual deficits: Secondary | ICD-10-CM | POA: Diagnosis not present

## 2022-12-16 DIAGNOSIS — E7800 Pure hypercholesterolemia, unspecified: Secondary | ICD-10-CM | POA: Diagnosis present

## 2022-12-16 DIAGNOSIS — E1165 Type 2 diabetes mellitus with hyperglycemia: Secondary | ICD-10-CM | POA: Diagnosis present

## 2022-12-16 DIAGNOSIS — Z1152 Encounter for screening for COVID-19: Secondary | ICD-10-CM | POA: Diagnosis not present

## 2022-12-16 DIAGNOSIS — Z515 Encounter for palliative care: Secondary | ICD-10-CM | POA: Diagnosis not present

## 2022-12-16 DIAGNOSIS — M5126 Other intervertebral disc displacement, lumbar region: Secondary | ICD-10-CM | POA: Diagnosis present

## 2022-12-16 DIAGNOSIS — J441 Chronic obstructive pulmonary disease with (acute) exacerbation: Secondary | ICD-10-CM | POA: Diagnosis present

## 2022-12-16 DIAGNOSIS — Z7902 Long term (current) use of antithrombotics/antiplatelets: Secondary | ICD-10-CM | POA: Diagnosis not present

## 2022-12-16 DIAGNOSIS — K7469 Other cirrhosis of liver: Secondary | ICD-10-CM | POA: Diagnosis present

## 2022-12-16 DIAGNOSIS — M47816 Spondylosis without myelopathy or radiculopathy, lumbar region: Secondary | ICD-10-CM | POA: Diagnosis present

## 2022-12-16 DIAGNOSIS — R0609 Other forms of dyspnea: Secondary | ICD-10-CM | POA: Diagnosis not present

## 2022-12-16 DIAGNOSIS — Z7982 Long term (current) use of aspirin: Secondary | ICD-10-CM | POA: Diagnosis not present

## 2022-12-16 DIAGNOSIS — C50812 Malignant neoplasm of overlapping sites of left female breast: Secondary | ICD-10-CM | POA: Diagnosis present

## 2022-12-16 DIAGNOSIS — C7951 Secondary malignant neoplasm of bone: Secondary | ICD-10-CM | POA: Diagnosis present

## 2022-12-16 DIAGNOSIS — I10 Essential (primary) hypertension: Secondary | ICD-10-CM | POA: Diagnosis present

## 2022-12-16 DIAGNOSIS — N179 Acute kidney failure, unspecified: Secondary | ICD-10-CM | POA: Diagnosis present

## 2022-12-16 DIAGNOSIS — R29898 Other symptoms and signs involving the musculoskeletal system: Secondary | ICD-10-CM | POA: Diagnosis not present

## 2022-12-16 DIAGNOSIS — Z888 Allergy status to other drugs, medicaments and biological substances status: Secondary | ICD-10-CM | POA: Diagnosis not present

## 2022-12-16 DIAGNOSIS — R296 Repeated falls: Secondary | ICD-10-CM | POA: Diagnosis present

## 2022-12-16 DIAGNOSIS — C787 Secondary malignant neoplasm of liver and intrahepatic bile duct: Secondary | ICD-10-CM | POA: Diagnosis present

## 2022-12-16 DIAGNOSIS — Z79899 Other long term (current) drug therapy: Secondary | ICD-10-CM | POA: Diagnosis not present

## 2022-12-16 DIAGNOSIS — Z8249 Family history of ischemic heart disease and other diseases of the circulatory system: Secondary | ICD-10-CM | POA: Diagnosis not present

## 2022-12-16 NOTE — Plan of Care (Signed)
  Problem: Pain Managment: Goal: General experience of comfort will improve Outcome: Progressing   Problem: Safety: Goal: Ability to remain free from injury will improve Outcome: Progressing   Problem: Skin Integrity: Goal: Risk for impaired skin integrity will decrease Outcome: Progressing   Problem: Elimination: Goal: Will not experience complications related to bowel motility Outcome: Progressing Goal: Will not experience complications related to urinary retention Outcome: Progressing   Problem: Nutrition: Goal: Adequate nutrition will be maintained Outcome: Progressing   Problem: Activity: Goal: Risk for activity intolerance will decrease Outcome: Progressing   Problem: Clinical Measurements: Goal: Ability to maintain clinical measurements within normal limits will improve Outcome: Progressing Goal: Will remain free from infection Outcome: Progressing Goal: Diagnostic test results will improve Outcome: Progressing Goal: Respiratory complications will improve Outcome: Progressing Goal: Cardiovascular complication will be avoided Outcome: Progressing   Problem: Education: Goal: Knowledge of General Education information will improve Description: Including pain rating scale, medication(s)/side effects and non-pharmacologic comfort measures Outcome: Progressing

## 2022-12-16 NOTE — Progress Notes (Deleted)
Neurology progress note  MRI c spine wwo showed no findings to explain weakness. MRI t spine wwo ordered. Neurology will f/u tmrw.  Bing Neighbors, MD [facility] [phone]  If 7pm- 7am, please page neurology on call as listed in AMION.

## 2022-12-16 NOTE — Plan of Care (Signed)
Neurology progress note  MRI c spine wwo showed no findings to explain weakness. MRI t spine wwo ordered. Neurology will f/u tmrw.  Bing Neighbors, MD [facility] [phone]  If 7pm- 7am, please page neurology on call as listed in AMION.

## 2022-12-16 NOTE — Progress Notes (Signed)
Occupational Therapy Treatment Patient Details Name: Stephanie [name] MRN: [id number] DOB: February 18, 1956 Today's Date: 12/16/2022   History of present illness Stephanie [name] is a 67 y.o. female with medical history significant of hypertension, tobacco use, hyperlipidemia, CVA, type 2 diabetes, who presents to the ED due to right leg weakness. 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 Right upper quadrant ultrasound demonstrating nodular parenchyma with approximately 1 cm liver lesion.   OT comments   Upon entering the room, pt supine in bed and sleeping soundly. Pt is agreeable to OT intervention. Lunch tray in front of pt and she was unaware. Pt performing bed mobility without assistance to EOB and stands with min A and use of RW. Pt ambulating with supervision into bathroom 20' for toileting needs. Pt manages clothing and hygiene with supervision overall. Pt standing at sink for hand hygiene with supervision and then ambulates in same manner as above to recliner chair to sit up for meal. Chair alarm activated and call bell within reach. Pt's RN arrives with medication as therapist exits the room.    Recommendations for follow up therapy are one component of a multi-disciplinary discharge planning process, led by the attending physician.  Recommendations may be updated based on patient status, additional functional criteria and insurance authorization.    Assistance Recommended at Discharge Intermittent Supervision/Assistance  Patient can return home with the following  A lot of help with walking and/or transfers;A lot of help with bathing/dressing/bathroom;Assistance with cooking/housework;Help with stairs or ramp for entrance;Assist for transportation   Equipment Recommendations  BSC/3in1       Precautions / Restrictions Precautions Precautions: Fall Precaution Comments: Watch HR        Mobility Bed Mobility Overal bed mobility: Modified Independent             General bed mobility comments: no physical assistance provided but pt needing increased time and effort    Transfers Overall transfer level: Needs assistance Equipment used: Rolling walker (2 wheels) Transfers: Sit to/from Stand Sit to Stand: Min assist                 Balance Overall balance assessment: Needs assistance Sitting-balance support: No upper extremity supported, Feet unsupported Sitting balance-Leahy Scale: Good     Standing balance support: Reliant on assistive device for balance, Bilateral upper extremity supported, During functional activity Standing balance-Leahy Scale: Fair                             ADL either performed or assessed with clinical judgement   ADL Overall ADL's : Needs assistance/impaired     Grooming: Standing;Min guard;Wash/dry Lawyer: Minimal assistance;Regular Toilet;Rolling walker (2 wheels)   Toileting- Architect and Hygiene: Supervision/safety;Sit to/from stand              Extremity/Trunk Assessment Upper Extremity Assessment Upper Extremity Assessment: Generalized weakness   Lower Extremity Assessment Lower Extremity Assessment: Generalized weakness        Vision Patient Visual Report: No change from baseline            Cognition Arousal/Alertness: Awake/alert Behavior During Therapy: WFL for tasks assessed/performed Overall Cognitive Status: Within Functional Limits for tasks assessed  Pertinent Vitals/ Pain       Pain Assessment Pain Assessment: No/denies pain         Frequency  Min 1X/week        Progress Toward Goals  OT Goals(current goals can now be found in the care plan section)  Progress towards OT goals: Progressing toward goals     Plan Discharge plan remains  appropriate;Frequency remains appropriate       AM-PAC OT "6 Clicks" Daily Activity     Outcome Measure   Help from another person eating meals?: None Help from another person taking care of personal grooming?: A Little Help from another person toileting, which includes using toliet, bedpan, or urinal?: A Little Help from another person bathing (including washing, rinsing, drying)?: A Little Help from another person to put on and taking off regular upper body clothing?: None Help from another person to put on and taking off regular lower body clothing?: A Little 6 Click Score: 20    End of Session Equipment Utilized During Treatment: Rolling walker (2 wheels)  OT Visit Diagnosis: Other abnormalities of gait and mobility (R26.89);Muscle weakness (generalized) (M62.81)   Activity Tolerance Patient tolerated treatment well   Patient Left in chair;with call bell/phone within reach;with chair alarm set;with nursing/sitter in room   Nurse Communication Mobility status        Time: 9323-5573 OT Time Calculation (min): 12 min  Charges: OT General Charges $OT Visit: 1 Visit OT Treatments $Self Care/Home Management : 8-22 mins  Jackquline Denmark, MS, OTR/L , CBIS ascom [phone]  12/16/22, 2:51 PM

## 2022-12-16 NOTE — TOC Progression Note (Addendum)
Transition of Care [facility]) - Progression Note    Patient Details  Name: Stephanie [name] MRN: [id number] Date of Birth: 1955/06/15  [facility] CM/SW Contact  Margarito Liner, LCSW Phone Number: 12/16/2022, 7:59 AM  Clinical Narrative:   SNF insurance authorization is still pending.  10:46 am: Auth approved: [id number]. Valid 8/1-8/5.  Expected Discharge Plan: Skilled Nursing Facility Barriers to Discharge: Continued Medical Work up  Expected Discharge Plan and Services     Post Acute Care Choice: Skilled Nursing Facility Living arrangements for the past 2 months: Single Family Home                                       Social Determinants of Health (SDOH) Interventions SDOH Screenings   Food Insecurity: No Food Insecurity (12/15/2022)  Housing: Low Risk  (12/15/2022)  Transportation Needs: No Transportation Needs (12/15/2022)  Utilities: Not At Risk (12/15/2022)  Tobacco Use: Medium Risk (12/15/2022)    Readmission Risk Interventions     No data to display

## 2022-12-16 NOTE — Progress Notes (Addendum)
Consulting Department:  Marcelino Duster, MD  Primary Physician:  [facility]  Chief Complaint: Lower extremity weakness  History of Present Illness: 12/16/2022 Stephanie [name] is a 67 y.o. female who presents with the chief complaint of lower extremity weakness.  She is found to have a lumbar MRI with some mild stenosis.  Evaluated by our colleagues in neurology who found her to have hyperreflexia so upper motor neuron and central nervous system imaging was ordered.  She has reported all 4 extremities are weak but notices severe weakness in her right lower extremity.  She has had previous falls.  She states it has been approximately 3 weeks.  She does have a chronic right cerebellar infarct and some vascular disease.  She does not discuss sensation changes.  But she does feel her right lower extremity does not feel like her own.  The symptoms are causing a significant impact on the patient's life.   Review of Systems:  A 10 point review of systems is negative, except for the pertinent positives and negatives detailed in the HPI.  Past Medical History: Past Medical History:  Diagnosis Date   Diabetes mellitus without complication (HCC)    Hypertension    Stroke (HCC) 05/17/2020    Past Surgical History: Past Surgical History:  Procedure Laterality Date   ABDOMINAL HYSTERECTOMY     at 67 years old   CLOSED REDUCTION MANDIBLE Bilateral 05/23/2020   Procedure: CLOSED REDUCTION MANDIBULAR;  Surgeon: Vernie Murders, MD;  Location: [facility] ORS;  Service: ENT;  Laterality: Bilateral;   CLOSED REDUCTION MANDIBLE N/A 06/03/2020   Procedure: CLOSED REDUCTION MANDIBULAR;  Surgeon: Linus Salmons, MD;  Location: [facility] ORS;  Service: ENT;  Laterality: N/A;   COLONOSCOPY WITH PROPOFOL N/A 06/10/2020   Procedure: COLONOSCOPY WITH PROPOFOL;  Surgeon: Pasty Spillers, MD;  Location: [facility] ENDOSCOPY;  Service: Endoscopy;  Laterality: N/A;    Allergies: Allergies as of  12/14/2022 - Review Complete 12/14/2022  Allergen Reaction Noted   Codeine Other (See Comments) 12/14/2022   Lisinopril Swelling 09/13/2020    Medications:  Current Facility-Administered Medications:    acetaminophen (TYLENOL) tablet 650 mg, 650 mg, Oral, Q6H PRN, 650 mg at 12/15/22 2348 **OR** acetaminophen (TYLENOL) suppository 650 mg, 650 mg, Rectal, Q6H PRN, Verdene Lennert, MD   amLODipine (NORVASC) tablet 10 mg, 10 mg, Oral, Daily, Verdene Lennert, MD, 10 mg at 12/15/22 1103   enoxaparin (LOVENOX) injection 40 mg, 40 mg, Subcutaneous, Q24H, Verdene Lennert, MD, 40 mg at 12/15/22 2144   hydrALAZINE (APRESOLINE) tablet 50 mg, 50 mg, Oral, Q8H, Verdene Lennert, MD, 50 mg at 12/16/22 0534   multivitamin with minerals tablet 1 tablet, 1 tablet, Oral, Daily, Otelia Sergeant, RPH, 1 tablet at 12/15/22 1104   ondansetron (ZOFRAN) tablet 4 mg, 4 mg, Oral, Q6H PRN **OR** ondansetron (ZOFRAN) injection 4 mg, 4 mg, Intravenous, Q6H PRN, Verdene Lennert, MD, 4 mg at 12/15/22 2308   pantoprazole (PROTONIX) EC tablet 20 mg, 20 mg, Oral, BID, Verdene Lennert, MD, 20 mg at 12/15/22 2145   polyethylene glycol (MIRALAX / GLYCOLAX) packet 17 g, 17 g, Oral, Daily PRN, Verdene Lennert, MD, 17 g at 12/15/22 1540   predniSONE (DELTASONE) tablet 40 mg, 40 mg, Oral, Q breakfast, Verdene Lennert, MD, 40 mg at 12/14/22 2031   sodium chloride flush (NS) 0.9 % injection 3 mL, 3 mL, Intravenous, Q12H, Verdene Lennert, MD, 3 mL at 12/15/22 2148   Social History: Social History   Tobacco Use   Smoking  status: Former    Current packs/day: 0.00    Types: Cigarettes    Quit date: 04/2020    Years since quitting: 2.6   Smokeless tobacco: Never  Vaping Use   Vaping status: Never Used  Substance Use Topics   Alcohol use: Never   Drug use: Never    Family Medical History: Family History  Problem Relation Age of Onset   COPD Mother    Hypertension Mother    Stroke Maternal Grandmother    Hypertension  Maternal Grandmother     Physical Examination: Vitals:   12/16/22 0315 12/16/22 0533  BP: (!) 144/64 (!) 146/79  Pulse: 96 93  Resp: 18   Temp: 98 F (36.7 C)   SpO2: 92%      General: Patient is well developed, well nourished, calm, collected, and in no apparent distress.  NEUROLOGICAL:  General: In no acute distress.   Awake, alert, oriented to person, place, and time.  Pupils equal round and reactive to light.  Facial tone is symmetric.  Tongue protrusion is midline.  There is no pronator drift.   Strength: Normal bulk.  No tremor, rigidity or bradykinesia. BUE 4/5 throughout except 4-/5 bilateral triceps. LLE 4+/5 throughout, RLE HF 2/5, KF 4-/5, KE 3/5, DF/PF 4-/5.  Patient acknowledges a soft touch sensory level at approximately the umbilicus.  She is not completely insensate below this level, but does state that she feels a significant change.  Reflexes are 3+ throughout.  Possible Hoffman's on the right, questionable on the left.  Clonus is difficult to examine given patient's inability to relax..  Toes are upgoing bilaterally.  Imaging: Narrative & Impression  CLINICAL DATA:  Initial evaluation for weakness, hyper reflexia.   EXAM: MRI CERVICAL SPINE WITHOUT AND WITH CONTRAST   TECHNIQUE: Multiplanar and multiecho pulse sequences of the cervical spine, to include the craniocervical junction and cervicothoracic junction, were obtained without and with intravenous contrast.   CONTRAST:  7mL GADAVIST GADOBUTROL 1 MMOL/ML IV SOLN   COMPARISON:  None Available.   FINDINGS: Alignment: Examination mildly degraded by motion.   Straightening of the normal cervical lordosis. No significant listhesis.   Vertebrae: Vertebral body height maintained without acute or chronic fracture. Bone marrow signal intensity diffusely heterogeneous. Subcentimeter stir hyperintense lesions involving the T2 and T3 vertebral bodies noted, favored to reflect small  atypical hemangiomata noted. No worrisome osseous lesions. No other abnormal marrow edema or enhancement.   Cord: Normal signal and morphology. No convincing cord signal changes seen on this motion degraded exam. No abnormal enhancement.   Posterior Fossa, vertebral arteries, paraspinal tissues: Patient's known cavernous ICA aneurysm noted, partially visualized. Craniocervical junction within normal limits. Paraspinous soft tissues within normal limits. Normal flow voids seen within the vertebral arteries bilaterally.   Disc levels:   C2-C3: Mild disc bulge. No spinal stenosis. Foramina remain patent.   C3-C4: Mild disc bulge with uncovertebral spurring. Flattening and partial effacement of the ventral thecal sac with resultant mild spinal stenosis. Mild left C4 foraminal narrowing. Right neural foramen remains patent.   C4-C5: Mild diffuse disc bulge with uncovertebral spurring. Flattening and partial effacement of the ventral thecal sac. Mild cord flattening without cord signal changes. Mild spinal stenosis. Foramina remain patent.   C5-C6: Mild disc bulge with uncovertebral spurring. Flattening and partial effacement of the ventral thecal sac with resultant mild spinal stenosis. Foramina remain patent.   C6-C7: Degenerative intervertebral disc space narrowing with diffuse disc osteophyte complex. Posterior component flattens and partially effaces  the ventral thecal sac. No more than mild spinal stenosis. Mild left C7 foraminal narrowing. Right neural foramen remains patent.   C7-T1: Mild degenerative disc space narrowing with diffuse disc bulge and uncovertebral spurring. Mild bilateral facet hypertrophy. Resultant mild spinal stenosis. Mild left C8 foraminal narrowing. Right neural foramen remains patent.   IMPRESSION: 1. Normal MRI appearance of the cervical spinal cord. No cord signal changes to suggest myelopathy or other abnormality. 2. Multilevel cervical  spondylosis with resultant mild diffuse spinal stenosis at C3-4 through C7-T1. 3. Mild left C4, C7, and C8 foraminal stenosis related to disc bulge and uncovertebral disease. 4. 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   Narrative & Impression  CLINICAL DATA:  Initial evaluation for acute myelopathy.   EXAM: MRI LUMBAR SPINE WITHOUT CONTRAST   TECHNIQUE: Multiplanar, multisequence MR imaging of the lumbar spine was performed. No intravenous contrast was administered.   COMPARISON:  None Available.   FINDINGS: Segmentation:  Standard.   Alignment:  Physiologic.   Vertebrae: Vertebral body height maintained without acute or chronic fracture. Bone marrow signal intensity diffusely heterogeneous. No worrisome osseous lesions or abnormal marrow edema.   Conus medullaris and cauda equina: Conus extends to the T12 level. Conus and cauda equina appear normal.   Paraspinal and other soft tissues: Paraspinous soft tissues within normal limits. Few scattered T2 hyperintense cyst noted about the kidneys, largest of which measures 2 cm on the left. These are benign in appearance, with no follow-up imaging recommended. Innumerable cystic lesions noted within the partially visualized liver.   Disc levels:   L1-2: Disc desiccation with minimal annular bulge. Mild facet hypertrophy. No stenosis.   L2-3: Degenerative intervertebral disc space narrowing with circumferential disc bulge and disc desiccation. Mild reactive endplate spurring. Mild bilateral facet hypertrophy. No significant spinal stenosis. Foramina remain patent.   L3-4: Disc desiccation with mild diffuse disc bulge. Mild reactive endplate spurring. Mild facet and ligament flavum hypertrophy. Resultant mild-to-moderate spinal stenosis. Mild bilateral L3 foraminal narrowing.   L4-5: Disc desiccation with diffuse disc bulge. Superimposed small right subarticular disc protrusion  with slight inferior migration and annular fissure. Protruding disc contacts the descending right L5 nerve root in the right lateral recess (series 12, image 31). Moderate bilateral facet arthrosis. Resultant moderate canal with bilateral subarticular stenosis. Mild bilateral L4 foraminal narrowing.   L5-S1:  Negative interspace.  Mild facet hypertrophy.  No stenosis.   IMPRESSION: 1. Disc bulge with small right subarticular disc protrusion at L4-5, potentially affecting the descending right L5 nerve root. Moderate canal with mild bilateral L4 foraminal stenosis at this level. 2. Disc bulge with facet hypertrophy at L3-4 with resultant mild to moderate spinal stenosis, with mild bilateral L3 foraminal narrowing. 3. Innumerable cystic lesions within the visualized liver. While these could reflect benign cysts, these are incompletely assessed on this exam. Correlation with dedicated abdominal MRI recommended for further evaluation.     Electronically Signed   By: Rise Mu M.D.   On: 12/14/2022 22:30    I have personally reviewed the images and agree with the above interpretation.  Labs:    Latest Ref Rng & Units 12/15/2022    2:22 AM 12/14/2022   10:49 AM 06/17/2020    6:31 AM  CBC  WBC 4.0 - 10.5 K/uL 7.2  9.1  4.7   Hemoglobin 12.0 - 15.0 g/dL 96.0  45.4  7.8   Hematocrit 36.0 - 46.0 % 34.5  35.7  23.9   Platelets 150 - 400 K/uL 310  319  314        Assessment and Plan: Stephanie [name] is a pleasant 67 y.o. female with presentation of lower extremity weakness.  She had a lumbar MRI which showed mild spondylosis but no significant compression.  Evaluation by our neurology colleagues discovered some hyperreflexia so cervical MRI was ordered.  On her physical examination she does show diffuse weakness in the bilateral upper and lower extremities as well as hyperreflexia, however her right lower extremity is most severely impacted.  This appears to be worse in hip  flexion compared to other myotomes.  Her MRI demonstrated some lumbar spondylosis with L4-5 disc, however this would not explain the predominant proximal weakness.  Nor would it explain the hyperreflexia.  She obtained a cervical MRI which did not show any significant stenosis.  Given that her sensory level is somewhere around the umbilicus, we do feel that she might benefit from thoracic imaging.  Will discuss with neurology.  Will continue to follow, at this point we do not see any compressive lesions that would be causative of her deficit.   Lovenia Kim, MD/MSCR Dept. of Neurosurgery     Addendum: MRI T-spine obtained with no evidence of compressive etiology.  At this point no clear compressive etiology for patient's upper motor neuron pattern weakness.

## 2022-12-16 NOTE — Progress Notes (Signed)
Progress Note   Patient: Stephanie [name] UJW:[id number] DOB: 1955-05-19 DOA: 12/14/2022     0 DOS: the patient was seen and examined on 12/16/2022   Brief hospital course: Stephanie [name] is a 67 y.o. female with medical history significant of hypertension, tobacco use, hyperlipidemia, CVA, type 2 diabetes, who presents to the ED due to right leg weakness.   [name] states that approximately 3 weeks ago, she had rapid onset right lower extremity weakness.  She feels that her thigh and upper calf are more affected than her feet.  Her symptoms have gradually worsened and led to multiple falls at home.  She denies any loss of consciousness or injuries though.  She denies any focal weakness in any other limb.  She denies any dizziness, vision loss, headache, dysphagia, dysarthria, facial droop.  Assessment and Plan: * Right leg weakness Right lower extremity weakness that began 3 weeks ago.  CT of the head and MRI brain negative for any acute infarct, however moderate to severe chronic small vessel disease.   MRI L spine reviewed with neurosurgery - not significant to cause symptoms. Neurology evaluation appreciated. Advised MRI C spine given all 4 limbs weak with brisk reflexes. Neurosurgery advised to get MRI thoracic spine due to the level of sensory deficits. PT advised rehab placement.  Elevated LFTs RUQ sono shows cirrhosis, 1.2 cm hypoechoic lesion. MRI lumbar spine shows benign lesions of liver though not fully visualized. Hepatitis panel negative. PT/INR wnl. Trend LFT Hold home atorvastatin.  COPD with acute exacerbation (HCC) Continuous pulse oximetry Supplemental oxygen to maintain oxygen saturation above 88% Continue prednisone 40 mg to complete a 5-day course DuoNebs every 6 hours  Acute kidney injury (HCC) Kidney function improved with IV fluids. Avoid nephrotoxic agents. Hold Lasix. Encourage oral diet.  Prediabetes A1c ordered to prior labs. Diet, exercise and  weight reduction advised.  Primary hypertension Continue Amlodipine.  CVA (cerebral vascular accident) (HCC) Lipid panel pending Continue Aspirin 81 mg daily. Statin held due to abnormal LFT.   Supportive care. DVT prophylaxis Lovenox. CODE STATUS DNR.      Subjective: Patient is seen and examined today morning.  She is sleeping, says more weak after the MRI.  Advised to work with PT, out of bed.  Physical Exam: Vitals:   12/16/22 0315 12/16/22 0533 12/16/22 0814 12/16/22 1539  BP: (!) 144/64 (!) 146/79 136/81 122/64  Pulse: 96 93 (!) 110 (!) 107  Resp: 18  18 18   Temp: 98 F (36.7 C)  98.2 F (36.8 C) (!) 97.5 F (36.4 C)  TempSrc: Oral     SpO2: 92%  93% 94%  Weight:      Height:       General - Elderly African-American female, no apparent distress HEENT - PERRLA, EOMI, atraumatic head, non tender sinuses. Lung - Clear, rales, rhonchi, wheezes. Heart - S1, S2 heard, no murmurs, rubs, trace pedal edema Neuro - Alert, awake and oriented,  Skin - Warm and dry. Data Reviewed:     Latest Ref Rng & Units 12/15/2022    2:22 AM 12/14/2022   10:49 AM 06/17/2020    6:31 AM  CBC  WBC 4.0 - 10.5 K/uL 7.2  9.1  4.7   Hemoglobin 12.0 - 15.0 g/dL 56.2  13.0  7.8   Hematocrit 36.0 - 46.0 % 34.5  35.7  23.9   Platelets 150 - 400 K/uL 310  319  314        Latest Ref Rng &  Units 12/15/2022    2:22 AM 12/14/2022   10:49 AM 06/17/2020    6:31 AM  BMP  Glucose 70 - 99 mg/dL 161  096  97   BUN 8 - 23 mg/dL 26  29  14    Creatinine 0.44 - 1.00 mg/dL 0.45  4.09  8.11   Sodium 135 - 145 mmol/L 135  136  140   Potassium 3.5 - 5.1 mmol/L 3.7  3.4  3.8   Chloride 98 - 111 mmol/L 105  100  106   CO2 22 - 32 mmol/L 20  21  27    Calcium 8.9 - 10.3 mg/dL 91.4  78.2  95.6    MR CERVICAL SPINE W WO CONTRAST  Result Date: 12/16/2022 CLINICAL DATA:  Initial evaluation for weakness, hyper reflexia. EXAM: MRI CERVICAL SPINE WITHOUT AND WITH CONTRAST TECHNIQUE: Multiplanar and multiecho pulse  sequences of the cervical spine, to include the craniocervical junction and cervicothoracic junction, were obtained without and with intravenous contrast. CONTRAST:  7mL GADAVIST GADOBUTROL 1 MMOL/ML IV SOLN COMPARISON:  None Available. FINDINGS: Alignment: Examination mildly degraded by motion. Straightening of the normal cervical lordosis. No significant listhesis. Vertebrae: Vertebral body height maintained without acute or chronic fracture. Bone marrow signal intensity diffusely heterogeneous. Subcentimeter stir hyperintense lesions involving the T2 and T3 vertebral bodies noted, favored to reflect small atypical hemangiomata noted. No worrisome osseous lesions. No other abnormal marrow edema or enhancement. Cord: Normal signal and morphology. No convincing cord signal changes seen on this motion degraded exam. No abnormal enhancement. Posterior Fossa, vertebral arteries, paraspinal tissues: Patient's known cavernous ICA aneurysm noted, partially visualized. Craniocervical junction within normal limits. Paraspinous soft tissues within normal limits. Normal flow voids seen within the vertebral arteries bilaterally. Disc levels: C2-C3: Mild disc bulge. No spinal stenosis. Foramina remain patent. C3-C4: Mild disc bulge with uncovertebral spurring. Flattening and partial effacement of the ventral thecal sac with resultant mild spinal stenosis. Mild left C4 foraminal narrowing. Right neural foramen remains patent. C4-C5: Mild diffuse disc bulge with uncovertebral spurring. Flattening and partial effacement of the ventral thecal sac. Mild cord flattening without cord signal changes. Mild spinal stenosis. Foramina remain patent. C5-C6: Mild disc bulge with uncovertebral spurring. Flattening and partial effacement of the ventral thecal sac with resultant mild spinal stenosis. Foramina remain patent. C6-C7: Degenerative intervertebral disc space narrowing with diffuse disc osteophyte complex. Posterior component flattens  and partially effaces the ventral thecal sac. No more than mild spinal stenosis. Mild left C7 foraminal narrowing. Right neural foramen remains patent. C7-T1: Mild degenerative disc space narrowing with diffuse disc bulge and uncovertebral spurring. Mild bilateral facet hypertrophy. Resultant mild spinal stenosis. Mild left C8 foraminal narrowing. Right neural foramen remains patent. IMPRESSION: 1. Normal MRI appearance of the cervical spinal cord. No cord signal changes to suggest myelopathy or other abnormality. 2. Multilevel cervical spondylosis with resultant mild diffuse spinal stenosis at C3-4 through C7-T1. 3. Mild left C4, C7, and C8 foraminal stenosis related to disc bulge and uncovertebral disease. 4. 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 Electronically Signed   By: Rise Mu M.D.   On: 12/16/2022 06:21    Family Communication: Patient and her sister at bedside updated regarding current care plan.  Disposition: Status is: Observation The patient remains OBS appropriate and will d/c before 2 midnights.  Planned Discharge Destination: Skilled nursing facility    Time spent: 43 minutes  Author: Marcelino Duster, MD 12/16/2022 5:17 PM  For  on call review [email or web address].

## 2022-12-17 ENCOUNTER — Encounter: Payer: Self-pay | Admitting: Radiology

## 2022-12-17 ENCOUNTER — Inpatient Hospital Stay: Payer: 59

## 2022-12-17 ENCOUNTER — Inpatient Hospital Stay
Admit: 2022-12-17 | Discharge: 2022-12-17 | Disposition: A | Discharge status: Home / Self Care | Payer: 59 | Attending: Internal Medicine | Admitting: Internal Medicine

## 2022-12-17 DIAGNOSIS — R29898 Other symptoms and signs involving the musculoskeletal system: Secondary | ICD-10-CM | POA: Diagnosis not present

## 2022-12-17 DIAGNOSIS — R531 Weakness: Secondary | ICD-10-CM | POA: Diagnosis not present

## 2022-12-17 DIAGNOSIS — R0609 Other forms of dyspnea: Secondary | ICD-10-CM | POA: Diagnosis not present

## 2022-12-17 DIAGNOSIS — E860 Dehydration: Secondary | ICD-10-CM | POA: Diagnosis not present

## 2022-12-17 DIAGNOSIS — J441 Chronic obstructive pulmonary disease with (acute) exacerbation: Secondary | ICD-10-CM | POA: Diagnosis not present

## 2022-12-17 LAB — ECHOCARDIOGRAM COMPLETE
AR max vel: 3.41 cm2
AV Area VTI: 3.68 cm2
AV Area mean vel: 3.07 cm2
AV Mean grad: 9 mmHg
AV Peak grad: 16 mmHg
Ao pk vel: 2 m/s
Area-P 1/2: 6.32 cm2
Height: 72 in
S' Lateral: 2.8 cm
Weight: 2610.25 oz

## 2022-12-17 LAB — BRAIN NATRIURETIC PEPTIDE: B Natriuretic Peptide: 91.9 pg/mL (ref 0.0–100.0)

## 2022-12-17 MED ORDER — DM-GUAIFENESIN ER 30-600 MG PO TB12
1.0000 | ORAL_TABLET | Freq: Two times a day (BID) | ORAL | Status: DC
  Administered 2022-12-17 – 2022-12-19 (×5): 1 via ORAL
  Filled 2022-12-17 (×5): qty 1

## 2022-12-17 MED ORDER — GADOBUTROL 1 MMOL/ML IV SOLN
7.0000 mL | Freq: Once | INTRAVENOUS | Status: AC | PRN
  Administered 2022-12-17: 7 mL via INTRAVENOUS

## 2022-12-17 MED ORDER — FUROSEMIDE 10 MG/ML IJ SOLN
40.0000 mg | Freq: Once | INTRAMUSCULAR | Status: AC
  Administered 2022-12-17: 40 mg via INTRAVENOUS
  Filled 2022-12-17: qty 4

## 2022-12-17 MED ORDER — IPRATROPIUM-ALBUTEROL 0.5-2.5 (3) MG/3ML IN SOLN: 3.0000 mL | RESPIRATORY_TRACT | Status: DC | PRN

## 2022-12-17 NOTE — Progress Notes (Signed)
*  PRELIMINARY RESULTS* Echocardiogram 2D Echocardiogram has been performed.  [name] 12/17/2022, 1:57 PM

## 2022-12-17 NOTE — Progress Notes (Signed)
Progress Note   Patient: Stephanie [name] ZOX:[id number] DOB: 1955/12/31 DOA: 12/14/2022     1 DOS: the patient was seen and examined on 12/17/2022   Brief hospital course: Stephanie [name] is a 67 y.o. female with medical history significant of hypertension, tobacco use, hyperlipidemia, CVA, type 2 diabetes, who presents to the ED due to right leg weakness started approximately 3 weeks ago, she had rapid onset right lower extremity weakness.  She feels that her thigh and upper calf are more affected than her feet.  Her symptoms have gradually worsened and led to multiple falls at home.  She denies any loss of consciousness or injuries though.  She denies any focal weakness in any other limb.  She denies any dizziness, vision loss, headache, dysphagia, dysarthria, facial droop.  Patient is admitted to Hospitalist service for further management, imaging rule out stroke. Neurology, Neurosurgery teams consulted for abnormal MRI spine findings. MRI L spine findings does not correlate with her weakness. She does have generalized weakness. Today she did complain of shortness of breath, weakness, cough. Tachycardia, tachypnea noted. Chest Xray, Echo, EKG ordered. Duonebs, Lasix, Mucinex ordered.  Assessment and Plan: * Right leg weakness Right lower extremity weakness that began 3 weeks ago.  CT of the head and MRI brain negative for any acute infarct, however moderate to severe chronic small vessel disease.   MRI L spine reviewed with neurosurgery - not significant to cause symptoms. Neurology evaluation appreciated. Advised MRI C spine given all 4 limbs weak with brisk reflexes. MRI C spine reviewed by neurosurgery advised to get MRI thoracic spine which did not reveal any acute abnormalities. Discussed with neurology team - diffuse weakness likely due to deconditioning. PT advised rehab placement.  Elevated LFTs Cystic liver lesions. RUQ sono shows cirrhosis, 1.2 cm hypoechoic lesion. MRI lumbar spine  shows benign cystic lesions of liver though not fully visualized. Hepatitis panel negative. PT/INR wnl. GI evaluation called advised MRI abdomen. Follow recommendations. Hold home atorvastatin.  COPD with acute exacerbation (HCC) She does have SOB, cough. Chest xray shows no airspace disease. Echo reviewed is unremarkable. Trial of Lasix given. Continuous pulse oximetry. Supplemental oxygen to maintain oxygen saturation above 88%. Continue prednisone 40 mg to complete a 5-day course DuoNebs every 6 hours. Mucinex Q12 ordered.  Acute kidney injury (HCC) Kidney function improved with IV fluids. Avoid nephrotoxic agents.  Encourage oral diet.  Prediabetes A1c ordered to prior labs. Diet, exercise and weight reduction advised.  Primary hypertension Continue Amlodipine.  CVA (cerebral vascular accident) (HCC) Continue Aspirin 81 mg daily. Statin held due to abnormal LFT.  Supportive care. DVT prophylaxis Lovenox. CODE STATUS DNR. If patient remains stable can go to SNF over weekend per TOC.     Subjective: Patient is seen and examined today morning.  She feels short of breath, has cough. Attributes symptoms after MRI.  Feels weak, did not eat well.  Encouraged out of bed, spirometry.  Physical Exam: Vitals:   12/16/22 2200 12/17/22 0617 12/17/22 0738 12/17/22 1108  BP: 122/76 133/67 120/64   Pulse: 94 (!) 102 (!) 106 (!) 108  Resp: 17 18 18    Temp: 98.4 F (36.9 C) 98.3 F (36.8 C) 98.2 F (36.8 C)   TempSrc: Oral Oral Oral   SpO2: 95% 97% 93%   Weight:      Height:       General - Elderly African-American female, mild respiratory distress HEENT - PERRLA, EOMI, atraumatic head, non tender sinuses. Lung - Clear,  diffuse rhonchi, wheezes, tachypnea, using accessory muscles. Heart - S1, S2 heard, tachycardia, trace pedal edema Neuro - Alert, awake and oriented, right leg weakness.  Skin - Warm and dry. Data Reviewed:     Latest Ref Rng & Units 12/15/2022     2:22 AM 12/14/2022   10:49 AM 06/17/2020    6:31 AM  CBC  WBC 4.0 - 10.5 K/uL 7.2  9.1  4.7   Hemoglobin 12.0 - 15.0 g/dL 40.9  81.1  7.8   Hematocrit 36.0 - 46.0 % 34.5  35.7  23.9   Platelets 150 - 400 K/uL 310  319  314        Latest Ref Rng & Units 12/17/2022    3:25 AM 12/15/2022    2:22 AM 12/14/2022   10:49 AM  BMP  Glucose 70 - 99 mg/dL 914  782  956   BUN 8 - 23 mg/dL 28  26  29    Creatinine 0.44 - 1.00 mg/dL 2.13  0.86  5.78   Sodium 135 - 145 mmol/L 134  135  136   Potassium 3.5 - 5.1 mmol/L 3.8  3.7  3.4   Chloride 98 - 111 mmol/L 103  105  100   CO2 22 - 32 mmol/L 24  20  21    Calcium 8.9 - 10.3 mg/dL 46.9  62.9  52.8    DG Chest 1 View  Result Date: 12/17/2022 CLINICAL DATA:  Shortness of breath. EXAM: CHEST  1 VIEW COMPARISON:  12/14/2022 FINDINGS: Single-view of the chest demonstrates slightly low lung volumes. No focal airspace disease or consolidation. No overt pulmonary edema. Heart and mediastinum are grossly stable. Patient appears to be mildly rotated on this examination. No acute bone abnormality. IMPRESSION: No active disease. Electronically Signed   By: Richarda Overlie M.D.   On: 12/17/2022 13:28   MR THORACIC SPINE W WO CONTRAST  Result Date: 12/17/2022 CLINICAL DATA:  Initial evaluation for right lower extremity weakness. EXAM: MRI THORACIC WITHOUT AND WITH CONTRAST TECHNIQUE: Multiplanar and multiecho pulse sequences of the thoracic spine were obtained without and with intravenous contrast. CONTRAST:  7mL GADAVIST GADOBUTROL 1 MMOL/ML IV SOLN COMPARISON:  None Available. FINDINGS: Alignment: Trace dextroscoliosis with mild straightening of the normal midthoracic kyphosis. No listhesis. Vertebrae: Vertebral body height maintained without acute or chronic fracture. Bone marrow signal intensity markedly heterogeneous. Few small STIR hyperintense lesions involving the teeth to through T4 vertebral bodies noted, favored to reflect atypical hemangiomata. No other worrisome  osseous lesions. No other abnormal marrow edema or enhancement. Cord:  Normal signal and morphology.  No abnormal enhancement. Paraspinal and other soft tissues: Paraspinous soft tissues are within normal limits. Trace layering bilateral pleural effusions noted. Innumerable cystic lesions noted within the partially visualized liver, incompletely assessed on this exam. Disc levels: Ordinary for age multilevel disc desiccation seen throughout the thoracic spine. No significant disc bulge or focal disc herniation. Posterior element hypertrophy noted at T6-7. No significant spinal stenosis. Foramina remain patent. No neural impingement. IMPRESSION: 1. Normal MRI appearance of the thoracic spinal cord. No findings to explain patient's symptoms identified. 2. Minor thoracic spondylosis for age. No significant stenosis or neural impingement. 3. Innumerable cystic lesions within the partially visualized liver, incompletely assessed on this exam. Again, further evaluation with dedicated MRI of the abdomen, with and without contrast, recommended for further evaluation. Electronically Signed   By: Rise Mu M.D.   On: 12/17/2022 04:40    Family Communication: Patient and her sister  at bedside updated regarding current care plan.  Disposition: Status is: Observation The patient will require care spanning > 2 midnights and should be moved to inpatient because: She has persistent right leg weakness need neurology, neurosurgery eval; abnormal liver function need GI work up, has copd flare.  Planned Discharge Destination: Skilled nursing facility    Time spent: 43 minutes  Author: Marcelino Duster, MD 12/17/2022 2:23 PM  For on call review [email or web address].

## 2022-12-17 NOTE — Plan of Care (Signed)
  Problem: Nutrition: Goal: Adequate nutrition will be maintained Outcome: Progressing   Problem: Coping: Goal: Level of anxiety will decrease Outcome: Progressing   

## 2022-12-17 NOTE — Progress Notes (Signed)
Patient called me into the room to say she was feeling short of breath. Heart rate 127. MD came into the room while this was happening and ordered an EKG and chest x ray

## 2022-12-17 NOTE — Plan of Care (Addendum)
Patient will be having another MRI after midnight. Ambulating with assistance to the bathroom

## 2022-12-17 NOTE — Consult Note (Signed)
GI Inpatient Consult Note  Reason for Consult: New diagnosis of cirrhosis with liver lesion    Attending Requesting Consult: Dr. Suzanne Boron, MD  History of Present Illness: Stephanie [name] is a 67 y.o. female seen for evaluation of new diagnosis of cirrhosis with liver cysts at the request of hospitalist - Dr. Suzanne Boron. Patient has a PMH of HTN, HLD, T2DM, COPD, hx of CVA, and tobacco abuse. She presented to the [facility] ED 7/30 for chief complaint of right leg weakness which started 3 weeks prior to admission. CT head and MRI brain negative for any acute infarct, but did comment on severe, chronic small vessel disease. Neurosurgery is following. MRI cervical, lumbar, and thoracic spine negative to explain her symptoms. RUQ [place] was ordered for work-up of elevated LFTs and RUQ abdominal pain which commented on cirrhotic morphology and 1.2 x 1.0 x 1.2 cm hypoechoic lesion. Dedicated MRI abdomen w/wo contrast has been recommended to further assess the liver lesion. GI consulted for further evaluation and management.   Patient seen and examined this afternoon resting in bed. No acute events overnight. She is currently getting her echocardiogram. She denies any known hx of cirrhosis prior to admission. She denies any known family history of chronic liver disease. She denies any acute liver-related symptoms such as jaundice, pruritus, abdominal swelling, lower extremity edema, altered mental status, hematochezia, melena, nausea, or vomiting. She denies any hx of EtOH abuse. She is not drinking EtOH socially. She denies any hx of IVDU, previous blood transfusions prior to 1992, [employer] experience, prior incarcerations, etc. She denies any herbal supplements. Labs today showed mixed pattern elevated LFTs - AST 148, ALT 48, alk phos 211, and total bilirubin 1.2. She does have hx of CVA 2022 and is currently on chronic antiplatelet therapy with Clopidogrel 75 mg daily.   Summary of GI  Procedures:  CSY 06/10/2020 - poor colon prep, two 8-10 mm polyps removed from sigmoid colon with path showing TVA and TA, multiple ulcers found in rectum without bleeding with biopsies showing moderate active proctitis    Past Medical History:  Past Medical History:  Diagnosis Date   Diabetes mellitus without complication (HCC)    Hypertension    Stroke (HCC) 05/17/2020    Problem List: Patient Active Problem List   Diagnosis Date Noted   Right leg weakness 12/14/2022   Elevated LFTs 12/14/2022   COPD with acute exacerbation (HCC) 12/14/2022   Prediabetes 12/14/2022   Severe anemia 06/05/2020   Acute kidney injury (HCC) 06/02/2020   Intractable vomiting with nausea 06/01/2020   AKI (acute kidney injury) (HCC) 06/01/2020   Gastroenteritis due to COVID-19 virus 06/01/2020   Weakness    Primary hypertension    Acute respiratory failure with hypoxia (HCC) 05/19/2020   COVID-19 05/18/2020   Dehydration 05/18/2020   Slurred speech 05/18/2020   CVA (cerebral vascular accident) (HCC) 05/18/2020    Past Surgical History: Past Surgical History:  Procedure Laterality Date   ABDOMINAL HYSTERECTOMY     at 67 years old   CLOSED REDUCTION MANDIBLE Bilateral 05/23/2020   Procedure: CLOSED REDUCTION MANDIBULAR;  Surgeon: Vernie Murders, MD;  Location: [facility] OR[facility];  Service: ENT;  Laterality: Bilateral;   CLOSED REDUCTION MANDIBLE N/A 06/03/2020   Procedure: CLOSED REDUCTION MANDIBULAR;  Surgeon: Linus Salmons, MD;  Location: [facility] OR[facility];  Service: ENT;  Laterality: N/A;   COLONOSCOPY WITH PROPOFOL N/A 06/10/2020   Procedure: COLONOSCOPY WITH PROPOFOL;  Surgeon: Pasty Spillers, MD;  Location: [facility];  Service: Endoscopy;  Laterality: N/A;    Allergies: Allergies  Allergen Reactions   Codeine Other (See Comments)    hallucinations   Lisinopril Swelling    Home Medications: Medications Prior to Admission  Medication Sig Dispense Refill Last Dose   amLODipine (NORVASC) 10  MG tablet TAKE ONE TABLET BY MOUTH ONCE EVERY DAY FOR HIGH BLOOD PRESSURE 90 tablet 2 Past Week   atorvastatin (LIPITOR) 40 MG tablet TAKE ONE TABLET BY MOUTH ONCE EVERY NIGHT FOR HIGH CHOLESTEROL 90 tablet 2 Past Week   hydrALAZINE (APRESOLINE) 50 MG tablet TAKE ONE TABLET BY MOUTH 3 TIMES A DAY 270 tablet 2 Past Week   pantoprazole (PROTONIX) 40 MG tablet TAKE ONE TABLET BY MOUTH 2 TIMES A DAY 180 tablet 0 Past Week   clopidogrel (PLAVIX) 75 MG tablet Take 1 tablet (75 mg total) by mouth daily. Hold this medication until you see your PCP. (Patient not taking: Reported on 02/24/2021) 30 tablet 2 Completed Course   clopidogrel (PLAVIX) 75 MG tablet TAKE ONE TABLET BY MOUTH ONCE EVERY DAY. (Patient not taking: Reported on 12/14/2022) 90 tablet 3 Completed Course   hydrALAZINE (APRESOLINE) 25 MG tablet Take 1 tablet (25 mg total) by mouth 2 (two) times daily. (Patient not taking: Reported on 02/24/2021) 60 tablet 2 Completed Course   Ipratropium-Albuterol (COMBIVENT) 20-100 MCG/ACT AERS respimat Inhale 1 puff into the lungs every 6 (six) hours. (Patient not taking: Reported on 02/24/2021) 1 each 0 Completed Course   pantoprazole (PROTONIX) 40 MG tablet Take 1 tablet (40 mg total) by mouth 2 (two) times daily. (Patient not taking: Reported on 12/14/2022) 60 tablet 0 Completed Course   Home medication reconciliation was completed with the patient.   Scheduled Inpatient Medications:    amLODipine  10 mg Oral Daily   enoxaparin (LOVENOX) injection  40 mg Subcutaneous Q24H   hydrALAZINE  50 mg Oral Q8H   multivitamin with minerals  1 tablet Oral Daily   pantoprazole  20 mg Oral BID   predniSONE  40 mg Oral Q breakfast   sodium chloride flush  3 mL Intravenous Q12H    PRN Inpatient Medications:  acetaminophen **OR** acetaminophen, ipratropium-albuterol, ondansetron **OR** ondansetron (ZOFRAN) IV, polyethylene glycol  Family History: family history includes COPD in her mother; Hypertension in her  maternal grandmother and mother; Stroke in her maternal grandmother.  The patient's family history is negative for inflammatory bowel disorders, GI malignancy, or solid organ transplantation.  Social History:   reports that she quit smoking about 2 years ago. Her smoking use included cigarettes. She has never used smokeless tobacco. She reports that she does not drink alcohol and does not use drugs. The patient denies ETOH, tobacco, or drug use.   Review of Systems: Constitutional: Weight is stable.  Eyes: No changes in vision. ENT: No oral lesions, sore throat.  GI: see HPI.  Heme/Lymph: No easy bruising.  CV: No chest pain.  GU: No hematuria.  Integumentary: No rashes.  Neuro: No headaches.  Psych: No depression/anxiety.  Endocrine: No heat/cold intolerance.  Allergic/Immunologic: No urticaria.  Resp: No cough, SOB.  Musculoskeletal: No joint swelling.    Physical Examination: BP 120/64 (BP Location: Right Arm)   Pulse (!) 108   Temp 98.2 F (36.8 C) (Oral)   Resp 18   Ht 6' (1.829 m)   Wt 74 kg   SpO2 93%   BMI 22.13 kg/m  Gen: NAD, alert and oriented x 4 HEENT: PEERLA, EOMI, Neck: supple, no JVD or  thyromegaly Chest: CTA bilaterally, no wheezes, crackles, or other adventitious sounds CV: RRR, no m/g/c/r Abd: soft, NT, ND, +BS in all four quadrants; no HSM, guarding, ridigity, or rebound tenderness Ext: no edema, well perfused with 2+ pulses, Skin: no rash or lesions noted Lymph: no LAD  Data: Lab Results  Component Value Date   WBC 7.2 12/15/2022   HGB 11.3 (L) 12/15/2022   HCT 34.5 (L) 12/15/2022   MCV 82.7 12/15/2022   PLT 310 12/15/2022   Recent Labs  Lab 12/14/22 1049 12/15/22 0222  HGB 11.7* 11.3*   Lab Results  Component Value Date   NA 134 (L) 12/17/2022   K 3.8 12/17/2022   CL 103 12/17/2022   CO2 24 12/17/2022   BUN 28 (H) 12/17/2022   CREATININE 1.29 (H) 12/17/2022   Lab Results  Component Value Date   ALT 48 (H) 12/17/2022   AST 148  (H) 12/17/2022   ALKPHOS 211 (H) 12/17/2022   BILITOT 1.2 12/17/2022   Recent Labs  Lab 12/15/22 0222  INR 1.1   MRI Thoracic Spine w/wo contrast 12/17/2022: IMPRESSION: 1. Normal MRI appearance of the thoracic spinal cord. No findings to explain patient's symptoms identified. 2. Minor thoracic spondylosis for age. No significant stenosis or neural impingement. 3. Innumerable cystic lesions within the partially visualized liver, incompletely assessed on this exam. Again, further evaluation with dedicated MRI of the abdomen, with and without contrast, recommended for further evaluation.  US Liver 12/14/2022: IMPRESSION: 1. Hepatic cirrhosis. 2. 1.2 cm x 1.0 cm x 1.2 cm hypoechoic liver lesion, as described above. MRI correlation is recommended to further exclude the presence of an underlying neoplastic process.  Assessment/Plan:  67 y/o AA female with a PMH of HTN, HLD, T2DM, COPD, hx of CVA, and tobacco abuse presented to the [facility] ED 7/30 for chief complaint of right lower extremity weakness of uncertain etiology. She was found to have radiographic evidence of cirrhosis with elevated LFTs and right liver lesion of uncertain etiology. GI consulted for further evaluation and management.   Cryptogenic cirrhosis of the liver, compensated   Right liver lesion - 1.2 cm x 1.0 cm x 1.2 cm lesion in right lobe of liver   Elevated LFTs - mixed pattern  Right lower extremity weakness  COPD with acute exacerbation   AKI  Hx of CVA on chronic antiplatelet therapy  T2DM  DNR status   Recommendations:  - Continue supportive care per primary team - Recommend MRI abdomen w/wo contrast for further assessment of liver morphology and characterization of right lobe liver lesion. MRI scheduled for midnight tonight. Follow-up on results in the morning.  - Elevated LFTs of unknown etiology. Will order other hepatic serologies to rule out other intrinsic causes of chronic liver disease (AIH,  HH, Wilson's disease, etc).  - Continue to trend LFTs - Repeat CBC, CMP, INR daily - Avoid NSAIDs. Avoid hepatotoxins.  - Carb modified diet OK for now - GI following along with you   Thank you for the consult. Please call with questions or concerns.  Gilda Crease, PA-C [facility] [phone]

## 2022-12-17 NOTE — Progress Notes (Signed)
Physical Therapy Treatment Patient Details Name: Stephanie [name] MRN: [id number] DOB: 02-16-1956 Today's Date: 12/17/2022   History of Present Illness Stephanie [name] is a 67 y.o. female with medical history significant of hypertension, tobacco use, hyperlipidemia, CVA, type 2 diabetes, who presents to the ED due to right leg weakness. 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 Right upper quadrant ultrasound demonstrating nodular parenchyma with approximately 1 cm liver lesion.    PT Comments  Pt received supine in bed agreeable to PT. Pt is mod-I with bed mobility. HR remains elevated prior to mobility at 119 BPM. Requires min to modA to attempt STS from lowered surface unsuccessfully but able to stand minguard with bed elevated requiring VC's for hand placement. Pt able to ambulate a total of ~65' with RW minguard requiring regular VC's for obstacle navigation. Minguard for stand to sit and STS with independent toileting and hand hygiene. Pt returning to upright in bed with all needs in reach. Max HR up to 143 BPM post gait but returns to baseline 119-120 BPM with seated rest. SPO2 WNL. Pt remains needing physical assist from lowered surfaces and cues for safe use of RW to reduce falls risk with transfers and gait. D/c recs remain appropriate at this time.    If plan is discharge home, recommend the following: Assistance with cooking/housework;Direct supervision/assist for financial management;Assist for transportation;Help with stairs or ramp for entrance;Direct supervision/assist for medications management;A little help with walking and/or transfers;A lot of help with bathing/dressing/bathroom   Can travel by private vehicle     No  Equipment Recommendations  Rolling walker (2 wheels)    Recommendations for Other Services       Precautions / Restrictions Precautions Precautions:  Fall Precaution Comments: Watch HR Restrictions Weight Bearing Restrictions: No     Mobility  Bed Mobility Overal bed mobility: Modified Independent Bed Mobility: Supine to Sit, Sit to Supine             Patient Response: Cooperative  Transfers Overall transfer level: Needs assistance Equipment used: Rolling walker (2 wheels) Transfers: Sit to/from Stand Sit to Stand: Min guard, Min assist, From elevated surface           General transfer comment: minguard from mildly elevated surface. MinA from low surface    Ambulation/Gait Ambulation/Gait assistance: Supervision Gait Distance (Feet): 65 Feet Assistive device: Rolling walker (2 wheels) Gait Pattern/deviations: Step-through pattern, Decreased step length - right, Decreased step length - left           Stairs             Wheelchair Mobility     Tilt Bed Tilt Bed Patient Response: Cooperative  Modified Rankin (Stroke Patients Only)       Balance Overall balance assessment: Needs assistance Sitting-balance support: No upper extremity supported, Feet unsupported Sitting balance-Leahy Scale: Good     Standing balance support: Reliant on assistive device for balance, Bilateral upper extremity supported, During functional activity Standing balance-Leahy Scale: Fair Standing balance comment: relies on RW for balance                            Cognition Arousal/Alertness: Awake/alert Behavior During Therapy: WFL for tasks assessed/performed Overall Cognitive Status: Within Functional Limits for tasks assessed  Exercises      General Comments General comments (skin integrity, edema, etc.): Resting HR: 119-120 BPM. Max HR up to 143 BPM post gait. Returned to 119 BPM with seated rest. SPO2 >/= 98% throughout.      Pertinent Vitals/Pain Pain Assessment Pain Assessment: Faces Faces Pain Scale: No hurt    Home Living                           Prior Function            PT Goals (current goals can now be found in the care plan section) Acute Rehab PT Goals Patient Stated Goal: return home Time For Goal Achievement: 12/29/22 Potential to Achieve Goals: Fair Progress towards PT goals: Progressing toward goals    Frequency    Min 1X/week      PT Plan Current plan remains appropriate    Co-evaluation              AM-PAC PT "6 Clicks" Mobility   Outcome Measure  Help needed turning from your back to your side while in a flat bed without using bedrails?: A Little Help needed moving from lying on your back to sitting on the side of a flat bed without using bedrails?: A Little Help needed moving to and from a bed to a chair (including a wheelchair)?: A Lot Help needed standing up from a chair using your arms (e.g., wheelchair or bedside chair)?: A Lot Help needed to walk in hospital room?: A Little Help needed climbing 3-5 steps with a railing? : A Lot 6 Click Score: 15    End of Session Equipment Utilized During Treatment: Gait belt Activity Tolerance: Patient limited by fatigue;Patient tolerated treatment well Patient left: in bed;with call bell/phone within reach;with bed alarm set Nurse Communication: Mobility status PT Visit Diagnosis: Other abnormalities of gait and mobility (R26.89);Difficulty in walking, not elsewhere classified (R26.2);History of falling (Z91.81);Muscle weakness (generalized) (M62.81)     Time: 1324-4010 PT Time Calculation (min) (ACUTE ONLY): 15 min  Charges:    $Therapeutic Activity: 8-22 mins PT General Charges $$ ACUTE PT VISIT: 1 Visit                     Delphia Grates. Fairly IV, PT, DPT Physical Therapist- [place]  [facility]  12/17/2022, 2:12 PM

## 2022-12-18 ENCOUNTER — Inpatient Hospital Stay: Payer: 59

## 2022-12-18 DIAGNOSIS — R29898 Other symptoms and signs involving the musculoskeletal system: Secondary | ICD-10-CM | POA: Diagnosis not present

## 2022-12-18 MED ORDER — GADOBUTROL 1 MMOL/ML IV SOLN
7.0000 mL | Freq: Once | INTRAVENOUS | Status: AC | PRN
  Administered 2022-12-18: 7 mL via INTRAVENOUS

## 2022-12-18 MED ORDER — SODIUM CHLORIDE 0.9 % IV SOLN: INTRAVENOUS | Status: AC

## 2022-12-18 NOTE — Progress Notes (Signed)
Neurology progress notes  S: Patient's weakness is diffusely improved, RLE strength now equal to LLE strength.   O:  Exam  Vitals:   12/18/22 0359 12/18/22 0744  BP: 136/78 125/79  Pulse: (!) 105 (!) 103  Resp: 20 18  Temp: 98 F (36.7 C) 97.9 F (36.6 C)  SpO2: 95% 91%    Gen: patient lying in bed, NAD CV: extremities appear well-perfused Resp: normal WOB  Neurologic exam MS: alert, oriented x4, follows commands Speech: no dysarthria, no aphasia CN: PERRL, VFF, EOMI, sensation intact, face symmetric, hearing intact to voice Motor: 4+/5 strength throughout, no focality Sensory: SILT Reflexes: 3+ and symmetric BUE, 2+ brisk BLE no clonus  Coordination: FNF intact bilat Gait: deferred  MRI Brain 1. No acute finding. Moderate to marked chronic small-vessel ischemic changes of the pons and cerebral hemispheric white matter, similar to the study of 05/18/2020. 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 2. 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  MRI c spine wwo 1. Normal MRI appearance of the cervical spinal cord. No cord signal changes to suggest myelopathy or other abnormality. 2. Multilevel cervical spondylosis with resultant mild diffuse spinal stenosis at C3-4 through C7-T1. 3. Mild left C4, C7, and C8 foraminal stenosis related to disc bulge and uncovertebral disease. 4. 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  MRI t spine wwo 1. Normal MRI appearance of the thoracic spinal cord. No findings to explain patient's symptoms identified. 2. Minor thoracic spondylosis for age. No significant stenosis or neural impingement. 3. Innumerable cystic lesions within the partially visualized liver, incompletely assessed on this exam. Again, further evaluation with dedicated MRI of the abdomen, with and without contrast, recommended for further evaluation.   MRI l  spine wo contrast 1. Disc bulge with small right subarticular disc protrusion at L4-5, potentially affecting the descending right L5 nerve root. Moderate canal with mild bilateral L4 foraminal stenosis at this level. 2. Disc bulge with facet hypertrophy at L3-4 with resultant mild to moderate spinal stenosis, with mild bilateral L3 foraminal narrowing. 3. Innumerable cystic lesions within the visualized liver. While these could reflect benign cysts, these are incompletely assessed on this exam. Correlation with dedicated abdominal MRI recommended for further evaluation.   CNS imaging personally reviewed; I agree with above interpretations  A/P: Is a 67 year old woman with a past medical history significant for diabetes, hypertension, stroke in 2022 who presented with weakness in all extremities which was initially worse in the right lower extremity.  Her weakness has improved diffusely in her right lower extremity is equal in strength to her left.  She did not have CNS of findings to explain her weakness or her hyperreflexia.  She did have some possible impingement of the right lower L5 nerve root on MRI L-spine but the findings on imaging are unlikely to be severe enough to cause weakness, her weakness was not limited to her right lower extremity, and her right lower extremity has improved to the point where there is no longer any focality on her exam therefore this is felt to be unrelated.  Point favor her weakness to be secondary to general deconditioning.  - PT/OT - No further neurologic workup indicated at this time; neurology will be available prn for questions going forward.  Bing Neighbors, MD [facility] [phone]  If 7pm- 7am, please page neurology on call as listed in AMION.

## 2022-12-18 NOTE — Progress Notes (Signed)
GI Inpatient Follow-up Note  Subjective:  Patient seen in follow-up for cryptogenic cirrhosis with liver lesion. No acute events overnight. MRI abdomen w/wo contrast performed overnight. She has not been told the results. She denies any new complaints at this time. No family bedside. She is eating breakfast at time of our encounter this morning.   Scheduled Inpatient Medications:   amLODipine  10 mg Oral Daily   dextromethorphan-guaiFENesin  1 tablet Oral BID   enoxaparin (LOVENOX) injection  40 mg Subcutaneous Q24H   hydrALAZINE  50 mg Oral Q8H   multivitamin with minerals  1 tablet Oral Daily   pantoprazole  20 mg Oral BID   predniSONE  40 mg Oral Q breakfast   sodium chloride flush  3 mL Intravenous Q12H    PRN Inpatient Medications:  acetaminophen **OR** acetaminophen, ipratropium-albuterol, ondansetron **OR** ondansetron (ZOFRAN) IV, polyethylene glycol  Review of Systems: Constitutional: + weight loss Eyes: No changes in vision. ENT: No oral lesions, sore throat.  GI: see HPI.  Heme/Lymph: No easy bruising.  CV: No chest pain.  GU: No hematuria.  Integumentary: No rashes.  Neuro: No headaches.  Psych: No depression/anxiety.  Endocrine: No heat/cold intolerance.  Allergic/Immunologic: No urticaria.  Resp: No cough, SOB.  Musculoskeletal: No joint swelling.    Physical Examination: BP 125/79 (BP Location: Left Arm)   Pulse (!) 103   Temp 97.9 F (36.6 C)   Resp 18   Ht 6' (1.829 m)   Wt 74 kg   SpO2 91%   BMI 22.13 kg/m  Gen: NAD, alert and oriented x 4 HEENT: PEERLA, EOMI, Neck: supple, no JVD or thyromegaly Chest: CTA bilaterally, no wheezes, crackles, or other adventitious sounds CV: RRR, no m/g/c/r Abd: soft, NT, ND, +BS in all four quadrants; no HSM, guarding, ridigity, or rebound tenderness Ext: no edema, well perfused with 2+ pulses, Skin: no rash or lesions noted Lymph: no LAD  Data: Lab Results  Component Value Date   WBC 13.0 (H)  12/18/2022   HGB 10.8 (L) 12/18/2022   HCT 32.5 (L) 12/18/2022   MCV 80.6 12/18/2022   PLT 369 12/18/2022   Recent Labs  Lab 12/14/22 1049 12/15/22 0222 12/18/22 0456  HGB 11.7* 11.3* 10.8*   Lab Results  Component Value Date   NA 136 12/18/2022   K 3.9 12/18/2022   CL 102 12/18/2022   CO2 23 12/18/2022   BUN 38 (H) 12/18/2022   CREATININE 1.53 (H) 12/18/2022   Lab Results  Component Value Date   ALT 40 12/18/2022   AST 94 (H) 12/18/2022   ALKPHOS 209 (H) 12/18/2022   BILITOT 1.3 (H) 12/18/2022   Recent Labs  Lab 12/18/22 0456  INR 1.1   MR abdomen w/wo contrast 12/18/2022: IMPRESSION: 1. Hepatomegaly with very extensive, nearly confluent masslike and nodular signal abnormality throughout the liver, most consistent with extensive hepatic metastatic disease. 2. Diffuse, heterogeneous marrow enhancement, highly suspicious for diffuse osseous metastatic disease. 3. Probable partially imaged left breast mass. Constellation of findings is highly suggestive of diffusely metastatic breast malignancy. Consider additional imaging of the chest and correlation with mammography.  MRI Thoracic Spine w/wo contrast 12/17/2022: IMPRESSION: 1. Normal MRI appearance of the thoracic spinal cord. No findings to explain patient's symptoms identified. 2. Minor thoracic spondylosis for age. No significant stenosis or neural impingement. 3. Innumerable cystic lesions within the partially visualized liver, incompletely assessed on this exam. Again, further evaluation with dedicated MRI of the abdomen, with and without contrast, recommended  for further evaluation.   US Liver 12/14/2022: IMPRESSION: 1. Hepatic cirrhosis. 2. 1.2 cm x 1.0 cm x 1.2 cm hypoechoic liver lesion, as described above. MRI correlation is recommended to further exclude the presence of an underlying neoplastic process.   Assessment/Plan:   67 y/o AA female with a PMH of HTN, HLD, T2DM, COPD, hx of CVA, and  tobacco abuse presented to the [facility] ED 7/30 for chief complaint of right lower extremity weakness of uncertain etiology. She was found to have radiographic evidence of cirrhosis with elevated LFTs and right liver lesion of uncertain etiology. GI consulted for further evaluation and management.    Cryptogenic cirrhosis of the liver, compensated    Right liver lesion - 1.2 cm x 1.0 cm x 1.2 cm lesion in right lobe of liver. MRI abdomen w/wo contrast performed overnight showed concerns for diffusely metastatic breast cancer with hepatic and osseous mets.    Elevated LFTs - mixed pattern - likely 2/2 metastatic disease to liver    Right lower extremity weakness   COPD with acute exacerbation    AKI   Hx of CVA on chronic antiplatelet therapy   T2DM   DNR status   Recommendations:  - Reviewed MRI findings with patient in room this morning which are concerning for diffusely metastatic breast cancer with mets to liver and bones.  - She will need CT chest and mammogram for further evaluation along with oncology and palliative care consult. Defer to hospital team.  - Continue to follow-up on hepatic serologies  - Continue to trend LFTs - Repeat CBC, CMP, INR daily - Avoid NSAIDs. Avoid hepatotoxins.  - Carb modified diet OK for now - GI following along with you for now    Please call with questions or concerns.   Jacob Moores, PA-C [facility] [phone]

## 2022-12-18 NOTE — Progress Notes (Addendum)
[facility] Progress Note  Patient: Stephanie [name]    ZOX:[id number]  DOA: 12/14/2022     Date of Service: the patient was seen and examined on 12/18/2022  Chief Complaint  Patient presents with   Weakness   Brief hospital course: [name] is a 67 y.o. female with medical history significant of hypertension, tobacco use, hyperlipidemia, CVA, type 2 diabetes, who presents to the ED due to right leg weakness started approximately 3 weeks ago, she had rapid onset right lower extremity weakness.  She feels that her thigh and upper calf are more affected than her feet.  Her symptoms have gradually worsened and led to multiple falls at home.  She denies any loss of consciousness or injuries though.  She denies any focal weakness in any other limb.  She denies any dizziness, vision loss, headache, dysphagia, dysarthria, facial droop.  Patient is admitted to Hospitalist service for further management, imaging rule out stroke. Neurology, Neurosurgery teams consulted for abnormal MRI spine findings. MRI L spine findings does not correlate with her weakness. She does have generalized weakness. Today she did complain of shortness of breath, weakness, cough. Tachycardia, tachypnea noted. Chest Xray, Echo, EKG ordered. Duonebs, Lasix, Mucinex ordered.   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  CT of the head and MRI brain negative for any acute infarct, however moderate to severe chronic small vessel disease.   MRI L spine reviewed with neurosurgery - not significant to cause symptoms. Neurology evaluation appreciated. Advised MRI C spine given all 4 limbs  weak with brisk reflexes. MRI C spine reviewed by neurosurgery advised to get MRI thoracic spine which did not reveal any acute abnormalities. Discussed with neurology team - diffuse weakness likely due to deconditioning. PT advised rehab placement.    Elevated LFTs Cystic liver lesions. RUQ sono shows cirrhosis, 1.2 cm hypoechoic lesion. MRI lumbar spine shows benign cystic lesions of liver though not fully visualized. Hepatitis panel negative. PT/INR wnl. GI evaluation called advised MRI abdomen. Follow recommendations. Hold home atorvastatin.   COPD with acute exacerbation  She does have SOB, cough. Chest xray shows no airspace disease. Echo reviewed is unremarkable. Trial of Lasix given. Continuous pulse oximetry. Supplemental oxygen to maintain oxygen saturation above 88%. Continue prednisone 40 mg to complete a 5-day course DuoNebs every 6 hours. Mucinex Q12 ordered.   Acute kidney injury Kidney function improved with IV fluids. Avoid nephrotoxic agents.  Encourage oral diet.   Prediabetes A1c ordered to prior labs. Diet, exercise and weight reduction advised.   Primary hypertension Continue Amlodipine 10 mg p.o. daily and hydralazine 50 p.o. every 8 hourly Monitor BP and titrate medications accordingly    CVA (cerebral vascular accident) (HCC) Continue Aspirin 81 mg daily. Statin held due to abnormal LFT.   Body mass index is 22.13 kg/m.  Interventions:  Diet: Carb modified diet DVT Prophylaxis: Subcutaneous Lovenox   Advance goals of care discussion: DNR  Family Communication: family was not present at bedside, at the time of interview.  The pt provided permission to discuss medical plan with the family. Opportunity was given to ask question and all questions were answered satisfactorily.   Disposition:  Pt is  from home, admitted with metastatic malignancy most likely left breast cancer, workup pending, which precludes a safe discharge. Discharge to  SNF. May need 1-2 more days for further workup. TOC following for discharge planning  Subjective: No significant overnight events, patient denies any shortness of breath, no chest pain or palpitation.  Generalized weakness is getting better, weakness in the right lower extremity is improving.  Denies any new neurological symptoms.  Physical Exam: General: NAD, lying comfortably Appear in no distress, affect appropriate Eyes: PERRLA ENT: Oral Mucosa Clear, moist  Neck: no JVD,  Cardiovascular: S1 and S2 Present, no Murmur,  Respiratory: good respiratory effort, Bilateral Air entry equal and Decreased, no Crackles, no wheezes Abdomen: Bowel Sound present, Soft and no tenderness,  Skin: no rashes Extremities: no Pedal edema, no calf tenderness Neurologic: without any new focal findings, RLE power 4/5 Gait not checked due to patient safety concerns  Vitals:   12/17/22 1925 12/18/22 0359 12/18/22 0744 12/18/22 1451  BP: 118/69 136/78 125/79 126/76  Pulse: 97 (!) 105 (!) 103 100  Resp: 20 20 18 20   Temp: 98.2 F (36.8 C) 98 F (36.7 C) 97.9 F (36.6 C) 98 F (36.7 C)  TempSrc: Oral Oral  Oral  SpO2: 98% 95% 91% 97%  Weight:      Height:        Intake/Output Summary (Last 24 hours) at 12/18/2022 1547 Last data filed at 12/18/2022 0549 Gross per 24 hour  Intake 118 ml  Output --  Net 118 ml   Filed Weights   12/14/22 1037  Weight: 74 kg    Data Reviewed: I have personally reviewed and interpreted daily labs, tele strips, imagings as discussed above. I reviewed all nursing notes, pharmacy notes, vitals, pertinent old records I have discussed plan of care as described above with RN and patient/family.  CBC: Recent Labs  Lab 12/14/22 1049 12/15/22 0222 12/18/22 0456  WBC 9.1 7.2 13.0*  HGB 11.7* 11.3* 10.8*  HCT 35.7* 34.5* 32.5*  MCV 83.2 82.7 80.6  PLT 319 310 369   Basic Metabolic Panel: Recent Labs  Lab 12/14/22 1049 12/15/22 0222 12/17/22 0325 12/18/22 0456   NA 136 135 134* 136  K 3.4* 3.7 3.8 3.9  CL 100 105 103 102  CO2 21* 20* 24 23  GLUCOSE 103* 125* 123* 114*  BUN 29* 26* 28* 38*  CREATININE 1.45* 1.16* 1.29* 1.53*  CALCIUM 10.6* 10.2 10.1 10.4*    Studies: CT CHEST WO CONTRAST  Result Date: 12/18/2022 CLINICAL DATA:  Evaluate for occult malignancy EXAM: CT CHEST WITHOUT CONTRAST TECHNIQUE: Multidetector CT imaging of the chest was performed following the standard protocol without IV contrast. RADIATION DOSE REDUCTION: This exam was performed according to the departmental dose-optimization program which includes automated exposure control, adjustment of the mA and/or kV according to patient size and/or use of iterative reconstruction technique. COMPARISON:  06/09/2020 FINDINGS: Cardiovascular: Scattered calcifications are seen in thoracic aorta. There is ectasia of right main pulmonary artery measuring 3.4 cm. Mediastinum/Nodes: There is 1.8 x 1.2 cm lymph node at the right cardiophrenic angle. There are enlarged lymph nodes in left axilla measuring up to 1.4 cm in short axis. There is 4.5 x 3.4 cm soft tissue mass in the upper aspect of left breast. There is skin thickening adjacent to this mass. Lungs/Pleura: Centrilobular emphysema is seen. Small linear patchy infiltrate is seen in right lower lobe. Increased markings are seen in the posterior segment of right upper lobe. In image 47  of series 4, there is 3 mm pleural-based nodule in left upper lobe. In image 50, there is a 2 mm nodule in the lateral aspect of right upper lobe. In image 68, there is a 3 mm nodule inseparable from the posterior aspect of minor fissure. There is no pleural effusion or pneumothorax. Upper Abdomen: Lobulations are seen in the margin of the liver. There are multiple low-density space-occupying lesions suggesting hepatic metastatic disease. There is 2 mm calcific density in the upper pole of left kidney, possibly left renal stone. Musculoskeletal: No focal lytic or  sclerotic lesions are seen. IMPRESSION: There is 4.5 x 3.4 cm new soft tissue mass in the upper portion of left breast. This may suggest primary or metastatic malignant neoplasm. There are abnormally enlarged lymph nodes in left axilla suggesting metastatic lymphadenopathy. There is 1.8 x 1.2 cm lymph node at the right cardiophrenic angle suggesting possible metastatic lymphadenopathy. PET-CT and tissue sampling may be considered. There are a few scattered tiny nodular densities in both lungs measuring up to 3 mm. This finding may suggest incidental granulomas or early pulmonary metastatic disease. There are space-occupying lesions in liver suggesting hepatic metastatic disease. Liver margin is lobulated suggesting possible cirrhosis. There is 2 mm left renal calculus. Electronically Signed   By: Ernie Avena M.D.   On: 12/18/2022 11:29   MR ABDOMEN W WO CONTRAST  Result Date: 12/18/2022 CLINICAL DATA:  Liver lesions, cirrhosis EXAM: MRI ABDOMEN WITHOUT AND WITH CONTRAST TECHNIQUE: Multiplanar multisequence MR imaging of the abdomen was performed both before and after the administration of intravenous contrast. CONTRAST:  7mL GADAVIST GADOBUTROL 1 MMOL/ML IV SOLN COMPARISON:  MR thoracic spine, 12/17/2022, CT chest abdomen pelvis, 06/09/2020 FINDINGS: Lower chest: No acute abnormality. Hepatobiliary: Hepatomegaly, maximum coronal span 23.0 cm. Coarse, nodular contour of the liver. Very extensive, nearly confluent masslike and nodular signal abnormality throughout the liver (series 4, image 17). Although multiphasic contrast enhanced sequences are significantly limited by breath motion artifact, these lesions are mostly isoenhancing to liver parenchyma, however some are hypoenhancing, for example in the anterior right lobe of the liver (series 20, image 16) and some are faintly hyperenhancing, for example in the peripheral inferior right lobe of the liver (series 20, image 36). Given number and confluent  character these are difficult to distinctly measure however a relatively solitary index lesion in the posterior right lobe of the liver, hepatic segment VI, measures 3.2 x 2.8 cm (series 3, image 24). No gallstones, gallbladder wall thickening, or biliary dilatation. Pancreas: Unremarkable. No pancreatic ductal dilatation or surrounding inflammatory changes. Spleen: Normal in size without significant abnormality. Adrenals/Urinary Tract: Adrenal glands are unremarkable. Simple, benign bilateral renal cortical cysts, for which no further follow-up or characterization is required. Kidneys are otherwise normal, without obvious renal calculi, solid lesion, or hydronephrosis. Stomach/Bowel: Stomach is within normal limits. No evidence of bowel wall thickening, distention, or inflammatory changes. Vascular/Lymphatic: No significant vascular findings are present. No enlarged abdominal lymph nodes. Other: No abdominal wall hernia or abnormality. No ascites. Probable partially imaged left breast mass (series 4, image 1) Musculoskeletal: No acute osseous findings. Diffuse, heterogeneous marrow enhancement (series 19, image 53). IMPRESSION: 1. Hepatomegaly with very extensive, nearly confluent masslike and nodular signal abnormality throughout the liver, most consistent with extensive hepatic metastatic disease. 2. Diffuse, heterogeneous marrow enhancement, highly suspicious for diffuse osseous metastatic disease. 3. Probable partially imaged left breast mass. Constellation of findings is highly suggestive of diffusely metastatic breast malignancy. Consider additional imaging of the chest  and correlation with mammography. Electronically Signed   By: Jearld Lesch M.D.   On: 12/18/2022 05:26    Scheduled Meds:  amLODipine  10 mg Oral Daily   dextromethorphan-guaiFENesin  1 tablet Oral BID   enoxaparin (LOVENOX) injection  40 mg Subcutaneous Q24H   hydrALAZINE  50 mg Oral Q8H   multivitamin with minerals  1 tablet Oral  Daily   pantoprazole  20 mg Oral BID   predniSONE  40 mg Oral Q breakfast   sodium chloride flush  3 mL Intravenous Q12H   Continuous Infusions:  sodium chloride 75 mL/hr at 12/18/22 1320   PRN Meds: acetaminophen **OR** acetaminophen, ipratropium-albuterol, ondansetron **OR** ondansetron (ZOFRAN) IV, polyethylene glycol  Time spent: 35 minutes  Author: Gillis Santa. MD [facility] 12/18/2022 3:47 PM  To reach On-call, see care teams to locate the attending and reach out to them via [email or web address]. If 7PM-7AM, please contact night-coverage If you still have difficulty reaching the attending provider, please page the [facility] (Director on Call) for [facility] on amion for assistance.

## 2022-12-18 NOTE — Consult Note (Signed)
[facility] CONSULT NOTE  Patient Care Team: Center, [facility] as PCP - General (General Practice) Lorn Junes, FNP (Family Medicine) [place], Carlye Grippe, RN  CHIEF COMPLAINTS/PURPOSE OF CONSULTATION: abnormal imaging- concering for breast cancer/liver mets.   HISTORY OF PRESENTING ILLNESS:   Stephanie [name] 67 y.o.  female with history hypertension, tobacco use, hyperlipidemia, CVA x2 , type 2 diabetes, with out history of any malignancy, who presents to the ED due to right leg weakness.  Patient to noted to have mildly elevated LFTs. [place] noted was abnormal. This led to MRI liver- multiple liver lesions. CT scan chest shows- right breast mass; axillary LN noted- concerning for breast cancer with wide spread metastases.  Hemoglobin: 10  Patient lives in home with her brother. But states she will plan to move to her aunt at discharge. Given hx of stroke, walks with walker. Patient does patient does not drive. Her cousin drive her to groceries. 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    Review of Systems  Constitutional:  Positive for malaise/fatigue. Negative for chills, diaphoresis and fever.  HENT:  Negative for nosebleeds and sore throat.   Eyes:  Negative for double vision.  Respiratory:  Negative for cough, hemoptysis, sputum production, shortness of breath and wheezing.   Cardiovascular:  Negative for chest pain, palpitations, orthopnea and leg swelling.  Gastrointestinal:  Positive for nausea and vomiting. Negative for abdominal pain, blood in stool, constipation, diarrhea, heartburn and melena.  Genitourinary:  Negative for dysuria, frequency and urgency.  Musculoskeletal:  Negative for back pain and joint pain.  Skin: Negative.  Negative for itching and rash.  Neurological:  Positive for focal weakness. Negative for dizziness, tingling, weakness and headaches.  Endo/Heme/Allergies:  Does not bruise/bleed easily.   Psychiatric/Behavioral:  Negative for depression. The patient is not nervous/anxious and does not have insomnia.      MEDICAL HISTORY:  Past Medical History:  Diagnosis Date   Diabetes mellitus without complication (HCC)    Hypertension    Stroke (HCC) 05/17/2020    SURGICAL HISTORY: Past Surgical History:  Procedure Laterality Date   ABDOMINAL HYSTERECTOMY     at 67 years old   CLOSED REDUCTION MANDIBLE Bilateral 05/23/2020   Procedure: CLOSED REDUCTION MANDIBULAR;  Surgeon: Vernie Murders, MD;  Location: [facility] ORS;  Service: ENT;  Laterality: Bilateral;   CLOSED REDUCTION MANDIBLE N/A 06/03/2020   Procedure: CLOSED REDUCTION MANDIBULAR;  Surgeon: Linus Salmons, MD;  Location: [facility] ORS;  Service: ENT;  Laterality: N/A;   COLONOSCOPY WITH PROPOFOL N/A 06/10/2020   Procedure: COLONOSCOPY WITH PROPOFOL;  Surgeon: Pasty Spillers, MD;  Location: [facility] ENDOSCOPY;  Service: Endoscopy;  Laterality: N/A;    SOCIAL HISTORY: Social History   Socioeconomic History   Marital status: Single    Spouse name: Not on file   Number of children: Not on file   Years of education: Not on file   Highest education level: Not on file  Occupational History   Not on file  Tobacco Use   Smoking status: Former    Current packs/day: 0.00    Types: Cigarettes    Quit date: 04/2020    Years since quitting: 2.6   Smokeless tobacco: Never  Vaping Use   Vaping status: Never Used  Substance and Sexual Activity   Alcohol use: Never   Drug use: Never   Sexual activity: Not on file  Other Topics Concern   Not on file  Social  History Narrative   Not on file   Social Determinants of Health   Financial Resource Strain: Not on file  Food Insecurity: No Food Insecurity (12/15/2022)   Hunger Vital Sign    Worried About Running Out of Food in the Last Year: Never true    Ran Out of Food in the Last Year: Never true  Transportation Needs: No Transportation Needs (12/15/2022)   PRAPARE -  Administrator, Civil Service (Medical): No    Lack of Transportation (Non-Medical): No  Physical Activity: Not on file  Stress: Not on file  Social Connections: Not on file  Intimate Partner Violence: Not At Risk (12/15/2022)   Humiliation, Afraid, Rape, and Kick questionnaire    Fear of Current or Ex-Partner: No    Emotionally Abused: No    Physically Abused: No    Sexually Abused: No    FAMILY HISTORY: Family History  Problem Relation Age of Onset   COPD Mother    Hypertension Mother    Stroke Maternal Grandmother    Hypertension Maternal Grandmother     ALLERGIES:  is allergic to codeine and lisinopril.  MEDICATIONS:  Current Facility-Administered Medications  Medication Dose Route Frequency Provider Last Rate Last Admin   0.9 %  sodium chloride infusion   Intravenous Continuous Gillis Santa, MD 75 mL/hr at 12/18/22 1320 New Bag at 12/18/22 1320   acetaminophen (TYLENOL) tablet 650 mg  650 mg Oral Q6H PRN Verdene Lennert, MD   650 mg at 12/17/22 0154   Or   acetaminophen (TYLENOL) suppository 650 mg  650 mg Rectal Q6H PRN Verdene Lennert, MD       amLODipine (NORVASC) tablet 10 mg  10 mg Oral Daily Verdene Lennert, MD   10 mg at 12/18/22 0959   dextromethorphan-guaiFENesin (MUCINEX DM) 30-600 MG per 12 hr tablet 1 tablet  1 tablet Oral BID Marcelino Duster, MD   1 tablet at 12/18/22 0959   enoxaparin (LOVENOX) injection 40 mg  40 mg Subcutaneous Q24H Verdene Lennert, MD   40 mg at 12/17/22 2234   hydrALAZINE (APRESOLINE) tablet 50 mg  50 mg Oral Q8H Gillis Santa, MD   50 mg at 12/18/22 1452   ipratropium-albuterol (DUONEB) 0.5-2.5 (3) MG/3ML nebulizer solution 3 mL  3 mL Nebulization Q4H PRN Marcelino Duster, MD       multivitamin with minerals tablet 1 tablet  1 tablet Oral Daily Otelia Sergeant, RPH   1 tablet at 12/18/22 0959   ondansetron (ZOFRAN) tablet 4 mg  4 mg Oral Q6H PRN Verdene Lennert, MD       Or   ondansetron (ZOFRAN) injection 4 mg  4  mg Intravenous Q6H PRN Verdene Lennert, MD   4 mg at 12/17/22 0142   pantoprazole (PROTONIX) EC tablet 20 mg  20 mg Oral BID Verdene Lennert, MD   20 mg at 12/18/22 0959   polyethylene glycol (MIRALAX / GLYCOLAX) packet 17 g  17 g Oral Daily PRN Verdene Lennert, MD   17 g at 12/18/22 1613   predniSONE (DELTASONE) tablet 40 mg  40 mg Oral Q breakfast Verdene Lennert, MD   40 mg at 12/18/22 0959   sodium chloride flush (NS) 0.9 % injection 3 mL  3 mL Intravenous Q12H Verdene Lennert, MD   3 mL at 12/18/22 1000     PHYSICAL EXAMINATION:  Vitals:   12/18/22 1451 12/18/22 1956  BP: 126/76 125/71  Pulse: 100 92  Resp: 20 20  Temp: 98 F (  36.7 C) 98 F (36.7 C)  SpO2: 97% 97%   Filed Weights   12/14/22 1037  Weight: 163 lb 2.3 oz (74 kg)   Liver enlarged; No lymphadenopathy noted.   Physical Exam Vitals and nursing note reviewed.  Constitutional:      Comments:     HENT:     Head: Normocephalic and atraumatic.     Mouth/Throat:     Mouth: Mucous membranes are moist.     Pharynx: No oropharyngeal exudate.  Eyes:     Pupils: Pupils are equal, round, and reactive to light.  Cardiovascular:     Rate and Rhythm: Normal rate and regular rhythm.  Pulmonary:     Effort: No respiratory distress.     Breath sounds: No wheezing.     Comments: Decreased breath sounds bilaterally at bases.  No wheeze or crackles Abdominal:     General: Bowel sounds are normal. There is no distension.     Palpations: Abdomen is soft. There is no mass.     Tenderness: There is no abdominal tenderness. There is no guarding or rebound.  Musculoskeletal:        General: No tenderness. Normal range of motion.     Cervical back: Normal range of motion and neck supple.  Skin:    General: Skin is warm.  Neurological:     Mental Status: She is alert and oriented to person, place, and time.  Psychiatric:        Mood and Affect: Affect normal.        Judgment: Judgment normal.      LABORATORY DATA:  I  have reviewed the data as listed Lab Results  Component Value Date   WBC 13.0 (H) 12/18/2022   HGB 10.8 (L) 12/18/2022   HCT 32.5 (L) 12/18/2022   MCV 80.6 12/18/2022   PLT 369 12/18/2022   Recent Labs    12/15/22 0222 12/17/22 0325 12/18/22 0456  NA 135 134* 136  K 3.7 3.8 3.9  CL 105 103 102  CO2 20* 24 23  GLUCOSE 125* 123* 114*  BUN 26* 28* 38*  CREATININE 1.16* 1.29* 1.53*  CALCIUM 10.2 10.1 10.4*  GFRNONAA 52* 46* 37*  PROT 7.4 6.4* 6.9  ALBUMIN 3.3* 2.9* 3.0*  AST 103* 148* 94*  ALT 32 48* 40  ALKPHOS 229* 211* 209*  BILITOT 2.2* 1.2 1.3*    RADIOGRAPHIC STUDIES: I have personally reviewed the radiological images as listed and agreed with the findings in the report. CT CHEST WO CONTRAST  Result Date: 12/18/2022 CLINICAL DATA:  Evaluate for occult malignancy EXAM: CT CHEST WITHOUT CONTRAST TECHNIQUE: Multidetector CT imaging of the chest was performed following the standard protocol without IV contrast. RADIATION DOSE REDUCTION: This exam was performed according to the departmental dose-optimization program which includes automated exposure control, adjustment of the mA and/or kV according to patient size and/or use of iterative reconstruction technique. COMPARISON:  06/09/2020 FINDINGS: Cardiovascular: Scattered calcifications are seen in thoracic aorta. There is ectasia of right main pulmonary artery measuring 3.4 cm. Mediastinum/Nodes: There is 1.8 x 1.2 cm lymph node at the right cardiophrenic angle. There are enlarged lymph nodes in left axilla measuring up to 1.4 cm in short axis. There is 4.5 x 3.4 cm soft tissue mass in the upper aspect of left breast. There is skin thickening adjacent to this mass. Lungs/Pleura: Centrilobular emphysema is seen. Small linear patchy infiltrate is seen in right lower lobe. Increased markings are seen in the posterior segment  of right upper lobe. In image 59 of series 4, there is 3 mm pleural-based nodule in left upper lobe. In image 50,  there is a 2 mm nodule in the lateral aspect of right upper lobe. In image 68, there is a 3 mm nodule inseparable from the posterior aspect of minor fissure. There is no pleural effusion or pneumothorax. Upper Abdomen: Lobulations are seen in the margin of the liver. There are multiple low-density space-occupying lesions suggesting hepatic metastatic disease. There is 2 mm calcific density in the upper pole of left kidney, possibly left renal stone. Musculoskeletal: No focal lytic or sclerotic lesions are seen. IMPRESSION: There is 4.5 x 3.4 cm new soft tissue mass in the upper portion of left breast. This may suggest primary or metastatic malignant neoplasm. There are abnormally enlarged lymph nodes in left axilla suggesting metastatic lymphadenopathy. There is 1.8 x 1.2 cm lymph node at the right cardiophrenic angle suggesting possible metastatic lymphadenopathy. PET-CT and tissue sampling may be considered. There are a few scattered tiny nodular densities in both lungs measuring up to 3 mm. This finding may suggest incidental granulomas or early pulmonary metastatic disease. There are space-occupying lesions in liver suggesting hepatic metastatic disease. Liver margin is lobulated suggesting possible cirrhosis. There is 2 mm left renal calculus. Electronically Signed   By: Ernie Avena M.D.   On: 12/18/2022 11:29   MR ABDOMEN W WO CONTRAST  Result Date: 12/18/2022 CLINICAL DATA:  Liver lesions, cirrhosis EXAM: MRI ABDOMEN WITHOUT AND WITH CONTRAST TECHNIQUE: Multiplanar multisequence MR imaging of the abdomen was performed both before and after the administration of intravenous contrast. CONTRAST:  7mL GADAVIST GADOBUTROL 1 MMOL/ML IV SOLN COMPARISON:  MR thoracic spine, 12/17/2022, CT chest abdomen pelvis, 06/09/2020 FINDINGS: Lower chest: No acute abnormality. Hepatobiliary: Hepatomegaly, maximum coronal span 23.0 cm. Coarse, nodular contour of the liver. Very extensive, nearly confluent masslike and  nodular signal abnormality throughout the liver (series 4, image 17). Although multiphasic contrast enhanced sequences are significantly limited by breath motion artifact, these lesions are mostly isoenhancing to liver parenchyma, however some are hypoenhancing, for example in the anterior right lobe of the liver (series 20, image 16) and some are faintly hyperenhancing, for example in the peripheral inferior right lobe of the liver (series 20, image 36). Given number and confluent character these are difficult to distinctly measure however a relatively solitary index lesion in the posterior right lobe of the liver, hepatic segment VI, measures 3.2 x 2.8 cm (series 3, image 24). No gallstones, gallbladder wall thickening, or biliary dilatation. Pancreas: Unremarkable. No pancreatic ductal dilatation or surrounding inflammatory changes. Spleen: Normal in size without significant abnormality. Adrenals/Urinary Tract: Adrenal glands are unremarkable. Simple, benign bilateral renal cortical cysts, for which no further follow-up or characterization is required. Kidneys are otherwise normal, without obvious renal calculi, solid lesion, or hydronephrosis. Stomach/Bowel: Stomach is within normal limits. No evidence of bowel wall thickening, distention, or inflammatory changes. Vascular/Lymphatic: No significant vascular findings are present. No enlarged abdominal lymph nodes. Other: No abdominal wall hernia or abnormality. No ascites. Probable partially imaged left breast mass (series 4, image 1) Musculoskeletal: No acute osseous findings. Diffuse, heterogeneous marrow enhancement (series 19, image 53). IMPRESSION: 1. Hepatomegaly with very extensive, nearly confluent masslike and nodular signal abnormality throughout the liver, most consistent with extensive hepatic metastatic disease. 2. Diffuse, heterogeneous marrow enhancement, highly suspicious for diffuse osseous metastatic disease. 3. Probable partially imaged left  breast mass. Constellation of findings is highly suggestive of diffusely metastatic  breast malignancy. Consider additional imaging of the chest and correlation with mammography. Electronically Signed   By: Jearld Lesch M.D.   On: 12/18/2022 05:26   ECHOCARDIOGRAM COMPLETE  Result Date: 12/17/2022    ECHOCARDIOGRAM REPORT   Patient Name:   CARLETHIA MESQUITA Date of Exam: 12/17/2022 Medical Rec #:  [id number]       Height:       72.0 in Accession #:    [id number]      Weight:       163.1 lb Date of Birth:  1955/12/02       BSA:          1.954 m Patient Age:    67 years        BP:           120/64 mmHg Patient Gender: F               HR:           106 bpm. Exam Location:  [facility] Procedure: 2D Echo, Cardiac Doppler and Color Doppler Indications:     Dyspnea R06.00  History:         Patient has prior history of Echocardiogram examinations, most                  recent 05/19/2020. Stroke; Risk Factors:Hypertension and                  Diabetes.  Sonographer:     [name] Referring Phys:  [id number] Marcelino Duster Diagnosing Phys: Debbe Odea MD  Sonographer Comments: Suboptimal apical window. IMPRESSIONS  1. Left ventricular ejection fraction, by estimation, is 70 to 75%. The left ventricle has hyperdynamic function. The left ventricle has no regional wall motion abnormalities. There is mild left ventricular hypertrophy. Left ventricular diastolic parameters are consistent with Grade I diastolic dysfunction (impaired relaxation).  2. Right ventricular systolic function is normal. The right ventricular size is normal.  3. The mitral valve is normal in structure. No evidence of mitral valve regurgitation.  4. The aortic valve is grossly normal. Aortic valve regurgitation is not visualized.  5. The inferior vena cava is normal in size with greater than 50% respiratory variability, suggesting right atrial pressure of 3 mmHg. FINDINGS  Left Ventricle: Left ventricular ejection fraction, by estimation, is 70 to 75%. The  left ventricle has hyperdynamic function. The left ventricle has no regional wall motion abnormalities. The left ventricular internal cavity size was normal in size. There is mild left ventricular hypertrophy. Left ventricular diastolic parameters are consistent with Grade I diastolic dysfunction (impaired relaxation). Right Ventricle: The right ventricular size is normal. No increase in right ventricular wall thickness. Right ventricular systolic function is normal. Left Atrium: Left atrial size was normal in size. Right Atrium: Right atrial size was normal in size. Pericardium: There is no evidence of pericardial effusion. Mitral Valve: The mitral valve is normal in structure. No evidence of mitral valve regurgitation. Tricuspid Valve: The tricuspid valve is normal in structure. Tricuspid valve regurgitation is not demonstrated. Aortic Valve: The aortic valve is grossly normal. Aortic valve regurgitation is not visualized. Aortic valve mean gradient measures 9.0 mmHg. Aortic valve peak gradient measures 16.0 mmHg. Aortic valve area, by VTI measures 3.68 cm. Pulmonic Valve: The pulmonic valve was normal in structure. Pulmonic valve regurgitation is not visualized. Aorta: The aortic root is normal in size and structure. Venous: The inferior vena cava was not well visualized. The inferior vena cava is normal  in size with greater than 50% respiratory variability, suggesting right atrial pressure of 3 mmHg. IAS/Shunts: No atrial level shunt detected by color flow Doppler.  LEFT VENTRICLE PLAX 2D LVIDd:         4.60 cm   Diastology LVIDs:         2.80 cm   LV e' medial:    9.57 cm/s LV PW:         1.10 cm   LV E/e' medial:  7.9 LV IVS:        1.20 cm   LV e' lateral:   5.98 cm/s LVOT diam:     2.10 cm   LV E/e' lateral: 12.7 LV SV:         93 LV SV Index:   48 LVOT Area:     3.46 cm  RIGHT VENTRICLE RV S prime:     22.70 cm/s TAPSE (M-mode): 2.6 cm LEFT ATRIUM             Index        RIGHT ATRIUM           Index LA  diam:        3.90 cm 2.00 cm/m   RA Area:     16.20 cm LA Vol (A2C):   29.6 ml 15.15 ml/m  RA Volume:   42.50 ml  21.75 ml/m LA Vol (A4C):   45.1 ml 23.08 ml/m LA Biplane Vol: 39.5 ml 20.22 ml/m  AORTIC VALVE AV Area (Vmax):    3.41 cm AV Area (Vmean):   3.07 cm AV Area (VTI):     3.68 cm AV Vmax:           200.00 cm/s AV Vmean:          133.000 cm/s AV VTI:            0.253 m AV Peak Grad:      16.0 mmHg AV Mean Grad:      9.0 mmHg LVOT Vmax:         197.00 cm/s LVOT Vmean:        118.000 cm/s LVOT VTI:          0.269 m LVOT/AV VTI ratio: 1.06  AORTA Ao Root diam: 2.80 cm MITRAL VALVE                TRICUSPID VALVE MV Area (PHT): 6.32 cm     TR Peak grad:   15.8 mmHg MV Decel Time: 120 msec     TR Vmax:        199.00 cm/s MV E velocity: 76.00 cm/s MV A velocity: 159.00 cm/s  SHUNTS MV E/A ratio:  0.48         Systemic VTI:  0.27 m                             Systemic Diam: 2.10 cm Debbe Odea MD Electronically signed by Debbe Odea MD Signature Date/Time: 12/17/2022/2:23:47 PM    Final    DG Chest 1 View  Result Date: 12/17/2022 CLINICAL DATA:  Shortness of breath. EXAM: CHEST  1 VIEW COMPARISON:  12/14/2022 FINDINGS: Single-view of the chest demonstrates slightly low lung volumes. No focal airspace disease or consolidation. No overt pulmonary edema. Heart and mediastinum are grossly stable. Patient appears to be mildly rotated on this examination. No acute bone abnormality. IMPRESSION: No active disease. Electronically Signed   By: Meriel Pica.D.  On: 12/17/2022 13:28   MR THORACIC SPINE W WO CONTRAST  Result Date: 12/17/2022 CLINICAL DATA:  Initial evaluation for right lower extremity weakness. EXAM: MRI THORACIC WITHOUT AND WITH CONTRAST TECHNIQUE: Multiplanar and multiecho pulse sequences of the thoracic spine were obtained without and with intravenous contrast. CONTRAST:  7mL GADAVIST GADOBUTROL 1 MMOL/ML IV SOLN COMPARISON:  None Available. FINDINGS: Alignment: Trace  dextroscoliosis with mild straightening of the normal midthoracic kyphosis. No listhesis. Vertebrae: Vertebral body height maintained without acute or chronic fracture. Bone marrow signal intensity markedly heterogeneous. Few small STIR hyperintense lesions involving the teeth to through T4 vertebral bodies noted, favored to reflect atypical hemangiomata. No other worrisome osseous lesions. No other abnormal marrow edema or enhancement. Cord:  Normal signal and morphology.  No abnormal enhancement. Paraspinal and other soft tissues: Paraspinous soft tissues are within normal limits. Trace layering bilateral pleural effusions noted. Innumerable cystic lesions noted within the partially visualized liver, incompletely assessed on this exam. Disc levels: Ordinary for age multilevel disc desiccation seen throughout the thoracic spine. No significant disc bulge or focal disc herniation. Posterior element hypertrophy noted at T6-7. No significant spinal stenosis. Foramina remain patent. No neural impingement. IMPRESSION: 1. Normal MRI appearance of the thoracic spinal cord. No findings to explain patient's symptoms identified. 2. Minor thoracic spondylosis for age. No significant stenosis or neural impingement. 3. Innumerable cystic lesions within the partially visualized liver, incompletely assessed on this exam. Again, further evaluation with dedicated MRI of the abdomen, with and without contrast, recommended for further evaluation. Electronically Signed   By: Rise Mu M.D.   On: 12/17/2022 04:40   MR CERVICAL SPINE W WO CONTRAST  Result Date: 12/16/2022 CLINICAL DATA:  Initial evaluation for weakness, hyper reflexia. EXAM: MRI CERVICAL SPINE WITHOUT AND WITH CONTRAST TECHNIQUE: Multiplanar and multiecho pulse sequences of the cervical spine, to include the craniocervical junction and cervicothoracic junction, were obtained without and with intravenous contrast. CONTRAST:  7mL GADAVIST GADOBUTROL 1  MMOL/ML IV SOLN COMPARISON:  None Available. FINDINGS: Alignment: Examination mildly degraded by motion. Straightening of the normal cervical lordosis. No significant listhesis. Vertebrae: Vertebral body height maintained without acute or chronic fracture. Bone marrow signal intensity diffusely heterogeneous. Subcentimeter stir hyperintense lesions involving the T2 and T3 vertebral bodies noted, favored to reflect small atypical hemangiomata noted. No worrisome osseous lesions. No other abnormal marrow edema or enhancement. Cord: Normal signal and morphology. No convincing cord signal changes seen on this motion degraded exam. No abnormal enhancement. Posterior Fossa, vertebral arteries, paraspinal tissues: Patient's known cavernous ICA aneurysm noted, partially visualized. Craniocervical junction within normal limits. Paraspinous soft tissues within normal limits. Normal flow voids seen within the vertebral arteries bilaterally. Disc levels: C2-C3: Mild disc bulge. No spinal stenosis. Foramina remain patent. C3-C4: Mild disc bulge with uncovertebral spurring. Flattening and partial effacement of the ventral thecal sac with resultant mild spinal stenosis. Mild left C4 foraminal narrowing. Right neural foramen remains patent. C4-C5: Mild diffuse disc bulge with uncovertebral spurring. Flattening and partial effacement of the ventral thecal sac. Mild cord flattening without cord signal changes. Mild spinal stenosis. Foramina remain patent. C5-C6: Mild disc bulge with uncovertebral spurring. Flattening and partial effacement of the ventral thecal sac with resultant mild spinal stenosis. Foramina remain patent. C6-C7: Degenerative intervertebral disc space narrowing with diffuse disc osteophyte complex. Posterior component flattens and partially effaces the ventral thecal sac. No more than mild spinal stenosis. Mild left C7 foraminal narrowing. Right neural foramen remains patent. C7-T1: Mild degenerative disc space  narrowing with diffuse disc bulge and uncovertebral spurring. Mild bilateral facet hypertrophy. Resultant mild spinal stenosis. Mild left C8 foraminal narrowing. Right neural foramen remains patent. IMPRESSION: 1. Normal MRI appearance of the cervical spinal cord. No cord signal changes to suggest myelopathy or other abnormality. 2. Multilevel cervical spondylosis with resultant mild diffuse spinal stenosis at C3-4 through C7-T1. 3. Mild left C4, C7, and C8 foraminal stenosis related to disc bulge and uncovertebral disease. 4. 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 Electronically Signed   By: Rise Mu M.D.   On: 12/16/2022 06:21   MR LUMBAR SPINE WO CONTRAST  Result Date: 12/14/2022 CLINICAL DATA:  Initial evaluation for acute myelopathy. EXAM: MRI LUMBAR SPINE WITHOUT CONTRAST TECHNIQUE: Multiplanar, multisequence MR imaging of the lumbar spine was performed. No intravenous contrast was administered. COMPARISON:  None Available. FINDINGS: Segmentation:  Standard. Alignment:  Physiologic. Vertebrae: Vertebral body height maintained without acute or chronic fracture. Bone marrow signal intensity diffusely heterogeneous. No worrisome osseous lesions or abnormal marrow edema. Conus medullaris and cauda equina: Conus extends to the T12 level. Conus and cauda equina appear normal. Paraspinal and other soft tissues: Paraspinous soft tissues within normal limits. Few scattered T2 hyperintense cyst noted about the kidneys, largest of which measures 2 cm on the left. These are benign in appearance, with no follow-up imaging recommended. Innumerable cystic lesions noted within the partially visualized liver. Disc levels: L1-2: Disc desiccation with minimal annular bulge. Mild facet hypertrophy. No stenosis. L2-3: Degenerative intervertebral disc space narrowing with circumferential disc bulge and disc desiccation. Mild reactive endplate spurring. Mild bilateral  facet hypertrophy. No significant spinal stenosis. Foramina remain patent. L3-4: Disc desiccation with mild diffuse disc bulge. Mild reactive endplate spurring. Mild facet and ligament flavum hypertrophy. Resultant mild-to-moderate spinal stenosis. Mild bilateral L3 foraminal narrowing. L4-5: Disc desiccation with diffuse disc bulge. Superimposed small right subarticular disc protrusion with slight inferior migration and annular fissure. Protruding disc contacts the descending right L5 nerve root in the right lateral recess (series 12, image 31). Moderate bilateral facet arthrosis. Resultant moderate canal with bilateral subarticular stenosis. Mild bilateral L4 foraminal narrowing. L5-S1:  Negative interspace.  Mild facet hypertrophy.  No stenosis. IMPRESSION: 1. Disc bulge with small right subarticular disc protrusion at L4-5, potentially affecting the descending right L5 nerve root. Moderate canal with mild bilateral L4 foraminal stenosis at this level. 2. Disc bulge with facet hypertrophy at L3-4 with resultant mild to moderate spinal stenosis, with mild bilateral L3 foraminal narrowing. 3. Innumerable cystic lesions within the visualized liver. While these could reflect benign cysts, these are incompletely assessed on this exam. Correlation with dedicated abdominal MRI recommended for further evaluation. Electronically Signed   By: Rise Mu M.D.   On: 12/14/2022 22:30   US ABDOMEN LIMITED RUQ (LIVER/GB)  Result Date: 12/14/2022 CLINICAL DATA:  Right upper quadrant pain and elevated liver function test. EXAM: ULTRASOUND ABDOMEN LIMITED RIGHT UPPER QUADRANT COMPARISON:  None Available. FINDINGS: Gallbladder: Gallbladder is partially contracted. No gallstones or wall thickening visualized (3.3 mm). No sonographic Murphy sign noted by sonographer. Common bile duct: Diameter: 1.6 mm Liver: A 1.2 cm x 1.0 cm x 1.2 cm hypoechoic area is seen within the right lobe of the liver. The hepatic parenchyma is  nodular in contour and coarse in echogenicity. Portal vein is patent on color Doppler imaging with normal direction of blood flow towards the liver. Other: None. IMPRESSION: 1. Hepatic cirrhosis. 2. 1.2 cm x 1.0 cm  x 1.2 cm hypoechoic liver lesion, as described above. MRI correlation is recommended to further exclude the presence of an underlying neoplastic process. Electronically Signed   By: Aram Candela M.D.   On: 12/14/2022 18:10   MR BRAIN WO CONTRAST  Result Date: 12/14/2022 CLINICAL DATA:  Neuro deficit, acute, stroke suspected. Weakness over the last 3 weeks EXAM: MRI HEAD WITHOUT CONTRAST TECHNIQUE: Multiplanar, multiecho pulse sequences of the brain and surrounding structures were obtained without intravenous contrast. COMPARISON:  Head CT same day.  MRI 05/18/2020. FINDINGS: Brain: Diffusion imaging does not show any acute or subacute infarction. Mild chronic small-vessel ischemic change affects pons. 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 Cerebral hemispheres show moderate to marked chronic small-vessel ischemic changes of the white matter, similar to the prior exam. No large vessel territory stroke. No mass lesion, hemorrhage, hydrocephalus or extra-axial collection. 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 Skull and upper cervical spine: Negative Sinuses/Orbits: Clear/normal Other: None IMPRESSION: 1. No acute finding. Moderate to marked chronic small-vessel ischemic changes of the pons and cerebral hemispheric white matter, similar to the study of 05/18/2020. 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 2. 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 Electronically Signed   By: Paulina Fusi M.D.   On: 12/14/2022 13:53   CT Head Wo Contrast  Result Date: 12/14/2022 CLINICAL DATA:  Neuro deficit, acute, stroke suspected. Weakness with  inability to raise the right leg. Recent falls. EXAM: CT HEAD WITHOUT CONTRAST TECHNIQUE: Contiguous axial images were obtained from the base of the skull through the vertex without intravenous contrast. RADIATION DOSE REDUCTION: This exam was performed according to the departmental dose-optimization program which includes automated exposure control, adjustment of the mA and/or kV according to patient size and/or use of iterative reconstruction technique. COMPARISON:  Head and neck CTA 05/19/2020 FINDINGS: Brain: There is no evidence of an acute infarct, intracranial hemorrhage, mass, midline shift, or extra-axial fluid collection. Mild cerebral atrophy is within normal limits for age. Patchy hypodensities in the cerebral white matter are similar to the prior study and are nonspecific but compatible with moderate chronic small vessel ischemic disease. Vascular: Calcified atherosclerosis at the skull base. Known 1.4 cm peripherally calcified right cavernous ICA aneurysm, similar in size to the prior CTA. Skull: No acute fracture or suspicious osseous lesion. Sinuses/Orbits: Visualized paranasal sinuses and mastoid air cells are clear. Unremarkable orbits. Other: None. IMPRESSION: 1. No evidence of acute intracranial abnormality. 2. Moderate chronic small vessel ischemic disease. 3. Known right cavernous ICA aneurysm. Electronically Signed   By: Sebastian Ache M.D.   On: 12/14/2022 11:51   DG Chest Port 1 View  Result Date: 12/14/2022 CLINICAL DATA:  Weakness. EXAM: PORTABLE CHEST 1 VIEW COMPARISON:  Chest radiograph 06/01/2020 and CT 06/09/2020 FINDINGS: The cardiomediastinal silhouette is unchanged with normal heart size. Minor bilateral lung scarring is noted. No acute airspace consolidation, edema, sizable pleural effusion, or pneumothorax is identified. No acute osseous abnormality is seen. IMPRESSION: No active disease. Electronically Signed   By: Sebastian Ache M.D.   On: 12/14/2022 11:46    Stephanie [name]  67 y.o.  female with no prior history of any malignancy with History of CVA x2, type 2 diabetes,-is currently admitted to hospital for worsening right lower extremity weakness. Abdominal imaging/Chest CT findings concerning for malignancy.   # MRI liver- multiple liver lesions. CT scan  chest shows- right breast mass; axillary LN noted- concerning for breast cancer with wide spread metastases.   # Hx of CVA/ with mild residual deficits/ with limited limited mobility-walker.   Plan/Recommendations: # Discussed with the patient that imaging findings concerning for breast malignancy with multiple liver metastases. Recommend biopsy. Will discuss with patient re: IR biopsy of most amenable lesion.   # patient is interested biopsy.  However she wants to wait until her cousins return [currently out of patient] on Monday.  Patient states that she would want [place] to discuss regarding biopsy with her to continue-Sandy and Katrina before ordering the biopsy. For now HOLD off any Asprin or Lovenox 24 hours prior to biopsy.   # Will order breast tumor markers and also coagulation parameters.  Thank you Dr.Kumar for allowing me to participate in the care of your pleasant patient. Please do not hesitate to contact me with questions or concerns in the interim.  Discussed with Dr. Lucianne Muss.   All questions were answered. The patient knows to call the clinic with any problems, questions or concerns.    Earna Coder, MD 12/18/2022 9:32 PM

## 2022-12-19 DIAGNOSIS — R29898 Other symptoms and signs involving the musculoskeletal system: Secondary | ICD-10-CM | POA: Diagnosis not present

## 2022-12-19 LAB — BASIC METABOLIC PANEL WITH GFR
Anion gap: 4 — ABNORMAL LOW (ref 5–15)
BUN: 39 mg/dL — ABNORMAL HIGH (ref 8–23)
CO2: 26 mmol/L (ref 22–32)
Calcium: 9.8 mg/dL (ref 8.9–10.3)
Chloride: 105 mmol/L (ref 98–111)
Creatinine, Ser: 1.58 mg/dL — ABNORMAL HIGH (ref 0.44–1.00)
GFR, Estimated: 36 mL/min — ABNORMAL LOW (ref >60)
Glucose, Bld: 107 mg/dL — ABNORMAL HIGH (ref 70–99)
Potassium: 4.1 mmol/L (ref 3.5–5.1)
Sodium: 135 mmol/L (ref 135–145)

## 2022-12-19 LAB — HEPATIC FUNCTION PANEL
ALT: 40 U/L (ref 0–44)
AST: 88 U/L — ABNORMAL HIGH (ref 15–41)
Albumin: 2.7 g/dL — ABNORMAL LOW (ref 3.5–5.0)
Alkaline Phosphatase: 226 U/L — ABNORMAL HIGH (ref 38–126)
Bilirubin, Direct: 0.4 mg/dL — ABNORMAL HIGH (ref 0.0–0.2)
Indirect Bilirubin: 0.4 mg/dL (ref 0.3–0.9)
Total Bilirubin: 0.8 mg/dL (ref 0.3–1.2)
Total Protein: 6.3 g/dL — ABNORMAL LOW (ref 6.5–8.1)

## 2022-12-19 LAB — CBC
HCT: 28.6 % — ABNORMAL LOW (ref 36.0–46.0)
Hemoglobin: 10.1 g/dL — ABNORMAL LOW (ref 12.0–15.0)
MCH: 27.6 pg (ref 26.0–34.0)
MCHC: 35.3 g/dL (ref 30.0–36.0)
MCV: 78.1 fL — ABNORMAL LOW (ref 80.0–100.0)
Platelets: 351 10*3/uL (ref 150–400)
RBC: 3.66 MIL/uL — ABNORMAL LOW (ref 3.87–5.11)
RDW: 16.7 % — ABNORMAL HIGH (ref 11.5–15.5)
WBC: 12.1 10*3/uL — ABNORMAL HIGH (ref 4.0–10.5)
nRBC: 0.2 % (ref 0.0–0.2)

## 2022-12-19 LAB — MAGNESIUM: Magnesium: 2.2 mg/dL (ref 1.7–2.4)

## 2022-12-19 LAB — PHOSPHORUS: Phosphorus: 2.7 mg/dL (ref 2.5–4.6)

## 2022-12-19 LAB — APTT: aPTT: 42 s — ABNORMAL HIGH (ref 24–36)

## 2022-12-19 MED ORDER — ENOXAPARIN SODIUM 40 MG/0.4ML IJ SOSY: 40.0000 mg | PREFILLED_SYRINGE | INTRAMUSCULAR | Status: DC

## 2022-12-19 MED ORDER — DM-GUAIFENESIN ER 30-600 MG PO TB12: 1.0000 | ORAL_TABLET | Freq: Two times a day (BID) | ORAL | Status: DC | PRN

## 2022-12-19 MED ORDER — BISACODYL 5 MG PO TBEC
10.0000 mg | DELAYED_RELEASE_TABLET | Freq: Once | ORAL | Status: AC
  Administered 2022-12-19: 10 mg via ORAL
  Filled 2022-12-19: qty 2

## 2022-12-19 MED ORDER — BISACODYL 10 MG RE SUPP
10.0000 mg | Freq: Every day | RECTAL | Status: DC | PRN
  Administered 2022-12-20: 10 mg via RECTAL
  Filled 2022-12-19: qty 1

## 2022-12-19 MED ORDER — BISACODYL 5 MG PO TBEC: 10.0000 mg | DELAYED_RELEASE_TABLET | Freq: Every day | ORAL | Status: DC | PRN

## 2022-12-19 MED ORDER — ENOXAPARIN SODIUM 40 MG/0.4ML IJ SOSY
40.0000 mg | PREFILLED_SYRINGE | INTRAMUSCULAR | Status: DC
  Administered 2022-12-21: 40 mg via SUBCUTANEOUS
  Filled 2022-12-19: qty 0.4

## 2022-12-19 MED ORDER — POLYETHYLENE GLYCOL 3350 17 G PO PACK
17.0000 g | PACK | Freq: Two times a day (BID) | ORAL | Status: DC
  Administered 2022-12-19 – 2022-12-21 (×3): 17 g via ORAL
  Filled 2022-12-19 (×4): qty 1

## 2022-12-19 NOTE — Progress Notes (Signed)
[facility] Progress Note  Patient: Stephanie [name]    WJX:[id number]  DOA: 12/14/2022     Date of Service: the patient was seen and examined on 12/19/2022  Chief Complaint  Patient presents with   Weakness   Brief hospital course: TERRIE [name] is a 67 y.o. female with medical history significant of hypertension, tobacco use, hyperlipidemia, CVA, type 2 diabetes, who presents to the ED due to right leg weakness started approximately 3 weeks ago, she had rapid onset right lower extremity weakness.  She feels that her thigh and upper calf are more affected than her feet.  Her symptoms have gradually worsened and led to multiple falls at home.  She denies any loss of consciousness or injuries though.  She denies any focal weakness in any other limb.  She denies any dizziness, vision loss, headache, dysphagia, dysarthria, facial droop.  Patient is admitted to Hospitalist service for further management, imaging rule out stroke. Neurology, Neurosurgery teams consulted for abnormal MRI spine findings. MRI L spine findings does not correlate with her weakness. She does have generalized weakness. Today she did complain of shortness of breath, weakness, cough. Tachycardia, tachypnea noted. Chest Xray, Echo, EKG ordered. Duonebs, Lasix, Mucinex ordered.   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 F/u mammogram GI was consulted for hepatic lesions, recommended further workup of metastatic disease Follow oncologist, recommended biopsy Follow IR for left breast mass biopsy   # Right leg weakness Right lower extremity weakness that began 3 weeks ago.  CT of the head and MRI brain negative for any acute infarct, however moderate to severe chronic small vessel disease.   MRI L spine reviewed  with neurosurgery - not significant to cause symptoms. Neurology evaluation appreciated. Advised MRI C spine given all 4 limbs weak with brisk reflexes. MRI C spine reviewed by neurosurgery advised to get MRI thoracic spine which did not reveal any acute abnormalities. Discussed with neurology team - diffuse weakness likely due to deconditioning. PT advised rehab placement.    Elevated LFTs Cystic liver lesions. RUQ sono shows cirrhosis, 1.2 cm hypoechoic lesion. MRI lumbar spine shows benign cystic lesions of liver though not fully visualized. Hepatitis panel negative. PT/INR wnl. GI evaluation called advised MRI abdomen.  MRI abd: Extensive hepatic metastatic disease, marrow enhancement suspicious for osseous metastasis.  Left breast mass suspicious for metastatic breast malignancy. Follow recommendations. Hold home atorvastatin.   COPD with acute exacerbation  She does have SOB, cough. Chest xray shows no airspace disease. Echo reviewed is unremarkable. Trial of Lasix given. Continuous pulse oximetry. Supplemental oxygen to maintain oxygen saturation above 88%. S/p prednisone 40 mg pod x 5-days, completed course DuoNebs every 6 hours. Mucinex Q12 prn   Acute kidney injury Kidney function improved with IV fluids. Avoid nephrotoxic agents.  Encourage oral diet.   Prediabetes A1c ordered to prior labs. Diet, exercise and weight reduction advised.   Primary hypertension Continue Amlodipine 10 mg p.o. daily and hydralazine 50 p.o. every 8 hourly Monitor BP and titrate medications accordingly    CVA (cerebral vascular accident) (HCC) Continue Aspirin 81 mg daily. Statin held due to abnormal LFT.   Body mass index is 22.13 kg/m.  Interventions:  Diet: Carb modified diet DVT Prophylaxis: Subcutaneous Lovenox   Advance goals of care discussion: DNR  Family  Communication: family was not present at bedside, at the time of interview.  The pt provided permission to  discuss medical plan with the family. Opportunity was given to ask question and all questions were answered satisfactorily.   Disposition:  Pt is from home, admitted with metastatic malignancy most likely left breast cancer, workup pending, which precludes a safe discharge. Discharge to SNF. May need 1-2 more days for further workup. TOC following for discharge planning  Subjective: No significant overnight events, patient denies any shortness of breath, no chest pain or palpitation.  Generalized weakness is getting better, weakness in the right lower extremity is improving.  Denies any new neurological symptoms. Patient agreed for biopsy but she will make a final decision tomorrow a.m.  Physical Exam: General: NAD, lying comfortably Appear in no distress, affect appropriate Eyes: PERRLA ENT: Oral Mucosa Clear, moist  Neck: no JVD,  Cardiovascular: S1 and S2 Present, no Murmur,  Respiratory: good respiratory effort, Bilateral Air entry equal and Decreased, no Crackles, no wheezes Abdomen: Bowel Sound present, Soft and no tenderness,  Skin: no rashes Extremities: no Pedal edema, no calf tenderness Neurologic: without any new focal findings, RLE power 4/5 Gait not checked due to patient safety concerns  Vitals:   12/18/22 1956 12/19/22 0435 12/19/22 0824 12/19/22 1239  BP: 125/71 (!) 140/78 121/67 129/80  Pulse: 92 93 (!) 113 93  Resp: 20 20 18 18   Temp: 98 F (36.7 C) 98 F (36.7 C) 98.1 F (36.7 C) 98 F (36.7 C)  TempSrc: Oral Oral    SpO2: 97% 93% 99% 100%  Weight:      Height:        Intake/Output Summary (Last 24 hours) at 12/19/2022 1323 Last data filed at 12/19/2022 1033 Gross per 24 hour  Intake 642.7 ml  Output --  Net 642.7 ml   Filed Weights   12/14/22 1037  Weight: 74 kg    Data Reviewed: I have personally reviewed and interpreted daily labs, tele strips, imagings as discussed above. I reviewed all nursing notes, pharmacy notes, vitals, pertinent old  records I have discussed plan of care as described above with RN and patient/family.  CBC: Recent Labs  Lab 12/14/22 1049 12/15/22 0222 12/18/22 0456 12/19/22 0427  WBC 9.1 7.2 13.0* 12.1*  HGB 11.7* 11.3* 10.8* 10.1*  HCT 35.7* 34.5* 32.5* 28.6*  MCV 83.2 82.7 80.6 78.1*  PLT 319 310 369 351   Basic Metabolic Panel: Recent Labs  Lab 12/14/22 1049 12/15/22 0222 12/17/22 0325 12/18/22 0456 12/19/22 0427  NA 136 135 134* 136 135  K 3.4* 3.7 3.8 3.9 4.1  CL 100 105 103 102 105  CO2 21* 20* 24 23 26   GLUCOSE 103* 125* 123* 114* 107*  BUN 29* 26* 28* 38* 39*  CREATININE 1.45* 1.16* 1.29* 1.53* 1.58*  CALCIUM 10.6* 10.2 10.1 10.4* 9.8  MG  --   --   --   --  2.2  PHOS  --   --   --   --  2.7    Studies: No results found.  Scheduled Meds:  amLODipine  10 mg Oral Daily   [START ON 12/21/2022] enoxaparin (LOVENOX) injection  40 mg Subcutaneous Q24H   hydrALAZINE  50 mg Oral Q8H   multivitamin with minerals  1 tablet Oral Daily   pantoprazole  20 mg Oral BID   sodium chloride flush  3 mL Intravenous Q12H   Continuous Infusions:   PRN Meds: acetaminophen **OR** acetaminophen, dextromethorphan-guaiFENesin, ipratropium-albuterol,  ondansetron **OR** ondansetron (ZOFRAN) IV, polyethylene glycol  Time spent: 35 minutes  Author: Gillis Santa. MD [facility] 12/19/2022 1:23 PM  To reach On-call, see care teams to locate the attending and reach out to them via [email or web address]. If 7PM-7AM, please contact night-coverage If you still have difficulty reaching the attending provider, please page the [facility] (Director on Call) for [facility] on amion for assistance.

## 2022-12-19 NOTE — Progress Notes (Signed)
Mobility Specialist - Progress Note     12/19/22 0905  Mobility  Activity Ambulated with assistance in hallway  Level of Assistance Standby assist, set-up cues, supervision of patient - no hands on  Assistive Device Front wheel walker  Distance Ambulated (ft) 80 ft  Range of Motion/Exercises Active  Activity Response Tolerated well  Mobility Referral Yes  $Mobility charge 1 Mobility  Mobility Specialist Start Time (ACUTE ONLY) 0844  Mobility Specialist Stop Time (ACUTE ONLY) 0900  Mobility Specialist Time Calculation (min) (ACUTE ONLY) 16 min   Pt resting in bed on RA upon entry. Pt STS and ambulates to hallway up to NS with RW SBA. Pt endorses no pain, dizziness, or SOB during ambulation. Pt returns to supine in bed and left with needs in reach.   Johnathan Hausen Mobility Specialist 12/19/22, 9:10 AM

## 2022-12-19 NOTE — TOC Progression Note (Signed)
Transition of Care [facility]) - Progression Note    Patient Details  Name: Stephanie [name] MRN: [id number] Date of Birth: 04/05/1956  Transition of Care [facility]) CM/SW Contact  Susa Simmonds, [place] Phone Number: 12/19/2022, 3:03 PM  Clinical Narrative:   Patient is not stable for discharge at this time. Patient has an anticipated discharge date for 12/20/22 or 12/21/22.     Expected Discharge Plan: Skilled Nursing Facility Barriers to Discharge: Continued Medical Work up  Expected Discharge Plan and Services     Post Acute Care Choice: Skilled Nursing Facility Living arrangements for the past 2 months: Single Family Home                                       Social Determinants of Health (SDOH) Interventions SDOH Screenings   Food Insecurity: No Food Insecurity (12/15/2022)  Housing: Low Risk  (12/15/2022)  Transportation Needs: No Transportation Needs (12/15/2022)  Utilities: Not At Risk (12/15/2022)  Tobacco Use: Medium Risk (12/15/2022)    Readmission Risk Interventions     No data to display

## 2022-12-19 NOTE — Progress Notes (Signed)
   12/19/22 0824  Assess: MEWS Score  Temp 98.1 F (36.7 C)  BP 121/67  MAP (mmHg) 79  Pulse Rate (!) 113  Resp 18  SpO2 99 %  O2 Device Room Air  Assess: MEWS Score  MEWS Temp 0  MEWS Systolic 0  MEWS Pulse 2  MEWS RR 0  MEWS LOC 0  MEWS Score 2  MEWS Score Color Yellow  Assess: if the MEWS score is Yellow or Red  Were vital signs accurate and taken at a resting state? Yes  Does the patient meet 2 or more of the SIRS criteria? No  MEWS guidelines implemented  Yes, yellow  Treat  MEWS Interventions Considered administering scheduled or prn medications/treatments as ordered  Take Vital Signs  Increase Vital Sign Frequency  Yellow: Q2hr x1, continue Q4hrs until patient remains green for 12hrs  Escalate  MEWS: Escalate Yellow: Discuss with charge nurse and consider notifying provider and/or RRT  Notify: Charge Nurse/RN  Name of Charge Nurse/RN Psychologist, occupational  Assess: SIRS CRITERIA  SIRS Temperature  0  SIRS Pulse 1  SIRS Respirations  0  SIRS WBC 0  SIRS Score Sum  1

## 2022-12-20 ENCOUNTER — Inpatient Hospital Stay: Payer: 59

## 2022-12-20 ENCOUNTER — Inpatient Hospital Stay: Payer: 59 | Admitting: Radiology

## 2022-12-20 DIAGNOSIS — Z515 Encounter for palliative care: Secondary | ICD-10-CM | POA: Diagnosis not present

## 2022-12-20 DIAGNOSIS — R29898 Other symptoms and signs involving the musculoskeletal system: Secondary | ICD-10-CM | POA: Diagnosis not present

## 2022-12-20 DIAGNOSIS — R160 Hepatomegaly, not elsewhere classified: Secondary | ICD-10-CM

## 2022-12-20 HISTORY — PX: IR US LIVER BIOPSY: IMG936

## 2022-12-20 LAB — CANCER ANTIGEN 15-3: CA 15-3: 323 U/mL — ABNORMAL HIGH (ref 0.0–25.0)

## 2022-12-20 LAB — CEA: CEA: 1467 ng/mL — ABNORMAL HIGH (ref 0.0–4.7)

## 2022-12-20 MED ORDER — LIDOCAINE 1 % OPTIME INJ - NO CHARGE
10.0000 mL | Freq: Once | INTRAMUSCULAR | Status: AC
  Administered 2022-12-20: 10 mL via INTRADERMAL

## 2022-12-20 MED ORDER — MIDAZOLAM HCL 2 MG/2ML IJ SOLN
INTRAMUSCULAR | Status: AC | PRN
  Administered 2022-12-20: 1 mg via INTRAVENOUS

## 2022-12-20 MED ORDER — GELATIN ABSORBABLE 12-7 MM EX MISC
1.0000 | Freq: Once | CUTANEOUS | Status: AC
  Administered 2022-12-20: 1 via TOPICAL
  Filled 2022-12-20: qty 1

## 2022-12-20 MED ORDER — LORAZEPAM 2 MG/ML IJ SOLN: 2.0000 mg | Freq: Once | INTRAMUSCULAR | Status: DC | PRN

## 2022-12-20 MED ORDER — ALPRAZOLAM 0.5 MG PO TABS
0.5000 mg | ORAL_TABLET | Freq: Three times a day (TID) | ORAL | Status: DC | PRN
  Administered 2022-12-20: 0.5 mg via ORAL
  Filled 2022-12-20: qty 1

## 2022-12-20 MED ORDER — MIDAZOLAM HCL 2 MG/2ML IJ SOLN
INTRAMUSCULAR | Status: AC
  Filled 2022-12-20: qty 2

## 2022-12-20 MED ORDER — FENTANYL CITRATE (PF) 100 MCG/2ML IJ SOLN
INTRAMUSCULAR | Status: AC | PRN
  Administered 2022-12-20: 50 ug via INTRAVENOUS

## 2022-12-20 MED ORDER — FENTANYL CITRATE (PF) 100 MCG/2ML IJ SOLN
INTRAMUSCULAR | Status: AC
  Filled 2022-12-20: qty 2

## 2022-12-20 NOTE — Progress Notes (Signed)
   12/20/22 1600  Spiritual Encounters  Type of Visit Initial  Care provided to: Pt and family  Referral source Patient request;Family  Reason for visit Advance directives  OnCall Visit Yes  Spiritual Framework  Presenting Themes Courage hope and growth;Impactful experiences and emotions  Community/Connection Family  Patient Stress Factors Health changes  Family Stress Factors Health changes  Intervention Outcomes  Outcomes Autonomy/agency;Awareness of health;Patient family open to resources  Spiritual Care Plan  Spiritual Care Issues Still Outstanding No further spiritual care needs at this time (see row info)   Chaplain received spiritual consult for an advance directive. Educated patient and her daughter about the advance directive. Chaplain services is available for spiritual and emotional support as needed.

## 2022-12-20 NOTE — Progress Notes (Signed)
Mobility Specialist - Progress Note   12/20/22 1600  Mobility  Activity Refused mobility     Pt politely declined mobility, reports wanting to take today to rest as she had little sleep last night and a busy day today. Will attempt another date/time. Family at bedside.   Filiberto Pinks Mobility Specialist 12/20/22, 4:28 PM

## 2022-12-20 NOTE — Progress Notes (Signed)
Vascular and Interventional Radiology  PRE PROCEDURE Note  Assessment  Plan:   Stephanie [name] is a 67 y.o. year old female who will undergo LIVER MASS BIOPSY in Interventional Radiology.  The procedure has been fully reviewed with the patient/patient's authorized representative. The risks, benefits and alternatives have been explained, and the patient/patient's authorized representative has consented to the procedure.  HPI: Ms. Stephanie [name] is a 83 y.o. year old female w PMHx significant for smoking, HTN, T2DM and prior reported CVA x2. Pt presented to [facility] ER w weakness with workup revealing transaminitis (> ALP) and [place] RUQ with liver lesions. CT Chest showed a R breast mass and R axillary LND. No prior malignant Hx. VIR consulted for tissue biopsy.   Imaging independently reviewed, demonstrating multifocal liver masses. Liver mass biopsy recommended.    Review of Systems: A 12-point ROS discussed, and pertinent positives are indicated in the HPI above Informed consent was obtained, witnessed and placed in the patient's chart.  Allergies:  Allergies  Allergen Reactions   Codeine Other (See Comments)    hallucinations   Lisinopril Swelling    Medications:  No current facility-administered medications on file prior to encounter.   Current Outpatient Medications on File Prior to Encounter  Medication Sig Dispense Refill   amLODipine (NORVASC) 10 MG tablet TAKE ONE TABLET BY MOUTH ONCE EVERY DAY FOR HIGH BLOOD PRESSURE 90 tablet 2   atorvastatin (LIPITOR) 40 MG tablet TAKE ONE TABLET BY MOUTH ONCE EVERY NIGHT FOR HIGH CHOLESTEROL 90 tablet 2   hydrALAZINE (APRESOLINE) 50 MG tablet TAKE ONE TABLET BY MOUTH 3 TIMES A DAY 270 tablet 2   pantoprazole (PROTONIX) 40 MG tablet TAKE ONE TABLET BY MOUTH 2 TIMES A DAY 180 tablet 0   clopidogrel (PLAVIX) 75 MG tablet Take 1 tablet (75 mg total) by mouth daily. Hold this medication until you see your PCP. (Patient not taking: Reported on  02/24/2021) 30 tablet 2   clopidogrel (PLAVIX) 75 MG tablet TAKE ONE TABLET BY MOUTH ONCE EVERY DAY. (Patient not taking: Reported on 12/14/2022) 90 tablet 3   hydrALAZINE (APRESOLINE) 25 MG tablet Take 1 tablet (25 mg total) by mouth 2 (two) times daily. (Patient not taking: Reported on 02/24/2021) 60 tablet 2   Ipratropium-Albuterol (COMBIVENT) 20-100 MCG/ACT AERS respimat Inhale 1 puff into the lungs every 6 (six) hours. (Patient not taking: Reported on 02/24/2021) 1 each 0   pantoprazole (PROTONIX) 40 MG tablet Take 1 tablet (40 mg total) by mouth 2 (two) times daily. (Patient not taking: Reported on 12/14/2022) 60 tablet 0   [DISCONTINUED] lisinopril (ZESTRIL) 5 MG tablet TAKE ONE TABLET BY MOUTH EVERY DAY (Patient not taking: Reported on 02/24/2021) 30 tablet 2    PSH:  Past Surgical History:  Procedure Laterality Date   ABDOMINAL HYSTERECTOMY     at 67 years old   CLOSED REDUCTION MANDIBLE Bilateral 05/23/2020   Procedure: CLOSED REDUCTION MANDIBULAR;  Surgeon: Vernie Murders, MD;  Location: [facility] ORS;  Service: ENT;  Laterality: Bilateral;   CLOSED REDUCTION MANDIBLE N/A 06/03/2020   Procedure: CLOSED REDUCTION MANDIBULAR;  Surgeon: Linus Salmons, MD;  Location: [facility] ORS;  Service: ENT;  Laterality: N/A;   COLONOSCOPY WITH PROPOFOL N/A 06/10/2020   Procedure: COLONOSCOPY WITH PROPOFOL;  Surgeon: Pasty Spillers, MD;  Location: [facility] ENDOSCOPY;  Service: Endoscopy;  Laterality: N/A;    PMH:  Past Medical History:  Diagnosis Date   Diabetes mellitus without complication (HCC)    Hypertension  Stroke (HCC) 05/17/2020    Brief Physical Examination: Vitals:   12/20/22 0347 12/20/22 0817  BP: (!) 142/78 136/76  Pulse: (!) 107 (!) 107  Resp: 18 18  Temp: 98.1 F (36.7 C) 98 F (36.7 C)  SpO2: 99% 99%   General: WD, WN female in NAD HEENT: Normocephalic, atraumatic Lungs: Respirations non-labored  ASA Grade: 3 Mallampati Class: 3   [name], MD Vascular and  Interventional Radiology [facility]   Pager. [phone] Clinic. [phone]  I spent a total of 25 Minutes of face-to-face time in clinical consultation, greater than 50% of which was counseling/coordinating care for Ms Ludell C Wyles's evaluation for liver mass biopsy.

## 2022-12-20 NOTE — Progress Notes (Signed)
OT Cancellation Note  Patient Details Name: Stephanie [name] MRN: [id number] DOB: Oct 11, 1955   Cancelled Treatment:    Reason Eval/Treat Not Completed: Fatigue/lethargy limiting ability to participate;Other (comment). Pt in bathroom with NT upon arrival, OT assists with return back to bed. Pt refusing further mobility or OT at this time, requesting lunch. Will check back as able.   Lise Auer  12/20/2022, 11:47 AM

## 2022-12-20 NOTE — Progress Notes (Signed)
TENNILLE [name]   DOB:1956/02/21   UV#:[id number]    Subjective: pt waiting for a liver biopsy. States she is hungry- waiting for biopsy.   Objective:  Vitals:   12/20/22 1128 12/20/22 1226  BP: 136/73 135/87  Pulse: 97 96  Resp: 18 18  Temp: 98.1 F (36.7 C)   SpO2: 97% 98%     Intake/Output Summary (Last 24 hours) at 12/20/2022 1232 Last data filed at 12/19/2022 1451 Gross per 24 hour  Intake 240 ml  Output --  Net 240 ml   Chronic Right lower extremity weakness.  Physical Exam Vitals and nursing note reviewed.  Constitutional:      Comments:     HENT:     Head: Normocephalic and atraumatic.     Mouth/Throat:     Mouth: Mucous membranes are moist.     Pharynx: No oropharyngeal exudate.  Eyes:     Pupils: Pupils are equal, round, and reactive to light.  Cardiovascular:     Rate and Rhythm: Normal rate and regular rhythm.  Pulmonary:     Effort: No respiratory distress.     Breath sounds: No wheezing.     Comments: Decreased breath sounds bilaterally at bases.  No wheeze or crackles Abdominal:     General: Bowel sounds are normal. There is no distension.     Palpations: Abdomen is soft. There is no mass.     Tenderness: There is no abdominal tenderness. There is no guarding or rebound.  Musculoskeletal:        General: No tenderness. Normal range of motion.     Cervical back: Normal range of motion and neck supple.  Skin:    General: Skin is warm.  Neurological:     Mental Status: She is alert and oriented to person, place, and time.  Psychiatric:        Mood and Affect: Affect normal.        Judgment: Judgment normal.      Labs:  Lab Results  Component Value Date   WBC 11.8 (H) 12/20/2022   HGB 10.2 (L) 12/20/2022   HCT 29.9 (L) 12/20/2022   MCV 78.7 (L) 12/20/2022   PLT 392 12/20/2022   NEUTROABS 5.6 06/07/2020    Lab Results  Component Value Date   NA 137 12/20/2022   K 4.2 12/20/2022   CL 104 12/20/2022   CO2 23 12/20/2022    Studies:  US  Abdomen Limited RUQ (LIVER/GB)  Result Date: 12/20/2022 CLINICAL DATA:  [id number] Liver mass [id number] EXAM: ULTRASOUND ABDOMEN LIMITED RIGHT UPPER QUADRANT COMPARISON:  CT chest, 12/18/2022. MR abdomen, 12/18/2022. US Abdomen, 12/14/2022. FINDINGS: Gallbladder: No gallstones or wall thickening visualized. No sonographic Murphy sign noted by sonographer. Common bile duct: Diameter: 0.5 cm Liver: Nodular contour liver. Heterogeneous hepatic parenchymal echogenicity, with innumerable liver lesions scattered within the RIGHT and LEFT lobes of the liver. Single largest lesion identified within the RIGHT lobe measures up to 2.7 cm. Portal vein is patent on color Doppler imaging with normal direction of blood flow towards the liver. Other: No perihepatic ascites. IMPRESSION: 1. Innumerable lesions, scattered within the liver. 2. No biliary ductal dilatation. Electronically Signed   By: [name] M.D.   On: 12/20/2022 12:10    Stephanie [name] 67 y.o.  female with no prior history of any malignancy with History of CVA x2, type 2 diabetes,-is currently admitted to hospital for worsening right lower extremity weakness. Abdominal imaging/Chest CT findings concerning for malignancy.    #  MRI liver- multiple liver lesions. CT scan chest shows- LEFT breast mass; axillary LN noted- concerning for breast cancer with wide spread metastases- currently awaiting liver this afternoon. Cancer markers pending.    # Hx of CVA/ with mild residual deficits/ with limited limited mobility-walker.   # DNR/DNI- recommend palliative care. Discussed with [employer].  # 40 minutes face-to-face with the patient discussing the above plan of care; more than 50% of time spent on prognosis/ natural history; counseling and coordination.   Earna Coder, MD 12/20/2022  12:32 PM

## 2022-12-20 NOTE — Plan of Care (Signed)
The patient is stable at this time. Liver biopsy was obtained. The patient had a bowel movement today. Call bell at reach. 1 person assist with a walker. The patient had a bowel movement today.  Problem: Education: Goal: Knowledge of General Education information will improve Description: Including pain rating scale, medication(s)/side effects and non-pharmacologic comfort measures Outcome: Progressing   Problem: Health Behavior/Discharge Planning: Goal: Ability to manage health-related needs will improve Outcome: Progressing   Problem: Clinical Measurements: Goal: Ability to maintain clinical measurements within normal limits will improve Outcome: Progressing Goal: Will remain free from infection Outcome: Progressing Goal: Diagnostic test results will improve Outcome: Progressing Goal: Respiratory complications will improve Outcome: Progressing Goal: Cardiovascular complication will be avoided Outcome: Progressing   Problem: Activity: Goal: Risk for activity intolerance will decrease Outcome: Progressing   Problem: Nutrition: Goal: Adequate nutrition will be maintained Outcome: Progressing   Problem: Coping: Goal: Level of anxiety will decrease Outcome: Progressing   Problem: Elimination: Goal: Will not experience complications related to bowel motility Outcome: Progressing Goal: Will not experience complications related to urinary retention Outcome: Progressing   Problem: Pain Managment: Goal: General experience of comfort will improve Outcome: Progressing   Problem: Safety: Goal: Ability to remain free from injury will improve Outcome: Progressing   Problem: Skin Integrity: Goal: Risk for impaired skin integrity will decrease Outcome: Progressing

## 2022-12-20 NOTE — Procedures (Signed)
Vascular and Interventional Radiology Procedure Note  Patient: TAUHEEDAH PACYNA DOB: [birth year] Medical Record Number: [id number] Note Date/Time: 12/20/22 12:06 PM   Performing Physician: [name], MD Assistant(s): None  Diagnosis: Liver mass. No DX   Procedure: LIVER MASS BIOPSY  Anesthesia: Conscious Sedation Complications: None Estimated Blood Loss: Minimal Specimens: Sent for Pathology  Findings:  Successful Ultrasound-guided biopsy of liver mass. A total of 3 samples were obtained. Hemostasis of the tract was achieved using Gelfoam Slurry Embolization.  Plan: Bed rest for 2 hours.  See detailed procedure note with images in PACS. The patient tolerated the procedure well without incident or complication and was returned to Recovery in stable condition.    [name], MD Vascular and Interventional Radiology Specialists [facility]   Pager. [phone] Clinic. [phone]

## 2022-12-20 NOTE — Progress Notes (Signed)
Patient clinically stable post IR Liver biopsy per Dr Milford Cage, tolerated well. Vitals stable pre and post procedure. Received Versed 1 mg along with Fentanyl 50 mcg IV for procedure. Report given to care nurse post procedure/recovery.

## 2022-12-20 NOTE — Care Management Important Message (Signed)
Important Message  Patient Details  Name: Stephanie [name] MRN: [id number] Date of Birth: 09-Sep-1955   Medicare Important Message Given:  Yes  Patient out of room for procedure during time of visit, no family in room.  Copy of Medicare IM left in room for reference.   Johnell Comings 12/20/2022, 11:03 AM

## 2022-12-20 NOTE — Plan of Care (Signed)
  Problem: Health Behavior/Discharge Planning: Goal: Ability to manage health-related needs will improve Outcome: Progressing   Problem: Clinical Measurements: Goal: Will remain free from infection Outcome: Progressing   Problem: Clinical Measurements: Goal: Diagnostic test results will improve Outcome: Progressing   Problem: Activity: Goal: Risk for activity intolerance will decrease Outcome: Progressing   Problem: Nutrition: Goal: Adequate nutrition will be maintained Outcome: Progressing   Problem: Coping: Goal: Level of anxiety will decrease Outcome: Progressing

## 2022-12-20 NOTE — TOC Progression Note (Addendum)
Transition of Care [facility]) - Progression Note    Patient Details  Name: Stephanie [name] MRN: [id number] Date of Birth: 1955-11-07  Transition of Care [facility]) CM/SW Contact  Truddie Hidden, RN Phone Number: 12/20/2022, 3:14 PM  Clinical Narrative:    Sherron Monday with Kenney Houseman in admissions at [facility]. Discharge summary not yet received. Patient can be accepted tomorrow. MD notified. Spoke with patient and family regarding discharge planned for tomorrow.     Expected Discharge Plan: Skilled Nursing Facility Barriers to Discharge: Continued Medical Work up  Expected Discharge Plan and Services     Post Acute Care Choice: Skilled Nursing Facility Living arrangements for the past 2 months: Single Family Home                                       Social Determinants of Health (SDOH) Interventions SDOH Screenings   Food Insecurity: No Food Insecurity (12/15/2022)  Housing: Low Risk  (12/15/2022)  Transportation Needs: No Transportation Needs (12/15/2022)  Utilities: Not At Risk (12/15/2022)  Tobacco Use: Medium Risk (12/15/2022)    Readmission Risk Interventions     No data to display

## 2022-12-20 NOTE — Consult Note (Signed)
Palliative Medicine [facility] at [facility] [phone] [phone]   Name: Stephanie [name] Date: 12/20/2022 MRN: [id number]  DOB: [birth year]  Patient Care Team: Center, [facility] as PCP - General (General Practice) Lorn Junes, FNP (Family Medicine) [place], Carlye Grippe, RN    REASON FOR CONSULTATION: Stephanie [name] is a 67 y.o. female with multiple medical problems including hypertension, hyperlipidemia, diabetes, previous CVA, who presented to the hospital with right leg weakness.  She was found to have hepatic and osseous metastasis on CT as well as a left breast mass and lung nodules.  Palliative care was consulted to address goals.  SOCIAL HISTORY:     reports that she quit smoking about 2 years ago. Her smoking use included cigarettes. She has never used smokeless tobacco. She reports that she does not drink alcohol and does not use drugs.  Patient never married and has no children.  She lives at home with her brother.  She has a cousin who was involved.  ADVANCE DIRECTIVES:  Does not have  CODE STATUS: DNR  PAST MEDICAL HISTORY: Past Medical History:  Diagnosis Date   Diabetes mellitus without complication (HCC)    Hypertension    Stroke (HCC) 05/17/2020    PAST SURGICAL HISTORY:  Past Surgical History:  Procedure Laterality Date   ABDOMINAL HYSTERECTOMY     at 67 years old   CLOSED REDUCTION MANDIBLE Bilateral 05/23/2020   Procedure: CLOSED REDUCTION MANDIBULAR;  Surgeon: Vernie Murders, MD;  Location: [facility] ORS;  Service: ENT;  Laterality: Bilateral;   CLOSED REDUCTION MANDIBLE N/A 06/03/2020   Procedure: CLOSED REDUCTION MANDIBULAR;  Surgeon: Linus Salmons, MD;  Location: [facility] ORS;  Service: ENT;  Laterality: N/A;   COLONOSCOPY WITH PROPOFOL N/A 06/10/2020   Procedure: COLONOSCOPY WITH PROPOFOL;  Surgeon: Pasty Spillers, MD;  Location: [facility] ENDOSCOPY;  Service: Endoscopy;  Laterality:  N/A;   IR US LIVER BIOPSY  12/20/2022    HEMATOLOGY/ONCOLOGY HISTORY:  Oncology History   No history exists.    ALLERGIES:  is allergic to codeine and lisinopril.  MEDICATIONS:  Current Facility-Administered Medications  Medication Dose Route Frequency Provider Last Rate Last Admin   acetaminophen (TYLENOL) tablet 650 mg  650 mg Oral Q6H PRN Verdene Lennert, MD   650 mg at 12/17/22 0154   Or   acetaminophen (TYLENOL) suppository 650 mg  650 mg Rectal Q6H PRN Verdene Lennert, MD       ALPRAZolam Prudy Feeler) tablet 0.5 mg  0.5 mg Oral TID PRN Gillis Santa, MD   0.5 mg at 12/20/22 0903   amLODipine (NORVASC) tablet 10 mg  10 mg Oral Daily Verdene Lennert, MD   10 mg at 12/20/22 6440   bisacodyl (DULCOLAX) EC tablet 10 mg  10 mg Oral Daily PRN Gillis Santa, MD       bisacodyl (DULCOLAX) suppository 10 mg  10 mg Rectal Daily PRN Gillis Santa, MD   10 mg at 12/20/22 0904   dextromethorphan-guaiFENesin (MUCINEX DM) 30-600 MG per 12 hr tablet 1 tablet  1 tablet Oral BID PRN Gillis Santa, MD       [START ON 12/21/2022] enoxaparin (LOVENOX) injection 40 mg  40 mg Subcutaneous Q24H Gillis Santa, MD       hydrALAZINE (APRESOLINE) tablet 50 mg  50 mg Oral Q8H Gillis Santa, MD   50 mg at 12/20/22 1432   ipratropium-albuterol (DUONEB) 0.5-2.5 (3) MG/3ML nebulizer solution 3 mL  3 mL Nebulization Q4H PRN  Marcelino Duster, MD       LORazepam (ATIVAN) injection 2 mg  2 mg Intravenous Once PRN Gillis Santa, MD       multivitamin with minerals tablet 1 tablet  1 tablet Oral Daily Otelia Sergeant, RPH   1 tablet at 12/20/22 0904   ondansetron (ZOFRAN) tablet 4 mg  4 mg Oral Q6H PRN Verdene Lennert, MD       Or   ondansetron (ZOFRAN) injection 4 mg  4 mg Intravenous Q6H PRN Verdene Lennert, MD   4 mg at 12/17/22 0142   pantoprazole (PROTONIX) EC tablet 20 mg  20 mg Oral BID Verdene Lennert, MD   20 mg at 12/20/22 0903   polyethylene glycol (MIRALAX / GLYCOLAX) packet 17 g  17 g Oral BID Gillis Santa,  MD   17 g at 12/19/22 2124   sodium chloride flush (NS) 0.9 % injection 3 mL  3 mL Intravenous Q12H Verdene Lennert, MD   3 mL at 12/20/22 0905    VITAL SIGNS: BP 112/74   Pulse (!) 104   Temp 98 F (36.7 C) (Oral)   Resp 16   Ht 6' (1.829 m)   Wt 163 lb 2.3 oz (74 kg)   SpO2 94%   BMI 22.13 kg/m  Filed Weights   12/14/22 1037  Weight: 163 lb 2.3 oz (74 kg)    Estimated body mass index is 22.13 kg/m as calculated from the following:   Height as of this encounter: 6' (1.829 m).   Weight as of this encounter: 163 lb 2.3 oz (74 kg).  LABS: CBC:    Component Value Date/Time   WBC 11.8 (H) 12/20/2022 0421   HGB 10.2 (L) 12/20/2022 0421   HCT 29.9 (L) 12/20/2022 0421   PLT 392 12/20/2022 0421   MCV 78.7 (L) 12/20/2022 0421   NEUTROABS 5.6 06/07/2020 0610   LYMPHSABS 1.1 06/07/2020 0610   MONOABS 0.9 06/07/2020 0610   EOSABS 0.2 06/07/2020 0610   BASOSABS 0.0 06/07/2020 0610   Comprehensive Metabolic Panel:    Component Value Date/Time   NA 137 12/20/2022 0421   K 4.2 12/20/2022 0421   CL 104 12/20/2022 0421   CO2 23 12/20/2022 0421   BUN 37 (H) 12/20/2022 0421   CREATININE 1.51 (H) 12/20/2022 0421   GLUCOSE 99 12/20/2022 0421   CALCIUM 10.3 12/20/2022 0421   AST 119 (H) 12/20/2022 0421   ALT 51 (H) 12/20/2022 0421   ALKPHOS 251 (H) 12/20/2022 0421   BILITOT 0.8 12/20/2022 0421   PROT 6.5 12/20/2022 0421   ALBUMIN 2.9 (L) 12/20/2022 0421    RADIOGRAPHIC STUDIES: IR US LIVER BIOPSY  Result Date: 12/20/2022 INDICATION: Liver masses.  No diagnosis. EXAM: ULTRASOUND GUIDED LIVER MASS BIOPSY COMPARISON:  MRI abdomen 12/18/2022 MEDICATIONS: None ANESTHESIA/SEDATION: Moderate (conscious) sedation was employed during this procedure. A total of Versed 1 mg and Fentanyl 50 mcg was administered intravenously. Moderate Sedation Time: 10 minutes. The patient's level of consciousness and vital signs were monitored continuously by radiology nursing throughout the procedure under  my direct supervision. COMPLICATIONS: None immediate. PROCEDURE: Informed written consent was obtained from insufficient wrap after a discussion of the risks, benefits and alternatives to treatment. The patient understands and consents the procedure. A timeout was performed prior to the initiation of the procedure. Ultrasound scanning was performed of the upper abdominal quadrant demonstrates tumor replacement of the LEFT hepatic lobe The LEFT hepatic lobe was selected for biopsy and the procedure was planned. The  upper abdominal quadrant was prepped and draped in the usual sterile fashion. The overlying soft tissues were anesthetized with 1% lidocaine. A 17 gauge, 6.8 cm co-axial needle was advanced into a peripheral aspect of the lesion. This was followed by 4 core biopsies with an 18 gauge core device under direct ultrasound guidance. The coaxial needle tract was embolized with a small amount of Gel-Foam slurry and superficial hemostasis was obtained with manual compression. Post procedural scanning was negative for definitive area of hemorrhage or additional complication. A dressing was placed. The patient tolerated the procedure well without immediate post procedural complication. IMPRESSION: Successful ultrasound guided core needle biopsy of liver mass. [name], MD [facility] Electronically Signed   By: [name] M.D.   On: 12/20/2022 13:26   US Abdomen Limited RUQ (LIVER/GB)  Result Date: 12/20/2022 CLINICAL DATA:  [id number] Liver mass [id number] EXAM: ULTRASOUND ABDOMEN LIMITED RIGHT UPPER QUADRANT COMPARISON:  CT chest, 12/18/2022. MR abdomen, 12/18/2022. US Abdomen, 12/14/2022. FINDINGS: Gallbladder: No gallstones or wall thickening visualized. No sonographic Murphy sign noted by sonographer. Common bile duct: Diameter: 0.5 cm Liver: Nodular contour liver. Heterogeneous hepatic parenchymal echogenicity, with innumerable liver lesions scattered  within the RIGHT and LEFT lobes of the liver. Single largest lesion identified within the RIGHT lobe measures up to 2.7 cm. Portal vein is patent on color Doppler imaging with normal direction of blood flow towards the liver. Other: No perihepatic ascites. IMPRESSION: 1. Innumerable lesions, scattered within the liver. 2. No biliary ductal dilatation. Electronically Signed   By: [name] M.D.   On: 12/20/2022 12:10   CT CHEST WO CONTRAST  Result Date: 12/18/2022 CLINICAL DATA:  Evaluate for occult malignancy EXAM: CT CHEST WITHOUT CONTRAST TECHNIQUE: Multidetector CT imaging of the chest was performed following the standard protocol without IV contrast. RADIATION DOSE REDUCTION: This exam was performed according to the departmental dose-optimization program which includes automated exposure control, adjustment of the mA and/or kV according to patient size and/or use of iterative reconstruction technique. COMPARISON:  06/09/2020 FINDINGS: Cardiovascular: Scattered calcifications are seen in thoracic aorta. There is ectasia of right main pulmonary artery measuring 3.4 cm. Mediastinum/Nodes: There is 1.8 x 1.2 cm lymph node at the right cardiophrenic angle. There are enlarged lymph nodes in left axilla measuring up to 1.4 cm in short axis. There is 4.5 x 3.4 cm soft tissue mass in the upper aspect of left breast. There is skin thickening adjacent to this mass. Lungs/Pleura: Centrilobular emphysema is seen. Small linear patchy infiltrate is seen in right lower lobe. Increased markings are seen in the posterior segment of right upper lobe. In image 59 of series 4, there is 3 mm pleural-based nodule in left upper lobe. In image 50, there is a 2 mm nodule in the lateral aspect of right upper lobe. In image 68, there is a 3 mm nodule inseparable from the posterior aspect of minor fissure. There is no pleural effusion or pneumothorax. Upper Abdomen: Lobulations are seen in the margin of the liver. There are multiple  low-density space-occupying lesions suggesting hepatic metastatic disease. There is 2 mm calcific density in the upper pole of left kidney, possibly left renal stone. Musculoskeletal: No focal lytic or sclerotic lesions are seen. IMPRESSION: There is 4.5 x 3.4 cm new soft tissue mass in the upper portion of left breast. This may suggest primary or metastatic malignant neoplasm. There are abnormally enlarged lymph nodes in left axilla suggesting metastatic lymphadenopathy. There is 1.8  x 1.2 cm lymph node at the right cardiophrenic angle suggesting possible metastatic lymphadenopathy. PET-CT and tissue sampling may be considered. There are a few scattered tiny nodular densities in both lungs measuring up to 3 mm. This finding may suggest incidental granulomas or early pulmonary metastatic disease. There are space-occupying lesions in liver suggesting hepatic metastatic disease. Liver margin is lobulated suggesting possible cirrhosis. There is 2 mm left renal calculus. Electronically Signed   By: Ernie Avena M.D.   On: 12/18/2022 11:29   MR ABDOMEN W WO CONTRAST  Result Date: 12/18/2022 CLINICAL DATA:  Liver lesions, cirrhosis EXAM: MRI ABDOMEN WITHOUT AND WITH CONTRAST TECHNIQUE: Multiplanar multisequence MR imaging of the abdomen was performed both before and after the administration of intravenous contrast. CONTRAST:  7mL GADAVIST GADOBUTROL 1 MMOL/ML IV SOLN COMPARISON:  MR thoracic spine, 12/17/2022, CT chest abdomen pelvis, 06/09/2020 FINDINGS: Lower chest: No acute abnormality. Hepatobiliary: Hepatomegaly, maximum coronal span 23.0 cm. Coarse, nodular contour of the liver. Very extensive, nearly confluent masslike and nodular signal abnormality throughout the liver (series 4, image 17). Although multiphasic contrast enhanced sequences are significantly limited by breath motion artifact, these lesions are mostly isoenhancing to liver parenchyma, however some are hypoenhancing, for example in the  anterior right lobe of the liver (series 20, image 16) and some are faintly hyperenhancing, for example in the peripheral inferior right lobe of the liver (series 20, image 36). Given number and confluent character these are difficult to distinctly measure however a relatively solitary index lesion in the posterior right lobe of the liver, hepatic segment VI, measures 3.2 x 2.8 cm (series 3, image 24). No gallstones, gallbladder wall thickening, or biliary dilatation. Pancreas: Unremarkable. No pancreatic ductal dilatation or surrounding inflammatory changes. Spleen: Normal in size without significant abnormality. Adrenals/Urinary Tract: Adrenal glands are unremarkable. Simple, benign bilateral renal cortical cysts, for which no further follow-up or characterization is required. Kidneys are otherwise normal, without obvious renal calculi, solid lesion, or hydronephrosis. Stomach/Bowel: Stomach is within normal limits. No evidence of bowel wall thickening, distention, or inflammatory changes. Vascular/Lymphatic: No significant vascular findings are present. No enlarged abdominal lymph nodes. Other: No abdominal wall hernia or abnormality. No ascites. Probable partially imaged left breast mass (series 4, image 1) Musculoskeletal: No acute osseous findings. Diffuse, heterogeneous marrow enhancement (series 19, image 53). IMPRESSION: 1. Hepatomegaly with very extensive, nearly confluent masslike and nodular signal abnormality throughout the liver, most consistent with extensive hepatic metastatic disease. 2. Diffuse, heterogeneous marrow enhancement, highly suspicious for diffuse osseous metastatic disease. 3. Probable partially imaged left breast mass. Constellation of findings is highly suggestive of diffusely metastatic breast malignancy. Consider additional imaging of the chest and correlation with mammography. Electronically Signed   By: Jearld Lesch M.D.   On: 12/18/2022 05:26   ECHOCARDIOGRAM  COMPLETE  Result Date: 12/17/2022    ECHOCARDIOGRAM REPORT   Patient Name:   Stephanie [name] Date of Exam: 12/17/2022 Medical Rec #:  [id number]       Height:       72.0 in Accession #:    [id number]      Weight:       163.1 lb Date of Birth:  12/23/1955       BSA:          1.954 m Patient Age:    67 years        BP:           120/64 mmHg Patient Gender: F  HR:           106 bpm. Exam Location:  [facility] Procedure: 2D Echo, Cardiac Doppler and Color Doppler Indications:     Dyspnea R06.00  History:         Patient has prior history of Echocardiogram examinations, most                  recent 05/19/2020. Stroke; Risk Factors:Hypertension and                  Diabetes.  Sonographer:     [name] Referring Phys:  [id number] Marcelino Duster Diagnosing Phys: Debbe Odea MD  Sonographer Comments: Suboptimal apical window. IMPRESSIONS  1. Left ventricular ejection fraction, by estimation, is 70 to 75%. The left ventricle has hyperdynamic function. The left ventricle has no regional wall motion abnormalities. There is mild left ventricular hypertrophy. Left ventricular diastolic parameters are consistent with Grade I diastolic dysfunction (impaired relaxation).  2. Right ventricular systolic function is normal. The right ventricular size is normal.  3. The mitral valve is normal in structure. No evidence of mitral valve regurgitation.  4. The aortic valve is grossly normal. Aortic valve regurgitation is not visualized.  5. The inferior vena cava is normal in size with greater than 50% respiratory variability, suggesting right atrial pressure of 3 mmHg. FINDINGS  Left Ventricle: Left ventricular ejection fraction, by estimation, is 70 to 75%. The left ventricle has hyperdynamic function. The left ventricle has no regional wall motion abnormalities. The left ventricular internal cavity size was normal in size. There is mild left ventricular hypertrophy. Left ventricular diastolic parameters are consistent with  Grade I diastolic dysfunction (impaired relaxation). Right Ventricle: The right ventricular size is normal. No increase in right ventricular wall thickness. Right ventricular systolic function is normal. Left Atrium: Left atrial size was normal in size. Right Atrium: Right atrial size was normal in size. Pericardium: There is no evidence of pericardial effusion. Mitral Valve: The mitral valve is normal in structure. No evidence of mitral valve regurgitation. Tricuspid Valve: The tricuspid valve is normal in structure. Tricuspid valve regurgitation is not demonstrated. Aortic Valve: The aortic valve is grossly normal. Aortic valve regurgitation is not visualized. Aortic valve mean gradient measures 9.0 mmHg. Aortic valve peak gradient measures 16.0 mmHg. Aortic valve area, by VTI measures 3.68 cm. Pulmonic Valve: The pulmonic valve was normal in structure. Pulmonic valve regurgitation is not visualized. Aorta: The aortic root is normal in size and structure. Venous: The inferior vena cava was not well visualized. The inferior vena cava is normal in size with greater than 50% respiratory variability, suggesting right atrial pressure of 3 mmHg. IAS/Shunts: No atrial level shunt detected by color flow Doppler.  LEFT VENTRICLE PLAX 2D LVIDd:         4.60 cm   Diastology LVIDs:         2.80 cm   LV e' medial:    9.57 cm/s LV PW:         1.10 cm   LV E/e' medial:  7.9 LV IVS:        1.20 cm   LV e' lateral:   5.98 cm/s LVOT diam:     2.10 cm   LV E/e' lateral: 12.7 LV SV:         93 LV SV Index:   48 LVOT Area:     3.46 cm  RIGHT VENTRICLE RV S prime:     22.70 cm/s TAPSE (M-mode): 2.6 cm  LEFT ATRIUM             Index        RIGHT ATRIUM           Index LA diam:        3.90 cm 2.00 cm/m   RA Area:     16.20 cm LA Vol (A2C):   29.6 ml 15.15 ml/m  RA Volume:   42.50 ml  21.75 ml/m LA Vol (A4C):   45.1 ml 23.08 ml/m LA Biplane Vol: 39.5 ml 20.22 ml/m  AORTIC VALVE AV Area (Vmax):    3.41 cm AV Area (Vmean):   3.07  cm AV Area (VTI):     3.68 cm AV Vmax:           200.00 cm/s AV Vmean:          133.000 cm/s AV VTI:            0.253 m AV Peak Grad:      16.0 mmHg AV Mean Grad:      9.0 mmHg LVOT Vmax:         197.00 cm/s LVOT Vmean:        118.000 cm/s LVOT VTI:          0.269 m LVOT/AV VTI ratio: 1.06  AORTA Ao Root diam: 2.80 cm MITRAL VALVE                TRICUSPID VALVE MV Area (PHT): 6.32 cm     TR Peak grad:   15.8 mmHg MV Decel Time: 120 msec     TR Vmax:        199.00 cm/s MV E velocity: 76.00 cm/s MV A velocity: 159.00 cm/s  SHUNTS MV E/A ratio:  0.48         Systemic VTI:  0.27 m                             Systemic Diam: 2.10 cm Debbe Odea MD Electronically signed by Debbe Odea MD Signature Date/Time: 12/17/2022/2:23:47 PM    Final    DG Chest 1 View  Result Date: 12/17/2022 CLINICAL DATA:  Shortness of breath. EXAM: CHEST  1 VIEW COMPARISON:  12/14/2022 FINDINGS: Single-view of the chest demonstrates slightly low lung volumes. No focal airspace disease or consolidation. No overt pulmonary edema. Heart and mediastinum are grossly stable. Patient appears to be mildly rotated on this examination. No acute bone abnormality. IMPRESSION: No active disease. Electronically Signed   By: Richarda Overlie M.D.   On: 12/17/2022 13:28   MR THORACIC SPINE W WO CONTRAST  Result Date: 12/17/2022 CLINICAL DATA:  Initial evaluation for right lower extremity weakness. EXAM: MRI THORACIC WITHOUT AND WITH CONTRAST TECHNIQUE: Multiplanar and multiecho pulse sequences of the thoracic spine were obtained without and with intravenous contrast. CONTRAST:  7mL GADAVIST GADOBUTROL 1 MMOL/ML IV SOLN COMPARISON:  None Available. FINDINGS: Alignment: Trace dextroscoliosis with mild straightening of the normal midthoracic kyphosis. No listhesis. Vertebrae: Vertebral body height maintained without acute or chronic fracture. Bone marrow signal intensity markedly heterogeneous. Few small STIR hyperintense lesions involving the teeth to  through T4 vertebral bodies noted, favored to reflect atypical hemangiomata. No other worrisome osseous lesions. No other abnormal marrow edema or enhancement. Cord:  Normal signal and morphology.  No abnormal enhancement. Paraspinal and other soft tissues: Paraspinous soft tissues are within normal limits. Trace layering bilateral pleural effusions noted. Innumerable cystic lesions noted within  the partially visualized liver, incompletely assessed on this exam. Disc levels: Ordinary for age multilevel disc desiccation seen throughout the thoracic spine. No significant disc bulge or focal disc herniation. Posterior element hypertrophy noted at T6-7. No significant spinal stenosis. Foramina remain patent. No neural impingement. IMPRESSION: 1. Normal MRI appearance of the thoracic spinal cord. No findings to explain patient's symptoms identified. 2. Minor thoracic spondylosis for age. No significant stenosis or neural impingement. 3. Innumerable cystic lesions within the partially visualized liver, incompletely assessed on this exam. Again, further evaluation with dedicated MRI of the abdomen, with and without contrast, recommended for further evaluation. Electronically Signed   By: Rise Mu M.D.   On: 12/17/2022 04:40   MR CERVICAL SPINE W WO CONTRAST  Result Date: 12/16/2022 CLINICAL DATA:  Initial evaluation for weakness, hyper reflexia. EXAM: MRI CERVICAL SPINE WITHOUT AND WITH CONTRAST TECHNIQUE: Multiplanar and multiecho pulse sequences of the cervical spine, to include the craniocervical junction and cervicothoracic junction, were obtained without and with intravenous contrast. CONTRAST:  7mL GADAVIST GADOBUTROL 1 MMOL/ML IV SOLN COMPARISON:  None Available. FINDINGS: Alignment: Examination mildly degraded by motion. Straightening of the normal cervical lordosis. No significant listhesis. Vertebrae: Vertebral body height maintained without acute or chronic fracture. Bone marrow signal intensity  diffusely heterogeneous. Subcentimeter stir hyperintense lesions involving the T2 and T3 vertebral bodies noted, favored to reflect small atypical hemangiomata noted. No worrisome osseous lesions. No other abnormal marrow edema or enhancement. Cord: Normal signal and morphology. No convincing cord signal changes seen on this motion degraded exam. No abnormal enhancement. Posterior Fossa, vertebral arteries, paraspinal tissues: Patient's known cavernous ICA aneurysm noted, partially visualized. Craniocervical junction within normal limits. Paraspinous soft tissues within normal limits. Normal flow voids seen within the vertebral arteries bilaterally. Disc levels: C2-C3: Mild disc bulge. No spinal stenosis. Foramina remain patent. C3-C4: Mild disc bulge with uncovertebral spurring. Flattening and partial effacement of the ventral thecal sac with resultant mild spinal stenosis. Mild left C4 foraminal narrowing. Right neural foramen remains patent. C4-C5: Mild diffuse disc bulge with uncovertebral spurring. Flattening and partial effacement of the ventral thecal sac. Mild cord flattening without cord signal changes. Mild spinal stenosis. Foramina remain patent. C5-C6: Mild disc bulge with uncovertebral spurring. Flattening and partial effacement of the ventral thecal sac with resultant mild spinal stenosis. Foramina remain patent. C6-C7: Degenerative intervertebral disc space narrowing with diffuse disc osteophyte complex. Posterior component flattens and partially effaces the ventral thecal sac. No more than mild spinal stenosis. Mild left C7 foraminal narrowing. Right neural foramen remains patent. C7-T1: Mild degenerative disc space narrowing with diffuse disc bulge and uncovertebral spurring. Mild bilateral facet hypertrophy. Resultant mild spinal stenosis. Mild left C8 foraminal narrowing. Right neural foramen remains patent. IMPRESSION: 1. Normal MRI appearance of the cervical spinal cord. No cord signal changes  to suggest myelopathy or other abnormality. 2. Multilevel cervical spondylosis with resultant mild diffuse spinal stenosis at C3-4 through C7-T1. 3. Mild left C4, C7, and C8 foraminal stenosis related to disc bulge and uncovertebral disease. 4. 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 Electronically Signed   By: Rise Mu M.D.   On: 12/16/2022 06:21   MR LUMBAR SPINE WO CONTRAST  Result Date: 12/14/2022 CLINICAL DATA:  Initial evaluation for acute myelopathy. EXAM: MRI LUMBAR SPINE WITHOUT CONTRAST TECHNIQUE: Multiplanar, multisequence MR imaging of the lumbar spine was performed. No intravenous contrast was administered. COMPARISON:  None Available. FINDINGS: Segmentation:  Standard. Alignment:  Physiologic.  Vertebrae: Vertebral body height maintained without acute or chronic fracture. Bone marrow signal intensity diffusely heterogeneous. No worrisome osseous lesions or abnormal marrow edema. Conus medullaris and cauda equina: Conus extends to the T12 level. Conus and cauda equina appear normal. Paraspinal and other soft tissues: Paraspinous soft tissues within normal limits. Few scattered T2 hyperintense cyst noted about the kidneys, largest of which measures 2 cm on the left. These are benign in appearance, with no follow-up imaging recommended. Innumerable cystic lesions noted within the partially visualized liver. Disc levels: L1-2: Disc desiccation with minimal annular bulge. Mild facet hypertrophy. No stenosis. L2-3: Degenerative intervertebral disc space narrowing with circumferential disc bulge and disc desiccation. Mild reactive endplate spurring. Mild bilateral facet hypertrophy. No significant spinal stenosis. Foramina remain patent. L3-4: Disc desiccation with mild diffuse disc bulge. Mild reactive endplate spurring. Mild facet and ligament flavum hypertrophy. Resultant mild-to-moderate spinal stenosis. Mild bilateral L3 foraminal narrowing.  L4-5: Disc desiccation with diffuse disc bulge. Superimposed small right subarticular disc protrusion with slight inferior migration and annular fissure. Protruding disc contacts the descending right L5 nerve root in the right lateral recess (series 12, image 31). Moderate bilateral facet arthrosis. Resultant moderate canal with bilateral subarticular stenosis. Mild bilateral L4 foraminal narrowing. L5-S1:  Negative interspace.  Mild facet hypertrophy.  No stenosis. IMPRESSION: 1. Disc bulge with small right subarticular disc protrusion at L4-5, potentially affecting the descending right L5 nerve root. Moderate canal with mild bilateral L4 foraminal stenosis at this level. 2. Disc bulge with facet hypertrophy at L3-4 with resultant mild to moderate spinal stenosis, with mild bilateral L3 foraminal narrowing. 3. Innumerable cystic lesions within the visualized liver. While these could reflect benign cysts, these are incompletely assessed on this exam. Correlation with dedicated abdominal MRI recommended for further evaluation. Electronically Signed   By: Rise Mu M.D.   On: 12/14/2022 22:30   US ABDOMEN LIMITED RUQ (LIVER/GB)  Result Date: 12/14/2022 CLINICAL DATA:  Right upper quadrant pain and elevated liver function test. EXAM: ULTRASOUND ABDOMEN LIMITED RIGHT UPPER QUADRANT COMPARISON:  None Available. FINDINGS: Gallbladder: Gallbladder is partially contracted. No gallstones or wall thickening visualized (3.3 mm). No sonographic Murphy sign noted by sonographer. Common bile duct: Diameter: 1.6 mm Liver: A 1.2 cm x 1.0 cm x 1.2 cm hypoechoic area is seen within the right lobe of the liver. The hepatic parenchyma is nodular in contour and coarse in echogenicity. Portal vein is patent on color Doppler imaging with normal direction of blood flow towards the liver. Other: None. IMPRESSION: 1. Hepatic cirrhosis. 2. 1.2 cm x 1.0 cm x 1.2 cm hypoechoic liver lesion, as described above. MRI correlation is  recommended to further exclude the presence of an underlying neoplastic process. Electronically Signed   By: Aram Candela M.D.   On: 12/14/2022 18:10   MR BRAIN WO CONTRAST  Result Date: 12/14/2022 CLINICAL DATA:  Neuro deficit, acute, stroke suspected. Weakness over the last 3 weeks EXAM: MRI HEAD WITHOUT CONTRAST TECHNIQUE: Multiplanar, multiecho pulse sequences of the brain and surrounding structures were obtained without intravenous contrast. COMPARISON:  Head CT same day.  MRI 05/18/2020. FINDINGS: Brain: Diffusion imaging does not show any acute or subacute infarction. Mild chronic small-vessel ischemic change affects pons. 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 Cerebral hemispheres show moderate to marked chronic small-vessel ischemic changes of the white matter, similar to the prior exam. No large vessel territory stroke. No mass lesion, hemorrhage, hydrocephalus or extra-axial collection. 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 Skull and upper cervical spine: Negative Sinuses/Orbits: Clear/normal Other: None IMPRESSION: 1. No acute finding. Moderate to marked chronic small-vessel ischemic changes of the pons and cerebral hemispheric white matter, similar to the study of 05/18/2020. 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 2. 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 Electronically Signed   By: Paulina Fusi M.D.   On: 12/14/2022 13:53   CT Head Wo Contrast  Result Date: 12/14/2022 CLINICAL DATA:  Neuro deficit, acute, stroke suspected. Weakness with inability to raise the right leg. Recent falls. EXAM: CT HEAD WITHOUT CONTRAST TECHNIQUE: Contiguous axial images were obtained from the base of the skull through the vertex without intravenous contrast. RADIATION DOSE REDUCTION: This exam was performed according to the departmental  dose-optimization program which includes automated exposure control, adjustment of the mA and/or kV according to patient size and/or use of iterative reconstruction technique. COMPARISON:  Head and neck CTA 05/19/2020 FINDINGS: Brain: There is no evidence of an acute infarct, intracranial hemorrhage, mass, midline shift, or extra-axial fluid collection. Mild cerebral atrophy is within normal limits for age. Patchy hypodensities in the cerebral white matter are similar to the prior study and are nonspecific but compatible with moderate chronic small vessel ischemic disease. Vascular: Calcified atherosclerosis at the skull base. Known 1.4 cm peripherally calcified right cavernous ICA aneurysm, similar in size to the prior CTA. Skull: No acute fracture or suspicious osseous lesion. Sinuses/Orbits: Visualized paranasal sinuses and mastoid air cells are clear. Unremarkable orbits. Other: None. IMPRESSION: 1. No evidence of acute intracranial abnormality. 2. Moderate chronic small vessel ischemic disease. 3. Known right cavernous ICA aneurysm. Electronically Signed   By: Sebastian Ache M.D.   On: 12/14/2022 11:51   DG Chest Port 1 View  Result Date: 12/14/2022 CLINICAL DATA:  Weakness. EXAM: PORTABLE CHEST 1 VIEW COMPARISON:  Chest radiograph 06/01/2020 and CT 06/09/2020 FINDINGS: The cardiomediastinal silhouette is unchanged with normal heart size. Minor bilateral lung scarring is noted. No acute airspace consolidation, edema, sizable pleural effusion, or pneumothorax is identified. No acute osseous abnormality is seen. IMPRESSION: No active disease. Electronically Signed   By: Sebastian Ache M.D.   On: 12/14/2022 11:46    PERFORMANCE STATUS (ECOG) : 3 - Symptomatic, >50% confined to bed  Review of Systems Unless otherwise noted, a complete review of systems is negative.  Physical Exam General: NAD Cardiovascular: regular rate and rhythm Pulmonary: clear ant fields Abdomen: soft, nontender, + bowel sounds GU:  no suprapubic tenderness Extremities: no edema, no joint deformities Skin: no rashes Neurological: Weakness but otherwise nonfocal  IMPRESSION: I met with patient and her cousin, Katrina.  Patient recognizes that workup is consistent with an advanced stage cancer.  Patient is status post liver biopsy with pathology pending.  Patient says that she is interested in pursuing cancer treatment if any options are available.  Prior to this hospitalization, she was living at home with her brother.  She was previously mostly independent with her own care but she has had progressively worsening performance status over the past several weeks.  Patient is planning to discharge to rehab [facility].  However, family are exploring possible options for long-term care placement or patient moving in with an uncle.  Symptomatically, she has had pain but she says that that is reasonably well-controlled at present.  Patient does not have any advance directives.  She is currently DNR/DNI.  She would want her cousin -Katrina to be her healthcare proxy if needed.  Patient intends to complete ACP documents.  PLAN: -Continue current scope of treatment -Dispo: Rehab with palliative care following -DNR/DNI -Will follow-up in clinic   Time Total: 30 minutes  Visit consisted of counseling and education dealing with the complex and emotionally intense issues of symptom management and palliative care in the setting of serious and potentially life-threatening illness.Greater than 50%  of this time was spent counseling and coordinating care related to the above assessment and plan.  Signed by: Laurette Schimke, PhD, NP-C

## 2022-12-20 NOTE — Progress Notes (Signed)
[facility] Progress Note  Patient: Stephanie [name]    ZOX:[id number]  DOA: 12/14/2022     Date of Service: the patient was seen and examined on 12/20/2022  Chief Complaint  Patient presents with   Weakness   Brief hospital course: Stephanie [name] is a 67 y.o. female with medical history significant of hypertension, tobacco use, hyperlipidemia, CVA, type 2 diabetes, who presents to the ED due to right leg weakness started approximately 3 weeks ago, she had rapid onset right lower extremity weakness.  She feels that her thigh and upper calf are more affected than her feet.  Her symptoms have gradually worsened and led to multiple falls at home.  She denies any loss of consciousness or injuries though.  She denies any focal weakness in any other limb.  She denies any dizziness, vision loss, headache, dysphagia, dysarthria, facial droop.  Patient is admitted to Hospitalist service for further management, imaging rule out stroke. Neurology, Neurosurgery teams consulted for abnormal MRI spine findings. MRI L spine findings does not correlate with her weakness. She does have generalized weakness. Today she did complain of shortness of breath, weakness, cough. Tachycardia, tachypnea noted. Chest Xray, Echo, EKG ordered. Duonebs, Lasix, Mucinex ordered.   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 Elevated LFTs and cystic liver lesion most likely due to malignancy Hepatitis panel negative. PT/INR wnl. GI consulted, advised MRI abdomen.  MRI abd: Extensive hepatic metastatic disease, marrow enhancement suspicious for osseous metastasis.  Left breast mass suspicious for metastatic breast malignancy. Hold home atorvastatin. F/u mammogram Follow oncologist, recommended biopsy and follow-up as an  outpatient 8/5 s/p liver biopsy done by IR, follow with oncologist for biopsy report and further management plan as an outpatient   # Right leg weakness Right lower extremity weakness that began 3 weeks ago.  CT of the head and MRI brain negative for any acute infarct, however moderate to severe chronic small vessel disease.   MRI L spine reviewed with neurosurgery - not significant to cause symptoms. Neurology evaluation appreciated. Advised MRI C spine given all 4 limbs weak with brisk reflexes. MRI C spine reviewed by neurosurgery advised to get MRI thoracic spine which did not reveal any acute abnormalities. Discussed with neurology team - diffuse weakness likely due to deconditioning. PT advised rehab placement.      COPD with acute exacerbation  She does have SOB, cough. Chest xray shows no airspace disease. Echo reviewed is unremarkable. Trial of Lasix given. Continuous pulse oximetry. Supplemental oxygen to maintain oxygen saturation above 88%. S/p prednisone 40 mg pod x 5-days, completed course DuoNebs every 6 hours. Mucinex Q12 prn   Acute kidney injury Kidney function improved with IV fluids. Avoid nephrotoxic agents.  Encourage oral diet.   Prediabetes A1c ordered to prior labs. Diet, exercise and weight reduction advised.   Primary hypertension Continue Amlodipine 10 mg p.o. daily and hydralazine 50 p.o. every 8 hourly Monitor BP and titrate medications accordingly    CVA (cerebral vascular accident) (HCC) Continue Aspirin 81 mg daily. Statin held due to abnormal LFT.   Body mass index is 22.13 kg/m.  Interventions:  Diet: Carb modified diet DVT Prophylaxis: Subcutaneous Lovenox   Advance goals of care discussion: DNR  Family Communication: family was not present at bedside, at the time of interview.  The pt provided permission to discuss medical plan with the family. Opportunity was given to ask question and all questions were answered satisfactorily.    Disposition:  Pt is from home, admitted with metastatic malignancy most likely left breast cancer, s/p liver biopsy done on 8/5, tolerated well, no complications.  Patient is stable to discharge tomorrow a.m.  Discharge to SNF, tomorrow a.m.  TOC following for disposition plan  Subjective: No significant overnight events, resting comfortably, denies any active issues.  Patient agreed for biopsy. I saw patient again after biopsy, tolerated procedure well.  Denies any complaints.  Patient wanted to stay another day and go to rehab tomorrow a.m.  Physical Exam: General: NAD, lying comfortably Appear in no distress, affect appropriate Eyes: PERRLA ENT: Oral Mucosa Clear, moist  Neck: no JVD,  Cardiovascular: S1 and S2 Present, no Murmur,  Respiratory: good respiratory effort, Bilateral Air entry equal and Decreased, no Crackles, no wheezes Abdomen: Bowel Sound present, Soft and no tenderness,  Skin: no rashes Extremities: no Pedal edema, no calf tenderness Neurologic: without any new focal findings, RLE power 4/5 Gait not checked due to patient safety concerns  Vitals:   12/20/22 1258 12/20/22 1300 12/20/22 1315 12/20/22 1432  BP: 121/78 123/76 112/74   Pulse: (!) 0 92 84 (!) 104  Resp: (!) 0 15 14 16   Temp:    98 F (36.7 C)  TempSrc:    Oral  SpO2: 96% 93% 91% 94%  Weight:      Height:       No intake or output data in the 24 hours ending 12/20/22 1535  Filed Weights   12/14/22 1037  Weight: 74 kg    Data Reviewed: I have personally reviewed and interpreted daily labs, tele strips, imagings as discussed above. I reviewed all nursing notes, pharmacy notes, vitals, pertinent old records I have discussed plan of care as described above with RN and patient/family.  CBC: Recent Labs  Lab 12/14/22 1049 12/15/22 0222 12/18/22 0456 12/19/22 0427 12/20/22 0421  WBC 9.1 7.2 13.0* 12.1* 11.8*  HGB 11.7* 11.3* 10.8* 10.1* 10.2*  HCT 35.7* 34.5* 32.5* 28.6* 29.9*  MCV  83.2 82.7 80.6 78.1* 78.7*  PLT 319 310 369 351 392   Basic Metabolic Panel: Recent Labs  Lab 12/15/22 0222 12/17/22 0325 12/18/22 0456 12/19/22 0427 12/20/22 0421  NA 135 134* 136 135 137  K 3.7 3.8 3.9 4.1 4.2  CL 105 103 102 105 104  CO2 20* 24 23 26 23   GLUCOSE 125* 123* 114* 107* 99  BUN 26* 28* 38* 39* 37*  CREATININE 1.16* 1.29* 1.53* 1.58* 1.51*  CALCIUM 10.2 10.1 10.4* 9.8 10.3  MG  --   --   --  2.2 2.3  PHOS  --   --   --  2.7 2.7    Studies: IR US LIVER BIOPSY  Result Date: 12/20/2022 INDICATION: Liver masses.  No diagnosis. EXAM: ULTRASOUND GUIDED LIVER MASS BIOPSY COMPARISON:  MRI abdomen 12/18/2022 MEDICATIONS: None ANESTHESIA/SEDATION: Moderate (conscious) sedation was employed during this procedure. A total of Versed 1 mg and Fentanyl 50 mcg was administered intravenously. Moderate Sedation Time: 10 minutes. The patient's level of consciousness and vital signs were monitored continuously by radiology nursing throughout the procedure under my direct supervision. COMPLICATIONS: None immediate. PROCEDURE: Informed written consent was obtained from insufficient wrap after a discussion of the risks, benefits and alternatives to treatment. The patient understands and consents the procedure. A timeout was performed prior to the  initiation of the procedure. Ultrasound scanning was performed of the upper abdominal quadrant demonstrates tumor replacement of the LEFT hepatic lobe The LEFT hepatic lobe was selected for biopsy and the procedure was planned. The upper abdominal quadrant was prepped and draped in the usual sterile fashion. The overlying soft tissues were anesthetized with 1% lidocaine. A 17 gauge, 6.8 cm co-axial needle was advanced into a peripheral aspect of the lesion. This was followed by 4 core biopsies with an 18 gauge core device under direct ultrasound guidance. The coaxial needle tract was embolized with a small amount of Gel-Foam slurry and superficial hemostasis  was obtained with manual compression. Post procedural scanning was negative for definitive area of hemorrhage or additional complication. A dressing was placed. The patient tolerated the procedure well without immediate post procedural complication. IMPRESSION: Successful ultrasound guided core needle biopsy of liver mass. [name], MD [facility] Electronically Signed   By: [name] M.D.   On: 12/20/2022 13:26   US Abdomen Limited RUQ (LIVER/GB)  Result Date: 12/20/2022 CLINICAL DATA:  [id number] Liver mass [id number] EXAM: ULTRASOUND ABDOMEN LIMITED RIGHT UPPER QUADRANT COMPARISON:  CT chest, 12/18/2022. MR abdomen, 12/18/2022. US Abdomen, 12/14/2022. FINDINGS: Gallbladder: No gallstones or wall thickening visualized. No sonographic Murphy sign noted by sonographer. Common bile duct: Diameter: 0.5 cm Liver: Nodular contour liver. Heterogeneous hepatic parenchymal echogenicity, with innumerable liver lesions scattered within the RIGHT and LEFT lobes of the liver. Single largest lesion identified within the RIGHT lobe measures up to 2.7 cm. Portal vein is patent on color Doppler imaging with normal direction of blood flow towards the liver. Other: No perihepatic ascites. IMPRESSION: 1. Innumerable lesions, scattered within the liver. 2. No biliary ductal dilatation. Electronically Signed   By: [name] M.D.   On: 12/20/2022 12:10    Scheduled Meds:  amLODipine  10 mg Oral Daily   [START ON 12/21/2022] enoxaparin (LOVENOX) injection  40 mg Subcutaneous Q24H   hydrALAZINE  50 mg Oral Q8H   multivitamin with minerals  1 tablet Oral Daily   pantoprazole  20 mg Oral BID   polyethylene glycol  17 g Oral BID   sodium chloride flush  3 mL Intravenous Q12H   Continuous Infusions:   PRN Meds: acetaminophen **OR** acetaminophen, ALPRAZolam, bisacodyl, bisacodyl, dextromethorphan-guaiFENesin, ipratropium-albuterol, LORazepam, ondansetron **OR**  ondansetron (ZOFRAN) IV  Time spent: 35 minutes  Author: Gillis Santa. MD [facility] 12/20/2022 3:35 PM  To reach On-call, see care teams to locate the attending and reach out to them via [email or web address]. If 7PM-7AM, please contact night-coverage If you still have difficulty reaching the attending provider, please page the [facility] (Director on Call) for [facility] on amion for assistance.

## 2022-12-20 NOTE — Progress Notes (Signed)
PT Cancellation Note  Patient Details Name: SARANN MONTEALEGRE MRN: [id number] DOB: 07/12/1955   Cancelled Treatment:    Reason Eval/Treat Not Completed: Active bedrest order (Patient on bed rest following procedure. PT to follow up as appropriate)  Donna Bernard, PT, MPT  Ina Homes 12/20/2022, 1:28 PM

## 2022-12-21 ENCOUNTER — Telehealth: Payer: Self-pay | Admitting: Internal Medicine

## 2022-12-21 ENCOUNTER — Other Ambulatory Visit: Payer: Self-pay | Admitting: *Deleted

## 2022-12-21 DIAGNOSIS — R29898 Other symptoms and signs involving the musculoskeletal system: Secondary | ICD-10-CM | POA: Diagnosis not present

## 2022-12-21 DIAGNOSIS — R160 Hepatomegaly, not elsewhere classified: Secondary | ICD-10-CM

## 2022-12-21 LAB — CANCER ANTIGEN 27.29: CA 27.29: 443.3 U/mL — ABNORMAL HIGH (ref 0.0–38.6)

## 2022-12-21 MED ORDER — POLYETHYLENE GLYCOL 3350 17 G PO PACK: 17.0000 g | PACK | Freq: Two times a day (BID) | ORAL | Status: DC

## 2022-12-21 MED ORDER — BISACODYL 5 MG PO TBEC: 10.0000 mg | DELAYED_RELEASE_TABLET | Freq: Every day | ORAL | Status: DC | PRN

## 2022-12-21 NOTE — Telephone Encounter (Signed)
follow up In cancer center- in 1 week; MD-labs- cbc/cmp;Dr.B re: breast cancer.

## 2022-12-21 NOTE — TOC Progression Note (Signed)
Transition of Care [facility]) - Progression Note    Patient Details  Name: Stephanie [name] MRN: [id number] Date of Birth: 1955/07/14  [facility] CM/SW Contact  Margarito Liner, LCSW Phone Number: 12/21/2022, 8:05 AM  Clinical Narrative:   CSW called [employer] and confirmed patient's SNF insurance authorization has 24-hour grace period. She can admit to SNF by 11:59 pm tonight without having to get a new authorization. [facility] admissions coordinator is aware.  Expected Discharge Plan: Skilled Nursing Facility Barriers to Discharge: Continued Medical Work up  Expected Discharge Plan and Services     Post Acute Care Choice: Skilled Nursing Facility Living arrangements for the past 2 months: Single Family Home                                       Social Determinants of Health (SDOH) Interventions SDOH Screenings   Food Insecurity: No Food Insecurity (12/15/2022)  Housing: Low Risk  (12/15/2022)  Transportation Needs: No Transportation Needs (12/15/2022)  Utilities: Not At Risk (12/15/2022)  Tobacco Use: Medium Risk (12/15/2022)    Readmission Risk Interventions     No data to display

## 2022-12-21 NOTE — TOC Transition Note (Signed)
Transition of Care [facility]) - CM/SW Discharge Note   Patient Details  Name: Stephanie [name] MRN: [id number] Date of Birth: 1955/10/23  Transition of Care [facility]) CM/SW Contact:  Margarito Liner, LCSW Phone Number: 12/21/2022, 1:43 PM   Clinical Narrative:  Patient has orders to discharge to [facility] SNF today. RN has already called report. EMS transport has been arranged and she is first on the list. No further concerns. CSW signing off.  Final next level of care: Skilled Nursing Facility Barriers to Discharge: Barriers Resolved   Patient Goals and CMS Choice CMS Medicare.gov Compare Post Acute Care list provided to:: Patient Choice offered to / list presented to : Patient  Discharge Placement     Existing PASRR number confirmed : 12/15/22          Patient [facility] bed at: [facility] Patient to be transferred to facility by: EMS Name of family member notified: [name] Patient and family notified of of transfer: 12/21/22  Discharge Plan and Services Additional resources added to the After Visit Summary for       Post Acute Care Choice: Skilled Nursing Facility                               Social Determinants of Health (SDOH) Interventions SDOH Screenings   Food Insecurity: No Food Insecurity (12/15/2022)  Housing: Low Risk  (12/15/2022)  Transportation Needs: No Transportation Needs (12/15/2022)  Utilities: Not At Risk (12/15/2022)  Tobacco Use: Medium Risk (12/15/2022)     Readmission Risk Interventions     No data to display

## 2022-12-21 NOTE — Discharge Summary (Signed)
[facility] Discharge Summary   Patient: Stephanie [name] UJW:[id number]  PCP: [facility]  Date of admission: 12/14/2022   Date of discharge:  12/21/2022     Discharge Diagnoses:  Principal Problem:   Right leg weakness Active Problems:   Elevated LFTs   COPD with acute exacerbation (HCC)   Acute kidney injury (HCC)   CVA (cerebral vascular accident) (HCC)   Primary hypertension   Prediabetes   Palliative care encounter   Liver mass   Admitted From: Home Disposition:  Home   Recommendations for Outpatient Follow-up:  F/u PCP, need to be seen by an MD in 1-2 days F/u Oncologist in 1 wk for cancer treatment Follow up LABS/TEST:  may need PET scan, f/u     Contact information for after-discharge care     Destination     [facility] .   Service: Skilled Nursing Contact information: [address] [place] [address] [phone]                    Diet recommendation: Regular diet  Activity: The patient is advised to gradually reintroduce usual activities, as tolerated  Discharge Condition: stable  Code Status: DNR   History of present illness: As per the H and P dictated on admission Hospital Course:  Stephanie [name] is a 67 y.o. female with medical history significant of hypertension, tobacco use, hyperlipidemia, CVA, type 2 diabetes, who presents to the ED due to right leg weakness started approximately 3 weeks ago, she had rapid onset right lower extremity weakness.  She feels that her thigh and upper calf are more affected than her feet.  Her symptoms have gradually worsened and led to multiple falls at home.  She denies any loss of consciousness or injuries though.  She denies any focal weakness in any other limb.  She denies any dizziness, vision loss, headache, dysphagia, dysarthria, facial droop.  Patient is admitted to Hospitalist service for further management, imaging rule out stroke. Neurology,  Neurosurgery teams consulted for abnormal MRI spine findings. MRI L spine findings does not correlate with her weakness. She does have generalized weakness. Today she did complain of shortness of breath, weakness, cough. Tachycardia, tachypnea noted. Chest Xray, Echo, EKG ordered. Duonebs, Lasix, Mucinex ordered.   Assessment and Plan:   # Metastatic malignancy, most likely left breast cancer Patient was found to have hepatic and osseous metastasis on the CT abdomen pelvis CT chest: left breast mass, left axillary dependability, lung nodules and liver mets. Elevated LFTs and cystic liver lesion most likely due to malignancy. Hepatitis panel negative. PT/INR wnl. GI consulted, advised MRI abdomen.  MRI abd: Extensive hepatic metastatic disease, marrow enhancement suspicious for osseous metastasis.  Left breast mass suspicious for metastatic breast malignancy. Discontinued atorvastatin due to elevated LFTs. F/u mammogram. Follow oncologist, recommended biopsy and follow-up as an outpatient. On 8/5 s/p liver biopsy was done by IR, follow with oncologist for biopsy report and further management plan as an outpatient.  Tumor markers CA 15-3 level 323 elevated. CA27.29 level 443 elevated and CEA level 1467 elevated.   # Right leg weakness: Right lower extremity weakness that began 3 weeks ago.  CT of the head and MRI brain negative for any acute infarct, however moderate to severe chronic small vessel disease.   MRI L spine reviewed with neurosurgery - not significant to cause symptoms. Neurology evaluation appreciated. Advised MRI C spine given all 4 limbs weak with brisk reflexes. MRI  C spine reviewed by neurosurgery advised to get MRI thoracic spine which did not reveal any acute abnormalities. Discussed with neurology team - diffuse weakness likely due to deconditioning. PT advised rehab placement. # COPD with acute exacerbation: Shortness of breath and cough improved. Chest xray shows no airspace  disease. Echo reviewed is unremarkable. S/p Trial of Lasix given. S/p prednisone 40 mg pod x 5-days, completed course. S/p DuoNebs every 6 hours and Mucinex Q12 prn.  Continue DuoNeb as needed. # Acute kidney injury, Renal function improved with IV fluids. Avoid nephrotoxic agents.  Encourage oral diet. # Prediabetic, HbA1c 5.2, well-controlled.  Mild hyperglycemia could be due to steroid.  Blood glucose is well-controlled now. # Primary hypertension: Continue Amlodipine 10 mg p.o. daily and hydralazine 50 p.o. every 8 hourly. Monitor BP and titrate medications accordingly # H/o CVA (cerebral vascular accident) Continue Aspirin 81 mg daily.  Discontinued Lipitor secondary to elevated LFTs.  Repeat LFTs after 1 to 2 weeks and resume statin when able to.    Body mass index is 22.13 kg/m.  Nutrition Interventions:  - Patient was instructed, not to drive, operate heavy machinery, perform activities at heights, swimming or participation in water activities or provide baby sitting services while on Pain, Sleep and Anxiety Medications; until her outpatient Physician has advised to do so again.  - Also recommended to not to take more than prescribed Pain, Sleep and Anxiety Medications.  Patient was seen by physical therapy, who recommended Therapy, SNF placement, which was arranged. On the day of the discharge the patient's vitals were stable, and no other acute medical condition were reported by patient. the patient was felt safe to be discharge at [facility].  Consultants: Neurologist, oncologist, palliative care, GI and IR Procedures: Liver biopsy  Discharge Exam: General: Appear in no distress, no Rash; Oral Mucosa Clear, moist. Cardiovascular: S1 and S2 Present, no Murmur, Respiratory: normal respiratory effort, Bilateral Air entry present and no Crackles, no wheezes Abdomen: Bowel Sound present, Soft and no tenderness, no hernia Extremities: no Pedal edema, no calf tenderness Neurology: alert and  oriented to time, place, and person affect appropriate.  Filed Weights   12/14/22 1037  Weight: 74 kg   Vitals:   12/21/22 0600 12/21/22 0839  BP: 120/60 136/85  Pulse:  (!) 107  Resp:  18  Temp:  98.1 F (36.7 C)  SpO2:  95%    DISCHARGE MEDICATION: Allergies as of 12/21/2022       Reactions   Codeine Other (See Comments)   hallucinations   Lisinopril Swelling        Medication List     STOP taking these medications    atorvastatin 40 MG tablet Commonly known as: LIPITOR   clopidogrel 75 MG tablet Commonly known as: PLAVIX       TAKE these medications    amLODipine 10 MG tablet Commonly known as: NORVASC TAKE ONE TABLET BY MOUTH ONCE EVERY DAY FOR HIGH BLOOD PRESSURE   bisacodyl 5 MG EC tablet Commonly known as: DULCOLAX Take 2 tablets (10 mg total) by mouth daily as needed for moderate constipation.   hydrALAZINE 50 MG tablet Commonly known as: APRESOLINE TAKE ONE TABLET BY MOUTH 3 TIMES A DAY What changed: Another medication with the same name was removed. Continue taking this medication, and follow the directions you see here.   Ipratropium-Albuterol 20-100 MCG/ACT Aers respimat Commonly known as: COMBIVENT Inhale 1 puff into the lungs every 6 (six) hours.   pantoprazole 40 MG tablet  Commonly known as: PROTONIX TAKE ONE TABLET BY MOUTH 2 TIMES A DAY What changed: Another medication with the same name was removed. Continue taking this medication, and follow the directions you see here.   polyethylene glycol 17 g packet Commonly known as: MIRALAX / GLYCOLAX Take 17 g by mouth 2 (two) times daily.       Allergies  Allergen Reactions   Codeine Other (See Comments)    hallucinations   Lisinopril Swelling   Discharge Instructions     Ambulatory Referral to Palliative Care   Complete by: As directed    Call MD for:  difficulty breathing, headache or visual disturbances   Complete by: As directed    Call MD for:  extreme fatigue    Complete by: As directed    Call MD for:  persistant dizziness or light-headedness   Complete by: As directed    Call MD for:  persistant nausea and vomiting   Complete by: As directed    Call MD for:  severe uncontrolled pain   Complete by: As directed    Call MD for:  temperature >100.4   Complete by: As directed    Diet - low sodium heart healthy   Complete by: As directed    Discharge instructions   Complete by: As directed    F/u PCP, need to be seen by an MD in 1-2 days F/u Oncologist in 1 wk for cancer treatment   Increase activity slowly   Complete by: As directed    No wound care   Complete by: As directed        The results of significant diagnostics from this hospitalization (including imaging, microbiology, ancillary and laboratory) are listed below for reference.    Significant Diagnostic Studies: IR US LIVER BIOPSY  Result Date: 12/20/2022 INDICATION: Liver masses.  No diagnosis. EXAM: ULTRASOUND GUIDED LIVER MASS BIOPSY COMPARISON:  MRI abdomen 12/18/2022 MEDICATIONS: None ANESTHESIA/SEDATION: Moderate (conscious) sedation was employed during this procedure. A total of Versed 1 mg and Fentanyl 50 mcg was administered intravenously. Moderate Sedation Time: 10 minutes. The patient's level of consciousness and vital signs were monitored continuously by radiology nursing throughout the procedure under my direct supervision. COMPLICATIONS: None immediate. PROCEDURE: Informed written consent was obtained from insufficient wrap after a discussion of the risks, benefits and alternatives to treatment. The patient understands and consents the procedure. A timeout was performed prior to the initiation of the procedure. Ultrasound scanning was performed of the upper abdominal quadrant demonstrates tumor replacement of the LEFT hepatic lobe The LEFT hepatic lobe was selected for biopsy and the procedure was planned. The upper abdominal quadrant was prepped and draped in the usual  sterile fashion. The overlying soft tissues were anesthetized with 1% lidocaine. A 17 gauge, 6.8 cm co-axial needle was advanced into a peripheral aspect of the lesion. This was followed by 4 core biopsies with an 18 gauge core device under direct ultrasound guidance. The coaxial needle tract was embolized with a small amount of Gel-Foam slurry and superficial hemostasis was obtained with manual compression. Post procedural scanning was negative for definitive area of hemorrhage or additional complication. A dressing was placed. The patient tolerated the procedure well without immediate post procedural complication. IMPRESSION: Successful ultrasound guided core needle biopsy of liver mass. [name], MD [facility] Radiology Electronically Signed   By: [name] M.D.   On: 12/20/2022 13:26   US Abdomen Limited RUQ (LIVER/GB)  Result Date: 12/20/2022 CLINICAL  DATA:  [id number] Liver mass [id number] EXAM: ULTRASOUND ABDOMEN LIMITED RIGHT UPPER QUADRANT COMPARISON:  CT chest, 12/18/2022. MR abdomen, 12/18/2022. US Abdomen, 12/14/2022. FINDINGS: Gallbladder: No gallstones or wall thickening visualized. No sonographic Murphy sign noted by sonographer. Common bile duct: Diameter: 0.5 cm Liver: Nodular contour liver. Heterogeneous hepatic parenchymal echogenicity, with innumerable liver lesions scattered within the RIGHT and LEFT lobes of the liver. Single largest lesion identified within the RIGHT lobe measures up to 2.7 cm. Portal vein is patent on color Doppler imaging with normal direction of blood flow towards the liver. Other: No perihepatic ascites. IMPRESSION: 1. Innumerable lesions, scattered within the liver. 2. No biliary ductal dilatation. Electronically Signed   By: [name] M.D.   On: 12/20/2022 12:10   CT CHEST WO CONTRAST  Result Date: 12/18/2022 CLINICAL DATA:  Evaluate for occult malignancy EXAM: CT CHEST WITHOUT CONTRAST TECHNIQUE: Multidetector CT  imaging of the chest was performed following the standard protocol without IV contrast. RADIATION DOSE REDUCTION: This exam was performed according to the departmental dose-optimization program which includes automated exposure control, adjustment of the mA and/or kV according to patient size and/or use of iterative reconstruction technique. COMPARISON:  06/09/2020 FINDINGS: Cardiovascular: Scattered calcifications are seen in thoracic aorta. There is ectasia of right main pulmonary artery measuring 3.4 cm. Mediastinum/Nodes: There is 1.8 x 1.2 cm lymph node at the right cardiophrenic angle. There are enlarged lymph nodes in left axilla measuring up to 1.4 cm in short axis. There is 4.5 x 3.4 cm soft tissue mass in the upper aspect of left breast. There is skin thickening adjacent to this mass. Lungs/Pleura: Centrilobular emphysema is seen. Small linear patchy infiltrate is seen in right lower lobe. Increased markings are seen in the posterior segment of right upper lobe. In image 59 of series 4, there is 3 mm pleural-based nodule in left upper lobe. In image 50, there is a 2 mm nodule in the lateral aspect of right upper lobe. In image 68, there is a 3 mm nodule inseparable from the posterior aspect of minor fissure. There is no pleural effusion or pneumothorax. Upper Abdomen: Lobulations are seen in the margin of the liver. There are multiple low-density space-occupying lesions suggesting hepatic metastatic disease. There is 2 mm calcific density in the upper pole of left kidney, possibly left renal stone. Musculoskeletal: No focal lytic or sclerotic lesions are seen. IMPRESSION: There is 4.5 x 3.4 cm new soft tissue mass in the upper portion of left breast. This may suggest primary or metastatic malignant neoplasm. There are abnormally enlarged lymph nodes in left axilla suggesting metastatic lymphadenopathy. There is 1.8 x 1.2 cm lymph node at the right cardiophrenic angle suggesting possible metastatic  lymphadenopathy. PET-CT and tissue sampling may be considered. There are a few scattered tiny nodular densities in both lungs measuring up to 3 mm. This finding may suggest incidental granulomas or early pulmonary metastatic disease. There are space-occupying lesions in liver suggesting hepatic metastatic disease. Liver margin is lobulated suggesting possible cirrhosis. There is 2 mm left renal calculus. Electronically Signed   By: Ernie Avena M.D.   On: 12/18/2022 11:29   MR ABDOMEN W WO CONTRAST  Result Date: 12/18/2022 CLINICAL DATA:  Liver lesions, cirrhosis EXAM: MRI ABDOMEN WITHOUT AND WITH CONTRAST TECHNIQUE: Multiplanar multisequence MR imaging of the abdomen was performed both before and after the administration of intravenous contrast. CONTRAST:  7mL GADAVIST GADOBUTROL 1 MMOL/ML IV SOLN COMPARISON:  MR thoracic spine, 12/17/2022, CT chest abdomen pelvis, 06/09/2020  FINDINGS: Lower chest: No acute abnormality. Hepatobiliary: Hepatomegaly, maximum coronal span 23.0 cm. Coarse, nodular contour of the liver. Very extensive, nearly confluent masslike and nodular signal abnormality throughout the liver (series 4, image 17). Although multiphasic contrast enhanced sequences are significantly limited by breath motion artifact, these lesions are mostly isoenhancing to liver parenchyma, however some are hypoenhancing, for example in the anterior right lobe of the liver (series 20, image 16) and some are faintly hyperenhancing, for example in the peripheral inferior right lobe of the liver (series 20, image 36). Given number and confluent character these are difficult to distinctly measure however a relatively solitary index lesion in the posterior right lobe of the liver, hepatic segment VI, measures 3.2 x 2.8 cm (series 3, image 24). No gallstones, gallbladder wall thickening, or biliary dilatation. Pancreas: Unremarkable. No pancreatic ductal dilatation or surrounding inflammatory changes. Spleen:  Normal in size without significant abnormality. Adrenals/Urinary Tract: Adrenal glands are unremarkable. Simple, benign bilateral renal cortical cysts, for which no further follow-up or characterization is required. Kidneys are otherwise normal, without obvious renal calculi, solid lesion, or hydronephrosis. Stomach/Bowel: Stomach is within normal limits. No evidence of bowel wall thickening, distention, or inflammatory changes. Vascular/Lymphatic: No significant vascular findings are present. No enlarged abdominal lymph nodes. Other: No abdominal wall hernia or abnormality. No ascites. Probable partially imaged left breast mass (series 4, image 1) Musculoskeletal: No acute osseous findings. Diffuse, heterogeneous marrow enhancement (series 19, image 53). IMPRESSION: 1. Hepatomegaly with very extensive, nearly confluent masslike and nodular signal abnormality throughout the liver, most consistent with extensive hepatic metastatic disease. 2. Diffuse, heterogeneous marrow enhancement, highly suspicious for diffuse osseous metastatic disease. 3. Probable partially imaged left breast mass. Constellation of findings is highly suggestive of diffusely metastatic breast malignancy. Consider additional imaging of the chest and correlation with mammography. Electronically Signed   By: Jearld Lesch M.D.   On: 12/18/2022 05:26   ECHOCARDIOGRAM COMPLETE  Result Date: 12/17/2022    ECHOCARDIOGRAM REPORT   Patient Name:   Stephanie [name] Date of Exam: 12/17/2022 Medical Rec #:  [id number]       Height:       72.0 in Accession #:    [id number]      Weight:       163.1 lb Date of Birth:  13-Mar-1956       BSA:          1.954 m Patient Age:    67 years        BP:           120/64 mmHg Patient Gender: F               HR:           106 bpm. Exam Location:  [facility] Procedure: 2D Echo, Cardiac Doppler and Color Doppler Indications:     Dyspnea R06.00  History:         Patient has prior history of Echocardiogram examinations, most                   recent 05/19/2020. Stroke; Risk Factors:Hypertension and                  Diabetes.  Sonographer:     [name] Referring Phys:  [id number] Marcelino Duster Diagnosing Phys: Debbe Odea MD  Sonographer Comments: Suboptimal apical window. IMPRESSIONS  1. Left ventricular ejection fraction, by estimation, is 70 to 75%. The left ventricle has hyperdynamic function. The left ventricle has no  regional wall motion abnormalities. There is mild left ventricular hypertrophy. Left ventricular diastolic parameters are consistent with Grade I diastolic dysfunction (impaired relaxation).  2. Right ventricular systolic function is normal. The right ventricular size is normal.  3. The mitral valve is normal in structure. No evidence of mitral valve regurgitation.  4. The aortic valve is grossly normal. Aortic valve regurgitation is not visualized.  5. The inferior vena cava is normal in size with greater than 50% respiratory variability, suggesting right atrial pressure of 3 mmHg. FINDINGS  Left Ventricle: Left ventricular ejection fraction, by estimation, is 70 to 75%. The left ventricle has hyperdynamic function. The left ventricle has no regional wall motion abnormalities. The left ventricular internal cavity size was normal in size. There is mild left ventricular hypertrophy. Left ventricular diastolic parameters are consistent with Grade I diastolic dysfunction (impaired relaxation). Right Ventricle: The right ventricular size is normal. No increase in right ventricular wall thickness. Right ventricular systolic function is normal. Left Atrium: Left atrial size was normal in size. Right Atrium: Right atrial size was normal in size. Pericardium: There is no evidence of pericardial effusion. Mitral Valve: The mitral valve is normal in structure. No evidence of mitral valve regurgitation. Tricuspid Valve: The tricuspid valve is normal in structure. Tricuspid valve regurgitation is not demonstrated. Aortic Valve: The  aortic valve is grossly normal. Aortic valve regurgitation is not visualized. Aortic valve mean gradient measures 9.0 mmHg. Aortic valve peak gradient measures 16.0 mmHg. Aortic valve area, by VTI measures 3.68 cm. Pulmonic Valve: The pulmonic valve was normal in structure. Pulmonic valve regurgitation is not visualized. Aorta: The aortic root is normal in size and structure. Venous: The inferior vena cava was not well visualized. The inferior vena cava is normal in size with greater than 50% respiratory variability, suggesting right atrial pressure of 3 mmHg. IAS/Shunts: No atrial level shunt detected by color flow Doppler.  LEFT VENTRICLE PLAX 2D LVIDd:         4.60 cm   Diastology LVIDs:         2.80 cm   LV e' medial:    9.57 cm/s LV PW:         1.10 cm   LV E/e' medial:  7.9 LV IVS:        1.20 cm   LV e' lateral:   5.98 cm/s LVOT diam:     2.10 cm   LV E/e' lateral: 12.7 LV SV:         93 LV SV Index:   48 LVOT Area:     3.46 cm  RIGHT VENTRICLE RV S prime:     22.70 cm/s TAPSE (M-mode): 2.6 cm LEFT ATRIUM             Index        RIGHT ATRIUM           Index LA diam:        3.90 cm 2.00 cm/m   RA Area:     16.20 cm LA Vol (A2C):   29.6 ml 15.15 ml/m  RA Volume:   42.50 ml  21.75 ml/m LA Vol (A4C):   45.1 ml 23.08 ml/m LA Biplane Vol: 39.5 ml 20.22 ml/m  AORTIC VALVE AV Area (Vmax):    3.41 cm AV Area (Vmean):   3.07 cm AV Area (VTI):     3.68 cm AV Vmax:           200.00 cm/s AV Vmean:  133.000 cm/s AV VTI:            0.253 m AV Peak Grad:      16.0 mmHg AV Mean Grad:      9.0 mmHg LVOT Vmax:         197.00 cm/s LVOT Vmean:        118.000 cm/s LVOT VTI:          0.269 m LVOT/AV VTI ratio: 1.06  AORTA Ao Root diam: 2.80 cm MITRAL VALVE                TRICUSPID VALVE MV Area (PHT): 6.32 cm     TR Peak grad:   15.8 mmHg MV Decel Time: 120 msec     TR Vmax:        199.00 cm/s MV E velocity: 76.00 cm/s MV A velocity: 159.00 cm/s  SHUNTS MV E/A ratio:  0.48         Systemic VTI:  0.27 m                              Systemic Diam: 2.10 cm Debbe Odea MD Electronically signed by Debbe Odea MD Signature Date/Time: 12/17/2022/2:23:47 PM    Final    DG Chest 1 View  Result Date: 12/17/2022 CLINICAL DATA:  Shortness of breath. EXAM: CHEST  1 VIEW COMPARISON:  12/14/2022 FINDINGS: Single-view of the chest demonstrates slightly low lung volumes. No focal airspace disease or consolidation. No overt pulmonary edema. Heart and mediastinum are grossly stable. Patient appears to be mildly rotated on this examination. No acute bone abnormality. IMPRESSION: No active disease. Electronically Signed   By: Richarda Overlie M.D.   On: 12/17/2022 13:28   MR THORACIC SPINE W WO CONTRAST  Result Date: 12/17/2022 CLINICAL DATA:  Initial evaluation for right lower extremity weakness. EXAM: MRI THORACIC WITHOUT AND WITH CONTRAST TECHNIQUE: Multiplanar and multiecho pulse sequences of the thoracic spine were obtained without and with intravenous contrast. CONTRAST:  7mL GADAVIST GADOBUTROL 1 MMOL/ML IV SOLN COMPARISON:  None Available. FINDINGS: Alignment: Trace dextroscoliosis with mild straightening of the normal midthoracic kyphosis. No listhesis. Vertebrae: Vertebral body height maintained without acute or chronic fracture. Bone marrow signal intensity markedly heterogeneous. Few small STIR hyperintense lesions involving the teeth to through T4 vertebral bodies noted, favored to reflect atypical hemangiomata. No other worrisome osseous lesions. No other abnormal marrow edema or enhancement. Cord:  Normal signal and morphology.  No abnormal enhancement. Paraspinal and other soft tissues: Paraspinous soft tissues are within normal limits. Trace layering bilateral pleural effusions noted. Innumerable cystic lesions noted within the partially visualized liver, incompletely assessed on this exam. Disc levels: Ordinary for age multilevel disc desiccation seen throughout the thoracic spine. No significant disc bulge or focal  disc herniation. Posterior element hypertrophy noted at T6-7. No significant spinal stenosis. Foramina remain patent. No neural impingement. IMPRESSION: 1. Normal MRI appearance of the thoracic spinal cord. No findings to explain patient's symptoms identified. 2. Minor thoracic spondylosis for age. No significant stenosis or neural impingement. 3. Innumerable cystic lesions within the partially visualized liver, incompletely assessed on this exam. Again, further evaluation with dedicated MRI of the abdomen, with and without contrast, recommended for further evaluation. Electronically Signed   By: Rise Mu M.D.   On: 12/17/2022 04:40   MR CERVICAL SPINE W WO CONTRAST  Result Date: 12/16/2022 CLINICAL DATA:  Initial evaluation for weakness, hyper reflexia. EXAM: MRI CERVICAL SPINE  WITHOUT AND WITH CONTRAST TECHNIQUE: Multiplanar and multiecho pulse sequences of the cervical spine, to include the craniocervical junction and cervicothoracic junction, were obtained without and with intravenous contrast. CONTRAST:  7mL GADAVIST GADOBUTROL 1 MMOL/ML IV SOLN COMPARISON:  None Available. FINDINGS: Alignment: Examination mildly degraded by motion. Straightening of the normal cervical lordosis. No significant listhesis. Vertebrae: Vertebral body height maintained without acute or chronic fracture. Bone marrow signal intensity diffusely heterogeneous. Subcentimeter stir hyperintense lesions involving the T2 and T3 vertebral bodies noted, favored to reflect small atypical hemangiomata noted. No worrisome osseous lesions. No other abnormal marrow edema or enhancement. Cord: Normal signal and morphology. No convincing cord signal changes seen on this motion degraded exam. No abnormal enhancement. Posterior Fossa, vertebral arteries, paraspinal tissues: Patient's known cavernous ICA aneurysm noted, partially visualized. Craniocervical junction within normal limits. Paraspinous soft tissues within normal limits.  Normal flow voids seen within the vertebral arteries bilaterally. Disc levels: C2-C3: Mild disc bulge. No spinal stenosis. Foramina remain patent. C3-C4: Mild disc bulge with uncovertebral spurring. Flattening and partial effacement of the ventral thecal sac with resultant mild spinal stenosis. Mild left C4 foraminal narrowing. Right neural foramen remains patent. C4-C5: Mild diffuse disc bulge with uncovertebral spurring. Flattening and partial effacement of the ventral thecal sac. Mild cord flattening without cord signal changes. Mild spinal stenosis. Foramina remain patent. C5-C6: Mild disc bulge with uncovertebral spurring. Flattening and partial effacement of the ventral thecal sac with resultant mild spinal stenosis. Foramina remain patent. C6-C7: Degenerative intervertebral disc space narrowing with diffuse disc osteophyte complex. Posterior component flattens and partially effaces the ventral thecal sac. No more than mild spinal stenosis. Mild left C7 foraminal narrowing. Right neural foramen remains patent. C7-T1: Mild degenerative disc space narrowing with diffuse disc bulge and uncovertebral spurring. Mild bilateral facet hypertrophy. Resultant mild spinal stenosis. Mild left C8 foraminal narrowing. Right neural foramen remains patent. IMPRESSION: 1. Normal MRI appearance of the cervical spinal cord. No cord signal changes to suggest myelopathy or other abnormality. 2. Multilevel cervical spondylosis with resultant mild diffuse spinal stenosis at C3-4 through C7-T1. 3. Mild left C4, C7, and C8 foraminal stenosis related to disc bulge and uncovertebral disease. 4. 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 Electronically Signed   By: Rise Mu M.D.   On: 12/16/2022 06:21   MR LUMBAR SPINE WO CONTRAST  Result Date: 12/14/2022 CLINICAL DATA:  Initial evaluation for acute myelopathy. EXAM: MRI LUMBAR SPINE WITHOUT CONTRAST TECHNIQUE: Multiplanar,  multisequence MR imaging of the lumbar spine was performed. No intravenous contrast was administered. COMPARISON:  None Available. FINDINGS: Segmentation:  Standard. Alignment:  Physiologic. Vertebrae: Vertebral body height maintained without acute or chronic fracture. Bone marrow signal intensity diffusely heterogeneous. No worrisome osseous lesions or abnormal marrow edema. Conus medullaris and cauda equina: Conus extends to the T12 level. Conus and cauda equina appear normal. Paraspinal and other soft tissues: Paraspinous soft tissues within normal limits. Few scattered T2 hyperintense cyst noted about the kidneys, largest of which measures 2 cm on the left. These are benign in appearance, with no follow-up imaging recommended. Innumerable cystic lesions noted within the partially visualized liver. Disc levels: L1-2: Disc desiccation with minimal annular bulge. Mild facet hypertrophy. No stenosis. L2-3: Degenerative intervertebral disc space narrowing with circumferential disc bulge and disc desiccation. Mild reactive endplate spurring. Mild bilateral facet hypertrophy. No significant spinal stenosis. Foramina remain patent. L3-4: Disc desiccation with mild diffuse disc bulge. Mild reactive endplate spurring. Mild facet  and ligament flavum hypertrophy. Resultant mild-to-moderate spinal stenosis. Mild bilateral L3 foraminal narrowing. L4-5: Disc desiccation with diffuse disc bulge. Superimposed small right subarticular disc protrusion with slight inferior migration and annular fissure. Protruding disc contacts the descending right L5 nerve root in the right lateral recess (series 12, image 31). Moderate bilateral facet arthrosis. Resultant moderate canal with bilateral subarticular stenosis. Mild bilateral L4 foraminal narrowing. L5-S1:  Negative interspace.  Mild facet hypertrophy.  No stenosis. IMPRESSION: 1. Disc bulge with small right subarticular disc protrusion at L4-5, potentially affecting the descending  right L5 nerve root. Moderate canal with mild bilateral L4 foraminal stenosis at this level. 2. Disc bulge with facet hypertrophy at L3-4 with resultant mild to moderate spinal stenosis, with mild bilateral L3 foraminal narrowing. 3. Innumerable cystic lesions within the visualized liver. While these could reflect benign cysts, these are incompletely assessed on this exam. Correlation with dedicated abdominal MRI recommended for further evaluation. Electronically Signed   By: Rise Mu M.D.   On: 12/14/2022 22:30   US ABDOMEN LIMITED RUQ (LIVER/GB)  Result Date: 12/14/2022 CLINICAL DATA:  Right upper quadrant pain and elevated liver function test. EXAM: ULTRASOUND ABDOMEN LIMITED RIGHT UPPER QUADRANT COMPARISON:  None Available. FINDINGS: Gallbladder: Gallbladder is partially contracted. No gallstones or wall thickening visualized (3.3 mm). No sonographic Murphy sign noted by sonographer. Common bile duct: Diameter: 1.6 mm Liver: A 1.2 cm x 1.0 cm x 1.2 cm hypoechoic area is seen within the right lobe of the liver. The hepatic parenchyma is nodular in contour and coarse in echogenicity. Portal vein is patent on color Doppler imaging with normal direction of blood flow towards the liver. Other: None. IMPRESSION: 1. Hepatic cirrhosis. 2. 1.2 cm x 1.0 cm x 1.2 cm hypoechoic liver lesion, as described above. MRI correlation is recommended to further exclude the presence of an underlying neoplastic process. Electronically Signed   By: Aram Candela M.D.   On: 12/14/2022 18:10   MR BRAIN WO CONTRAST  Result Date: 12/14/2022 CLINICAL DATA:  Neuro deficit, acute, stroke suspected. Weakness over the last 3 weeks EXAM: MRI HEAD WITHOUT CONTRAST TECHNIQUE: Multiplanar, multiecho pulse sequences of the brain and surrounding structures were obtained without intravenous contrast. COMPARISON:  Head CT same day.  MRI 05/18/2020. FINDINGS: Brain: Diffusion imaging does not show any acute or subacute  infarction. Mild chronic small-vessel ischemic change affects pons. 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 Cerebral hemispheres show moderate to marked chronic small-vessel ischemic changes of the white matter, similar to the prior exam. No large vessel territory stroke. No mass lesion, hemorrhage, hydrocephalus or extra-axial collection. 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 Skull and upper cervical spine: Negative Sinuses/Orbits: Clear/normal Other: None IMPRESSION: 1. No acute finding. Moderate to marked chronic small-vessel ischemic changes of the pons and cerebral hemispheric white matter, similar to the study of 05/18/2020. 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 2. 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 Electronically Signed   By: Paulina Fusi M.D.   On: 12/14/2022 13:53   CT Head Wo Contrast  Result Date: 12/14/2022 CLINICAL DATA:  Neuro deficit, acute, stroke suspected. Weakness with inability to raise the right leg. Recent falls. EXAM: CT HEAD WITHOUT CONTRAST TECHNIQUE: Contiguous axial images were obtained from the base of the skull through the vertex without intravenous contrast. RADIATION DOSE REDUCTION: This exam was performed according to the  departmental dose-optimization program which includes automated exposure control, adjustment of the mA and/or kV according to patient size and/or use of iterative reconstruction technique. COMPARISON:  Head and neck CTA 05/19/2020 FINDINGS: Brain: There is no evidence of an acute infarct, intracranial hemorrhage, mass, midline shift, or extra-axial fluid collection. Mild cerebral atrophy is within normal limits for age. Patchy hypodensities in the cerebral white matter are similar to the prior study and are nonspecific but compatible with moderate chronic small vessel ischemic disease.  Vascular: Calcified atherosclerosis at the skull base. Known 1.4 cm peripherally calcified right cavernous ICA aneurysm, similar in size to the prior CTA. Skull: No acute fracture or suspicious osseous lesion. Sinuses/Orbits: Visualized paranasal sinuses and mastoid air cells are clear. Unremarkable orbits. Other: None. IMPRESSION: 1. No evidence of acute intracranial abnormality. 2. Moderate chronic small vessel ischemic disease. 3. Known right cavernous ICA aneurysm. Electronically Signed   By: Sebastian Ache M.D.   On: 12/14/2022 11:51   DG Chest Port 1 View  Result Date: 12/14/2022 CLINICAL DATA:  Weakness. EXAM: PORTABLE CHEST 1 VIEW COMPARISON:  Chest radiograph 06/01/2020 and CT 06/09/2020 FINDINGS: The cardiomediastinal silhouette is unchanged with normal heart size. Minor bilateral lung scarring is noted. No acute airspace consolidation, edema, sizable pleural effusion, or pneumothorax is identified. No acute osseous abnormality is seen. IMPRESSION: No active disease. Electronically Signed   By: Sebastian Ache M.D.   On: 12/14/2022 11:46    Microbiology: Recent Results (from the past 240 hour(s))  SARS Coronavirus 2 by RT PCR (hospital order, performed in [facility] hospital lab) *cepheid single result test* Anterior Nasal Swab     Status: None   Collection Time: 12/14/22  2:25 PM   Specimen: Anterior Nasal Swab  Result Value Ref Range Status   SARS Coronavirus 2 by RT PCR NEGATIVE NEGATIVE Final    Comment: (NOTE) SARS-CoV-2 target nucleic acids are NOT DETECTED.  The SARS-CoV-2 RNA is generally detectable in upper and lower respiratory specimens during the acute phase of infection. The lowest concentration of SARS-CoV-2 viral copies this assay can detect is 250 copies / mL. A negative result does not preclude SARS-CoV-2 infection and should not be used as the sole basis for treatment or other patient management decisions.  A negative result may occur with improper specimen collection /  handling, submission of specimen other than nasopharyngeal swab, presence of viral mutation(s) within the areas targeted by this assay, and inadequate number of viral copies (<250 copies / mL). A negative result must be combined with clinical observations, patient history, and epidemiological information.  Fact Sheet for Patients:   RoadLapTop.co.za  Fact Sheet for Healthcare Providers: http://kim-miller.com/  This test is not yet approved or  cleared by the [place] FDA and has been authorized for detection and/or diagnosis of SARS-CoV-2 by FDA under an Emergency Use Authorization (EUA).  This EUA will remain in effect (meaning this test can be used) for the duration of the COVID-19 declaration under Section 564(b)(1) of the Act, 21 U.S.C. section 360bbb-3(b)(1), unless the authorization is terminated or revoked sooner.  Performed at [facility], [address] [place], [place] [address]      Labs: CBC: Recent Labs  Lab 12/15/22 0222 12/18/22 0456 12/19/22 0427 12/20/22 0421 12/21/22 0402  WBC 7.2 13.0* 12.1* 11.8* 10.1  HGB 11.3* 10.8* 10.1* 10.2* 10.4*  HCT 34.5* 32.5* 28.6* 29.9* 31.6*  MCV 82.7 80.6 78.1* 78.7* 81.7  PLT 310 369 351 392 347   Basic Metabolic  Panel: Recent Labs  Lab 12/17/22 0325 12/18/22 0456 12/19/22 0427 12/20/22 0421 12/21/22 0402  NA 134* 136 135 137 136  K 3.8 3.9 4.1 4.2 4.1  CL 103 102 105 104 104  CO2 24 23 26 23 24   GLUCOSE 123* 114* 107* 99 92  BUN 28* 38* 39* 37* 34*  CREATININE 1.29* 1.53* 1.58* 1.51* 1.46*  CALCIUM 10.1 10.4* 9.8 10.3 9.9  MG  --   --  2.2 2.3 2.5*  PHOS  --   --  2.7 2.7 2.9   Liver Function Tests: Recent Labs  Lab 12/17/22 0325 12/18/22 0456 12/19/22 0427 12/20/22 0421 12/21/22 0402  AST 148* 94* 88* 119* 305*  ALT 48* 40 40 51* 96*  ALKPHOS 211* 209* 226* 251* 286*  BILITOT 1.2 1.3* 0.8 0.8 1.4*  PROT 6.4* 6.9 6.3* 6.5 6.5  ALBUMIN  2.9* 3.0* 2.7* 2.9* 2.9*   Recent Labs  Lab 12/14/22 2032  LIPASE 25   No results for input(s): "AMMONIA" in the last 168 hours. Cardiac Enzymes: No results for input(s): "CKTOTAL", "CKMB", "CKMBINDEX", "TROPONINI" in the last 168 hours. BNP (last 3 results) Recent Labs    12/17/22 0325  BNP 91.9   CBG: No results for input(s): "GLUCAP" in the last 168 hours.  Time spent: 35 minutes  Signed:  Gillis Santa  [facility] 12/21/2022 12:19 PM

## 2022-12-21 NOTE — Progress Notes (Signed)
Stephanie [name]   DOB:1956-05-04   QM#:[id number]    Subjective: pt is s/p liver biopsy. Denies any worsening pain. No cough or shortness of breath.   Objective:  Vitals:   12/21/22 0839 12/21/22 1300  BP: 136/85 (!) 141/79  Pulse: (!) 107 100  Resp: 18   Temp: 98.1 F (36.7 C)   SpO2: 95%      Intake/Output Summary (Last 24 hours) at 12/21/2022 1531 Last data filed at 12/21/2022 1429 Gross per 24 hour  Intake 720 ml  Output --  Net 720 ml   Chronic Right lower extremity weakness.  Physical Exam Vitals and nursing note reviewed.  Constitutional:      Comments:     HENT:     Head: Normocephalic and atraumatic.     Mouth/Throat:     Mouth: Mucous membranes are moist.     Pharynx: No oropharyngeal exudate.  Eyes:     Pupils: Pupils are equal, round, and reactive to light.  Cardiovascular:     Rate and Rhythm: Normal rate and regular rhythm.  Pulmonary:     Effort: No respiratory distress.     Breath sounds: No wheezing.     Comments: Decreased breath sounds bilaterally at bases.  No wheeze or crackles Abdominal:     General: Bowel sounds are normal. There is no distension.     Palpations: Abdomen is soft. There is no mass.     Tenderness: There is no abdominal tenderness. There is no guarding or rebound.  Musculoskeletal:        General: No tenderness. Normal range of motion.     Cervical back: Normal range of motion and neck supple.  Skin:    General: Skin is warm.  Neurological:     Mental Status: She is alert and oriented to person, place, and time.  Psychiatric:        Mood and Affect: Affect normal.        Judgment: Judgment normal.      Labs:  Lab Results  Component Value Date   WBC 10.1 12/21/2022   HGB 10.4 (L) 12/21/2022   HCT 31.6 (L) 12/21/2022   MCV 81.7 12/21/2022   PLT 347 12/21/2022   NEUTROABS 5.6 06/07/2020    Lab Results  Component Value Date   NA 136 12/21/2022   K 4.1 12/21/2022   CL 104 12/21/2022   CO2 24 12/21/2022     Studies:  IR US LIVER BIOPSY  Result Date: 12/20/2022 INDICATION: Liver masses.  No diagnosis. EXAM: ULTRASOUND GUIDED LIVER MASS BIOPSY COMPARISON:  MRI abdomen 12/18/2022 MEDICATIONS: None ANESTHESIA/SEDATION: Moderate (conscious) sedation was employed during this procedure. A total of Versed 1 mg and Fentanyl 50 mcg was administered intravenously. Moderate Sedation Time: 10 minutes. The patient's level of consciousness and vital signs were monitored continuously by radiology nursing throughout the procedure under my direct supervision. COMPLICATIONS: None immediate. PROCEDURE: Informed written consent was obtained from insufficient wrap after a discussion of the risks, benefits and alternatives to treatment. The patient understands and consents the procedure. A timeout was performed prior to the initiation of the procedure. Ultrasound scanning was performed of the upper abdominal quadrant demonstrates tumor replacement of the LEFT hepatic lobe The LEFT hepatic lobe was selected for biopsy and the procedure was planned. The upper abdominal quadrant was prepped and draped in the usual sterile fashion. The overlying soft tissues were anesthetized with 1% lidocaine. A 17 gauge, 6.8 cm co-axial needle was advanced into a peripheral  aspect of the lesion. This was followed by 4 core biopsies with an 18 gauge core device under direct ultrasound guidance. The coaxial needle tract was embolized with a small amount of Gel-Foam slurry and superficial hemostasis was obtained with manual compression. Post procedural scanning was negative for definitive area of hemorrhage or additional complication. A dressing was placed. The patient tolerated the procedure well without immediate post procedural complication. IMPRESSION: Successful ultrasound guided core needle biopsy of liver mass. Stephanie Banning, MD [facility] Electronically Signed   By: Stephanie [name] M.D.   On:  12/20/2022 13:26   US Abdomen Limited RUQ (LIVER/GB)  Result Date: 12/20/2022 CLINICAL DATA:  [id number] Liver mass [id number] EXAM: ULTRASOUND ABDOMEN LIMITED RIGHT UPPER QUADRANT COMPARISON:  CT chest, 12/18/2022. MR abdomen, 12/18/2022. US Abdomen, 12/14/2022. FINDINGS: Gallbladder: No gallstones or wall thickening visualized. No sonographic Murphy sign noted by sonographer. Common bile duct: Diameter: 0.5 cm Liver: Nodular contour liver. Heterogeneous hepatic parenchymal echogenicity, with innumerable liver lesions scattered within the RIGHT and LEFT lobes of the liver. Single largest lesion identified within the RIGHT lobe measures up to 2.7 cm. Portal vein is patent on color Doppler imaging with normal direction of blood flow towards the liver. Other: No perihepatic ascites. IMPRESSION: 1. Innumerable lesions, scattered within the liver. 2. No biliary ductal dilatation. Electronically Signed   By: Stephanie [name] M.D.   On: 12/20/2022 12:10    Stephanie [name] 67 y.o.  female with no prior history of any malignancy with History of CVA x2, type 2 diabetes,-is currently admitted to hospital for worsening right lower extremity weakness. Abdominal imaging/Chest CT findings concerning for malignancy.    # MRI liver- multiple liver lesions. CT scan chest shows- LEFT breast mass; axillary LN noted- concerning for breast cancer with wide spread metastases- currently s/p  liver tbiopsy on 12/20/2022.  Cancer markers pending.    # Hx of CVA/ with mild residual deficits/ with limited limited mobility-walker.   # DNR/DNI- recommend palliative care. Discussed with [employer].  # plan follow up In cancer center- in 1 week; MD-labs- cbc/cmp;Dr.B  Stephanie Coder, MD 12/21/2022  3:31 PM

## 2022-12-22 ENCOUNTER — Telehealth: Payer: Self-pay | Admitting: Internal Medicine

## 2022-12-22 NOTE — Telephone Encounter (Signed)
I tried to reach the patient's Stephanie [name] and Stephanie [name].  Unable to reach; did not leave a voicemail. Discussed with Dr.Rubinas-positive for carcinoma; awaiting receptor status.

## 2022-12-24 ENCOUNTER — Telehealth: Payer: Self-pay | Admitting: *Deleted

## 2022-12-24 NOTE — Telephone Encounter (Signed)
Patient's sister called to get results from patient's recent biopsy.

## 2022-12-24 NOTE — Telephone Encounter (Signed)
Spoke to patient's sister-Katrina regarding results of the biopsy-positive for cancer.  Reminded the sister of the appointment time; she will call [facility] for transportation.   Thanks, GB

## 2022-12-25 IMAGING — CT CT ANGIO CHEST
2 of 7 series · 18 of 36 positions shown · IV contrast (APPLIED)
Comparison: None.

CLINICAL DATA: Shortness of breath.

EXAM:
CT ANGIOGRAPHY CHEST WITH CONTRAST
TECHNIQUE: Multidetector CT imaging of the chest was performed using the
standard protocol during bolus administration of intravenous
contrast. Multiplanar CT image reconstructions and MIPs were
obtained to evaluate the vascular anatomy.
CONTRAST:  75mL OMNIPAQUE IOHEXOL 350 MG/ML SOLN

[Series 5: thins · axial · 0.69mm/px · z∈[-596,-330]mm · 15 of 306 slices shown]
[im 20/306  lung]
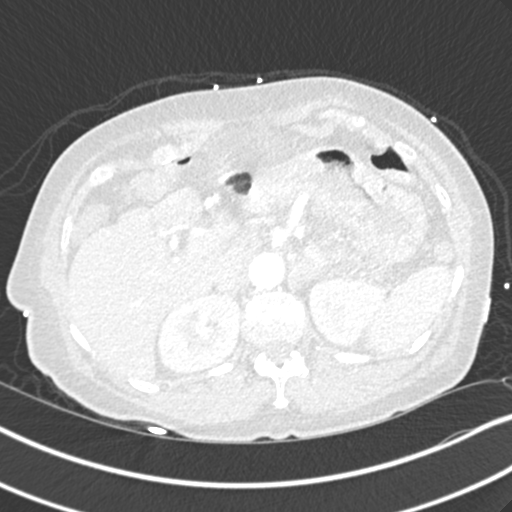
[im 39/306  mediastinal]
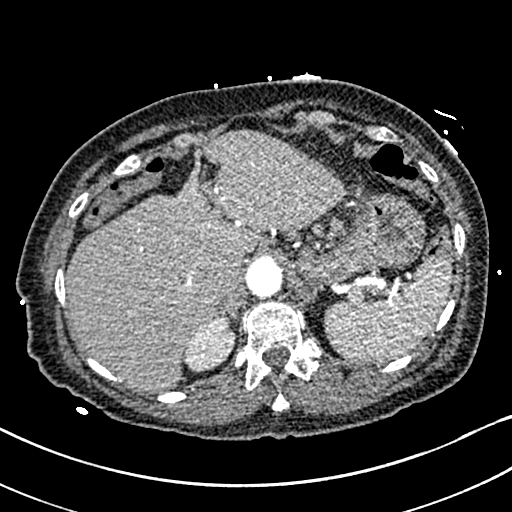
[im 58/306  lung]
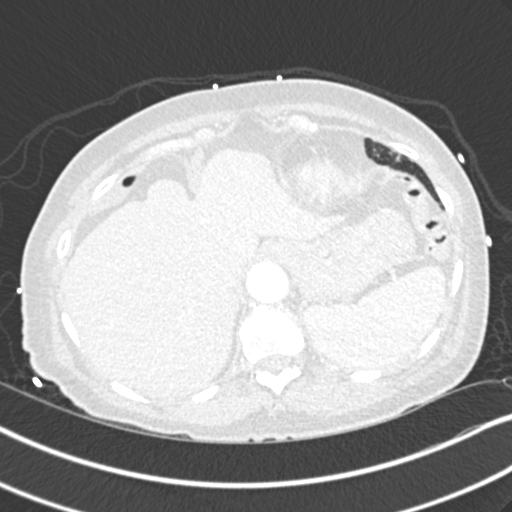
[im 77/306  mediastinal]
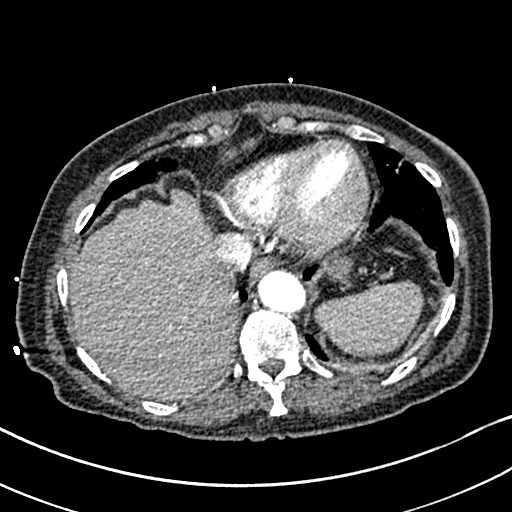
[im 96/306  lung]
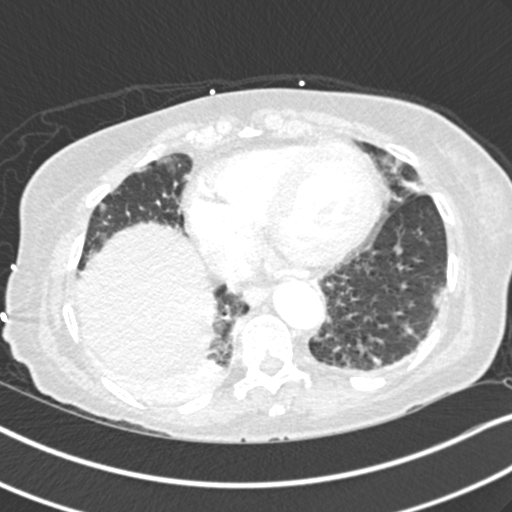
[im 115/306  mediastinal]
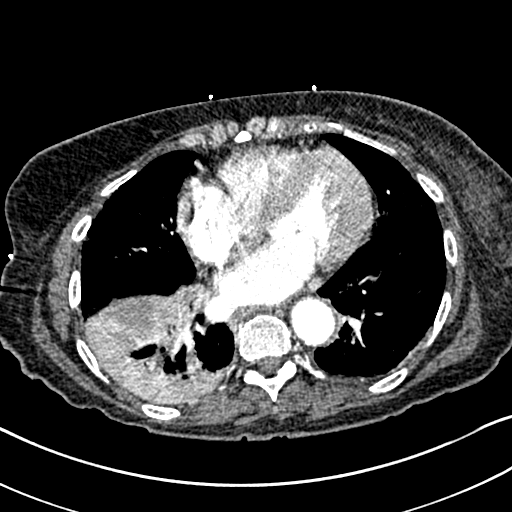
[im 134/306  lung]
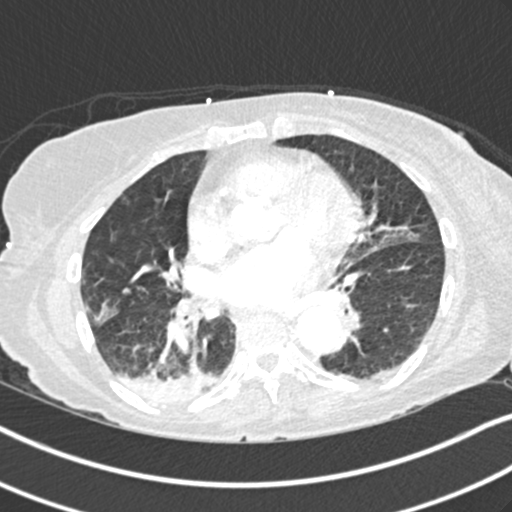
[im 153/306  mediastinal]
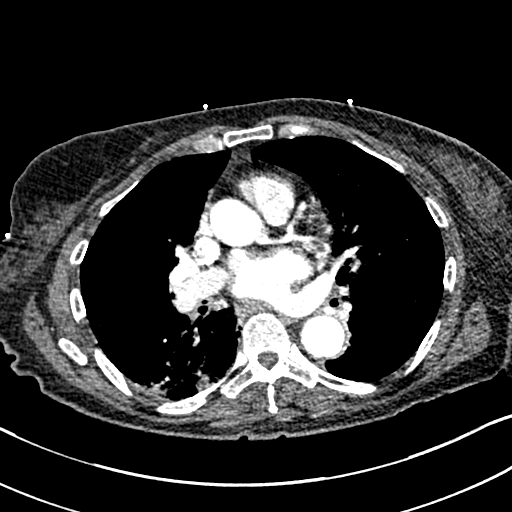
[im 172/306  lung]
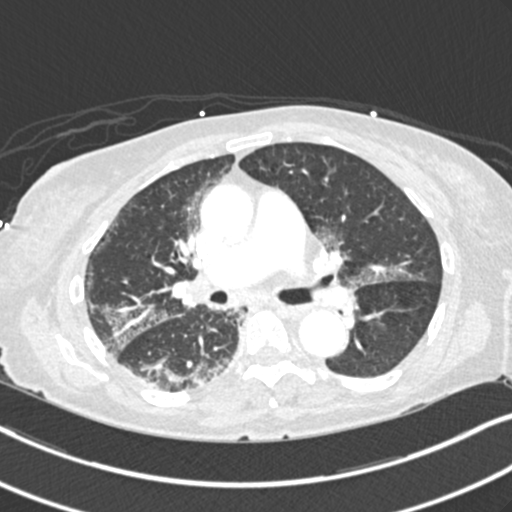
[im 191/306  mediastinal]
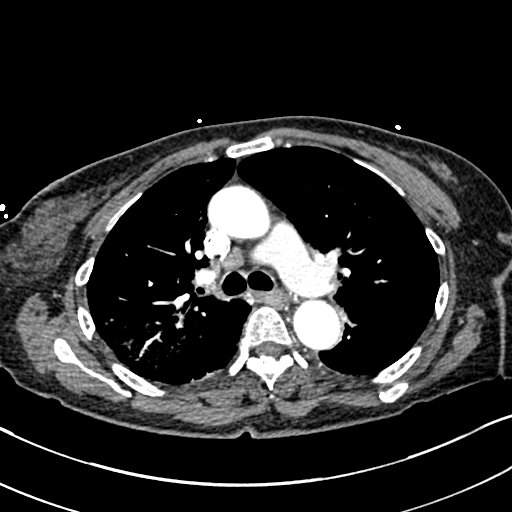
[im 210/306  lung]
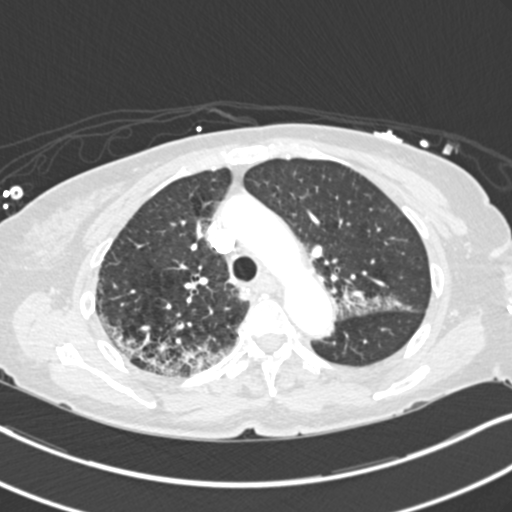
[im 229/306  mediastinal]
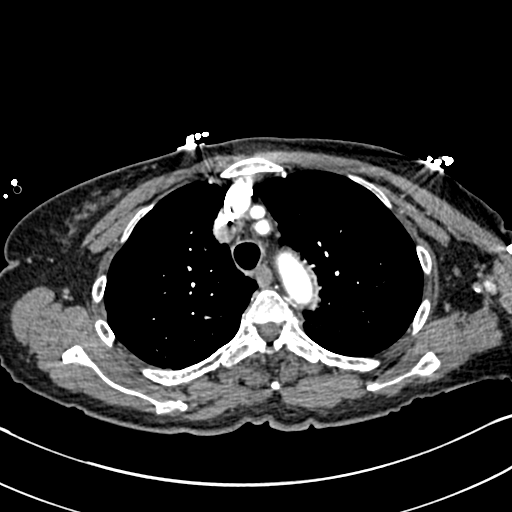
[im 248/306  lung]
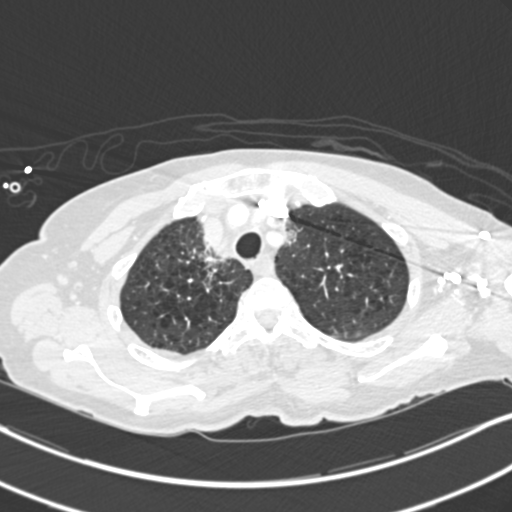
[im 267/306  mediastinal]
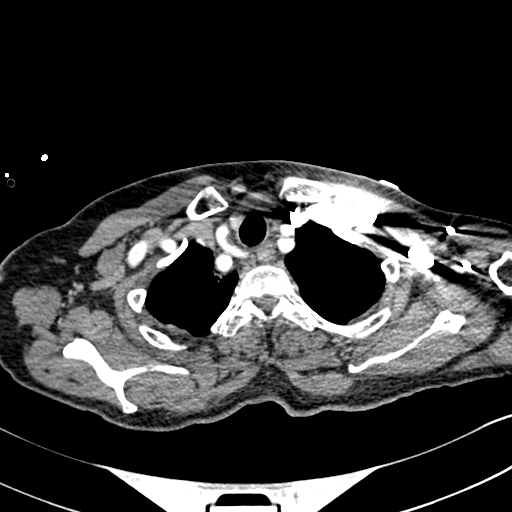
[im 286/306  lung]
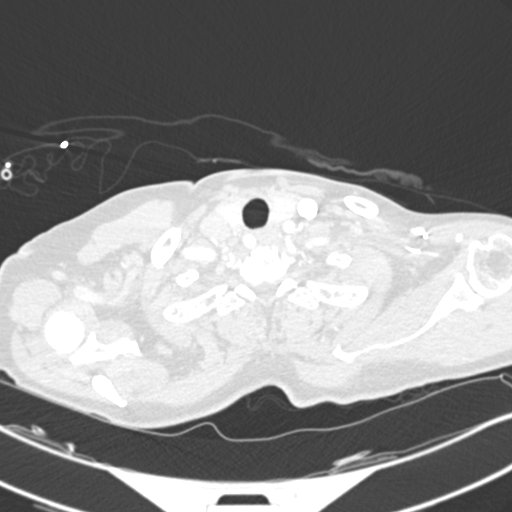

[Series 6: lung · axial · 0.69mm/px · z∈[-499,-373]mm · 3 of 84 slices shown]
[im 21/84  mediastinal]
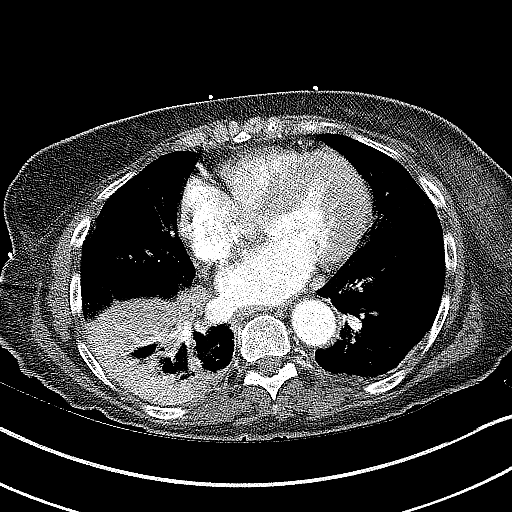
[im 42/84  mediastinal]
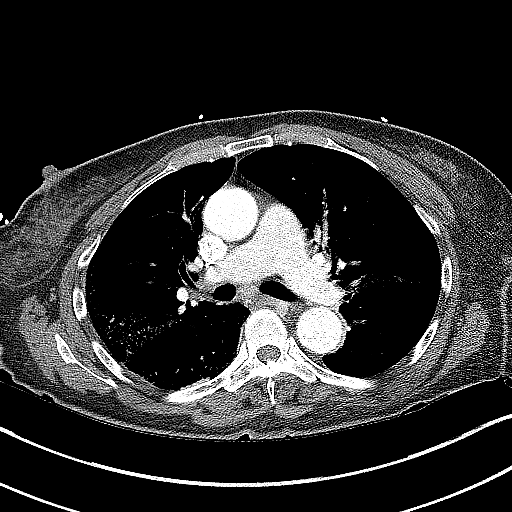
[im 63/84  mediastinal]
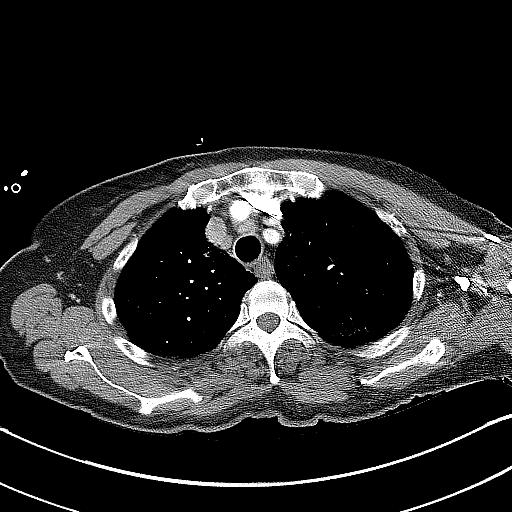

[18 of 36 positions shown; findings below may reference images not displayed]

FINDINGS: Cardiovascular: Satisfactory opacification of the pulmonary arteries
to the segmental level. No evidence of pulmonary embolism. Normal
heart size. No pericardial effusion.

Mediastinum/Nodes: No enlarged mediastinal, hilar, or axillary lymph
nodes. Thyroid gland, trachea, and esophagus demonstrate no
significant findings.

Lungs/Pleura: No pneumothorax or pleural effusion is noted. Right
lower lobe atelectasis or pneumonia is noted. Patchy airspace
opacities are noted throughout the other lobes bilaterally
suggesting subsegmental atelectasis or multifocal inflammation.

Upper Abdomen: No acute abnormality.

Musculoskeletal: No chest wall abnormality. No acute or significant
osseous findings.

Review of the MIP images confirms the above findings.
IMPRESSION: 1. No definite evidence of pulmonary embolus.
2. Right lower lobe atelectasis or pneumonia is noted. Patchy
airspace opacities are noted throughout the other lobes bilaterally
suggesting subsegmental atelectasis or multifocal inflammation.

## 2022-12-27 DIAGNOSIS — Z170 Estrogen receptor positive status [ER+]: Secondary | ICD-10-CM | POA: Insufficient documentation

## 2022-12-27 NOTE — Progress Notes (Unsigned)
[facility] CONSULT NOTE  Patient Care Team: Center, [facility] as PCP - General (General Practice) Lorn Junes, FNP (Family Medicine) [place], Carlye Grippe, RN Earna Coder, MD as Consulting Physician (Oncology) Hulen Luster, RN as Oncology Nurse Navigator  CHIEF COMPLAINTS/PURPOSE OF CONSULTATION: breast cancer  Oncology History Overview Note  # JULY 2024-liver Biopsy [in pt-[facility]]- [facility];   Estrogen Receptor: 90%, POSITIVE, STRONG STAINING INTENSITY Progesterone Receptor: 80%, POSITIVE, STRONG STAINING INTENSITY Proliferation Marker Ki67: 40%; The tumor cells are EQUIVOCAL for Her2 (2+). Her2 by FISH-NEGATIVE  # AUG 13th, 2024-Letrozole + RIbo   # Hx of stroke    Malignant neoplasm of overlapping sites of left breast in female, estrogen receptor positive (HCC)  12/27/2022 Initial Diagnosis   Malignant neoplasm of overlapping sites of left breast in female, estrogen receptor positive (HCC)   12/27/2022 Cancer Staging   Staging form: Breast, AJCC 8th Edition - Clinical: Stage IV (cT2, cN1, cM1, G2, ER+, PR+, HER2-) - Signed by Earna Coder, MD on 12/27/2022 Histologic grading system: 3 grade system    HISTORY OF PRESENTING ILLNESS: Patient ambulating-with assistance/ in wheel chair.  Accompanied by family.   Stephanie [name] 67 y.o.  female medical history significant of hypertension, tobacco use, hyperlipidemia, CVA, type 2 diabetes-was recently admitted to hospital for right lower extremity worsening weakness/falls.  Patient underwent extensive workup with neurology and neurosurgery-MRI of the spine without contrast did not show any evidence of any spinal cord lesion.  However did show multiple lesions to the vertebral column.  Subsequently noted to have liver metastases and also left breast cancer diagnosed adenopathy.  Patient subsequently underwent liver biopsy.  Patient is currently in rehab.  Her appetite is about 50%. c/o rt  foot falling asleep at times.  Denies any nausea vomiting headache.  Review of Systems  Constitutional:  Positive for malaise/fatigue and weight loss. Negative for chills, diaphoresis and fever.  HENT:  Negative for nosebleeds and sore throat.   Eyes:  Negative for double vision.  Respiratory:  Negative for cough, hemoptysis, sputum production, shortness of breath and wheezing.   Cardiovascular:  Negative for chest pain, palpitations, orthopnea and leg swelling.  Gastrointestinal:  Negative for abdominal pain, blood in stool, constipation, diarrhea, heartburn, melena, nausea and vomiting.  Genitourinary:  Negative for dysuria, frequency and urgency.  Musculoskeletal:  Positive for joint pain. Negative for back pain.  Skin: Negative.  Negative for itching and rash.  Neurological:  Positive for focal weakness. Negative for dizziness, tingling, weakness and headaches.  Endo/Heme/Allergies:  Does not bruise/bleed easily.  Psychiatric/Behavioral:  Negative for depression. The patient is not nervous/anxious and does not have insomnia.     MEDICAL HISTORY:  Past Medical History:  Diagnosis Date   Diabetes mellitus without complication (HCC)    Hypertension    Stroke (HCC) 05/17/2020    SURGICAL HISTORY: Past Surgical History:  Procedure Laterality Date   ABDOMINAL HYSTERECTOMY     at 68 years old   CLOSED REDUCTION MANDIBLE Bilateral 05/23/2020   Procedure: CLOSED REDUCTION MANDIBULAR;  Surgeon: Vernie Murders, MD;  Location: [facility] ORS;  Service: ENT;  Laterality: Bilateral;   CLOSED REDUCTION MANDIBLE N/A 06/03/2020   Procedure: CLOSED REDUCTION MANDIBULAR;  Surgeon: Linus Salmons, MD;  Location: [facility] ORS;  Service: ENT;  Laterality: N/A;   COLONOSCOPY WITH PROPOFOL N/A 06/10/2020   Procedure: COLONOSCOPY WITH PROPOFOL;  Surgeon: Pasty Spillers, MD;  Location: [facility] ENDOSCOPY;  Service: Endoscopy;  Laterality: N/A;  IR US LIVER BIOPSY  12/20/2022    SOCIAL HISTORY: Social  History   Socioeconomic History   Marital status: Single    Spouse name: Not on file   Number of children: Not on file   Years of education: Not on file   Highest education level: Not on file  Occupational History   Not on file  Tobacco Use   Smoking status: Former    Current packs/day: 0.00    Types: Cigarettes    Quit date: 04/2020    Years since quitting: 2.7   Smokeless tobacco: Never  Vaping Use   Vaping status: Never Used  Substance and Sexual Activity   Alcohol use: Never   Drug use: Never   Sexual activity: Not on file  Other Topics Concern   Not on file  Social History Narrative   Not on file   Social Determinants of Health   Financial Resource Strain: Not on file  Food Insecurity: No Food Insecurity (12/15/2022)   Hunger Vital Sign    Worried About Running Out of Food in the Last Year: Never true    Ran Out of Food in the Last Year: Never true  Transportation Needs: No Transportation Needs (12/15/2022)   PRAPARE - Administrator, Civil Service (Medical): No    Lack of Transportation (Non-Medical): No  Physical Activity: Not on file  Stress: Not on file  Social Connections: Not on file  Intimate Partner Violence: Not At Risk (12/15/2022)   Humiliation, Afraid, Rape, and Kick questionnaire    Fear of Current or Ex-Partner: No    Emotionally Abused: No    Physically Abused: No    Sexually Abused: No    FAMILY HISTORY: Family History  Problem Relation Age of Onset   COPD Mother    Hypertension Mother    Breast cancer Paternal Aunt    Stroke Maternal Grandmother    Hypertension Maternal Grandmother     ALLERGIES:  is allergic to codeine and lisinopril.  MEDICATIONS:  Current Outpatient Medications  Medication Sig Dispense Refill   bisacodyl (DULCOLAX) 5 MG EC tablet Take 2 tablets (10 mg total) by mouth daily as needed for moderate constipation.     Ipratropium-Albuterol (COMBIVENT) 20-100 MCG/ACT AERS respimat Inhale 1 puff into the  lungs every 6 (six) hours. 1 each 0   amLODipine (NORVASC) 10 MG tablet TAKE ONE TABLET BY MOUTH ONCE EVERY DAY FOR HIGH BLOOD PRESSURE 90 tablet 2   hydrALAZINE (APRESOLINE) 50 MG tablet TAKE ONE TABLET BY MOUTH 3 TIMES A DAY 270 tablet 2   letrozole (FEMARA) 2.5 MG tablet Take 1 tablet (2.5 mg total) by mouth daily. 30 tablet 3   pantoprazole (PROTONIX) 40 MG tablet TAKE ONE TABLET BY MOUTH 2 TIMES A DAY 180 tablet 0   polyethylene glycol (MIRALAX / GLYCOLAX) 17 g packet Take 17 g by mouth 2 (two) times daily. (Patient not taking: Reported on 12/28/2022)     No current facility-administered medications for this visit.    PHYSICAL EXAMINATION:   Vitals:   12/28/22 0911  BP: 114/78  Pulse: (!) 115  Temp: 97.6 F (36.4 C)  SpO2: 96%   Filed Weights   12/28/22 0911  Weight: 188 lb (85.3 kg)   Chronic weakness of the right lower extremity/Hx of stroke.   Physical Exam Vitals and nursing note reviewed.  HENT:     Head: Normocephalic and atraumatic.     Mouth/Throat:     Pharynx: Oropharynx is  clear.  Eyes:     Extraocular Movements: Extraocular movements intact.     Pupils: Pupils are equal, round, and reactive to light.  Cardiovascular:     Rate and Rhythm: Normal rate and regular rhythm.  Pulmonary:     Comments: Decreased breath sounds bilaterally.  Abdominal:     Palpations: Abdomen is soft.  Musculoskeletal:        General: Normal range of motion.     Cervical back: Normal range of motion.  Skin:    General: Skin is warm.  Neurological:     Mental Status: She is alert and oriented to person, place, and time.  Psychiatric:        Behavior: Behavior normal.        Judgment: Judgment normal.     LABORATORY DATA:  I have reviewed the data as listed Lab Results  Component Value Date   WBC 12.4 (H) 12/28/2022   HGB 11.1 (L) 12/28/2022   HCT 33.7 (L) 12/28/2022   MCV 81.2 12/28/2022   PLT 369 12/28/2022   Recent Labs    12/19/22 0427 12/20/22 0421  12/21/22 0402 12/28/22 0922  NA 135 137 136 136  K 4.1 4.2 4.1 3.4*  CL 105 104 104 105  CO2 26 23 24 22   GLUCOSE 107* 99 92 106*  BUN 39* 37* 34* 21  CREATININE 1.58* 1.51* 1.46* 1.25*  CALCIUM 9.8 10.3 9.9 10.4*  GFRNONAA 36* 38* 39* 48*  PROT 6.3* 6.5 6.5 7.5  ALBUMIN 2.7* 2.9* 2.9* 3.2*  AST 88* 119* 305* 133*  ALT 40 51* 96* 50*  ALKPHOS 226* 251* 286* 418*  BILITOT 0.8 0.8 1.4* 1.4*  BILIDIR 0.4* 0.4* 0.4*  --   IBILI 0.4 0.4 1.0*  --     RADIOGRAPHIC STUDIES: I have personally reviewed the radiological images as listed and agreed with the findings in the report. IR US LIVER BIOPSY  Result Date: 12/20/2022 INDICATION: Liver masses.  No diagnosis. EXAM: ULTRASOUND GUIDED LIVER MASS BIOPSY COMPARISON:  MRI abdomen 12/18/2022 MEDICATIONS: None ANESTHESIA/SEDATION: Moderate (conscious) sedation was employed during this procedure. A total of Versed 1 mg and Fentanyl 50 mcg was administered intravenously. Moderate Sedation Time: 10 minutes. The patient's level of consciousness and vital signs were monitored continuously by radiology nursing throughout the procedure under my direct supervision. COMPLICATIONS: None immediate. PROCEDURE: Informed written consent was obtained from insufficient wrap after a discussion of the risks, benefits and alternatives to treatment. The patient understands and consents the procedure. A timeout was performed prior to the initiation of the procedure. Ultrasound scanning was performed of the upper abdominal quadrant demonstrates tumor replacement of the LEFT hepatic lobe The LEFT hepatic lobe was selected for biopsy and the procedure was planned. The upper abdominal quadrant was prepped and draped in the usual sterile fashion. The overlying soft tissues were anesthetized with 1% lidocaine. A 17 gauge, 6.8 cm co-axial needle was advanced into a peripheral aspect of the lesion. This was followed by 4 core biopsies with an 18 gauge core device under direct  ultrasound guidance. The coaxial needle tract was embolized with a small amount of Gel-Foam slurry and superficial hemostasis was obtained with manual compression. Post procedural scanning was negative for definitive area of hemorrhage or additional complication. A dressing was placed. The patient tolerated the procedure well without immediate post procedural complication. IMPRESSION: Successful ultrasound guided core needle biopsy of liver mass. [name], MD [facility] Electronically Signed  By: [name] M.D.   On: 12/20/2022 13:26   US Abdomen Limited RUQ (LIVER/GB)  Result Date: 12/20/2022 CLINICAL DATA:  [id number] Liver mass [id number] EXAM: ULTRASOUND ABDOMEN LIMITED RIGHT UPPER QUADRANT COMPARISON:  CT chest, 12/18/2022. MR abdomen, 12/18/2022. US Abdomen, 12/14/2022. FINDINGS: Gallbladder: No gallstones or wall thickening visualized. No sonographic Murphy sign noted by sonographer. Common bile duct: Diameter: 0.5 cm Liver: Nodular contour liver. Heterogeneous hepatic parenchymal echogenicity, with innumerable liver lesions scattered within the RIGHT and LEFT lobes of the liver. Single largest lesion identified within the RIGHT lobe measures up to 2.7 cm. Portal vein is patent on color Doppler imaging with normal direction of blood flow towards the liver. Other: No perihepatic ascites. IMPRESSION: 1. Innumerable lesions, scattered within the liver. 2. No biliary ductal dilatation. Electronically Signed   By: [name] M.D.   On: 12/20/2022 12:10   CT CHEST WO CONTRAST  Result Date: 12/18/2022 CLINICAL DATA:  Evaluate for occult malignancy EXAM: CT CHEST WITHOUT CONTRAST TECHNIQUE: Multidetector CT imaging of the chest was performed following the standard protocol without IV contrast. RADIATION DOSE REDUCTION: This exam was performed according to the departmental dose-optimization program which includes automated exposure control, adjustment  of the mA and/or kV according to patient size and/or use of iterative reconstruction technique. COMPARISON:  06/09/2020 FINDINGS: Cardiovascular: Scattered calcifications are seen in thoracic aorta. There is ectasia of right main pulmonary artery measuring 3.4 cm. Mediastinum/Nodes: There is 1.8 x 1.2 cm lymph node at the right cardiophrenic angle. There are enlarged lymph nodes in left axilla measuring up to 1.4 cm in short axis. There is 4.5 x 3.4 cm soft tissue mass in the upper aspect of left breast. There is skin thickening adjacent to this mass. Lungs/Pleura: Centrilobular emphysema is seen. Small linear patchy infiltrate is seen in right lower lobe. Increased markings are seen in the posterior segment of right upper lobe. In image 59 of series 4, there is 3 mm pleural-based nodule in left upper lobe. In image 50, there is a 2 mm nodule in the lateral aspect of right upper lobe. In image 68, there is a 3 mm nodule inseparable from the posterior aspect of minor fissure. There is no pleural effusion or pneumothorax. Upper Abdomen: Lobulations are seen in the margin of the liver. There are multiple low-density space-occupying lesions suggesting hepatic metastatic disease. There is 2 mm calcific density in the upper pole of left kidney, possibly left renal stone. Musculoskeletal: No focal lytic or sclerotic lesions are seen. IMPRESSION: There is 4.5 x 3.4 cm new soft tissue mass in the upper portion of left breast. This may suggest primary or metastatic malignant neoplasm. There are abnormally enlarged lymph nodes in left axilla suggesting metastatic lymphadenopathy. There is 1.8 x 1.2 cm lymph node at the right cardiophrenic angle suggesting possible metastatic lymphadenopathy. PET-CT and tissue sampling may be considered. There are a few scattered tiny nodular densities in both lungs measuring up to 3 mm. This finding may suggest incidental granulomas or early pulmonary metastatic disease. There are  space-occupying lesions in liver suggesting hepatic metastatic disease. Liver margin is lobulated suggesting possible cirrhosis. There is 2 mm left renal calculus. Electronically Signed   By: Ernie Avena M.D.   On: 12/18/2022 11:29   MR ABDOMEN W WO CONTRAST  Result Date: 12/18/2022 CLINICAL DATA:  Liver lesions, cirrhosis EXAM: MRI ABDOMEN WITHOUT AND WITH CONTRAST TECHNIQUE: Multiplanar multisequence MR imaging of the abdomen was performed both before and after the administration of  intravenous contrast. CONTRAST:  7mL GADAVIST GADOBUTROL 1 MMOL/ML IV SOLN COMPARISON:  MR thoracic spine, 12/17/2022, CT chest abdomen pelvis, 06/09/2020 FINDINGS: Lower chest: No acute abnormality. Hepatobiliary: Hepatomegaly, maximum coronal span 23.0 cm. Coarse, nodular contour of the liver. Very extensive, nearly confluent masslike and nodular signal abnormality throughout the liver (series 4, image 17). Although multiphasic contrast enhanced sequences are significantly limited by breath motion artifact, these lesions are mostly isoenhancing to liver parenchyma, however some are hypoenhancing, for example in the anterior right lobe of the liver (series 20, image 16) and some are faintly hyperenhancing, for example in the peripheral inferior right lobe of the liver (series 20, image 36). Given number and confluent character these are difficult to distinctly measure however a relatively solitary index lesion in the posterior right lobe of the liver, hepatic segment VI, measures 3.2 x 2.8 cm (series 3, image 24). No gallstones, gallbladder wall thickening, or biliary dilatation. Pancreas: Unremarkable. No pancreatic ductal dilatation or surrounding inflammatory changes. Spleen: Normal in size without significant abnormality. Adrenals/Urinary Tract: Adrenal glands are unremarkable. Simple, benign bilateral renal cortical cysts, for which no further follow-up or characterization is required. Kidneys are otherwise normal,  without obvious renal calculi, solid lesion, or hydronephrosis. Stomach/Bowel: Stomach is within normal limits. No evidence of bowel wall thickening, distention, or inflammatory changes. Vascular/Lymphatic: No significant vascular findings are present. No enlarged abdominal lymph nodes. Other: No abdominal wall hernia or abnormality. No ascites. Probable partially imaged left breast mass (series 4, image 1) Musculoskeletal: No acute osseous findings. Diffuse, heterogeneous marrow enhancement (series 19, image 53). IMPRESSION: 1. Hepatomegaly with very extensive, nearly confluent masslike and nodular signal abnormality throughout the liver, most consistent with extensive hepatic metastatic disease. 2. Diffuse, heterogeneous marrow enhancement, highly suspicious for diffuse osseous metastatic disease. 3. Probable partially imaged left breast mass. Constellation of findings is highly suggestive of diffusely metastatic breast malignancy. Consider additional imaging of the chest and correlation with mammography. Electronically Signed   By: Jearld Lesch M.D.   On: 12/18/2022 05:26   ECHOCARDIOGRAM COMPLETE  Result Date: 12/17/2022    ECHOCARDIOGRAM REPORT   Patient Name:   IDRIS KARPINSKI Date of Exam: 12/17/2022 Medical Rec #:  [id number]       Height:       72.0 in Accession #:    [id number]      Weight:       163.1 lb Date of Birth:  02-Mar-1956       BSA:          1.954 m Patient Age:    67 years        BP:           120/64 mmHg Patient Gender: F               HR:           106 bpm. Exam Location:  [facility] Procedure: 2D Echo, Cardiac Doppler and Color Doppler Indications:     Dyspnea R06.00  History:         Patient has prior history of Echocardiogram examinations, most                  recent 05/19/2020. Stroke; Risk Factors:Hypertension and                  Diabetes.  Sonographer:     [name] Referring Phys:  [id number] Marcelino Duster Diagnosing Phys: Debbe Odea MD  Sonographer Comments: Suboptimal apical  window. IMPRESSIONS  1. Left ventricular ejection fraction, by estimation, is 70 to 75%. The left ventricle has hyperdynamic function. The left ventricle has no regional wall motion abnormalities. There is mild left ventricular hypertrophy. Left ventricular diastolic parameters are consistent with Grade I diastolic dysfunction (impaired relaxation).  2. Right ventricular systolic function is normal. The right ventricular size is normal.  3. The mitral valve is normal in structure. No evidence of mitral valve regurgitation.  4. The aortic valve is grossly normal. Aortic valve regurgitation is not visualized.  5. The inferior vena cava is normal in size with greater than 50% respiratory variability, suggesting right atrial pressure of 3 mmHg. FINDINGS  Left Ventricle: Left ventricular ejection fraction, by estimation, is 70 to 75%. The left ventricle has hyperdynamic function. The left ventricle has no regional wall motion abnormalities. The left ventricular internal cavity size was normal in size. There is mild left ventricular hypertrophy. Left ventricular diastolic parameters are consistent with Grade I diastolic dysfunction (impaired relaxation). Right Ventricle: The right ventricular size is normal. No increase in right ventricular wall thickness. Right ventricular systolic function is normal. Left Atrium: Left atrial size was normal in size. Right Atrium: Right atrial size was normal in size. Pericardium: There is no evidence of pericardial effusion. Mitral Valve: The mitral valve is normal in structure. No evidence of mitral valve regurgitation. Tricuspid Valve: The tricuspid valve is normal in structure. Tricuspid valve regurgitation is not demonstrated. Aortic Valve: The aortic valve is grossly normal. Aortic valve regurgitation is not visualized. Aortic valve mean gradient measures 9.0 mmHg. Aortic valve peak gradient measures 16.0 mmHg. Aortic valve area, by VTI measures 3.68 cm. Pulmonic Valve: The  pulmonic valve was normal in structure. Pulmonic valve regurgitation is not visualized. Aorta: The aortic root is normal in size and structure. Venous: The inferior vena cava was not well visualized. The inferior vena cava is normal in size with greater than 50% respiratory variability, suggesting right atrial pressure of 3 mmHg. IAS/Shunts: No atrial level shunt detected by color flow Doppler.  LEFT VENTRICLE PLAX 2D LVIDd:         4.60 cm   Diastology LVIDs:         2.80 cm   LV e' medial:    9.57 cm/s LV PW:         1.10 cm   LV E/e' medial:  7.9 LV IVS:        1.20 cm   LV e' lateral:   5.98 cm/s LVOT diam:     2.10 cm   LV E/e' lateral: 12.7 LV SV:         93 LV SV Index:   48 LVOT Area:     3.46 cm  RIGHT VENTRICLE RV S prime:     22.70 cm/s TAPSE (M-mode): 2.6 cm LEFT ATRIUM             Index        RIGHT ATRIUM           Index LA diam:        3.90 cm 2.00 cm/m   RA Area:     16.20 cm LA Vol (A2C):   29.6 ml 15.15 ml/m  RA Volume:   42.50 ml  21.75 ml/m LA Vol (A4C):   45.1 ml 23.08 ml/m LA Biplane Vol: 39.5 ml 20.22 ml/m  AORTIC VALVE AV Area (Vmax):    3.41 cm AV Area (Vmean):   3.07 cm AV Area (VTI):  3.68 cm AV Vmax:           200.00 cm/s AV Vmean:          133.000 cm/s AV VTI:            0.253 m AV Peak Grad:      16.0 mmHg AV Mean Grad:      9.0 mmHg LVOT Vmax:         197.00 cm/s LVOT Vmean:        118.000 cm/s LVOT VTI:          0.269 m LVOT/AV VTI ratio: 1.06  AORTA Ao Root diam: 2.80 cm MITRAL VALVE                TRICUSPID VALVE MV Area (PHT): 6.32 cm     TR Peak grad:   15.8 mmHg MV Decel Time: 120 msec     TR Vmax:        199.00 cm/s MV E velocity: 76.00 cm/s MV A velocity: 159.00 cm/s  SHUNTS MV E/A ratio:  0.48         Systemic VTI:  0.27 m                             Systemic Diam: 2.10 cm Debbe Odea MD Electronically signed by Debbe Odea MD Signature Date/Time: 12/17/2022/2:23:47 PM    Final    DG Chest 1 View  Result Date: 12/17/2022 CLINICAL DATA:  Shortness of  breath. EXAM: CHEST  1 VIEW COMPARISON:  12/14/2022 FINDINGS: Single-view of the chest demonstrates slightly low lung volumes. No focal airspace disease or consolidation. No overt pulmonary edema. Heart and mediastinum are grossly stable. Patient appears to be mildly rotated on this examination. No acute bone abnormality. IMPRESSION: No active disease. Electronically Signed   By: Richarda Overlie M.D.   On: 12/17/2022 13:28   MR THORACIC SPINE W WO CONTRAST  Result Date: 12/17/2022 CLINICAL DATA:  Initial evaluation for right lower extremity weakness. EXAM: MRI THORACIC WITHOUT AND WITH CONTRAST TECHNIQUE: Multiplanar and multiecho pulse sequences of the thoracic spine were obtained without and with intravenous contrast. CONTRAST:  7mL GADAVIST GADOBUTROL 1 MMOL/ML IV SOLN COMPARISON:  None Available. FINDINGS: Alignment: Trace dextroscoliosis with mild straightening of the normal midthoracic kyphosis. No listhesis. Vertebrae: Vertebral body height maintained without acute or chronic fracture. Bone marrow signal intensity markedly heterogeneous. Few small STIR hyperintense lesions involving the teeth to through T4 vertebral bodies noted, favored to reflect atypical hemangiomata. No other worrisome osseous lesions. No other abnormal marrow edema or enhancement. Cord:  Normal signal and morphology.  No abnormal enhancement. Paraspinal and other soft tissues: Paraspinous soft tissues are within normal limits. Trace layering bilateral pleural effusions noted. Innumerable cystic lesions noted within the partially visualized liver, incompletely assessed on this exam. Disc levels: Ordinary for age multilevel disc desiccation seen throughout the thoracic spine. No significant disc bulge or focal disc herniation. Posterior element hypertrophy noted at T6-7. No significant spinal stenosis. Foramina remain patent. No neural impingement. IMPRESSION: 1. Normal MRI appearance of the thoracic spinal cord. No findings to explain  patient's symptoms identified. 2. Minor thoracic spondylosis for age. No significant stenosis or neural impingement. 3. Innumerable cystic lesions within the partially visualized liver, incompletely assessed on this exam. Again, further evaluation with dedicated MRI of the abdomen, with and without contrast, recommended for further evaluation. Electronically Signed   By: Rise Mu M.D.   On:  12/17/2022 04:40   MR CERVICAL SPINE W WO CONTRAST  Result Date: 12/16/2022 CLINICAL DATA:  Initial evaluation for weakness, hyper reflexia. EXAM: MRI CERVICAL SPINE WITHOUT AND WITH CONTRAST TECHNIQUE: Multiplanar and multiecho pulse sequences of the cervical spine, to include the craniocervical junction and cervicothoracic junction, were obtained without and with intravenous contrast. CONTRAST:  7mL GADAVIST GADOBUTROL 1 MMOL/ML IV SOLN COMPARISON:  None Available. FINDINGS: Alignment: Examination mildly degraded by motion. Straightening of the normal cervical lordosis. No significant listhesis. Vertebrae: Vertebral body height maintained without acute or chronic fracture. Bone marrow signal intensity diffusely heterogeneous. Subcentimeter stir hyperintense lesions involving the T2 and T3 vertebral bodies noted, favored to reflect small atypical hemangiomata noted. No worrisome osseous lesions. No other abnormal marrow edema or enhancement. Cord: Normal signal and morphology. No convincing cord signal changes seen on this motion degraded exam. No abnormal enhancement. Posterior Fossa, vertebral arteries, paraspinal tissues: Patient's known cavernous ICA aneurysm noted, partially visualized. Craniocervical junction within normal limits. Paraspinous soft tissues within normal limits. Normal flow voids seen within the vertebral arteries bilaterally. Disc levels: C2-C3: Mild disc bulge. No spinal stenosis. Foramina remain patent. C3-C4: Mild disc bulge with uncovertebral spurring. Flattening and partial effacement of  the ventral thecal sac with resultant mild spinal stenosis. Mild left C4 foraminal narrowing. Right neural foramen remains patent. C4-C5: Mild diffuse disc bulge with uncovertebral spurring. Flattening and partial effacement of the ventral thecal sac. Mild cord flattening without cord signal changes. Mild spinal stenosis. Foramina remain patent. C5-C6: Mild disc bulge with uncovertebral spurring. Flattening and partial effacement of the ventral thecal sac with resultant mild spinal stenosis. Foramina remain patent. C6-C7: Degenerative intervertebral disc space narrowing with diffuse disc osteophyte complex. Posterior component flattens and partially effaces the ventral thecal sac. No more than mild spinal stenosis. Mild left C7 foraminal narrowing. Right neural foramen remains patent. C7-T1: Mild degenerative disc space narrowing with diffuse disc bulge and uncovertebral spurring. Mild bilateral facet hypertrophy. Resultant mild spinal stenosis. Mild left C8 foraminal narrowing. Right neural foramen remains patent. IMPRESSION: 1. Normal MRI appearance of the cervical spinal cord. No cord signal changes to suggest myelopathy or other abnormality. 2. Multilevel cervical spondylosis with resultant mild diffuse spinal stenosis at C3-4 through C7-T1. 3. Mild left C4, C7, and C8 foraminal stenosis related to disc bulge and uncovertebral disease. 4. 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 Electronically Signed   By: Rise Mu M.D.   On: 12/16/2022 06:21   MR LUMBAR SPINE WO CONTRAST  Result Date: 12/14/2022 CLINICAL DATA:  Initial evaluation for acute myelopathy. EXAM: MRI LUMBAR SPINE WITHOUT CONTRAST TECHNIQUE: Multiplanar, multisequence MR imaging of the lumbar spine was performed. No intravenous contrast was administered. COMPARISON:  None Available. FINDINGS: Segmentation:  Standard. Alignment:  Physiologic. Vertebrae: Vertebral body height maintained without  acute or chronic fracture. Bone marrow signal intensity diffusely heterogeneous. No worrisome osseous lesions or abnormal marrow edema. Conus medullaris and cauda equina: Conus extends to the T12 level. Conus and cauda equina appear normal. Paraspinal and other soft tissues: Paraspinous soft tissues within normal limits. Few scattered T2 hyperintense cyst noted about the kidneys, largest of which measures 2 cm on the left. These are benign in appearance, with no follow-up imaging recommended. Innumerable cystic lesions noted within the partially visualized liver. Disc levels: L1-2: Disc desiccation with minimal annular bulge. Mild facet hypertrophy. No stenosis. L2-3: Degenerative intervertebral disc space narrowing with circumferential disc bulge and disc desiccation. Mild reactive  endplate spurring. Mild bilateral facet hypertrophy. No significant spinal stenosis. Foramina remain patent. L3-4: Disc desiccation with mild diffuse disc bulge. Mild reactive endplate spurring. Mild facet and ligament flavum hypertrophy. Resultant mild-to-moderate spinal stenosis. Mild bilateral L3 foraminal narrowing. L4-5: Disc desiccation with diffuse disc bulge. Superimposed small right subarticular disc protrusion with slight inferior migration and annular fissure. Protruding disc contacts the descending right L5 nerve root in the right lateral recess (series 12, image 31). Moderate bilateral facet arthrosis. Resultant moderate canal with bilateral subarticular stenosis. Mild bilateral L4 foraminal narrowing. L5-S1:  Negative interspace.  Mild facet hypertrophy.  No stenosis. IMPRESSION: 1. Disc bulge with small right subarticular disc protrusion at L4-5, potentially affecting the descending right L5 nerve root. Moderate canal with mild bilateral L4 foraminal stenosis at this level. 2. Disc bulge with facet hypertrophy at L3-4 with resultant mild to moderate spinal stenosis, with mild bilateral L3 foraminal narrowing. 3.  Innumerable cystic lesions within the visualized liver. While these could reflect benign cysts, these are incompletely assessed on this exam. Correlation with dedicated abdominal MRI recommended for further evaluation. Electronically Signed   By: Rise Mu M.D.   On: 12/14/2022 22:30   US ABDOMEN LIMITED RUQ (LIVER/GB)  Result Date: 12/14/2022 CLINICAL DATA:  Right upper quadrant pain and elevated liver function test. EXAM: ULTRASOUND ABDOMEN LIMITED RIGHT UPPER QUADRANT COMPARISON:  None Available. FINDINGS: Gallbladder: Gallbladder is partially contracted. No gallstones or wall thickening visualized (3.3 mm). No sonographic Murphy sign noted by sonographer. Common bile duct: Diameter: 1.6 mm Liver: A 1.2 cm x 1.0 cm x 1.2 cm hypoechoic area is seen within the right lobe of the liver. The hepatic parenchyma is nodular in contour and coarse in echogenicity. Portal vein is patent on color Doppler imaging with normal direction of blood flow towards the liver. Other: None. IMPRESSION: 1. Hepatic cirrhosis. 2. 1.2 cm x 1.0 cm x 1.2 cm hypoechoic liver lesion, as described above. MRI correlation is recommended to further exclude the presence of an underlying neoplastic process. Electronically Signed   By: Aram Candela M.D.   On: 12/14/2022 18:10   MR BRAIN WO CONTRAST  Result Date: 12/14/2022 CLINICAL DATA:  Neuro deficit, acute, stroke suspected. Weakness over the last 3 weeks EXAM: MRI HEAD WITHOUT CONTRAST TECHNIQUE: Multiplanar, multiecho pulse sequences of the brain and surrounding structures were obtained without intravenous contrast. COMPARISON:  Head CT same day.  MRI 05/18/2020. FINDINGS: Brain: Diffusion imaging does not show any acute or subacute infarction. Mild chronic small-vessel ischemic change affects pons. 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 Cerebral hemispheres show moderate to marked chronic small-vessel ischemic changes of the white matter,  similar to the prior exam. No large vessel territory stroke. No mass lesion, hemorrhage, hydrocephalus or extra-axial collection. 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 Skull and upper cervical spine: Negative Sinuses/Orbits: Clear/normal Other: None IMPRESSION: 1. No acute finding. Moderate to marked chronic small-vessel ischemic changes of the pons and cerebral hemispheric white matter, similar to the study of 05/18/2020. 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 2. 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 Electronically Signed   By: Paulina Fusi M.D.   On: 12/14/2022 13:53   CT Head Wo Contrast  Result Date: 12/14/2022 CLINICAL DATA:  Neuro deficit, acute, stroke suspected. Weakness with inability to raise the right leg. Recent falls. EXAM: CT HEAD WITHOUT CONTRAST TECHNIQUE:  Contiguous axial images were obtained from the base of the skull through the vertex without intravenous contrast. RADIATION DOSE REDUCTION: This exam was performed according to the departmental dose-optimization program which includes automated exposure control, adjustment of the mA and/or kV according to patient size and/or use of iterative reconstruction technique. COMPARISON:  Head and neck CTA 05/19/2020 FINDINGS: Brain: There is no evidence of an acute infarct, intracranial hemorrhage, mass, midline shift, or extra-axial fluid collection. Mild cerebral atrophy is within normal limits for age. Patchy hypodensities in the cerebral white matter are similar to the prior study and are nonspecific but compatible with moderate chronic small vessel ischemic disease. Vascular: Calcified atherosclerosis at the skull base. Known 1.4 cm peripherally calcified right cavernous ICA aneurysm, similar in size to the prior CTA. Skull: No acute fracture or suspicious osseous lesion. Sinuses/Orbits: Visualized paranasal  sinuses and mastoid air cells are clear. Unremarkable orbits. Other: None. IMPRESSION: 1. No evidence of acute intracranial abnormality. 2. Moderate chronic small vessel ischemic disease. 3. Known right cavernous ICA aneurysm. Electronically Signed   By: Sebastian Ache M.D.   On: 12/14/2022 11:51   DG Chest Port 1 View  Result Date: 12/14/2022 CLINICAL DATA:  Weakness. EXAM: PORTABLE CHEST 1 VIEW COMPARISON:  Chest radiograph 06/01/2020 and CT 06/09/2020 FINDINGS: The cardiomediastinal silhouette is unchanged with normal heart size. Minor bilateral lung scarring is noted. No acute airspace consolidation, edema, sizable pleural effusion, or pneumothorax is identified. No acute osseous abnormality is seen. IMPRESSION: No active disease. Electronically Signed   By: Sebastian Ache M.D.   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  # Recommend CDK- inhibitor + AI [Ribociclib+Letrozole].  I had a long discussion regarding the mechanism of action of endocrine therapy.  Discussed that unfortunately her cancer is incurable; staging 4.  Discussed the potential side effects including but not limited to hot flashes arthralgias; and also discussed regarding nausea vomiting diarrhea skin rash; low blood counts; and also EKG abnormalities.  Discussed with pharmacy.  # Hx of Stroke-;chronic Right LE  weakness- currently in rehab.  Concerns of bladder bowel incontinence-AUG 2024-MRI without contrast cervical thoracic lumbar spine-no lesions involving the spine.  Await PET scan-consider contrast MRI and possible-evaluation with radiation oncology.   # Mild hypercalcemia calcium 10.3-again recheck at next visit; suspect from bony disease.  Consider Zometa next visit.   # Elevated alkaline phosphatase-likely secondary to bone mets.  Await PET  scan; consider Zometa as above.   # DISPOSITION: # PET scan ASAP # Follow up in 2-3 weeks-MD: labs- cbc/cmp- Dr.B  # 40 minutes face-to-face with the patient discussing the above plan of care; more than 50% of time spent on prognosis/ natural history; counseling and coordination.  Discussed with patient's family-Auntie; and cousin [place] and [place].  Above plan of care was discussed with patient/family in detail.  My contact information was given to the patient/family.     Earna Coder, MD 12/28/2022 11:49 AM

## 2022-12-27 NOTE — Assessment & Plan Note (Signed)
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  # recommend CDK- inhibitor + AI.   # Hx of Stroke   # DISPOSITION: # PET scan ASAP

## 2022-12-28 ENCOUNTER — Telehealth: Payer: Self-pay | Admitting: Internal Medicine

## 2022-12-28 ENCOUNTER — Encounter: Payer: Self-pay | Admitting: Internal Medicine

## 2022-12-28 ENCOUNTER — Inpatient Hospital Stay: Payer: 59

## 2022-12-28 ENCOUNTER — Other Ambulatory Visit (HOSPITAL_COMMUNITY): Payer: Self-pay

## 2022-12-28 ENCOUNTER — Telehealth: Payer: Self-pay

## 2022-12-28 ENCOUNTER — Inpatient Hospital Stay: Payer: 59 | Attending: Internal Medicine | Admitting: Internal Medicine

## 2022-12-28 ENCOUNTER — Encounter: Payer: Self-pay | Admitting: *Deleted

## 2022-12-28 VITALS — BP 114/78 | HR 115 | Temp 97.6°F | Ht 72.0 in | Wt 188.0 lb

## 2022-12-28 DIAGNOSIS — C50812 Malignant neoplasm of overlapping sites of left female breast: Secondary | ICD-10-CM | POA: Diagnosis present

## 2022-12-28 DIAGNOSIS — Z170 Estrogen receptor positive status [ER+]: Secondary | ICD-10-CM | POA: Diagnosis present

## 2022-12-28 DIAGNOSIS — R748 Abnormal levels of other serum enzymes: Secondary | ICD-10-CM | POA: Insufficient documentation

## 2022-12-28 DIAGNOSIS — R1310 Dysphagia, unspecified: Secondary | ICD-10-CM | POA: Diagnosis not present

## 2022-12-28 DIAGNOSIS — R262 Difficulty in walking, not elsewhere classified: Secondary | ICD-10-CM | POA: Diagnosis not present

## 2022-12-28 DIAGNOSIS — C7951 Secondary malignant neoplasm of bone: Secondary | ICD-10-CM | POA: Insufficient documentation

## 2022-12-28 DIAGNOSIS — Z803 Family history of malignant neoplasm of breast: Secondary | ICD-10-CM | POA: Insufficient documentation

## 2022-12-28 DIAGNOSIS — R5383 Other fatigue: Secondary | ICD-10-CM | POA: Insufficient documentation

## 2022-12-28 DIAGNOSIS — Z87891 Personal history of nicotine dependence: Secondary | ICD-10-CM | POA: Insufficient documentation

## 2022-12-28 DIAGNOSIS — Z8673 Personal history of transient ischemic attack (TIA), and cerebral infarction without residual deficits: Secondary | ICD-10-CM | POA: Diagnosis not present

## 2022-12-28 DIAGNOSIS — R160 Hepatomegaly, not elsewhere classified: Secondary | ICD-10-CM

## 2022-12-28 LAB — CBC WITH DIFFERENTIAL (CANCER CENTER ONLY)
Abs Immature Granulocytes: 0.07 10*3/uL (ref 0.00–0.07)
Basophils Absolute: 0.1 10*3/uL (ref 0.0–0.1)
Basophils Relative: 1 %
Eosinophils Absolute: 0.1 10*3/uL (ref 0.0–0.5)
Eosinophils Relative: 1 %
HCT: 33.7 % — ABNORMAL LOW (ref 36.0–46.0)
Hemoglobin: 11.1 g/dL — ABNORMAL LOW (ref 12.0–15.0)
Immature Granulocytes: 1 %
Lymphocytes Relative: 16 %
Lymphs Abs: 2 10*3/uL (ref 0.7–4.0)
MCH: 26.7 pg (ref 26.0–34.0)
MCHC: 32.9 g/dL (ref 30.0–36.0)
MCV: 81.2 fL (ref 80.0–100.0)
Monocytes Absolute: 1.4 10*3/uL — ABNORMAL HIGH (ref 0.1–1.0)
Monocytes Relative: 12 %
Neutro Abs: 8.7 10*3/uL — ABNORMAL HIGH (ref 1.7–7.7)
Neutrophils Relative %: 69 %
Platelet Count: 369 10*3/uL (ref 150–400)
RBC: 4.15 MIL/uL (ref 3.87–5.11)
RDW: 17 % — ABNORMAL HIGH (ref 11.5–15.5)
WBC Count: 12.4 10*3/uL — ABNORMAL HIGH (ref 4.0–10.5)
nRBC: 0.2 % (ref 0.0–0.2)

## 2022-12-28 LAB — CMP (CANCER CENTER ONLY)
ALT: 50 U/L — ABNORMAL HIGH (ref 0–44)
AST: 133 U/L — ABNORMAL HIGH (ref 15–41)
Albumin: 3.2 g/dL — ABNORMAL LOW (ref 3.5–5.0)
Alkaline Phosphatase: 418 U/L — ABNORMAL HIGH (ref 38–126)
Anion gap: 9 (ref 5–15)
BUN: 21 mg/dL (ref 8–23)
CO2: 22 mmol/L (ref 22–32)
Calcium: 10.4 mg/dL — ABNORMAL HIGH (ref 8.9–10.3)
Chloride: 105 mmol/L (ref 98–111)
Creatinine: 1.25 mg/dL — ABNORMAL HIGH (ref 0.44–1.00)
GFR, Estimated: 48 mL/min — ABNORMAL LOW (ref >60)
Glucose, Bld: 106 mg/dL — ABNORMAL HIGH (ref 70–99)
Potassium: 3.4 mmol/L — ABNORMAL LOW (ref 3.5–5.1)
Sodium: 136 mmol/L (ref 135–145)
Total Bilirubin: 1.4 mg/dL — ABNORMAL HIGH (ref 0.3–1.2)
Total Protein: 7.5 g/dL (ref 6.5–8.1)

## 2022-12-28 MED ORDER — LETROZOLE 2.5 MG PO TABS: 2.5000 mg | ORAL_TABLET | Freq: Every day | ORAL | 3 refills | Status: DC

## 2022-12-28 NOTE — Progress Notes (Signed)
Met with patient and family during hospital follow up visit with Dr. Leonard Schwartz.

## 2022-12-28 NOTE — Telephone Encounter (Signed)
  Per chat-  please add zometa infusion at next visit- also add vit D levels   Appointment added- [facility]

## 2022-12-28 NOTE — Telephone Encounter (Signed)
Oral Oncology Patient Advocate Encounter  Prior Authorization for Idelle Jo has been approved.    PA#  [id number]  Effective dates: 12/28/22 through 05/17/23  Patients co-pay is $0.00.    Ardeen Fillers, CPhT Oncology Pharmacy Patient Advocate  [facility]  [phone] (phone) [phone] (fax) 12/28/2022 11:26 AM

## 2022-12-28 NOTE — Telephone Encounter (Signed)
Oral Oncology Patient Advocate Encounter  New authorization   Received notification that prior authorization for Stephanie [name] is required.   PA submitted on 12/28/22  Key [id number]  Status is pending     Ardeen Fillers, CPhT Oncology Pharmacy Patient Advocate  [facility]  [phone] (phone) [phone] (fax) 12/28/2022 11:12 AM

## 2022-12-28 NOTE — Progress Notes (Signed)
Appetite is at 50%. Doesn't drink any supplement drinks.  C/o rt foot falling asleep at times.

## 2022-12-28 NOTE — Addendum Note (Signed)
Addended by: Clydia Llano on: 12/28/2022 11:52 AM   Modules accepted: Orders

## 2022-12-29 ENCOUNTER — Telehealth: Payer: Self-pay

## 2022-12-29 ENCOUNTER — Other Ambulatory Visit: Payer: Self-pay

## 2022-12-29 ENCOUNTER — Other Ambulatory Visit (HOSPITAL_COMMUNITY): Payer: Self-pay

## 2022-12-29 DIAGNOSIS — C50812 Malignant neoplasm of overlapping sites of left female breast: Secondary | ICD-10-CM

## 2022-12-29 MED ORDER — RIBOCICLIB SUCC (600 MG DOSE) 200 MG PO TBPK
600.0000 mg | ORAL_TABLET | Freq: Every day | ORAL | 0 refills | Status: DC
Start: 2022-12-29 — End: 2022-12-29

## 2022-12-29 MED ORDER — RIBOCICLIB SUCC (600 MG DOSE) 200 MG PO TBPK: 600.0000 mg | ORAL_TABLET | Freq: Every day | ORAL | 0 refills | Status: DC

## 2022-12-29 MED ORDER — RIBOCICLIB SUCC (600 MG DOSE) 200 MG PO TBPK
600.0000 mg | ORAL_TABLET | Freq: Every day | ORAL | 0 refills | Status: DC
Start: 2022-12-29 — End: 2023-01-17
  Filled 2022-12-29: qty 63, 21d supply, fill #0
  Filled 2022-12-29: qty 63, 28d supply, fill #0

## 2022-12-29 NOTE — Telephone Encounter (Addendum)
Oral Chemotherapy Pharmacist Encounter   Received new prescription for Kisqali (ribociclib) for the treatment of metastatic ER/PR+ HER2- breast cancer in conjunction with letrozole, planned duration until disease progression or unacceptable drug toxicity.  Labs from 8/13 assessed, no baseline dose adjustments required for initial dosing regimen. Patient with baseline elevated LFTs/tbili secondary to liver lesions/cirrhosis will require further monitoring while on therapy. Prescription dose and frequency assessed.  Ribociblib requires QTc monitoring due to increased risk of prolonged QT interval. Baseline EKG from 7/30 shows QTc 433. Patient will need further QTc monitoring on C1D14, C2D1, and then as clinically indicated.    Current medication list in Epic reviewed, DDI with ribociclib identified: Amlodipine: Moderate interaction. CYP3A4 inhibition may increase serum concentration of amlodipine. Monitor for increased amlodipine effects (i.e. hypotension, edema)    Evaluated chart and no patient barriers to medication adherence identified.   Prescription has been e-scribed to the [facility] for benefits analysis and approval.  Oral Oncology Clinic will continue to follow for insurance authorization, copayment issues, initial counseling and start date.    Jerry Caras, PharmD PGY2 Oncology Pharmacy Resident   12/29/2022 12:59 PM

## 2022-12-29 NOTE — Telephone Encounter (Signed)
Oral Chemotherapy Pharmacist Encounter   Spoke with nurse Marylene Land at [facility]. She provided fax number of [phone]. Order for administration faxed to facility on 12/29/22 to the attention on Angela. They know patient's family will bring in medication. Noted on order, patient to start med on 01/03/23.  Provided my phone number to Antony Madura further questions arise.    Remi Haggard, PharmD, BCPS, BCOP, CPP Hematology/Oncology Clinical Pharmacist /[facility] [phone]  12/29/2022 4:13 PM

## 2022-12-29 NOTE — Telephone Encounter (Signed)
Oral Chemotherapy Pharmacist Encounter   Patient Education I spoke with Katrina, patient's cousin/family caregiver, for overview of new oral chemotherapy medication: Kisquali (ribociclib) for the treatment of metastatic breast cancer, planned duration until disease progression or unacceptable drug toxicity. She reports that she, along with several other family members, will severe as the patient's caregivers. Confirmed that patient will start therapy on 01/03/23.   Counseled patient on administration, dosing, side effects, monitoring, drug-food interactions, safe handling, storage, and disposal. Patient will take 3 tablets (600 mg) by mouth once daily.  Side effects include but not limited to: myelosuppression, nausea, vomiting, diarrhea, hepatotoxicity, alopecia.   Myelosuppression  Discussed how ribociclib can cause myelosuppression increasing the risk for infection. Patient caregiver is aware that we will monitor this through routine labs during patient visits.  She verbalizes understanding that to call the office if patient becomes febrile with a temparature > 100.4 F.   QT Prolongation  Discussed the risk of QT prolongation in patients taking ribociclib. She is aware that patient will have to obtain EKG monitoring at next scheduled appointment visit.  Diarrhea Discussed how diarrhea is a common adverse effect that can occur during treatment with ribociclib.  Educated patient caregiver on the use of Immodium to take as needed for the management of diarrhea. She is aware she can obtain OTC. She is aware that she will need to alert the office if patient experiences 4 or more loose stools above baseline.  Nausea/Vomiting  Discussed the occurrence of nausea/vomiting as an adverse effect.  She is aware to contact office to obtain anti-nausea medication if patient experiences any symptoms    Reviewed with patient importance of keeping a medication schedule and plan for any missed  doses.  After discussion with Katrina no patient barriers to medication adherence identified.   She voiced understanding and appreciation. All questions answered. Medication handout provided.  Provided patient with Oral Chemotherapy Navigation Clinic phone number. Patient knows to call the office with questions or concerns. Oral Chemotherapy Navigation Clinic will continue to follow.   Jerry Caras, PharmD PGY2 Oncology Pharmacy Resident   12/29/2022 4:02 PM

## 2022-12-29 NOTE — Telephone Encounter (Signed)
Patient successfully OnBoarded and drug education provided by pharmacist. Medication scheduled to be shipped on 12/30/22 for delivery on 12/31/22 from [facility] to patient's address. Patient also knows to call me at [phone] with any questions or concerns regarding receiving medication or if there is any unexpected change in co-pay.    Ardeen Fillers, CPhT Oncology Pharmacy Patient Advocate  [facility]  [phone] (phone) [phone] (fax) 12/29/2022 4:19 PM

## 2023-01-03 ENCOUNTER — Other Ambulatory Visit (HOSPITAL_COMMUNITY): Payer: Self-pay

## 2023-01-04 ENCOUNTER — Ambulatory Visit
Admission: RE | Admit: 2023-01-04 | Discharge: 2023-01-04 | Disposition: A | Discharge status: Home / Self Care | Payer: 59 | Source: Ambulatory Visit | Attending: Internal Medicine | Admitting: Internal Medicine

## 2023-01-04 ENCOUNTER — Encounter: Payer: Self-pay | Admitting: *Deleted

## 2023-01-04 DIAGNOSIS — Z170 Estrogen receptor positive status [ER+]: Secondary | ICD-10-CM

## 2023-01-05 ENCOUNTER — Ambulatory Visit
Admission: RE | Admit: 2023-01-05 | Discharge: 2023-01-05 | Disposition: A | Discharge status: Home / Self Care | Payer: 59 | Source: Ambulatory Visit | Attending: Internal Medicine | Admitting: Internal Medicine

## 2023-01-05 DIAGNOSIS — Z170 Estrogen receptor positive status [ER+]: Secondary | ICD-10-CM | POA: Diagnosis not present

## 2023-01-05 DIAGNOSIS — C787 Secondary malignant neoplasm of liver and intrahepatic bile duct: Secondary | ICD-10-CM | POA: Insufficient documentation

## 2023-01-05 DIAGNOSIS — J439 Emphysema, unspecified: Secondary | ICD-10-CM | POA: Insufficient documentation

## 2023-01-05 DIAGNOSIS — C50812 Malignant neoplasm of overlapping sites of left female breast: Secondary | ICD-10-CM | POA: Insufficient documentation

## 2023-01-05 DIAGNOSIS — C7951 Secondary malignant neoplasm of bone: Secondary | ICD-10-CM | POA: Diagnosis not present

## 2023-01-05 DIAGNOSIS — I700 Atherosclerosis of aorta: Secondary | ICD-10-CM | POA: Insufficient documentation

## 2023-01-05 LAB — GLUCOSE, CAPILLARY: Glucose-Capillary: 83 mg/dL (ref 70–99)

## 2023-01-05 MED ORDER — FLUDEOXYGLUCOSE F - 18 (FDG) INJECTION
9.7000 | Freq: Once | INTRAVENOUS | Status: AC | PRN
  Administered 2023-01-05: 10.22 via INTRAVENOUS

## 2023-01-12 ENCOUNTER — Inpatient Hospital Stay: Payer: 59

## 2023-01-12 ENCOUNTER — Emergency Department: Payer: 59

## 2023-01-12 ENCOUNTER — Encounter: Payer: Self-pay | Admitting: Internal Medicine

## 2023-01-12 ENCOUNTER — Inpatient Hospital Stay (HOSPITAL_BASED_OUTPATIENT_CLINIC_OR_DEPARTMENT_OTHER): Payer: 59 | Admitting: Internal Medicine

## 2023-01-12 ENCOUNTER — Other Ambulatory Visit: Payer: Self-pay

## 2023-01-12 ENCOUNTER — Inpatient Hospital Stay
Admission: EM | Admit: 2023-01-12 | Discharge: 2023-01-17 | DRG: 682 | Disposition: A | Discharge status: Skilled Nursing Facility | Payer: 59 | Attending: Hospitalist | Admitting: Hospitalist

## 2023-01-12 DIAGNOSIS — E440 Moderate protein-calorie malnutrition: Secondary | ICD-10-CM | POA: Diagnosis present

## 2023-01-12 DIAGNOSIS — U071 COVID-19: Secondary | ICD-10-CM | POA: Diagnosis not present

## 2023-01-12 DIAGNOSIS — E860 Dehydration: Secondary | ICD-10-CM | POA: Diagnosis present

## 2023-01-12 DIAGNOSIS — Z8249 Family history of ischemic heart disease and other diseases of the circulatory system: Secondary | ICD-10-CM

## 2023-01-12 DIAGNOSIS — Z8673 Personal history of transient ischemic attack (TIA), and cerebral infarction without residual deficits: Secondary | ICD-10-CM

## 2023-01-12 DIAGNOSIS — Z6824 Body mass index (BMI) 24.0-24.9, adult: Secondary | ICD-10-CM

## 2023-01-12 DIAGNOSIS — Z170 Estrogen receptor positive status [ER+]: Secondary | ICD-10-CM

## 2023-01-12 DIAGNOSIS — C7951 Secondary malignant neoplasm of bone: Secondary | ICD-10-CM | POA: Diagnosis present

## 2023-01-12 DIAGNOSIS — N179 Acute kidney failure, unspecified: Principal | ICD-10-CM | POA: Diagnosis present

## 2023-01-12 DIAGNOSIS — C50812 Malignant neoplasm of overlapping sites of left female breast: Secondary | ICD-10-CM

## 2023-01-12 DIAGNOSIS — Z66 Do not resuscitate: Secondary | ICD-10-CM | POA: Diagnosis present

## 2023-01-12 DIAGNOSIS — R7989 Other specified abnormal findings of blood chemistry: Secondary | ICD-10-CM | POA: Diagnosis present

## 2023-01-12 DIAGNOSIS — T451X5A Adverse effect of antineoplastic and immunosuppressive drugs, initial encounter: Secondary | ICD-10-CM | POA: Diagnosis present

## 2023-01-12 DIAGNOSIS — E876 Hypokalemia: Secondary | ICD-10-CM | POA: Diagnosis present

## 2023-01-12 DIAGNOSIS — Z803 Family history of malignant neoplasm of breast: Secondary | ICD-10-CM

## 2023-01-12 DIAGNOSIS — D630 Anemia in neoplastic disease: Secondary | ICD-10-CM | POA: Diagnosis present

## 2023-01-12 DIAGNOSIS — R112 Nausea with vomiting, unspecified: Secondary | ICD-10-CM | POA: Diagnosis not present

## 2023-01-12 DIAGNOSIS — R531 Weakness: Secondary | ICD-10-CM

## 2023-01-12 DIAGNOSIS — I129 Hypertensive chronic kidney disease with stage 1 through stage 4 chronic kidney disease, or unspecified chronic kidney disease: Secondary | ICD-10-CM | POA: Diagnosis present

## 2023-01-12 DIAGNOSIS — J441 Chronic obstructive pulmonary disease with (acute) exacerbation: Secondary | ICD-10-CM | POA: Diagnosis present

## 2023-01-12 DIAGNOSIS — Z885 Allergy status to narcotic agent status: Secondary | ICD-10-CM

## 2023-01-12 DIAGNOSIS — Z79899 Other long term (current) drug therapy: Secondary | ICD-10-CM

## 2023-01-12 DIAGNOSIS — I10 Essential (primary) hypertension: Secondary | ICD-10-CM | POA: Diagnosis present

## 2023-01-12 DIAGNOSIS — N1831 Chronic kidney disease, stage 3a: Secondary | ICD-10-CM | POA: Diagnosis present

## 2023-01-12 DIAGNOSIS — Z825 Family history of asthma and other chronic lower respiratory diseases: Secondary | ICD-10-CM

## 2023-01-12 DIAGNOSIS — C7981 Secondary malignant neoplasm of breast: Secondary | ICD-10-CM

## 2023-01-12 DIAGNOSIS — Z888 Allergy status to other drugs, medicaments and biological substances status: Secondary | ICD-10-CM

## 2023-01-12 DIAGNOSIS — Z823 Family history of stroke: Secondary | ICD-10-CM

## 2023-01-12 DIAGNOSIS — Z90710 Acquired absence of both cervix and uterus: Secondary | ICD-10-CM

## 2023-01-12 DIAGNOSIS — C787 Secondary malignant neoplasm of liver and intrahepatic bile duct: Secondary | ICD-10-CM | POA: Diagnosis present

## 2023-01-12 DIAGNOSIS — Z87891 Personal history of nicotine dependence: Secondary | ICD-10-CM

## 2023-01-12 DIAGNOSIS — E1122 Type 2 diabetes mellitus with diabetic chronic kidney disease: Secondary | ICD-10-CM | POA: Diagnosis present

## 2023-01-12 LAB — CMP (CANCER CENTER ONLY)
ALT: 32 U/L (ref 0–44)
AST: 85 U/L — ABNORMAL HIGH (ref 15–41)
Albumin: 3.1 g/dL — ABNORMAL LOW (ref 3.5–5.0)
Alkaline Phosphatase: 416 U/L — ABNORMAL HIGH (ref 38–126)
Anion gap: 12 (ref 5–15)
BUN: 30 mg/dL — ABNORMAL HIGH (ref 8–23)
CO2: 24 mmol/L (ref 22–32)
Calcium: 10.9 mg/dL — ABNORMAL HIGH (ref 8.9–10.3)
Chloride: 97 mmol/L — ABNORMAL LOW (ref 98–111)
Creatinine: 1.96 mg/dL — ABNORMAL HIGH (ref 0.44–1.00)
GFR, Estimated: 28 mL/min — ABNORMAL LOW (ref >60)
Glucose, Bld: 113 mg/dL — ABNORMAL HIGH (ref 70–99)
Potassium: 4 mmol/L (ref 3.5–5.1)
Sodium: 133 mmol/L — ABNORMAL LOW (ref 135–145)
Total Bilirubin: 2.8 mg/dL — ABNORMAL HIGH (ref 0.3–1.2)
Total Protein: 8.1 g/dL (ref 6.5–8.1)

## 2023-01-12 LAB — CBC WITH DIFFERENTIAL (CANCER CENTER ONLY)
Abs Immature Granulocytes: 0.08 10*3/uL — ABNORMAL HIGH (ref 0.00–0.07)
Basophils Absolute: 0.1 10*3/uL (ref 0.0–0.1)
Basophils Relative: 1 %
Eosinophils Absolute: 0 10*3/uL (ref 0.0–0.5)
Eosinophils Relative: 0 %
HCT: 29.1 % — ABNORMAL LOW (ref 36.0–46.0)
Hemoglobin: 9.8 g/dL — ABNORMAL LOW (ref 12.0–15.0)
Immature Granulocytes: 1 %
Lymphocytes Relative: 11 %
Lymphs Abs: 0.8 10*3/uL (ref 0.7–4.0)
MCH: 27.7 pg (ref 26.0–34.0)
MCHC: 33.7 g/dL (ref 30.0–36.0)
MCV: 82.2 fL (ref 80.0–100.0)
Monocytes Absolute: 0.3 10*3/uL (ref 0.1–1.0)
Monocytes Relative: 4 %
Neutro Abs: 5.9 10*3/uL (ref 1.7–7.7)
Neutrophils Relative %: 83 %
Platelet Count: 447 10*3/uL — ABNORMAL HIGH (ref 150–400)
RBC: 3.54 MIL/uL — ABNORMAL LOW (ref 3.87–5.11)
RDW: 18.6 % — ABNORMAL HIGH (ref 11.5–15.5)
Smear Review: NORMAL
WBC Count: 7.1 10*3/uL (ref 4.0–10.5)
nRBC: 0 % (ref 0.0–0.2)

## 2023-01-12 LAB — CBC
HCT: 27.9 % — ABNORMAL LOW (ref 36.0–46.0)
Hemoglobin: 9.2 g/dL — ABNORMAL LOW (ref 12.0–15.0)
MCH: 27.4 pg (ref 26.0–34.0)
MCHC: 33 g/dL (ref 30.0–36.0)
MCV: 83 fL (ref 80.0–100.0)
Platelets: 400 10*3/uL (ref 150–400)
RBC: 3.36 MIL/uL — ABNORMAL LOW (ref 3.87–5.11)
RDW: 18.4 % — ABNORMAL HIGH (ref 11.5–15.5)
WBC: 7.1 10*3/uL (ref 4.0–10.5)
nRBC: 0 % (ref 0.0–0.2)

## 2023-01-12 LAB — BASIC METABOLIC PANEL
Anion gap: 14 (ref 5–15)
BUN: 30 mg/dL — ABNORMAL HIGH (ref 8–23)
CO2: 22 mmol/L (ref 22–32)
Calcium: 10.5 mg/dL — ABNORMAL HIGH (ref 8.9–10.3)
Chloride: 97 mmol/L — ABNORMAL LOW (ref 98–111)
Creatinine, Ser: 2.1 mg/dL — ABNORMAL HIGH (ref 0.44–1.00)
GFR, Estimated: 26 mL/min — ABNORMAL LOW (ref >60)
Glucose, Bld: 112 mg/dL — ABNORMAL HIGH (ref 70–99)
Potassium: 4 mmol/L (ref 3.5–5.1)
Sodium: 133 mmol/L — ABNORMAL LOW (ref 135–145)

## 2023-01-12 LAB — VITAMIN D 25 HYDROXY (VIT D DEFICIENCY, FRACTURES): Vit D, 25-Hydroxy: 13 ng/mL — ABNORMAL LOW (ref 30–100)

## 2023-01-12 LAB — LACTIC ACID, PLASMA: Lactic Acid, Venous: 1.6 mmol/L (ref 0.5–1.9)

## 2023-01-12 LAB — SARS CORONAVIRUS 2 BY RT PCR: SARS Coronavirus 2 by RT PCR: POSITIVE — AB

## 2023-01-12 MED ORDER — BUSPIRONE HCL 10 MG PO TABS
5.0000 mg | ORAL_TABLET | Freq: Three times a day (TID) | ORAL | Status: DC
  Administered 2023-01-12 – 2023-01-17 (×14): 5 mg via ORAL
  Filled 2023-01-12 (×14): qty 1

## 2023-01-12 MED ORDER — METHYLPREDNISOLONE SODIUM SUCC 125 MG IJ SOLR: 125.0000 mg | Freq: Once | INTRAMUSCULAR | Status: DC

## 2023-01-12 MED ORDER — POLYETHYLENE GLYCOL 3350 17 G PO PACK
17.0000 g | PACK | Freq: Every day | ORAL | Status: DC | PRN
  Administered 2023-01-15: 17 g via ORAL
  Filled 2023-01-12: qty 1

## 2023-01-12 MED ORDER — IPRATROPIUM-ALBUTEROL 20-100 MCG/ACT IN AERS
1.0000 | INHALATION_SPRAY | Freq: Four times a day (QID) | RESPIRATORY_TRACT | Status: DC
  Administered 2023-01-12 – 2023-01-17 (×19): 1 via RESPIRATORY_TRACT
  Filled 2023-01-12: qty 4

## 2023-01-12 MED ORDER — HYDRALAZINE HCL 50 MG PO TABS
50.0000 mg | ORAL_TABLET | Freq: Three times a day (TID) | ORAL | Status: DC
  Administered 2023-01-13 – 2023-01-17 (×13): 50 mg via ORAL
  Filled 2023-01-12 (×13): qty 1

## 2023-01-12 MED ORDER — LACTATED RINGERS IV SOLN: INTRAVENOUS | Status: AC

## 2023-01-12 MED ORDER — PREDNISONE 20 MG PO TABS
40.0000 mg | ORAL_TABLET | Freq: Every day | ORAL | Status: DC
  Administered 2023-01-13 – 2023-01-16 (×4): 40 mg via ORAL
  Filled 2023-01-12 (×4): qty 2

## 2023-01-12 MED ORDER — AMLODIPINE BESYLATE 10 MG PO TABS
10.0000 mg | ORAL_TABLET | Freq: Every day | ORAL | Status: DC
  Administered 2023-01-13 – 2023-01-17 (×5): 10 mg via ORAL
  Filled 2023-01-12 (×5): qty 1

## 2023-01-12 MED ORDER — IPRATROPIUM-ALBUTEROL 20-100 MCG/ACT IN AERS
1.0000 | INHALATION_SPRAY | Freq: Four times a day (QID) | RESPIRATORY_TRACT | Status: DC
  Filled 2023-01-12: qty 4

## 2023-01-12 MED ORDER — IPRATROPIUM-ALBUTEROL 0.5-2.5 (3) MG/3ML IN SOLN
3.0000 mL | Freq: Once | RESPIRATORY_TRACT | Status: AC
  Administered 2023-01-12: 3 mL via RESPIRATORY_TRACT
  Filled 2023-01-12: qty 3

## 2023-01-12 MED ORDER — ONDANSETRON HCL 4 MG PO TABS: 4.0000 mg | ORAL_TABLET | Freq: Four times a day (QID) | ORAL | Status: DC | PRN

## 2023-01-12 MED ORDER — LETROZOLE 2.5 MG PO TABS
2.5000 mg | ORAL_TABLET | Freq: Every day | ORAL | Status: DC
  Administered 2023-01-13 – 2023-01-17 (×5): 2.5 mg via ORAL
  Filled 2023-01-12 (×5): qty 1

## 2023-01-12 MED ORDER — ENOXAPARIN SODIUM 30 MG/0.3ML IJ SOSY: 30.0000 mg | PREFILLED_SYRINGE | INTRAMUSCULAR | Status: DC

## 2023-01-12 MED ORDER — ONDANSETRON HCL 4 MG/2ML IJ SOLN: 4.0000 mg | Freq: Four times a day (QID) | INTRAMUSCULAR | Status: DC | PRN

## 2023-01-12 MED ORDER — SODIUM CHLORIDE 0.9 % IV BOLUS
1000.0000 mL | Freq: Once | INTRAVENOUS | Status: AC
  Administered 2023-01-12: 1000 mL via INTRAVENOUS

## 2023-01-12 MED ORDER — PREDNISONE 20 MG PO TABS
40.0000 mg | ORAL_TABLET | Freq: Every day | ORAL | Status: DC
  Administered 2023-01-12: 40 mg via ORAL
  Filled 2023-01-12: qty 2

## 2023-01-12 MED ORDER — ENOXAPARIN SODIUM 40 MG/0.4ML IJ SOSY
40.0000 mg | PREFILLED_SYRINGE | INTRAMUSCULAR | Status: DC
  Administered 2023-01-12 – 2023-01-16 (×5): 40 mg via SUBCUTANEOUS
  Filled 2023-01-12 (×5): qty 0.4

## 2023-01-12 MED ORDER — PANTOPRAZOLE SODIUM 40 MG PO TBEC
40.0000 mg | DELAYED_RELEASE_TABLET | Freq: Two times a day (BID) | ORAL | Status: DC
  Administered 2023-01-12 – 2023-01-17 (×10): 40 mg via ORAL
  Filled 2023-01-12 (×10): qty 1

## 2023-01-12 MED ORDER — ACETAMINOPHEN 325 MG PO TABS: 650.0000 mg | ORAL_TABLET | Freq: Four times a day (QID) | ORAL | Status: DC | PRN

## 2023-01-12 MED ORDER — ACETAMINOPHEN 650 MG RE SUPP: 650.0000 mg | Freq: Four times a day (QID) | RECTAL | Status: DC | PRN

## 2023-01-12 MED ORDER — IPRATROPIUM-ALBUTEROL 0.5-2.5 (3) MG/3ML IN SOLN: 3.0000 mL | Freq: Four times a day (QID) | RESPIRATORY_TRACT | Status: DC

## 2023-01-12 MED ORDER — ENSURE ENLIVE PO LIQD: 237.0000 mL | Freq: Two times a day (BID) | ORAL | Status: DC

## 2023-01-12 MED ORDER — SODIUM CHLORIDE 0.9% FLUSH
3.0000 mL | Freq: Two times a day (BID) | INTRAVENOUS | Status: DC
  Administered 2023-01-13 – 2023-01-17 (×9): 3 mL via INTRAVENOUS

## 2023-01-12 MED ORDER — PREDNISONE 20 MG PO TABS: 40.0000 mg | ORAL_TABLET | Freq: Every day | ORAL | Status: DC

## 2023-01-12 NOTE — Assessment & Plan Note (Signed)
Patient reports 1.5-week history of nonproductive cough and 1 day history of shortness of breath.  Notably diminished breath sounds throughout.  - Continuous pulse oximetry - Continue supplemental oxygen to maintain oxygen saturation above 88% - Wean as tolerated - Start prednisone 40 mg to complete a 5-day course - DuoNebs every 6 hours - Continue home bronchodilators

## 2023-01-12 NOTE — ED Provider Notes (Addendum)
[facility] Provider Note    Event Date/Time   First MD Initiated Contact with Patient 01/12/23 1457     (approximate)   History   Weakness   HPI  Stephanie [name] is a 67 y.o. female  with h/o CVA, breast CA metastatic to liver, here with weakness. Pt was just started on oral chemo/immunotherapy 3 weeks ago and since then has had worsening nausea, vomiting, poor appetite, and decreased energy. She can no longer support herself ambulating. She has had poor appetite, and when she does try to eat she vomits. She denies fevers. She's had a mild cough but no SOB. No CP. No abd pain.       Physical Exam   Triage Vital Signs: ED Triage Vitals  Encounter Vitals Group     BP 01/12/23 1425 124/83     Systolic BP Percentile --      Diastolic BP Percentile --      Pulse Rate 01/12/23 1425 (!) 106     Resp 01/12/23 1425 16     Temp 01/12/23 1425 98.6 F (37 C)     Temp Source 01/12/23 1425 Oral     SpO2 01/12/23 1425 100 %     Weight 01/12/23 1426 180 lb (81.6 kg)     Height 01/12/23 1426 6' (1.829 m)     Head Circumference --      Peak Flow --      Pain Score 01/12/23 1426 5     Pain Loc --      Pain Education --      Exclude from Growth Chart --     Most recent vital signs: Vitals:   01/12/23 1703 01/12/23 1759  BP: 125/68 124/62  Pulse: 95 95  Resp: 16 18  Temp: 98.2 F (36.8 C) 98.7 F (37.1 C)  SpO2: 95% 95%     General: Awake, no distress.  CV:  Good peripheral perfusion. RRR. Resp:  Normal work of breathing. Lungs clear bilaterally. Abd:  No distention. No tenderness. Other:  Dry MM.   ED Results / Procedures / Treatments   Labs (all labs ordered are listed, but only abnormal results are displayed) Labs Reviewed  SARS CORONAVIRUS 2 BY RT PCR - Abnormal; Notable for the following components:      Result Value   SARS Coronavirus 2 by RT PCR POSITIVE (*)    All other components within normal limits  BASIC METABOLIC PANEL -  Abnormal; Notable for the following components:   Sodium 133 (*)    Chloride 97 (*)    Glucose, Bld 112 (*)    BUN 30 (*)    Creatinine, Ser 2.10 (*)    Calcium 10.5 (*)    GFR, Estimated 26 (*)    All other components within normal limits  CBC - Abnormal; Notable for the following components:   RBC 3.36 (*)    Hemoglobin 9.2 (*)    HCT 27.9 (*)    RDW 18.4 (*)    All other components within normal limits  CULTURE, BLOOD (SINGLE)  LACTIC ACID, PLASMA  CBC  COMPREHENSIVE METABOLIC PANEL  PROTIME-INR     EKG Atrial fibrillation, VR 99. QRS 84, QTc 450. No acute ST elevations or depressions. No ischemia or infarct.   RADIOLOGY CXR: No active disease   I also independently reviewed and agree with radiologist interpretations.   PROCEDURES:  Critical Care performed: No   MEDICATIONS ORDERED IN ED: Medications  sodium  chloride flush (NS) 0.9 % injection 3 mL (has no administration in time range)  acetaminophen (TYLENOL) tablet 650 mg (has no administration in time range)    Or  acetaminophen (TYLENOL) suppository 650 mg (has no administration in time range)  polyethylene glycol (MIRALAX / GLYCOLAX) packet 17 g (has no administration in time range)  ondansetron (ZOFRAN) tablet 4 mg (has no administration in time range)    Or  ondansetron (ZOFRAN) injection 4 mg (has no administration in time range)  enoxaparin (LOVENOX) injection 40 mg (has no administration in time range)  ipratropium-albuterol (DUONEB) 0.5-2.5 (3) MG/3ML nebulizer solution 3 mL (has no administration in time range)  predniSONE (DELTASONE) tablet 40 mg (has no administration in time range)  lactated ringers infusion ( Intravenous New Bag/Given 01/12/23 1814)  feeding supplement (ENSURE ENLIVE / ENSURE PLUS) liquid 237 mL (has no administration in time range)  sodium chloride 0.9 % bolus 1,000 mL (1,000 mLs Intravenous New Bag/Given 01/12/23 1546)  ipratropium-albuterol (DUONEB) 0.5-2.5 (3) MG/3ML  nebulizer solution 3 mL (3 mLs Nebulization Given 01/12/23 1600)     IMPRESSION / MDM / ASSESSMENT AND PLAN / ED COURSE  I reviewed the triage vital signs and the nursing notes.                              Differential diagnosis includes, but is not limited to, generalized weakness 2/2 dehydration, occult infection (UTI, PNA), polypharmacy, AKI.  Patient's presentation is most consistent with acute presentation with potential threat to life or bodily function.  The patient is on the cardiac monitor to evaluate for evidence of arrhythmia and/or significant heart rate changes  67 yo F here with weakness, inability to ambulate, n/v/d. Pt weak, dehydrated, sent from Oncology for admission and fluids. Denies fever or signs of sepsis. UA, CXR sent. Labs reviewed from clinic. Hgb 9.8, slightly below baseline.  CMP shows worsening renal function (Cr 1.96, up from 1.25 with BUN 30, Ca 10.9 (corrects to 11.6). AlkP increasing to 416 - known diffuse mets on recent PET scan reviewed by me. Will admit for IVF, sx control.  Of note, COVID+. Repeat BMP here shows worsening renal function. IVF, admit.   FINAL CLINICAL IMPRESSION(S) / ED DIAGNOSES   Final diagnoses:  Dehydration  Generalized weakness  Malignant neoplasm metastatic to breast [facility])     Rx / DC Orders   ED Discharge Orders     None        Note:  This document was prepared using Dragon voice recognition software and may include unintentional dictation errors.   Shaune Pollack, MD 01/12/23 [name]    Shaune Pollack, MD 01/12/23 [phone]

## 2023-01-12 NOTE — Assessment & Plan Note (Signed)
Over the last 3 weeks, patient's calcium levels have been gradually increasing, currently at 10.9 corrected to 11.6 given low albumin.  Although certainly elevated, I would not expect this to cause significant symptoms.  - IV fluid resuscitation, if no improvement can consider Zometa

## 2023-01-12 NOTE — Assessment & Plan Note (Signed)
-   Resume home antihypertensives tomorrow 

## 2023-01-12 NOTE — Progress Notes (Signed)
PHARMACIST - PHYSICIAN COMMUNICATION  CONCERNING:  Enoxaparin (Lovenox) for DVT Prophylaxis    RECOMMENDATION: Patient was prescribed enoxaprin 30mg  q24 hours for VTE prophylaxis.   Filed Weights   01/12/23 1426  Weight: 81.6 kg (180 lb)    Body mass index is 24.41 kg/m.  Estimated Creatinine Clearance: 30.4 mL/min (A) (by C-G formula based on SCr of 2.1 mg/dL (H)).   Based on [facility] policy patient is candidate for enoxaparin 40mg  TBW SQ every 24 hours based on CrCl >41ml/min.  DESCRIPTION: Pharmacy has adjusted enoxaparin dose per [facility] policy.  Patient is now receiving enoxaparin 40 mg every 24 hours    Gardner Candle, PharmD, BCPS Clinical Pharmacist 01/12/2023 4:48 PM

## 2023-01-12 NOTE — Assessment & Plan Note (Addendum)
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  # Currently CDK- inhibitor + AI [Ribociclib+Letrozole] x2 weeks, pt feeling poorly overall. HOLD ribocilib for now-see discussion below.  Continue letrozole at this time.  # nausea/vomitting: Fatigue: Hypercalcemia- ca 10.9; albuimin 3.1/acute on chronic renal failure-see below  # Hx of Stroke-;chronic Right LE  weakness- currently in rehab.  Concerns of bladder bowel incontinence-AUG 2024-MRI without contrast cervical thoracic lumbar spine-no lesions involving the spine.  Awaiting PET scan results.  # Elevated alkaline phosphatase-likely secondary to liver mets.  Again awaiting PET scan-   # I reviewed with the patient and family regarding the significant decline in patient's clinical status overall and above discussed lab findings -is likely multifactorial-likely secondary to progressive malignancy; also possible side effects of Ribociclib; and also possible underlying infection-given the recent mention of low-grade fever; needing oxygen.  *zometa # DISPOSITION: # NO infusion # ER- today # follow up TBD- Dr.B  # 40 minutes face-to-face with the patient discussing the above plan of care; more than 50% of time spent on prognosis/ natural history; counseling and coordination.  Discussed with patient's family-Auntie- Stephanie [name]; and cousin Stephanie [name] and Stephanie [name]; aunt.

## 2023-01-12 NOTE — Assessment & Plan Note (Signed)
Patient was recently admitted, at which time metastatic breast cancer was discovered with metastasis to the liver, bone and abdominal lymph nodes. She has established with Oncology and started on Ribociclib+Letrozole 2 weeks ago.   - Continue outpatient follow-up with oncology. - Patient would certainly benefit from palliative consultation given poor baseline functioning, and expected difficulty with tolerating any future treatment for breast cancer

## 2023-01-12 NOTE — Assessment & Plan Note (Signed)
Over the last month, patient's creatinine has consistently been elevated between 1.2 and 1.5.  No prior labs for the last 2 years, however this does suggest CKD.  Now with elevated creatinine at 1.96, likely due to intractable nausea/vomiting leading to dehydration.  - IV fluids - Strict in and out - Repeat BMP in the a.m. - Avoid nephrotoxic agents

## 2023-01-12 NOTE — Assessment & Plan Note (Addendum)
Patient reports 2+ week history of intractable nausea and vomiting since being started on an oral chemotherapy drug for stage IV metastatic breast cancer.    - IV fluids as ordered for acute kidney injury - Zofran as needed for nausea.  If no relief, could trial Phenergan versus Reglan - Hold home chemotherapy

## 2023-01-12 NOTE — ED Triage Notes (Signed)
Pt has been weak with fever at home is currently on oral chemo medication for stage 4 breast cancer. Pt PCP wanted her to be admitted for dehydration and weakness

## 2023-01-12 NOTE — Progress Notes (Signed)
[facility] CONSULT NOTE  Patient Care Team: Center, [facility] as PCP - General (General Practice) Lorn Junes, FNP (Family Medicine) Brannock, Carlye Grippe, RN Earna Coder, MD as Consulting Physician (Oncology) Hulen Luster, RN as Oncology Nurse Navigator  CHIEF COMPLAINTS/PURPOSE OF CONSULTATION: breast cancer  Oncology History Overview Note  # JULY 2024-liver Biopsy [in pt-[facility]]- [facility];   Estrogen Receptor: 90%, POSITIVE, STRONG STAINING INTENSITY Progesterone Receptor: 80%, POSITIVE, STRONG STAINING INTENSITY Proliferation Marker Ki67: 40%; The tumor cells are EQUIVOCAL for Her2 (2+). Her2 by FISH-NEGATIVE  # AUG 13th, 2024-Letrozole + RIbo   # Hx of stroke    Malignant neoplasm of overlapping sites of left breast in female, estrogen receptor positive (HCC)  12/27/2022 Initial Diagnosis   Malignant neoplasm of overlapping sites of left breast in female, estrogen receptor positive (HCC)   12/27/2022 Cancer Staging   Staging form: Breast, AJCC 8th Edition - Clinical: Stage IV (cT2, cN1, cM1, G2, ER+, PR+, HER2-) - Signed by Earna Coder, MD on 12/27/2022 Histologic grading system: 3 grade system    HISTORY OF PRESENTING ILLNESS: Patient ambulating-with assistance/ in wheel chair.  Accompanied by family.   Stephanie [name] 67 y.o.  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 PET done last week, [facility].  Patient is currently in rehab.  Complains of pain in buttocks 10/10. States no sore. Not eating or drinking x2days. Concerned she is dehydrated.   Drinking boost one tid. Having trouble swallowing solids. Having nausea/vomiting.   SOB, O2 2L. Having trouble walking now, using a walker.   Her appetite is about 50%. c/o rt foot falling asleep at times.   Review of Systems   Constitutional:  Positive for malaise/fatigue and weight loss. Negative for chills, diaphoresis and fever.  HENT:  Negative for nosebleeds and sore throat.   Eyes:  Negative for double vision.  Respiratory:  Negative for cough, hemoptysis, sputum production, shortness of breath and wheezing.   Cardiovascular:  Negative for chest pain, palpitations, orthopnea and leg swelling.  Gastrointestinal:  Negative for abdominal pain, blood in stool, constipation, diarrhea, heartburn, melena, nausea and vomiting.  Genitourinary:  Negative for dysuria, frequency and urgency.  Musculoskeletal:  Positive for joint pain. Negative for back pain.  Skin: Negative.  Negative for itching and rash.  Neurological:  Positive for focal weakness. Negative for dizziness, tingling, weakness and headaches.  Endo/Heme/Allergies:  Does not bruise/bleed easily.  Psychiatric/Behavioral:  Negative for depression. The patient is not nervous/anxious and does not have insomnia.     MEDICAL HISTORY:  Past Medical History:  Diagnosis Date   Diabetes mellitus without complication (HCC)    Hypertension    Stroke (HCC) 05/17/2020    SURGICAL HISTORY: Past Surgical History:  Procedure Laterality Date   ABDOMINAL HYSTERECTOMY     at 67 years old   CLOSED REDUCTION MANDIBLE Bilateral 05/23/2020   Procedure: CLOSED REDUCTION MANDIBULAR;  Surgeon: Vernie Murders, MD;  Location: [facility] ORS;  Service: ENT;  Laterality: Bilateral;   CLOSED REDUCTION MANDIBLE N/A 06/03/2020   Procedure: CLOSED REDUCTION MANDIBULAR;  Surgeon: Linus Salmons, MD;  Location: [facility] ORS;  Service: ENT;  Laterality: N/A;   COLONOSCOPY WITH PROPOFOL N/A 06/10/2020   Procedure: COLONOSCOPY WITH PROPOFOL;  Surgeon: Pasty Spillers, MD;  Location: [facility] ENDOSCOPY;  Service: Endoscopy;  Laterality: N/A;   IR US  LIVER BIOPSY  12/20/2022    SOCIAL HISTORY: Social History   Socioeconomic History   Marital status: Single    Spouse name: Not on file    Number of children: Not on file   Years of education: Not on file   Highest education level: Not on file  Occupational History   Not on file  Tobacco Use   Smoking status: Former    Current packs/day: 0.00    Types: Cigarettes    Quit date: 04/2020    Years since quitting: 2.7   Smokeless tobacco: Never  Vaping Use   Vaping status: Never Used  Substance and Sexual Activity   Alcohol use: Never   Drug use: Never   Sexual activity: Not on file  Other Topics Concern   Not on file  Social History Narrative   Not on file   Social Determinants of Health   Financial Resource Strain: Not on file  Food Insecurity: No Food Insecurity (01/12/2023)   Hunger Vital Sign    Worried About Running Out of Food in the Last Year: Never true    Ran Out of Food in the Last Year: Never true  Transportation Needs: No Transportation Needs (01/12/2023)   PRAPARE - Administrator, Civil Service (Medical): No    Lack of Transportation (Non-Medical): No  Physical Activity: Not on file  Stress: Not on file  Social Connections: Not on file  Intimate Partner Violence: Not At Risk (01/12/2023)   Humiliation, Afraid, Rape, and Kick questionnaire    Fear of Current or Ex-Partner: No    Emotionally Abused: No    Physically Abused: No    Sexually Abused: No    FAMILY HISTORY: Family History  Problem Relation Age of Onset   COPD Mother    Hypertension Mother    Breast cancer Paternal Aunt    Stroke Maternal Grandmother    Hypertension Maternal Grandmother     ALLERGIES:  is allergic to codeine and lisinopril.  MEDICATIONS:  No current facility-administered medications for this visit.   No current outpatient medications on file.   Facility-Administered Medications Ordered in Other Visits  Medication Dose Route Frequency Provider Last Rate Last Admin   acetaminophen (TYLENOL) tablet 650 mg  650 mg Oral Q6H PRN Verdene Lennert, MD       Or   acetaminophen (TYLENOL) suppository 650 mg   650 mg Rectal Q6H PRN Verdene Lennert, MD       [START ON 01/13/2023] amLODipine (NORVASC) tablet 10 mg  10 mg Oral Daily Verdene Lennert, MD       busPIRone (BUSPAR) tablet 5 mg  5 mg Oral TID Verdene Lennert, MD       enoxaparin (LOVENOX) injection 40 mg  40 mg Subcutaneous Q24H Hallaji, Sheema M, RPH       [START ON 01/13/2023] feeding supplement (ENSURE ENLIVE / ENSURE PLUS) liquid 237 mL  237 mL Oral BID BM Verdene Lennert, MD       [START ON 01/13/2023] hydrALAZINE (APRESOLINE) tablet 50 mg  50 mg Oral Q8H Verdene Lennert, MD       Ipratropium-Albuterol (COMBIVENT) respimat 1 puff  1 puff Inhalation QID Verdene Lennert, MD       lactated ringers infusion   Intravenous Continuous Verdene Lennert, MD 125 mL/hr at 01/12/23 1814 New Bag at 01/12/23 1814   [START ON 01/13/2023] letrozole [facility]) tablet 2.5 mg  2.5 mg Oral Daily Verdene Lennert, MD       ondansetron [facility])  tablet 4 mg  4 mg Oral Q6H PRN Verdene Lennert, MD       Or   ondansetron (ZOFRAN) injection 4 mg  4 mg Intravenous Q6H PRN Verdene Lennert, MD       pantoprazole (PROTONIX) EC tablet 40 mg  40 mg Oral BID Verdene Lennert, MD       polyethylene glycol (MIRALAX / GLYCOLAX) packet 17 g  17 g Oral Daily PRN Verdene Lennert, MD       Melene Muller ON 01/13/2023] predniSONE (DELTASONE) tablet 40 mg  40 mg Oral Q breakfast Verdene Lennert, MD       sodium chloride flush (NS) 0.9 % injection 3 mL  3 mL Intravenous Q12H Verdene Lennert, MD        PHYSICAL EXAMINATION:   Vitals:   01/12/23 1247  BP: 119/61  Pulse: 98  Temp: 100 F (37.8 C)  SpO2: 96%   Filed Weights   01/12/23 1247  Weight: 180 lb (81.6 kg)   Chronic weakness of the right lower extremity/Hx of stroke.   Physical Exam Vitals and nursing note reviewed.  HENT:     Head: Normocephalic and atraumatic.     Mouth/Throat:     Pharynx: Oropharynx is clear.  Eyes:     Extraocular Movements: Extraocular movements intact.     Pupils: Pupils are equal, round, and  reactive to light.  Cardiovascular:     Rate and Rhythm: Normal rate and regular rhythm.  Pulmonary:     Comments: Decreased breath sounds bilaterally.  Abdominal:     Palpations: Abdomen is soft.  Musculoskeletal:        General: Normal range of motion.     Cervical back: Normal range of motion.  Skin:    General: Skin is warm.  Neurological:     Mental Status: She is alert and oriented to person, place, and time.  Psychiatric:        Behavior: Behavior normal.        Judgment: Judgment normal.     LABORATORY DATA:  I have reviewed the data as listed Lab Results  Component Value Date   WBC 7.1 01/12/2023   HGB 9.2 (L) 01/12/2023   HCT 27.9 (L) 01/12/2023   MCV 83.0 01/12/2023   PLT 400 01/12/2023   Recent Labs    12/19/22 0427 12/20/22 0421 12/21/22 0402 12/28/22 0922 01/12/23 1238 01/12/23 1522  NA 135 137 136 136 133* 133*  K 4.1 4.2 4.1 3.4* 4.0 4.0  CL 105 104 104 105 97* 97*  CO2 26 23 24 22 24 22   GLUCOSE 107* 99 92 106* 113* 112*  BUN 39* 37* 34* 21 30* 30*  CREATININE 1.58* 1.51* 1.46* 1.25* 1.96* 2.10*  CALCIUM 9.8 10.3 9.9 10.4* 10.9* 10.5*  GFRNONAA 36* 38* 39* 48* 28* 26*  PROT 6.3* 6.5 6.5 7.5 8.1  --   ALBUMIN 2.7* 2.9* 2.9* 3.2* 3.1*  --   AST 88* 119* 305* 133* 85*  --   ALT 40 51* 96* 50* 32  --   ALKPHOS 226* 251* 286* 418* 416*  --   BILITOT 0.8 0.8 1.4* 1.4* 2.8*  --   BILIDIR 0.4* 0.4* 0.4*  --   --   --   IBILI 0.4 0.4 1.0*  --   --   --     RADIOGRAPHIC STUDIES: I have personally reviewed the radiological images as listed and agreed with the findings in the report. DG Chest Portable 1 View  Result  Date: 01/12/2023 CLINICAL DATA:  Shortness of breath. New chemotherapy drug. Nausea and vomiting. EXAM: PORTABLE CHEST 1 VIEW COMPARISON:  Chest radiographs 12/17/2022, 12/14/2022, 06/01/2020; CT chest 12/18/2022 FINDINGS: Cardiac silhouette and mediastinal contours are within normal limits. Mildly decreased lung volumes. Lucencies within  the upper lungs with attenuation of the pulmonary vasculature again indicating emphysematous changes. Mild bibasilar linear scarring is similar to prior. No acute airspace opacity. No pleural effusion or pneumothorax. No acute skeletal abnormality. IMPRESSION: 1. No acute cardiopulmonary process. 2. Chronic emphysematous changes. Electronically Signed   By: Neita Garnet M.D.   On: 01/12/2023 15:44   NM PET Image Initial (PI) Skull Base To Thigh (F-18 FDG)  Result Date: 01/12/2023 CLINICAL DATA:  Initial treatment strategy for metastatic breast cancer. Widespread hepatic abnormalities on MRI suspicious for metastatic disease. Liver biopsy performed 12/20/2022. EXAM: NUCLEAR MEDICINE PET SKULL BASE TO THIGH TECHNIQUE: 10.22 mCi F-18 FDG was injected intravenously. Full-ring PET imaging was performed from the skull base to thigh after the radiotracer. CT data was obtained and used for attenuation correction and anatomic localization. Fasting blood glucose: 83 mg/dl COMPARISON:  Abdominal MRI 12/18/2022, chest CT 12/18/2022 and CTs of the chest, abdomen and pelvis 06/09/2020. FINDINGS: Mediastinal blood pool activity: SUV max 2.2 NECK: Hypermetabolic left supraclavicular nodes measuring up to 9 mm short axis on image 36/4 (SUV max 13.7). No other hypermetabolic cervical lymph nodes. No suspicious activity identified within the pharyngeal mucosal space. Incidental CT findings: none CHEST: In addition to the hypermetabolic left supraclavicular nodes, there are multiple additional hypermetabolic thoracic lymph nodes. The largest left axillary node measures 1.1 cm short axis on image 51/4 and has an SUV max of 14.8. There are several small hypermetabolic left internal mammary lymph nodes (SUV max 7.7) as well as mildly hypermetabolic pretracheal and right hilar lymph nodes. Right cardiophrenic angle node measuring 1.0 cm on image 76/4 has an SUV max of 10.9. The known large mass superiorly in the left breast is also  significantly hypermetabolic. This measures 4.6 x 3.1 cm on image 53/4 and has an SUV max of 15.6. There are several additional smaller hypermetabolic nodules laterally in the left breast. No suspicious right breast activity. Small hypermetabolic nodule anteriorly in the left pleural space measures 3 mm on image 50/6. No hypermetabolic pulmonary activity or suspicious nodularity. Incidental CT findings: Centrilobular and paraseptal emphysema. Mild dependent atelectasis in both lungs. Mild atherosclerosis of the aorta, great vessels and coronary arteries. ABDOMEN/PELVIS: There is extensive hypermetabolic activity throughout the liver consistent with widespread metastatic disease. There is near complete replacement of the normal hepatic parenchyma. Posteriorly in the dome of the right lobe, the metabolic activity has an SUV max of 16.8. There are hypermetabolic lymph nodes in the porta hepatis, largest a portacaval node measuring 1.6 cm on image 97/4 (SUV max 16.9). Nonspecific low level metabolic activity in both adrenal glands (SUV max 5.3 on the left). The adrenal glands appear grossly unchanged from previous CT 06/09/2020. No abnormal metabolic activity the spleen or pancreas. There is no lower abdominal or pelvic adenopathy. Incidental CT findings: Underlying morphologic changes of cirrhosis, similar to previous studies. Pelvic ascites without hypermetabolic activity. Mild aortic atherosclerosis. SKELETON: Single small hypermetabolic lesion in the mid sternal body (SUV max 4.7). No other evidence of osseous metastatic disease. Incidental CT findings: none IMPRESSION: 1. Large primary malignancy in the left breast is hypermetabolic. Additional hypermetabolic nodules more laterally in the left breast. 2. Widespread hypermetabolic metastatic disease throughout the liver with multiple  nodal metastases in the left axilla, left internal mammary chain, left supraclavicular, mediastinum, right hilum and upper abdomen. 3.  Single small hypermetabolic osseous metastasis in the mid sternum. 4. Nonspecific low level metabolic activity in both adrenal glands without morphologic change in the adrenal glands compared with previous CTs. 5. Aortic Atherosclerosis (ICD10-I70.0) and Emphysema (ICD10-J43.9). Electronically Signed   By: Carey Bullocks M.D.   On: 01/12/2023 14:00   IR US LIVER BIOPSY  Result Date: 12/20/2022 INDICATION: Liver masses.  No diagnosis. EXAM: ULTRASOUND GUIDED LIVER MASS BIOPSY COMPARISON:  MRI abdomen 12/18/2022 MEDICATIONS: None ANESTHESIA/SEDATION: Moderate (conscious) sedation was employed during this procedure. A total of Versed 1 mg and Fentanyl 50 mcg was administered intravenously. Moderate Sedation Time: 10 minutes. The patient's level of consciousness and vital signs were monitored continuously by radiology nursing throughout the procedure under my direct supervision. COMPLICATIONS: None immediate. PROCEDURE: Informed written consent was obtained from insufficient wrap after a discussion of the risks, benefits and alternatives to treatment. The patient understands and consents the procedure. A timeout was performed prior to the initiation of the procedure. Ultrasound scanning was performed of the upper abdominal quadrant demonstrates tumor replacement of the LEFT hepatic lobe The LEFT hepatic lobe was selected for biopsy and the procedure was planned. The upper abdominal quadrant was prepped and draped in the usual sterile fashion. The overlying soft tissues were anesthetized with 1% lidocaine. A 17 gauge, 6.8 cm co-axial needle was advanced into a peripheral aspect of the lesion. This was followed by 4 core biopsies with an 18 gauge core device under direct ultrasound guidance. The coaxial needle tract was embolized with a small amount of Gel-Foam slurry and superficial hemostasis was obtained with manual compression. Post procedural scanning was negative for definitive area of hemorrhage or additional  complication. A dressing was placed. The patient tolerated the procedure well without immediate post procedural complication. IMPRESSION: Successful ultrasound guided core needle biopsy of liver mass. [name], MD [facility] Radiology Electronically Signed   By: [name] M.D.   On: 12/20/2022 13:26   US Abdomen Limited RUQ (LIVER/GB)  Result Date: 12/20/2022 CLINICAL DATA:  [id number] Liver mass [id number] EXAM: ULTRASOUND ABDOMEN LIMITED RIGHT UPPER QUADRANT COMPARISON:  CT chest, 12/18/2022. MR abdomen, 12/18/2022. US Abdomen, 12/14/2022. FINDINGS: Gallbladder: No gallstones or wall thickening visualized. No sonographic Murphy sign noted by sonographer. Common bile duct: Diameter: 0.5 cm Liver: Nodular contour liver. Heterogeneous hepatic parenchymal echogenicity, with innumerable liver lesions scattered within the RIGHT and LEFT lobes of the liver. Single largest lesion identified within the RIGHT lobe measures up to 2.7 cm. Portal vein is patent on color Doppler imaging with normal direction of blood flow towards the liver. Other: No perihepatic ascites. IMPRESSION: 1. Innumerable lesions, scattered within the liver. 2. No biliary ductal dilatation. Electronically Signed   By: [name] M.D.   On: 12/20/2022 12:10   CT CHEST WO CONTRAST  Result Date: 12/18/2022 CLINICAL DATA:  Evaluate for occult malignancy EXAM: CT CHEST WITHOUT CONTRAST TECHNIQUE: Multidetector CT imaging of the chest was performed following the standard protocol without IV contrast. RADIATION DOSE REDUCTION: This exam was performed according to the departmental dose-optimization program which includes automated exposure control, adjustment of the mA and/or kV according to patient size and/or use of iterative reconstruction technique. COMPARISON:  06/09/2020 FINDINGS: Cardiovascular: Scattered calcifications are seen in thoracic aorta. There is ectasia of right main pulmonary artery  measuring 3.4 cm. Mediastinum/Nodes: There is 1.8 x 1.2  cm lymph node at the right cardiophrenic angle. There are enlarged lymph nodes in left axilla measuring up to 1.4 cm in short axis. There is 4.5 x 3.4 cm soft tissue mass in the upper aspect of left breast. There is skin thickening adjacent to this mass. Lungs/Pleura: Centrilobular emphysema is seen. Small linear patchy infiltrate is seen in right lower lobe. Increased markings are seen in the posterior segment of right upper lobe. In image 59 of series 4, there is 3 mm pleural-based nodule in left upper lobe. In image 50, there is a 2 mm nodule in the lateral aspect of right upper lobe. In image 68, there is a 3 mm nodule inseparable from the posterior aspect of minor fissure. There is no pleural effusion or pneumothorax. Upper Abdomen: Lobulations are seen in the margin of the liver. There are multiple low-density space-occupying lesions suggesting hepatic metastatic disease. There is 2 mm calcific density in the upper pole of left kidney, possibly left renal stone. Musculoskeletal: No focal lytic or sclerotic lesions are seen. IMPRESSION: There is 4.5 x 3.4 cm new soft tissue mass in the upper portion of left breast. This may suggest primary or metastatic malignant neoplasm. There are abnormally enlarged lymph nodes in left axilla suggesting metastatic lymphadenopathy. There is 1.8 x 1.2 cm lymph node at the right cardiophrenic angle suggesting possible metastatic lymphadenopathy. PET-CT and tissue sampling may be considered. There are a few scattered tiny nodular densities in both lungs measuring up to 3 mm. This finding may suggest incidental granulomas or early pulmonary metastatic disease. There are space-occupying lesions in liver suggesting hepatic metastatic disease. Liver margin is lobulated suggesting possible cirrhosis. There is 2 mm left renal calculus. Electronically Signed   By: Ernie Avena M.D.   On: 12/18/2022 11:29   MR ABDOMEN W WO  CONTRAST  Result Date: 12/18/2022 CLINICAL DATA:  Liver lesions, cirrhosis EXAM: MRI ABDOMEN WITHOUT AND WITH CONTRAST TECHNIQUE: Multiplanar multisequence MR imaging of the abdomen was performed both before and after the administration of intravenous contrast. CONTRAST:  7mL GADAVIST GADOBUTROL 1 MMOL/ML IV SOLN COMPARISON:  MR thoracic spine, 12/17/2022, CT chest abdomen pelvis, 06/09/2020 FINDINGS: Lower chest: No acute abnormality. Hepatobiliary: Hepatomegaly, maximum coronal span 23.0 cm. Coarse, nodular contour of the liver. Very extensive, nearly confluent masslike and nodular signal abnormality throughout the liver (series 4, image 17). Although multiphasic contrast enhanced sequences are significantly limited by breath motion artifact, these lesions are mostly isoenhancing to liver parenchyma, however some are hypoenhancing, for example in the anterior right lobe of the liver (series 20, image 16) and some are faintly hyperenhancing, for example in the peripheral inferior right lobe of the liver (series 20, image 36). Given number and confluent character these are difficult to distinctly measure however a relatively solitary index lesion in the posterior right lobe of the liver, hepatic segment VI, measures 3.2 x 2.8 cm (series 3, image 24). No gallstones, gallbladder wall thickening, or biliary dilatation. Pancreas: Unremarkable. No pancreatic ductal dilatation or surrounding inflammatory changes. Spleen: Normal in size without significant abnormality. Adrenals/Urinary Tract: Adrenal glands are unremarkable. Simple, benign bilateral renal cortical cysts, for which no further follow-up or characterization is required. Kidneys are otherwise normal, without obvious renal calculi, solid lesion, or hydronephrosis. Stomach/Bowel: Stomach is within normal limits. No evidence of bowel wall thickening, distention, or inflammatory changes. Vascular/Lymphatic: No significant vascular findings are present. No  enlarged abdominal lymph nodes. Other: No abdominal wall hernia or abnormality. No ascites. Probable partially imaged left  breast mass (series 4, image 1) Musculoskeletal: No acute osseous findings. Diffuse, heterogeneous marrow enhancement (series 19, image 53). IMPRESSION: 1. Hepatomegaly with very extensive, nearly confluent masslike and nodular signal abnormality throughout the liver, most consistent with extensive hepatic metastatic disease. 2. Diffuse, heterogeneous marrow enhancement, highly suspicious for diffuse osseous metastatic disease. 3. Probable partially imaged left breast mass. Constellation of findings is highly suggestive of diffusely metastatic breast malignancy. Consider additional imaging of the chest and correlation with mammography. Electronically Signed   By: Jearld Lesch M.D.   On: 12/18/2022 05:26   ECHOCARDIOGRAM COMPLETE  Result Date: 12/17/2022    ECHOCARDIOGRAM REPORT   Patient Name:   Stephanie [name] Date of Exam: 12/17/2022 Medical Rec #:  [id number]       Height:       72.0 in Accession #:    [id number]      Weight:       163.1 lb Date of Birth:  [birth year]       BSA:          1.954 m Patient Age:    67 years        BP:           120/64 mmHg Patient Gender: F               HR:           106 bpm. Exam Location:  [facility] Procedure: 2D Echo, Cardiac Doppler and Color Doppler Indications:     Dyspnea R06.00  History:         Patient has prior history of Echocardiogram examinations, most                  recent 05/19/2020. Stroke; Risk Factors:Hypertension and                  Diabetes.  Sonographer:     [name] Referring Phys:  [id number] Marcelino Duster Diagnosing Phys: Debbe Odea MD  Sonographer Comments: Suboptimal apical window. IMPRESSIONS  1. Left ventricular ejection fraction, by estimation, is 70 to 75%. The left ventricle has hyperdynamic function. The left ventricle has no regional wall motion abnormalities. There is mild left ventricular hypertrophy. Left ventricular  diastolic parameters are consistent with Grade I diastolic dysfunction (impaired relaxation).  2. Right ventricular systolic function is normal. The right ventricular size is normal.  3. The mitral valve is normal in structure. No evidence of mitral valve regurgitation.  4. The aortic valve is grossly normal. Aortic valve regurgitation is not visualized.  5. The inferior vena cava is normal in size with greater than 50% respiratory variability, suggesting right atrial pressure of 3 mmHg. FINDINGS  Left Ventricle: Left ventricular ejection fraction, by estimation, is 70 to 75%. The left ventricle has hyperdynamic function. The left ventricle has no regional wall motion abnormalities. The left ventricular internal cavity size was normal in size. There is mild left ventricular hypertrophy. Left ventricular diastolic parameters are consistent with Grade I diastolic dysfunction (impaired relaxation). Right Ventricle: The right ventricular size is normal. No increase in right ventricular wall thickness. Right ventricular systolic function is normal. Left Atrium: Left atrial size was normal in size. Right Atrium: Right atrial size was normal in size. Pericardium: There is no evidence of pericardial effusion. Mitral Valve: The mitral valve is normal in structure. No evidence of mitral valve regurgitation. Tricuspid Valve: The tricuspid valve is normal in structure. Tricuspid valve regurgitation is not demonstrated. Aortic Valve: The aortic valve is  grossly normal. Aortic valve regurgitation is not visualized. Aortic valve mean gradient measures 9.0 mmHg. Aortic valve peak gradient measures 16.0 mmHg. Aortic valve area, by VTI measures 3.68 cm. Pulmonic Valve: The pulmonic valve was normal in structure. Pulmonic valve regurgitation is not visualized. Aorta: The aortic root is normal in size and structure. Venous: The inferior vena cava was not well visualized. The inferior vena cava is normal in size with greater than 50%  respiratory variability, suggesting right atrial pressure of 3 mmHg. IAS/Shunts: No atrial level shunt detected by color flow Doppler.  LEFT VENTRICLE PLAX 2D LVIDd:         4.60 cm   Diastology LVIDs:         2.80 cm   LV e' medial:    9.57 cm/s LV PW:         1.10 cm   LV E/e' medial:  7.9 LV IVS:        1.20 cm   LV e' lateral:   5.98 cm/s LVOT diam:     2.10 cm   LV E/e' lateral: 12.7 LV SV:         93 LV SV Index:   48 LVOT Area:     3.46 cm  RIGHT VENTRICLE RV S prime:     22.70 cm/s TAPSE (M-mode): 2.6 cm LEFT ATRIUM             Index        RIGHT ATRIUM           Index LA diam:        3.90 cm 2.00 cm/m   RA Area:     16.20 cm LA Vol (A2C):   29.6 ml 15.15 ml/m  RA Volume:   42.50 ml  21.75 ml/m LA Vol (A4C):   45.1 ml 23.08 ml/m LA Biplane Vol: 39.5 ml 20.22 ml/m  AORTIC VALVE AV Area (Vmax):    3.41 cm AV Area (Vmean):   3.07 cm AV Area (VTI):     3.68 cm AV Vmax:           200.00 cm/s AV Vmean:          133.000 cm/s AV VTI:            0.253 m AV Peak Grad:      16.0 mmHg AV Mean Grad:      9.0 mmHg LVOT Vmax:         197.00 cm/s LVOT Vmean:        118.000 cm/s LVOT VTI:          0.269 m LVOT/AV VTI ratio: 1.06  AORTA Ao Root diam: 2.80 cm MITRAL VALVE                TRICUSPID VALVE MV Area (PHT): 6.32 cm     TR Peak grad:   15.8 mmHg MV Decel Time: 120 msec     TR Vmax:        199.00 cm/s MV E velocity: 76.00 cm/s MV A velocity: 159.00 cm/s  SHUNTS MV E/A ratio:  0.48         Systemic VTI:  0.27 m                             Systemic Diam: 2.10 cm Debbe Odea MD Electronically signed by Debbe Odea MD Signature Date/Time: 12/17/2022/2:23:47 PM    Final    DG Chest 1 View  Result Date: 12/17/2022 CLINICAL DATA:  Shortness of breath. EXAM: CHEST  1 VIEW COMPARISON:  12/14/2022 FINDINGS: Single-view of the chest demonstrates slightly low lung volumes. No focal airspace disease or consolidation. No overt pulmonary edema. Heart and mediastinum are grossly stable. Patient appears to be  mildly rotated on this examination. No acute bone abnormality. IMPRESSION: No active disease. Electronically Signed   By: Richarda Overlie M.D.   On: 12/17/2022 13:28   MR THORACIC SPINE W WO CONTRAST  Result Date: 12/17/2022 CLINICAL DATA:  Initial evaluation for right lower extremity weakness. EXAM: MRI THORACIC WITHOUT AND WITH CONTRAST TECHNIQUE: Multiplanar and multiecho pulse sequences of the thoracic spine were obtained without and with intravenous contrast. CONTRAST:  7mL GADAVIST GADOBUTROL 1 MMOL/ML IV SOLN COMPARISON:  None Available. FINDINGS: Alignment: Trace dextroscoliosis with mild straightening of the normal midthoracic kyphosis. No listhesis. Vertebrae: Vertebral body height maintained without acute or chronic fracture. Bone marrow signal intensity markedly heterogeneous. Few small STIR hyperintense lesions involving the teeth to through T4 vertebral bodies noted, favored to reflect atypical hemangiomata. No other worrisome osseous lesions. No other abnormal marrow edema or enhancement. Cord:  Normal signal and morphology.  No abnormal enhancement. Paraspinal and other soft tissues: Paraspinous soft tissues are within normal limits. Trace layering bilateral pleural effusions noted. Innumerable cystic lesions noted within the partially visualized liver, incompletely assessed on this exam. Disc levels: Ordinary for age multilevel disc desiccation seen throughout the thoracic spine. No significant disc bulge or focal disc herniation. Posterior element hypertrophy noted at T6-7. No significant spinal stenosis. Foramina remain patent. No neural impingement. IMPRESSION: 1. Normal MRI appearance of the thoracic spinal cord. No findings to explain patient's symptoms identified. 2. Minor thoracic spondylosis for age. No significant stenosis or neural impingement. 3. Innumerable cystic lesions within the partially visualized liver, incompletely assessed on this exam. Again, further evaluation with dedicated MRI  of the abdomen, with and without contrast, recommended for further evaluation. Electronically Signed   By: Rise Mu M.D.   On: 12/17/2022 04:40   MR CERVICAL SPINE W WO CONTRAST  Result Date: 12/16/2022 CLINICAL DATA:  Initial evaluation for weakness, hyper reflexia. EXAM: MRI CERVICAL SPINE WITHOUT AND WITH CONTRAST TECHNIQUE: Multiplanar and multiecho pulse sequences of the cervical spine, to include the craniocervical junction and cervicothoracic junction, were obtained without and with intravenous contrast. CONTRAST:  7mL GADAVIST GADOBUTROL 1 MMOL/ML IV SOLN COMPARISON:  None Available. FINDINGS: Alignment: Examination mildly degraded by motion. Straightening of the normal cervical lordosis. No significant listhesis. Vertebrae: Vertebral body height maintained without acute or chronic fracture. Bone marrow signal intensity diffusely heterogeneous. Subcentimeter stir hyperintense lesions involving the T2 and T3 vertebral bodies noted, favored to reflect small atypical hemangiomata noted. No worrisome osseous lesions. No other abnormal marrow edema or enhancement. Cord: Normal signal and morphology. No convincing cord signal changes seen on this motion degraded exam. No abnormal enhancement. Posterior Fossa, vertebral arteries, paraspinal tissues: Patient's known cavernous ICA aneurysm noted, partially visualized. Craniocervical junction within normal limits. Paraspinous soft tissues within normal limits. Normal flow voids seen within the vertebral arteries bilaterally. Disc levels: C2-C3: Mild disc bulge. No spinal stenosis. Foramina remain patent. C3-C4: Mild disc bulge with uncovertebral spurring. Flattening and partial effacement of the ventral thecal sac with resultant mild spinal stenosis. Mild left C4 foraminal narrowing. Right neural foramen remains patent. C4-C5: Mild diffuse disc bulge with uncovertebral spurring. Flattening and partial effacement of the ventral thecal sac. Mild cord  flattening without cord signal changes. Mild  spinal stenosis. Foramina remain patent. C5-C6: Mild disc bulge with uncovertebral spurring. Flattening and partial effacement of the ventral thecal sac with resultant mild spinal stenosis. Foramina remain patent. C6-C7: Degenerative intervertebral disc space narrowing with diffuse disc osteophyte complex. Posterior component flattens and partially effaces the ventral thecal sac. No more than mild spinal stenosis. Mild left C7 foraminal narrowing. Right neural foramen remains patent. C7-T1: Mild degenerative disc space narrowing with diffuse disc bulge and uncovertebral spurring. Mild bilateral facet hypertrophy. Resultant mild spinal stenosis. Mild left C8 foraminal narrowing. Right neural foramen remains patent. IMPRESSION: 1. Normal MRI appearance of the cervical spinal cord. No cord signal changes to suggest myelopathy or other abnormality. 2. Multilevel cervical spondylosis with resultant mild diffuse spinal stenosis at C3-4 through C7-T1. 3. Mild left C4, C7, and C8 foraminal stenosis related to disc bulge and uncovertebral disease. 4. 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 Electronically Signed   By: Rise Mu M.D.   On: 12/16/2022 06:21   MR LUMBAR SPINE WO CONTRAST  Result Date: 12/14/2022 CLINICAL DATA:  Initial evaluation for acute myelopathy. EXAM: MRI LUMBAR SPINE WITHOUT CONTRAST TECHNIQUE: Multiplanar, multisequence MR imaging of the lumbar spine was performed. No intravenous contrast was administered. COMPARISON:  None Available. FINDINGS: Segmentation:  Standard. Alignment:  Physiologic. Vertebrae: Vertebral body height maintained without acute or chronic fracture. Bone marrow signal intensity diffusely heterogeneous. No worrisome osseous lesions or abnormal marrow edema. Conus medullaris and cauda equina: Conus extends to the T12 level. Conus and cauda equina appear normal. Paraspinal and other  soft tissues: Paraspinous soft tissues within normal limits. Few scattered T2 hyperintense cyst noted about the kidneys, largest of which measures 2 cm on the left. These are benign in appearance, with no follow-up imaging recommended. Innumerable cystic lesions noted within the partially visualized liver. Disc levels: L1-2: Disc desiccation with minimal annular bulge. Mild facet hypertrophy. No stenosis. L2-3: Degenerative intervertebral disc space narrowing with circumferential disc bulge and disc desiccation. Mild reactive endplate spurring. Mild bilateral facet hypertrophy. No significant spinal stenosis. Foramina remain patent. L3-4: Disc desiccation with mild diffuse disc bulge. Mild reactive endplate spurring. Mild facet and ligament flavum hypertrophy. Resultant mild-to-moderate spinal stenosis. Mild bilateral L3 foraminal narrowing. L4-5: Disc desiccation with diffuse disc bulge. Superimposed small right subarticular disc protrusion with slight inferior migration and annular fissure. Protruding disc contacts the descending right L5 nerve root in the right lateral recess (series 12, image 31). Moderate bilateral facet arthrosis. Resultant moderate canal with bilateral subarticular stenosis. Mild bilateral L4 foraminal narrowing. L5-S1:  Negative interspace.  Mild facet hypertrophy.  No stenosis. IMPRESSION: 1. Disc bulge with small right subarticular disc protrusion at L4-5, potentially affecting the descending right L5 nerve root. Moderate canal with mild bilateral L4 foraminal stenosis at this level. 2. Disc bulge with facet hypertrophy at L3-4 with resultant mild to moderate spinal stenosis, with mild bilateral L3 foraminal narrowing. 3. Innumerable cystic lesions within the visualized liver. While these could reflect benign cysts, these are incompletely assessed on this exam. Correlation with dedicated abdominal MRI recommended for further evaluation. Electronically Signed   By: Rise Mu M.D.    On: 12/14/2022 22:30   US ABDOMEN LIMITED RUQ (LIVER/GB)  Result Date: 12/14/2022 CLINICAL DATA:  Right upper quadrant pain and elevated liver function test. EXAM: ULTRASOUND ABDOMEN LIMITED RIGHT UPPER QUADRANT COMPARISON:  None Available. FINDINGS: Gallbladder: Gallbladder is partially contracted. No gallstones or wall thickening visualized (3.3 mm). No sonographic Eulah Pont  sign noted by sonographer. Common bile duct: Diameter: 1.6 mm Liver: A 1.2 cm x 1.0 cm x 1.2 cm hypoechoic area is seen within the right lobe of the liver. The hepatic parenchyma is nodular in contour and coarse in echogenicity. Portal vein is patent on color Doppler imaging with normal direction of blood flow towards the liver. Other: None. IMPRESSION: 1. Hepatic cirrhosis. 2. 1.2 cm x 1.0 cm x 1.2 cm hypoechoic liver lesion, as described above. MRI correlation is recommended to further exclude the presence of an underlying neoplastic process. Electronically Signed   By: Aram Candela M.D.   On: 12/14/2022 18:10   MR BRAIN WO CONTRAST  Result Date: 12/14/2022 CLINICAL DATA:  Neuro deficit, acute, stroke suspected. Weakness over the last 3 weeks EXAM: MRI HEAD WITHOUT CONTRAST TECHNIQUE: Multiplanar, multiecho pulse sequences of the brain and surrounding structures were obtained without intravenous contrast. COMPARISON:  Head CT same day.  MRI 05/18/2020. FINDINGS: Brain: Diffusion imaging does not show any acute or subacute infarction. Mild chronic small-vessel ischemic change affects pons. 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 Cerebral hemispheres show moderate to marked chronic small-vessel ischemic changes of the white matter, similar to the prior exam. No large vessel territory stroke. No mass lesion, hemorrhage, hydrocephalus or extra-axial collection. 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 Skull and upper cervical spine:  Negative Sinuses/Orbits: Clear/normal Other: None IMPRESSION: 1. No acute finding. Moderate to marked chronic small-vessel ischemic changes of the pons and cerebral hemispheric white matter, similar to the study of 05/18/2020. 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 2. 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 Electronically Signed   By: Paulina Fusi M.D.   On: 12/14/2022 13:53   CT Head Wo Contrast  Result Date: 12/14/2022 CLINICAL DATA:  Neuro deficit, acute, stroke suspected. Weakness with inability to raise the right leg. Recent falls. EXAM: CT HEAD WITHOUT CONTRAST TECHNIQUE: Contiguous axial images were obtained from the base of the skull through the vertex without intravenous contrast. RADIATION DOSE REDUCTION: This exam was performed according to the departmental dose-optimization program which includes automated exposure control, adjustment of the mA and/or kV according to patient size and/or use of iterative reconstruction technique. COMPARISON:  Head and neck CTA 05/19/2020 FINDINGS: Brain: There is no evidence of an acute infarct, intracranial hemorrhage, mass, midline shift, or extra-axial fluid collection. Mild cerebral atrophy is within normal limits for age. Patchy hypodensities in the cerebral white matter are similar to the prior study and are nonspecific but compatible with moderate chronic small vessel ischemic disease. Vascular: Calcified atherosclerosis at the skull base. Known 1.4 cm peripherally calcified right cavernous ICA aneurysm, similar in size to the prior CTA. Skull: No acute fracture or suspicious osseous lesion. Sinuses/Orbits: Visualized paranasal sinuses and mastoid air cells are clear. Unremarkable orbits. Other: None. IMPRESSION: 1. No evidence of acute intracranial abnormality. 2. Moderate chronic small vessel ischemic disease. 3. Known right cavernous ICA aneurysm. Electronically Signed   By: Sebastian Ache  M.D.   On: 12/14/2022 11:51   DG Chest Port 1 View  Result Date: 12/14/2022 CLINICAL DATA:  Weakness. EXAM: PORTABLE CHEST 1 VIEW COMPARISON:  Chest radiograph 06/01/2020 and CT 06/09/2020 FINDINGS: The cardiomediastinal silhouette is unchanged with normal heart size. Minor bilateral lung scarring is noted. No acute airspace consolidation, edema, sizable pleural effusion, or pneumothorax is identified. No acute osseous abnormality is  seen. IMPRESSION: No active disease. Electronically Signed   By: Sebastian Ache M.D.   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  # Currently CDK- inhibitor + AI [Ribociclib+Letrozole] x2 weeks, pt feeling poorly overall. HOLD ribocilib for now-see discussion below.  Continue letrozole at this time.  # nausea/vomitting: Fatigue: Hypercalcemia- ca 10.9; albuimin 3.1/acute on chronic renal failure-see below  # Hx of Stroke-;chronic Right LE  weakness- currently in rehab.  Concerns of bladder bowel incontinence-AUG 2024-MRI without contrast cervical thoracic lumbar spine-no lesions involving the spine.  Awaiting PET scan results.  # Elevated alkaline phosphatase-likely secondary to liver mets.  Again awaiting PET scan-   # I reviewed with the patient and family regarding the significant decline in patient's clinical status overall and above discussed lab findings -is likely multifactorial-likely secondary to progressive malignancy; also possible side effects of Ribociclib; and also possible underlying infection-given the recent mention of low-grade fever; needing oxygen.  *zometa # DISPOSITION: # NO infusion # ER- today # follow up TBD- Dr.B  # 40 minutes face-to-face with the patient discussing the above plan of care; more than 50% of time spent on prognosis/ natural history;  counseling and coordination.  Discussed with patient's family-Auntie- [place]; and cousin [place] and [place]; aunt.   Above plan of care was discussed with patient/family in detail.  My contact information was given to the patient/family.     Earna Coder, MD 01/12/2023 8:34 PM

## 2023-01-12 NOTE — Assessment & Plan Note (Signed)
In the setting of hepatic metastasis.  Relatively stable at this time  - Continue to monitor while admitted

## 2023-01-12 NOTE — Assessment & Plan Note (Signed)
Patient complaining of generalized weakness, likely exacerbated by the initiation of chemotherapy and subsequent nausea/vomiting.  No focal symptoms to suggest central process.  - PT/OT

## 2023-01-12 NOTE — Assessment & Plan Note (Signed)
Patient is reporting a 1.5-week history of a nonproductive cough and 1 day history of shortness of breath.  Chest x-ray is reassuring, however COVID-19 likely led to a COPD exacerbation.  She is outside of the window for any antivirals but will initiate COPD exacerbation treatment as noted below.  - Supportive management with Tussionex as needed for cough - Tylenol as needed for fever

## 2023-01-12 NOTE — ED Triage Notes (Signed)
First nurse: Pt has been having side effects from new chemo drug. Pt reports N/V and increased weakness.

## 2023-01-12 NOTE — Progress Notes (Signed)
Pain in buttocks 10/10. States no sore.  Not eating or drinking x2days. Concerned she is dehydrated.  PET done last week, [facility].  Drinking boost one tid. Having trouble swallowing solids.  Having nausea/vomiting.  SOB, O2 2L.  Having trouble walking now, using a walker.

## 2023-01-12 NOTE — H&P (Addendum)
History and Physical    Patient: Stephanie [name] BJY:[id number] DOB: 30-Mar-1956 DOA: 01/12/2023 DOS: the patient was seen and examined on 01/12/2023 PCP: [facility]  Patient coming from: SNF  Chief Complaint:  Chief Complaint  Patient presents with   Weakness   HPI: Stephanie [name] is a 67 y.o. 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 states that since starting the new chemotherapy drug approximately 2 weeks ago, her appetite quickly diminished and she began to experience persistent nausea with vomiting that was nonbloody, nonbilious.  Since her p.o. intake has decreased and the vomiting started, she developed diffuse generalized weakness.  She states that previously she was able to walk with a walker and bathe herself, but she is unable to do those things at this time.  [name] also endorses a cough that is nonproductive and has been occurring for approximately 1.5 weeks.  Then yesterday, she developed shortness of breath.  She denies any chest pain or palpitations.  ED course: On arrival to the ED, patient was normotensive at 124/83 with heart rate of 98.  She was saturating at 100% on room air.  She was afebrile at 98.6. Initial workup notable for hemoglobin of 9.2, sodium of 133, bicarb 24, glucose 113, BUN 30, creatinine 1.96, calcium 10.9, alkaline phosphatase 416, AST 85, total bilirubin 2.8 and GFR 28.  Chest x-ray was obtained with no acute opacities noted.  COVID-19 PCR pending.  Due to severity of symptoms.  TRH contacted for admission.  Review of Systems: As mentioned in the history of present illness. All other systems reviewed and are negative.  Past Medical History:  Diagnosis Date   Diabetes mellitus without complication (HCC)     Hypertension    Stroke (HCC) 05/17/2020   Past Surgical History:  Procedure Laterality Date   ABDOMINAL HYSTERECTOMY     at 67 years old   CLOSED REDUCTION MANDIBLE Bilateral 05/23/2020   Procedure: CLOSED REDUCTION MANDIBULAR;  Surgeon: Vernie Murders, MD;  Location: [facility] ORS;  Service: ENT;  Laterality: Bilateral;   CLOSED REDUCTION MANDIBLE N/A 06/03/2020   Procedure: CLOSED REDUCTION MANDIBULAR;  Surgeon: Linus Salmons, MD;  Location: [facility] ORS;  Service: ENT;  Laterality: N/A;   COLONOSCOPY WITH PROPOFOL N/A 06/10/2020   Procedure: COLONOSCOPY WITH PROPOFOL;  Surgeon: Pasty Spillers, MD;  Location: [facility] ENDOSCOPY;  Service: Endoscopy;  Laterality: N/A;   IR US LIVER BIOPSY  12/20/2022   Social History:  reports that she quit smoking about 2 years ago. Her smoking use included cigarettes. She has never used smokeless tobacco. She reports that she does not drink alcohol and does not use drugs.  Allergies  Allergen Reactions   Codeine Other (See Comments)    hallucinations   Lisinopril Swelling    Family History  Problem Relation Age of Onset   COPD Mother    Hypertension Mother    Breast cancer Paternal Aunt    Stroke Maternal Grandmother    Hypertension Maternal Grandmother     Prior to Admission medications   Medication Sig Start Date End Date Taking? Authorizing Provider  amLODipine (NORVASC) 10 MG tablet TAKE ONE TABLET BY MOUTH ONCE EVERY DAY FOR HIGH BLOOD PRESSURE 08/19/21 12/14/22    bisacodyl (DULCOLAX) 5 MG EC tablet Take 2 tablets (10 mg  total) by mouth daily as needed for moderate constipation. 12/21/22   Gillis Santa, MD  hydrALAZINE (APRESOLINE) 50 MG tablet TAKE ONE TABLET BY MOUTH 3 TIMES A DAY 08/19/21 12/14/22    Ipratropium-Albuterol (COMBIVENT) 20-100 MCG/ACT AERS respimat Inhale 1 puff into the lungs every 6 (six) hours. 05/29/20   [name], MD  letrozole [facility]) 2.5 MG tablet Take 1 tablet (2.5 mg total) by mouth daily. 12/28/22   Earna Coder, MD  pantoprazole (PROTONIX) 40 MG tablet TAKE ONE TABLET BY MOUTH 2 TIMES A DAY 08/19/21 12/14/22    polyethylene glycol (MIRALAX / GLYCOLAX) 17 g packet Take 17 g by mouth 2 (two) times daily. Patient not taking: Reported on 12/28/2022 12/21/22   Gillis Santa, MD  ribociclib succ [facility] 600MG  DAILY DOSE) 200 MG Therapy Pack Take 3 tablets (600 mg total) by mouth daily. Take for 21 days on, 7 days off, repeat every 28 days. 12/29/22   Earna Coder, MD  lisinopril (ZESTRIL) 5 MG tablet TAKE ONE TABLET BY MOUTH EVERY DAY Patient not taking: Reported on 02/24/2021 05/29/20 08/19/21  [name], MD    Physical Exam: Vitals:   01/12/23 1425 01/12/23 1426  BP: 124/83   Pulse: (!) 106   Resp: 16   Temp: 98.6 F (37 C)   TempSrc: Oral   SpO2: 100%   Weight:  81.6 kg  Height:  6' (1.829 m)   Physical Exam Vitals and nursing note reviewed.  Constitutional:      Appearance: She is normal weight.  HENT:     Head: Normocephalic and atraumatic.     Mouth/Throat:     Mouth: Mucous membranes are dry.     Pharynx: Oropharynx is clear.  Cardiovascular:     Rate and Rhythm: Normal rate and regular rhythm.     Heart sounds: No murmur heard.    No gallop.  Pulmonary:     Effort: No tachypnea or accessory muscle usage.     Breath sounds: Decreased breath sounds (notably diminshed breathe sounds throughout) present. No wheezing, rhonchi or rales.  Abdominal:     General: Bowel sounds are normal. There is no distension.     Palpations: Abdomen is soft.     Tenderness: There is no abdominal tenderness. There is no guarding.  Musculoskeletal:     Comments: Trivial bilateral pitting edema  Skin:    General: Skin is warm and dry.  Neurological:     General: No focal deficit present.     Mental Status: She is alert and oriented to person, place, and time.     Comments: 3/5 strength throughout  Psychiatric:        Mood and Affect: Mood normal.        Behavior: Behavior normal.    Data  Reviewed: CBC with WBC of 7.1, hemoglobin 9.2, platelets of 400 CMP with sodium of 133, potassium 4.0, bicarb 24, glucose 113, BUN 30, creatinine 0.96, GFR of 28, calcium 10.9, albumin 3.1, alkaline phosphatase 416, AST 85, ALT 32.  EKG personally reviewed. Concerning for atrial flutter with rate of 99.  No ST or T wave changes concerning for acute ischemia. Telemetry reviewed. Sinus rhythm. Likely previous EKG abnormality due to artifact  DG Chest Portable 1 View  Result Date: 01/12/2023 CLINICAL DATA:  Shortness of breath. New chemotherapy drug. Nausea and vomiting. EXAM: PORTABLE CHEST 1 VIEW COMPARISON:  Chest radiographs 12/17/2022, 12/14/2022, 06/01/2020; CT chest 12/18/2022 FINDINGS: Cardiac silhouette and mediastinal contours are within  normal limits. Mildly decreased lung volumes. Lucencies within the upper lungs with attenuation of the pulmonary vasculature again indicating emphysematous changes. Mild bibasilar linear scarring is similar to prior. No acute airspace opacity. No pleural effusion or pneumothorax. No acute skeletal abnormality. IMPRESSION: 1. No acute cardiopulmonary process. 2. Chronic emphysematous changes. Electronically Signed   By: Neita Garnet M.D.   On: 01/12/2023 15:44    Results are pending, will review when available.  Assessment and Plan:  * Intractable vomiting with nausea Patient reports 2+ week history of intractable nausea and vomiting since being started on an oral chemotherapy drug for stage IV metastatic breast cancer.    - IV fluids as ordered for acute kidney injury - Zofran as needed for nausea.  If no relief, could trial Phenergan versus Reglan - Hold home chemotherapy  Acute kidney injury (HCC) Over the last month, patient's creatinine has consistently been elevated between 1.2 and 1.5.  No prior labs for the last 2 years, however this does suggest CKD.  Now with elevated creatinine at 1.96, likely due to intractable nausea/vomiting leading to  dehydration.  - IV fluids - Strict in and out - Repeat BMP in the a.m. - Avoid nephrotoxic agents  Weakness Patient complaining of generalized weakness, likely exacerbated by the initiation of chemotherapy and subsequent nausea/vomiting.  No focal symptoms to suggest central process.  - PT/OT  COVID-19 virus infection Patient is reporting a 1.5-week history of a nonproductive cough and 1 day history of shortness of breath.  Chest x-ray is reassuring, however COVID-19 likely led to a COPD exacerbation.  She is outside of the window for any antivirals but will initiate COPD exacerbation treatment as noted below.  - Supportive management with Tussionex as needed for cough - Tylenol as needed for fever  COPD with acute exacerbation (HCC) Patient reports 1.5-week history of nonproductive cough and 1 day history of shortness of breath.  Notably diminished breath sounds throughout.  - Continuous pulse oximetry - Continue supplemental oxygen to maintain oxygen saturation above 88% - Wean as tolerated - Start prednisone 40 mg to complete a 5-day course - DuoNebs every 6 hours - Continue home bronchodilators  Hypercalcemia Over the last 3 weeks, patient's calcium levels have been gradually increasing, currently at 10.9 corrected to 11.6 given low albumin.  Although certainly elevated, I would not expect this to cause significant symptoms.  - IV fluid resuscitation, if no improvement can consider Zometa  Malignant neoplasm of overlapping sites of left breast in female, estrogen receptor positive (HCC) Patient was recently admitted, at which time metastatic breast cancer was discovered with metastasis to the liver, bone and abdominal lymph nodes. She has established with Oncology and started on Ribociclib+Letrozole 2 weeks ago.   - Continue outpatient follow-up with oncology. - Patient would certainly benefit from palliative consultation given poor baseline functioning, and expected  difficulty with tolerating any future treatment for breast cancer  Elevated LFTs In the setting of hepatic metastasis.  Relatively stable at this time  - Continue to monitor while admitted  Primary hypertension - Resume home antihypertensives tomorrow  Advance Care Planning:   Code Status: Do not attempt resuscitation (DNR) PRE-ARREST INTERVENTIONS DESIRED.  Patient states in the event of pulmonary arrest only, she would want to be considered for intubation  Consults: Oncology  Family Communication: Patient's sister updated at bedside.   Severity of Illness: The appropriate patient status for this patient is OBSERVATION. Observation status is judged to be reasonable and necessary in order  to provide the required intensity of service to ensure the patient's safety. The patient's presenting symptoms, physical exam findings, and initial radiographic and laboratory data in the context of their medical condition is felt to place them at decreased risk for further clinical deterioration. Furthermore, it is anticipated that the patient will be medically stable for discharge from the hospital within 2 midnights of admission.   Author: Verdene Lennert, MD 01/12/2023 5:04 PM  For on call review [email or web address].

## 2023-01-13 DIAGNOSIS — Z825 Family history of asthma and other chronic lower respiratory diseases: Secondary | ICD-10-CM | POA: Diagnosis not present

## 2023-01-13 DIAGNOSIS — E1122 Type 2 diabetes mellitus with diabetic chronic kidney disease: Secondary | ICD-10-CM | POA: Diagnosis present

## 2023-01-13 DIAGNOSIS — C7951 Secondary malignant neoplasm of bone: Secondary | ICD-10-CM | POA: Diagnosis present

## 2023-01-13 DIAGNOSIS — R7989 Other specified abnormal findings of blood chemistry: Secondary | ICD-10-CM | POA: Diagnosis present

## 2023-01-13 DIAGNOSIS — E860 Dehydration: Secondary | ICD-10-CM | POA: Diagnosis present

## 2023-01-13 DIAGNOSIS — E876 Hypokalemia: Secondary | ICD-10-CM | POA: Diagnosis present

## 2023-01-13 DIAGNOSIS — D630 Anemia in neoplastic disease: Secondary | ICD-10-CM | POA: Diagnosis present

## 2023-01-13 DIAGNOSIS — N1831 Chronic kidney disease, stage 3a: Secondary | ICD-10-CM | POA: Diagnosis present

## 2023-01-13 DIAGNOSIS — J441 Chronic obstructive pulmonary disease with (acute) exacerbation: Secondary | ICD-10-CM | POA: Diagnosis present

## 2023-01-13 DIAGNOSIS — Z66 Do not resuscitate: Secondary | ICD-10-CM | POA: Diagnosis present

## 2023-01-13 DIAGNOSIS — E440 Moderate protein-calorie malnutrition: Secondary | ICD-10-CM | POA: Diagnosis present

## 2023-01-13 DIAGNOSIS — C787 Secondary malignant neoplasm of liver and intrahepatic bile duct: Secondary | ICD-10-CM | POA: Diagnosis present

## 2023-01-13 DIAGNOSIS — C50812 Malignant neoplasm of overlapping sites of left female breast: Secondary | ICD-10-CM | POA: Diagnosis present

## 2023-01-13 DIAGNOSIS — N179 Acute kidney failure, unspecified: Secondary | ICD-10-CM | POA: Diagnosis present

## 2023-01-13 DIAGNOSIS — R112 Nausea with vomiting, unspecified: Secondary | ICD-10-CM | POA: Diagnosis present

## 2023-01-13 DIAGNOSIS — Z823 Family history of stroke: Secondary | ICD-10-CM | POA: Diagnosis not present

## 2023-01-13 DIAGNOSIS — Z8673 Personal history of transient ischemic attack (TIA), and cerebral infarction without residual deficits: Secondary | ICD-10-CM | POA: Diagnosis not present

## 2023-01-13 DIAGNOSIS — Z8249 Family history of ischemic heart disease and other diseases of the circulatory system: Secondary | ICD-10-CM | POA: Diagnosis not present

## 2023-01-13 DIAGNOSIS — I129 Hypertensive chronic kidney disease with stage 1 through stage 4 chronic kidney disease, or unspecified chronic kidney disease: Secondary | ICD-10-CM | POA: Diagnosis present

## 2023-01-13 DIAGNOSIS — T451X5A Adverse effect of antineoplastic and immunosuppressive drugs, initial encounter: Secondary | ICD-10-CM | POA: Diagnosis present

## 2023-01-13 DIAGNOSIS — U071 COVID-19: Secondary | ICD-10-CM | POA: Diagnosis present

## 2023-01-13 DIAGNOSIS — Z87891 Personal history of nicotine dependence: Secondary | ICD-10-CM | POA: Diagnosis not present

## 2023-01-13 LAB — COMPREHENSIVE METABOLIC PANEL
ALT: 24 U/L (ref 0–44)
AST: 67 U/L — ABNORMAL HIGH (ref 15–41)
Albumin: 2.5 g/dL — ABNORMAL LOW (ref 3.5–5.0)
Alkaline Phosphatase: 308 U/L — ABNORMAL HIGH (ref 38–126)
Anion gap: 9 (ref 5–15)
BUN: 32 mg/dL — ABNORMAL HIGH (ref 8–23)
CO2: 23 mmol/L (ref 22–32)
Calcium: 10 mg/dL (ref 8.9–10.3)
Chloride: 103 mmol/L (ref 98–111)
Creatinine, Ser: 2.03 mg/dL — ABNORMAL HIGH (ref 0.44–1.00)
GFR, Estimated: 27 mL/min — ABNORMAL LOW (ref >60)
Glucose, Bld: 113 mg/dL — ABNORMAL HIGH (ref 70–99)
Potassium: 4.4 mmol/L (ref 3.5–5.1)
Sodium: 135 mmol/L (ref 135–145)
Total Bilirubin: 1.8 mg/dL — ABNORMAL HIGH (ref 0.3–1.2)
Total Protein: 6.6 g/dL (ref 6.5–8.1)

## 2023-01-13 LAB — IRON AND TIBC
Iron: 35 ug/dL (ref 28–170)
Saturation Ratios: 22 % (ref 10.4–31.8)
TIBC: 157 ug/dL — ABNORMAL LOW (ref 250–450)
UIBC: 122 ug/dL

## 2023-01-13 LAB — CBC
HCT: 23.2 % — ABNORMAL LOW (ref 36.0–46.0)
Hemoglobin: 7.9 g/dL — ABNORMAL LOW (ref 12.0–15.0)
MCH: 27.4 pg (ref 26.0–34.0)
MCHC: 34.1 g/dL (ref 30.0–36.0)
MCV: 80.6 fL (ref 80.0–100.0)
Platelets: 345 10*3/uL (ref 150–400)
RBC: 2.88 MIL/uL — ABNORMAL LOW (ref 3.87–5.11)
RDW: 18.4 % — ABNORMAL HIGH (ref 11.5–15.5)
WBC: 4.1 10*3/uL (ref 4.0–10.5)
nRBC: 0 % (ref 0.0–0.2)

## 2023-01-13 LAB — FERRITIN: Ferritin: 813 ng/mL — ABNORMAL HIGH (ref 11–307)

## 2023-01-13 LAB — LACTATE DEHYDROGENASE: LDH: 167 U/L (ref 98–192)

## 2023-01-13 LAB — PROTIME-INR
INR: 1.2 (ref 0.8–1.2)
Prothrombin Time: 14.9 s (ref 11.4–15.2)

## 2023-01-13 MED ORDER — SODIUM CHLORIDE 0.9 % IV SOLN
30.0000 mg | Freq: Once | INTRAVENOUS | Status: AC
  Administered 2023-01-13: 30 mg via INTRAVENOUS
  Filled 2023-01-13: qty 10

## 2023-01-13 MED ORDER — ADULT MULTIVITAMIN W/MINERALS CH
1.0000 | ORAL_TABLET | Freq: Every day | ORAL | Status: DC
  Administered 2023-01-14 – 2023-01-17 (×4): 1 via ORAL
  Filled 2023-01-13 (×4): qty 1

## 2023-01-13 MED ORDER — ENSURE ENLIVE PO LIQD
237.0000 mL | Freq: Three times a day (TID) | ORAL | Status: DC
  Administered 2023-01-13 – 2023-01-14 (×2): 237 mL via ORAL

## 2023-01-13 MED ORDER — LACTATED RINGERS IV SOLN: INTRAVENOUS | Status: DC

## 2023-01-13 MED ORDER — VITAMIN D (ERGOCALCIFEROL) 1.25 MG (50000 UNIT) PO CAPS
50000.0000 [IU] | ORAL_CAPSULE | ORAL | Status: DC
  Administered 2023-01-14: 50000 [IU] via ORAL
  Filled 2023-01-13: qty 1

## 2023-01-13 NOTE — Progress Notes (Signed)
[facility] Liaison Note:    This patient is currently enrolled in the [facility] outpatient based palliative care program.  Stephanie [name] will continue to follow for discharge disposition.    Please call with any palliative care questions or concerns.    [facility] Liaison [phone]

## 2023-01-13 NOTE — Evaluation (Signed)
Physical Therapy Evaluation Patient Details Name: Stephanie [name] MRN: [id number] DOB: 10/14/1955 Today's Date: 01/13/2023  History of Present Illness  Pt is a 67 y.o. 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  Clinical Impression   Pt received in bed, she is agreeable to PT session. Pt reports generalized pain and fatigue at the beginning of the session. Pt performs bed mobility mod-max A secondary to fatigue. VSS monitored throughout session noted 95% on 2L [place] with exertion. Pt demonstrates generalized weakness with B LE during bed mobility assessed, reporting "I feel weak". Pt able to perform bed exercises to promote BLE strength. Educated with Pt on benefits from rehab with Pt stating interest in being discharge to aunt's home. Pt would benefit from skilled PT to address above deficits and promote optimal return to PLOF.           If plan is discharge home, recommend the following: A lot of help with walking and/or transfers;A lot of help with bathing/dressing/bathroom;Help with stairs or ramp for entrance;Assistance with cooking/housework   Can travel by private vehicle   No    Equipment Recommendations Rolling walker (2 wheels);BSC/3in1;Other (comment) (TBD at this time)  Recommendations for Other Services       Functional Status Assessment Patient has had a recent decline in their functional status and/or demonstrates limited ability to make significant improvements in function in a reasonable and predictable amount of time     Precautions / Restrictions Precautions Precautions: Fall Restrictions Weight Bearing Restrictions: No      Mobility  Bed Mobility Overal bed mobility: Needs Assistance Bed Mobility: Rolling Rolling: Mod assist, Max assist          General bed mobility comments: Attempted to roll x1 with mod-max A but unable to achieve task due to increased level of fatigue    Transfers                   General transfer comment: NT due to increased level of fatigue    Ambulation/Gait               General Gait Details: NT due to increased level of fatigue  Stairs            Wheelchair Mobility     Tilt Bed    Modified Rankin (Stroke Patients Only)       Balance Overall balance assessment: Needs assistance Sitting-balance support: Bilateral upper extremity supported Sitting balance-Leahy Scale: Fair Sitting balance - Comments: Able to maintain seated balance for a short brief period in long sitting but unable to maintain due to fatigue       Standing balance comment: NT due to increased fatigue level                             Pertinent Vitals/Pain Pain Assessment Pain Assessment: 0-10 Pain Score: 5  Pain Location: general body Pain Descriptors / Indicators: Aching, Discomfort Pain Intervention(s): Limited activity within patient's tolerance    Home Living Family/patient expects to be discharged to:: Private residence Living Arrangements: Other relatives (aunt) Available Help at Discharge: Family;Available 24 hours/day Type of Home: House Home Access: Ramped entrance       Home Layout: One level Home Equipment: Grab bars - tub/shower;Cane - single  point;Wheelchair - manual Additional Comments: Pt describing aunts home which she wishes to discharge to- Pt does not want to go back to living with brother in the basement    Prior Function Prior Level of Function : Independent/Modified Independent;History of Falls (last six months)             Mobility Comments: 3 falls in the last 6 months; uses wheelchair ADLs Comments: Active, independent, has been expriencing progressive weakness over the last few weeks.     Extremity/Trunk Assessment   Upper Extremity  Assessment Upper Extremity Assessment: Generalized weakness    Lower Extremity Assessment Lower Extremity Assessment: Generalized weakness (gross BLE MMT: 2/5 with fatigue)       Communication   Communication Communication: No apparent difficulties  Cognition Arousal: Lethargic Behavior During Therapy: WFL for tasks assessed/performed Overall Cognitive Status: Within Functional Limits for tasks assessed                                 General Comments: Slightly tired and fatigued but cooperative for therapy        General Comments      Exercises Total Joint Exercises Ankle Circles/Pumps: AROM, Both, 10 reps Quad Sets: AROM, 10 reps, Both   Assessment/Plan    PT Assessment Patient needs continued PT services  PT Problem List Decreased strength;Decreased mobility;Decreased range of motion;Decreased activity tolerance;Decreased balance;Pain       PT Treatment Interventions DME instruction;Functional mobility training;Therapeutic activities;Therapeutic exercise;Wheelchair mobility training    PT Goals (Current goals can be found in the Care Plan section)  Acute Rehab PT Goals Patient Stated Goal: to go home to aunts house PT Goal Formulation: With patient Time For Goal Achievement: 01/27/23 Potential to Achieve Goals: Fair    Frequency Min 1X/week     Co-evaluation               AM-PAC PT "6 Clicks" Mobility  Outcome Measure Help needed turning from your back to your side while in a flat bed without using bedrails?: A Lot Help needed moving from lying on your back to sitting on the side of a flat bed without using bedrails?: A Lot Help needed moving to and from a bed to a chair (including a wheelchair)?: A Lot Help needed standing up from a chair using your arms (e.g., wheelchair or bedside chair)?: A Lot Help needed to walk in hospital room?: Total Help needed climbing 3-5 steps with a railing? : Total 6 Click Score: 10    End of Session    Activity Tolerance: Patient limited by fatigue Patient left: in bed;with call bell/phone within reach;with chair alarm set Nurse Communication: Mobility status PT Visit Diagnosis: Unsteadiness on feet (R26.81);Repeated falls (R29.6);Muscle weakness (generalized) (M62.81);History of falling (Z91.81);Pain Pain - Right/Left:  (grossly) Pain - part of body: Leg    Time: 2440-1027 PT Time Calculation (min) (ACUTE ONLY): 23 min   Charges:                 Elmon Else, SPT   Milos Milligan 01/13/2023, 11:38 AM

## 2023-01-13 NOTE — TOC Initial Note (Signed)
Transition of Care [facility]) - Initial/Assessment Note    Patient Details  Name: Stephanie [name] MRN: [id number] Date of Birth: 07-31-1955  Transition of Care [facility]) CM/SW Contact:    Allena Katz, LCSW Phone Number: 01/13/2023, 10:38 AM  Clinical Narrative:   Pt admitted from [facility]. CSW spoke with pt and she reports her plan is to return there. TOC following for care plan updates and needs.                    Patient Goals and CMS Choice            Expected Discharge Plan and Services                                              Prior Living Arrangements/Services                       Activities of Daily Living Home Assistive Devices/Equipment: Dan Humphreys (specify type) ADL Screening (condition at time of admission) Patient's cognitive ability adequate to safely complete daily activities?: Yes Is the patient deaf or have difficulty hearing?: No Does the patient have difficulty seeing, even when wearing glasses/contacts?: No Does the patient have difficulty concentrating, remembering, or making decisions?: No Patient able to express need for assistance with ADLs?: Yes Does the patient have difficulty dressing or bathing?: Yes Independently performs ADLs?: No Communication: Independent Dressing (OT): Needs assistance Is this a change from baseline?: Change from baseline, expected to last <3days Grooming: Needs assistance Is this a change from baseline?: Change from baseline, expected to last <3 days Feeding: Independent Bathing: Needs assistance Is this a change from baseline?: Change from baseline, expected to last <3 days In/Out Bed: Dependent Is this a change from baseline?: Change from baseline, expected to last <3 days Walks in Home: Needs assistance Is this a change from baseline?: Change from baseline, expected to last <3 days Does the patient have difficulty walking or climbing stairs?: Yes Weakness of Legs: Both Weakness of  Arms/Hands: None  Permission Sought/Granted                  Emotional Assessment              Admission diagnosis:  Intractable nausea and vomiting [R11.2] Patient Active Problem List   Diagnosis Date Noted   Hypercalcemia 01/12/2023   Malignant neoplasm of overlapping sites of left breast in female, estrogen receptor positive (HCC) 12/27/2022   Palliative care encounter 12/20/2022   Liver mass 12/20/2022   Right leg weakness 12/14/2022   Elevated LFTs 12/14/2022   COPD with acute exacerbation (HCC) 12/14/2022   Prediabetes 12/14/2022   Severe anemia 06/05/2020   Acute kidney injury (HCC) 06/02/2020   Intractable vomiting with nausea 06/01/2020   AKI (acute kidney injury) (HCC) 06/01/2020   COVID-19 virus infection 06/01/2020   Weakness    Primary hypertension    Acute respiratory failure with hypoxia (HCC) 05/19/2020   COVID-19 05/18/2020   Dehydration 05/18/2020   Slurred speech 05/18/2020   CVA (cerebral vascular accident) (HCC) 05/18/2020   PCP:  [facility]:   [facility] [address] [place] [address] Phone: [phone] Fax: [phone]  [facility] [address] [place] [address] Phone: [phone] Fax:  [phone]     Social Determinants of Health (SDOH) Social History: SDOH Screenings   Food Insecurity: No Food Insecurity (01/12/2023)  Housing: Low Risk  (01/12/2023)  Transportation Needs: No Transportation Needs (01/12/2023)  Utilities: Not At Risk (01/12/2023)  Tobacco Use: Medium Risk (01/12/2023)   SDOH Interventions:     Readmission Risk Interventions     No data to display

## 2023-01-13 NOTE — Progress Notes (Signed)
PROGRESS NOTE    Stephanie [name]  WFU:[id number] DOB: 19-Aug-1955 DOA: 01/12/2023 PCP: [facility]  [id number]  LOS: 0 days   Brief hospital course:   Assessment & Plan: Stephanie [name] is a 67 y.o. 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  Stephanie [name] states that since starting the new chemotherapy drug approximately 2 weeks ago, her appetite quickly diminished and she began to experience persistent nausea with vomiting that was nonbloody, nonbilious. Since her p.o. intake has decreased and the vomiting started, she developed diffuse generalized weakness.  Stephanie [name] also endorses a cough that is nonproductive and has been occurring for approximately 1.5 weeks.   * Intractable vomiting with nausea Patient reports 2+ week history of intractable nausea and vomiting since being started on an oral chemotherapy drug for stage IV metastatic breast cancer.   --N/V resolved after presentation. Plan: --cont MIVF@75   Acute kidney injury (HCC) Over the last month, patient's creatinine has consistently been between 1.2 and 1.5.  No prior labs for the last 2 years, however this does suggest CKD.  Now with elevated creatinine at 1.96, likely due to intractable nausea/vomiting leading to dehydration. --cont MIVF@75   Weakness Patient complaining of generalized weakness, likely exacerbated by the initiation of chemotherapy and subsequent nausea/vomiting.  No focal symptoms to suggest central process.  - PT/OT  COVID-19 virus infection Patient is reporting a 1.5-week history of a nonproductive cough and 1 day history of shortness of breath.  Chest x-ray is reassuring.  She is outside of the window for any antivirals.  No documented hypoxia.  COPD with acute  exacerbation (HCC) Patient reports 1.5-week history of nonproductive cough and 1 day history of shortness of breath.  Notably diminished breath sounds throughout.  Started on prednisone on admission Plan: --cont prednisone 40 mg daily --cont Combivent QID  Hypercalcemia Over the last 3 weeks, patient's calcium levels have been gradually increasing, currently at 10.9 corrected to 11.6 given low albumin.  Although certainly elevated, I would not expect this to cause significant symptoms. --cont MIVF@75   Malignant neoplasm of overlapping sites of left breast in female, estrogen receptor positive (HCC) Patient was recently admitted, at which time metastatic breast cancer was discovered with metastasis to the liver, bone and abdominal lymph nodes. She has established with Oncology and started on Ribociclib+Letrozole 2 weeks ago.  --pt currently followed by palliative care as outpatient - Continue outpatient follow-up with oncology.  Elevated LFTs In the setting of hepatic metastasis.  Relatively stable at this time  - Continue to monitor while admitted  Primary hypertension --cont amlodipine and hydralazine  Anemia --anemia workup   DVT prophylaxis: Lovenox SQ Code Status: DNR  Family Communication:  Level of care: Med-Surg Dispo:   The patient is from: SNF Anticipated d/c is to: SNF Anticipated d/c date is: 1-2 days   Subjective and Interval History:  Pt reported feeling better.  Normal oral intake.  Had a little cough.  No N/V/D.   Objective: Vitals:   01/13/23 1117 01/13/23 1424 01/13/23 1453 01/13/23 1615  BP:   122/65 116/68  Pulse:   90 88  Resp:    18  Temp:   98 F (36.7 C) (!) 97.4 F (36.3 C)  TempSrc:    Oral  SpO2: 97%  93% 93%  Weight:  82.7 kg    Height:        Intake/Output Summary (Last 24 hours) at 01/13/2023 1935 Last data filed at 01/13/2023 0300 Gross per 24 hour  Intake --  Output 200 ml  Net -200 ml   Filed Weights   01/12/23 1426  01/13/23 1424  Weight: 81.6 kg 82.7 kg    Examination:   Constitutional: NAD, AAOx3 HEENT: conjunctivae and lids normal, EOMI CV: No cyanosis.   RESP: normal respiratory effort Neuro: II - XII grossly intact.   Psych: Normal mood and affect.  Appropriate judgement and reason   Data Reviewed: I have personally reviewed labs and imaging studies  Time spent: 50 minutes  Darlin Priestly, MD [facility] If 7PM-7AM, please contact night-coverage 01/13/2023, 7:35 PM

## 2023-01-13 NOTE — Evaluation (Signed)
Clinical/Bedside Swallow Evaluation Patient Details  Name: Stephanie [name] MRN: [id number] Date of Birth: 1956/04/14  Today's Date: 01/13/2023 Time: SLP Start Time (ACUTE ONLY): 0930 SLP Stop Time (ACUTE ONLY): 1010 SLP Time Calculation (min) (ACUTE ONLY): 40 min  Past Medical History:  Past Medical History:  Diagnosis Date   Diabetes mellitus without complication (HCC)    Hypertension    Stroke (HCC) 05/17/2020   Past Surgical History:  Past Surgical History:  Procedure Laterality Date   ABDOMINAL HYSTERECTOMY     at 67 years old   CLOSED REDUCTION MANDIBLE Bilateral 05/23/2020   Procedure: CLOSED REDUCTION MANDIBULAR;  Surgeon: Vernie Murders, MD;  Location: [facility] ORS;  Service: ENT;  Laterality: Bilateral;   CLOSED REDUCTION MANDIBLE N/A 06/03/2020   Procedure: CLOSED REDUCTION MANDIBULAR;  Surgeon: Linus Salmons, MD;  Location: [facility] ORS;  Service: ENT;  Laterality: N/A;   COLONOSCOPY WITH PROPOFOL N/A 06/10/2020   Procedure: COLONOSCOPY WITH PROPOFOL;  Surgeon: Pasty Spillers, MD;  Location: [facility] END[facility];  Service: Endoscopy;  Laterality: N/A;   IR US LIVER BIOPSY  12/20/2022   HPI:  Per admitting H&P " Stephanie [name] is a 67 y.o. 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  Since her p.o. intake has decreased and the vomiting started, she developed diffuse generalized weakness.  She states that previously she was able to walk with a walker and bathe herself, but she is unable to do those things at this time.     Stephanie [name] also endorses a cough that  is nonproductive and has been occurring for approximately 1.5 weeks.  Then yesterday, she developed shortness of breath.  She denies any chest pain or palpitations.  "    Assessment / Plan / Recommendation  Clinical Impression  Pt was admitted with nasuea and vomiting but today reports she is able to finally eat. Bedside swallow eval today to assess for risk of aspiration and determine appropriate diet. Oral mech exam revealed structures to be functioning slow but adequate with no unilateral weakness. Pt tolerated all consistencies without overt s/s of aspiration. Oral phase was adequate for all consisenties except solids in whcih Pt needed extended time to chew. She eventually was able to clear al material given sips of water. Pt has several teeth missing and reporteldy wears partials sometimes. Vocal quality remained clear throughout assessment. Rec continue with Dys 2 chopped diet with thin liquids. Meds whole in applesauce. Pt reports she likes the chopped diet and requests her meds whole in applesauce. Prognosis good. ST to follow up with nursing on toleration of diet. SLP Visit Diagnosis: Dysphagia, oral phase (R13.11)    Aspiration Risk  Mild aspiration risk    Diet Recommendation Dysphagia 2 (Fine chop);Thin liquid    Liquid Administration via: Cup;Straw Medication Administration: Whole meds with puree Supervision: Patient able to self feed Compensations: Slow rate;Small sips/bites Postural Changes: Seated upright at 90 degrees;Remain upright for at least 30 minutes after po intake    Other  Recommendations Oral Care Recommendations: Oral care BID    Recommendations for follow up therapy are one component of a multi-disciplinary  discharge planning process, led by the attending physician.  Recommendations may be updated based on patient status, additional functional criteria and insurance authorization.  Follow up Recommendations Follow physician's recommendations for discharge plan and  follow up therapies      Assistance Recommended at Discharge  Supervision  Functional Status Assessment    Frequency and Duration Other (Comment) (f/u with nursing\) X1         Prognosis Prognosis for improved oropharyngeal function: Good      Swallow Study   General Date of Onset: 01/12/23 HPI: Per admitting H&P " Stephanie [name] is a 67 y.o. 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  Since her p.o. intake has decreased and the vomiting started, she developed diffuse generalized weakness.  She states that previously she was able to walk with a walker and bathe herself, but she is unable to do those things at this time.     Stephanie [name] also endorses a cough that is nonproductive and has been occurring for approximately 1.5 weeks.  Then yesterday, she developed shortness of breath.  She denies any chest pain or palpitations.  " Type of Study: Bedside Swallow Evaluation Diet Prior to this Study: Dysphagia 2 (finely chopped) Respiratory Status: Room air History of Recent Intubation: No Behavior/Cognition: Alert;Cooperative;Pleasant mood Oral Cavity Assessment: Within Functional Limits Oral Care Completed by SLP: No Oral Cavity - Dentition: Missing dentition Vision: Functional for self-feeding Self-Feeding Abilities: Needs set up Patient Positioning: Upright in bed Baseline Vocal Quality: Hoarse;Low vocal intensity Volitional Cough: Strong    Oral/Motor/Sensory Function Overall Oral Motor/Sensory Function: Within functional limits   Ice Chips Ice chips: Not  tested   Thin Liquid Thin Liquid: Within functional limits Presentation: Cup;Spoon;Straw    Nectar Thick Nectar Thick Liquid: Not tested   Honey Thick Honey Thick Liquid: Not tested   Puree Puree: Within functional limits Presentation: Spoon   Solid     Solid: Impaired Presentation: Self Fed Oral Phase Impairments: Impaired mastication;Other (comment) (slow oral transit) Oral Phase Functional Implications: Prolonged oral transit;Impaired mastication      Eather Colas 01/13/2023,10:24 AM

## 2023-01-13 NOTE — Progress Notes (Signed)
Initial Nutrition Assessment  DOCUMENTATION CODES:   Non-severe (moderate) malnutrition in context of chronic illness  INTERVENTION:   Ensure Enlive po TID, each supplement provides 350 kcal and 20 grams of protein.  Magic cup TID with meals, each supplement provides 290 kcal and 9 grams of protein  MVI po daily   Ergocalciferol 50,000 units po weekly   Pt at high refeed risk; recommend monitor potassium, magnesium and phosphorus labs daily until stable  Daily weights   NUTRITION DIAGNOSIS:   Moderate Malnutrition related to cancer and cancer related treatments as evidenced by mild fat depletion, mild muscle depletion, moderate muscle depletion.  GOAL:   Patient will meet greater than or equal to 90% of their needs  MONITOR:   PO intake, Supplement acceptance, Labs, Weight trends, Skin, I & O's  REASON FOR ASSESSMENT:   Consult Assessment of nutrition requirement/status  ASSESSMENT:   67 y/o female with medical history significant for newly diagnosed stage IV breast carcinoma with metastasis to the bone, liver & abdominal lymph nodes, type 2 diabetes, hypertension, CVA, chronic hypercalcemia, COPD and Afib who is admitted with COVID 19, AKI and intractable nausea and vomiting after her chemotherapy.  Met with pt in room today. Pt reports decreased appetite and oral intake for 2 weeks pta r/t nausea and vomiting. Pt reports that this happens after her chemo treatments. Pt reports increased weakness over the past two weeks. Per chart, pt is down 6lbs(3%) over the past 2 weeks; this is significant weight loss. Pt with poor dentition; pt was seen by SLP today and placed on a dysphagia 2 diet. Pt reports good appetite and oral intake in hospital; pt eating 100% of meals yesterday and today. Pt reports that she is having trouble with ordering foods that she likes secondary to the dysphagia diet. RD will check with SLP to see if pt's diet can be upgraded. RD discussed with pt the  importance of adequate nutrition needed to preserve lean muscle. Pt is willing to drink chocolate Ensure in hospital. RD will add supplements and vitamins to help pt meet her estimated needs. Of note, pt with severe vitamin D deficiency; RD will order supplementation. Pt is at high refeed risk.   Medications reviewed and include: lovenox, protonix, prednisone, LRS @75ml /hr  Labs reviewed: K 4.4 wnl, BUN 32(H), creat 2.03(H), alk phos 308(H), AST 67(H), tbili 1.8(H) Ergocalciferol 13.0(L)- 8/28 Hgb 7.9(L), Hct 23.2(L)  NUTRITION - FOCUSED PHYSICAL EXAM:  Flowsheet Row Most Recent Value  Orbital Region No depletion  Upper Arm Region Mild depletion  Thoracic and Lumbar Region Mild depletion  Buccal Region No depletion  Temple Region Mild depletion  Clavicle Bone Region Moderate depletion  Clavicle and Acromion Bone Region Moderate depletion  Scapular Bone Region No depletion  Dorsal Hand Mild depletion  Patellar Region No depletion  Anterior Thigh Region No depletion  Posterior Calf Region No depletion  Edema (RD Assessment) Mild  Hair Reviewed  Eyes Reviewed  Mouth Reviewed  Skin Reviewed  Nails Reviewed   Diet Order:   Diet Order             DIET DYS 2 Room service appropriate? Yes; Fluid consistency: Thin  Diet effective now                  EDUCATION NEEDS:   Education needs have been addressed  Skin:  Skin Assessment: Reviewed RN Assessment  Last BM:  8/27  Height:   Ht Readings from Last 1 Encounters:  01/12/23 6' (1.829 m)    Weight:   Wt Readings from Last 1 Encounters:  01/13/23 82.7 kg    Ideal Body Weight:  72.7 kg  BMI:  Body mass index is 24.74 kg/m.  Estimated Nutritional Needs:   Kcal:  2000-2300kcal/day  Protein:  100-115g/day  Fluid:  1.9-2.2L/day  Betsey Holiday MS, RD, LDN Please refer to [facility] for RD and/or RD on-call/weekend/after hours pager

## 2023-01-13 NOTE — Consult Note (Signed)
[facility] CONSULT NOTE  Patient Care Team: Center, [facility] as PCP - General (General Practice) Lorn Junes, FNP (Family Medicine) [place], Carlye Grippe, RN Earna Coder, MD as Consulting Physician (Oncology) Hulen Luster, RN as Oncology Nurse Navigator  CHIEF COMPLAINTS/PURPOSE OF CONSULTATION: Metastatic breast cancer  HISTORY OF PRESENTING ILLNESS:  Stephanie [name] 67 y.o.  female pleasant patient with multiple medical problems including not limited to hypertension stroke chronic right lower extremity weakness-and new diagnosis of left breast cancer metastatic to liver is currently admitted to hospital for worsening nausea vomiting overall feeling poorly.  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  Patient started on Ribociclib plus letrozole about 2 weeks ago.  Patient at the healthcare facility noted to have progressive weakness poor appetite nausea vomiting; and felt dizzy.  Also complained of abdominal pain/back pain.  Patient was seen in the oncology clinic for routine follow-up.  Patient labs showed hypercalcemia of 10.9 acute on chronic renal failure.  Patient also noted to have low-grade fever.  Of note patient was started on oxygen at the facility overnight.  Patient was subsequently admitted to hospital for further evaluation recommendations.  Chest x-ray negative for pneumonia.  However COVID-positive.  Patient has been appropriately started on IV fluids; is also on treatment for her pulmonary COPD exacerbation.  This morning patient feels improved.  However-continues overall weak.  Noted to have a hemoglobin of 7.9 this morning.  Denies any bloody stools or black or stools.   Review of Systems  Constitutional:  Positive for fever, malaise/fatigue and weight loss. Negative for chills and diaphoresis.  HENT:  Negative for nosebleeds and sore throat.    Eyes:  Negative for double vision.  Respiratory:  Negative for cough, hemoptysis, sputum production, shortness of breath and wheezing.   Cardiovascular:  Negative for chest pain, palpitations, orthopnea and leg swelling.  Gastrointestinal:  Positive for abdominal pain, nausea and vomiting. Negative for blood in stool, constipation, diarrhea, heartburn and melena.  Genitourinary:  Negative for dysuria, frequency and urgency.  Musculoskeletal:  Positive for back pain. Negative for joint pain.  Skin: Negative.  Negative for itching and rash.  Neurological:  Positive for dizziness. Negative for tingling, focal weakness, weakness and headaches.  Endo/Heme/Allergies:  Does not bruise/bleed easily.  Psychiatric/Behavioral:  Negative for depression. The patient is not nervous/anxious and does not have insomnia.     MEDICAL HISTORY:  Past Medical History:  Diagnosis Date   Diabetes mellitus without complication (HCC)    Hypertension    Stroke (HCC) 05/17/2020    SURGICAL HISTORY: Past Surgical History:  Procedure Laterality Date   ABDOMINAL HYSTERECTOMY     at 67 years old   CLOSED REDUCTION MANDIBLE Bilateral 05/23/2020   Procedure: CLOSED REDUCTION MANDIBULAR;  Surgeon: Vernie Murders, MD;  Location: [facility] ORS;  Service: ENT;  Laterality: Bilateral;   CLOSED REDUCTION MANDIBLE N/A 06/03/2020   Procedure: CLOSED REDUCTION MANDIBULAR;  Surgeon: Linus Salmons, MD;  Location: [facility] ORS;  Service: ENT;  Laterality: N/A;   COLONOSCOPY WITH PROPOFOL N/A 06/10/2020   Procedure: COLONOSCOPY WITH PROPOFOL;  Surgeon: Pasty Spillers, MD;  Location: [facility] ENDOSCOPY;  Service: Endoscopy;  Laterality: N/A;   IR US LIVER BIOPSY  12/20/2022    SOCIAL HISTORY: Social History   Socioeconomic History   Marital status: Single    Spouse name: Not on file   Number of children: Not on file  Years of education: Not on file   Highest education level: Not on file  Occupational History   Not on file   Tobacco Use   Smoking status: Former    Current packs/day: 0.00    Types: Cigarettes    Quit date: 04/2020    Years since quitting: 2.7   Smokeless tobacco: Never  Vaping Use   Vaping status: Never Used  Substance and Sexual Activity   Alcohol use: Never   Drug use: Never   Sexual activity: Not on file  Other Topics Concern   Not on file  Social History Narrative   Not on file   Social Determinants of Health   Financial Resource Strain: Not on file  Food Insecurity: No Food Insecurity (01/12/2023)   Hunger Vital Sign    Worried About Running Out of Food in the Last Year: Never true    Ran Out of Food in the Last Year: Never true  Transportation Needs: No Transportation Needs (01/12/2023)   PRAPARE - Administrator, Civil Service (Medical): No    Lack of Transportation (Non-Medical): No  Physical Activity: Not on file  Stress: Not on file  Social Connections: Not on file  Intimate Partner Violence: Not At Risk (01/12/2023)   Humiliation, Afraid, Rape, and Kick questionnaire    Fear of Current or Ex-Partner: No    Emotionally Abused: No    Physically Abused: No    Sexually Abused: No    FAMILY HISTORY: Family History  Problem Relation Age of Onset   COPD Mother    Hypertension Mother    Breast cancer Paternal Aunt    Stroke Maternal Grandmother    Hypertension Maternal Grandmother     ALLERGIES:  is allergic to codeine and lisinopril.  MEDICATIONS:  Current Facility-Administered Medications  Medication Dose Route Frequency Provider Last Rate Last Admin   acetaminophen (TYLENOL) tablet 650 mg  650 mg Oral Q6H PRN Verdene Lennert, MD       Or   acetaminophen (TYLENOL) suppository 650 mg  650 mg Rectal Q6H PRN Verdene Lennert, MD       amLODipine (NORVASC) tablet 10 mg  10 mg Oral Daily Verdene Lennert, MD   10 mg at 01/13/23 0834   busPIRone (BUSPAR) tablet 5 mg  5 mg Oral TID Verdene Lennert, MD   5 mg at 01/13/23 2205   enoxaparin (LOVENOX)  injection 40 mg  40 mg Subcutaneous Q24H Hallaji, Sheema M, RPH   40 mg at 01/13/23 2205   feeding supplement (ENSURE ENLIVE / ENSURE PLUS) liquid 237 mL  237 mL Oral TID BM Darlin Priestly, MD   237 mL at 01/13/23 2031   hydrALAZINE (APRESOLINE) tablet 50 mg  50 mg Oral Q8H Verdene Lennert, MD   50 mg at 01/13/23 2205   Ipratropium-Albuterol (COMBIVENT) respimat 1 puff  1 puff Inhalation QID Verdene Lennert, MD   1 puff at 01/13/23 2031   lactated ringers infusion   Intravenous Continuous Darlin Priestly, MD 75 mL/hr at 01/13/23 1819 New Bag at 01/13/23 1819   letrozole [facility]) tablet 2.5 mg  2.5 mg Oral Daily Verdene Lennert, MD   2.5 mg at 01/13/23 1053   [START ON 01/14/2023] multivitamin with minerals tablet 1 tablet  1 tablet Oral Daily Darlin Priestly, MD       ondansetron [facility]) tablet 4 mg  4 mg Oral Q6H PRN Verdene Lennert, MD       Or   ondansetron [facility]) injection 4 mg  4 mg Intravenous Q6H PRN Verdene Lennert, MD       pantoprazole (PROTONIX) EC tablet 40 mg  40 mg Oral BID Verdene Lennert, MD   40 mg at 01/13/23 2205   polyethylene glycol (MIRALAX / GLYCOLAX) packet 17 g  17 g Oral Daily PRN Verdene Lennert, MD       predniSONE (DELTASONE) tablet 40 mg  40 mg Oral Q breakfast Verdene Lennert, MD   40 mg at 01/13/23 0834   sodium chloride flush (NS) 0.9 % injection 3 mL  3 mL Intravenous Q12H Verdene Lennert, MD   3 mL at 01/13/23 2205   [START ON 01/14/2023] Vitamin D (Ergocalciferol) (DRISDOL) 1.25 MG (50000 UNIT) capsule 50,000 Units  50,000 Units Oral Q7 days Darlin Priestly, MD        PHYSICAL EXAMINATION:   Vitals:   01/13/23 2021 01/13/23 2205  BP: 111/64 120/71  Pulse: 89 91  Resp: 18   Temp: 98.3 F (36.8 C)   SpO2: 95%    Filed Weights   01/12/23 1426 01/13/23 1424  Weight: 180 lb (81.6 kg) 182 lb 6.4 oz (82.7 kg)   Patient on 2 L of O2.  Patient has chronic weakness of the right lower extremity.  Physical Exam Vitals and nursing note reviewed.  HENT:     Head: Normocephalic  and atraumatic.     Mouth/Throat:     Pharynx: Oropharynx is clear.  Eyes:     Extraocular Movements: Extraocular movements intact.     Pupils: Pupils are equal, round, and reactive to light.  Cardiovascular:     Rate and Rhythm: Normal rate and regular rhythm.  Pulmonary:     Comments: Decreased breath sounds bilaterally.  Abdominal:     Palpations: Abdomen is soft.  Musculoskeletal:        General: Normal range of motion.     Cervical back: Normal range of motion.  Skin:    General: Skin is warm.  Neurological:     Mental Status: She is alert and oriented to person, place, and time.  Psychiatric:        Behavior: Behavior normal.        Judgment: Judgment normal.     LABORATORY DATA:  I have reviewed the data as listed Lab Results  Component Value Date   WBC 4.1 01/13/2023   HGB 7.9 (L) 01/13/2023   HCT 23.2 (L) 01/13/2023   MCV 80.6 01/13/2023   PLT 345 01/13/2023   Recent Labs    12/19/22 0427 12/20/22 0421 12/21/22 0402 12/21/22 0402 12/28/22 0922 01/12/23 1238 01/12/23 1522 01/13/23 0548  NA 135 137 136  --  136 133* 133* 135  K 4.1 4.2 4.1  --  3.4* 4.0 4.0 4.4  CL 105 104 104  --  105 97* 97* 103  CO2 26 23 24   --  22 24 22 23   GLUCOSE 107* 99 92  --  106* 113* 112* 113*  BUN 39* 37* 34*  --  21 30* 30* 32*  CREATININE 1.58* 1.51* 1.46*   < > 1.25* 1.96* 2.10* 2.03*  CALCIUM 9.8 10.3 9.9  --  10.4* 10.9* 10.5* 10.0  GFRNONAA 36* 38* 39*   < > 48* 28* 26* 27*  PROT 6.3* 6.5 6.5  --  7.5 8.1  --  6.6  ALBUMIN 2.7* 2.9* 2.9*  --  3.2* 3.1*  --  2.5*  AST 88* 119* 305*  --  133* 85*  --  67*  ALT 40 51* 96*  --  50* 32  --  24  ALKPHOS 226* 251* 286*  --  418* 416*  --  308*  BILITOT 0.8 0.8 1.4*  --  1.4* 2.8*  --  1.8*  BILIDIR 0.4* 0.4* 0.4*  --   --   --   --   --   IBILI 0.4 0.4 1.0*  --   --   --   --   --    < > = values in this interval not displayed.    RADIOGRAPHIC STUDIES: I have personally reviewed the radiological images as listed and  agreed with the findings in the report. DG Chest Portable 1 View  Result Date: 01/12/2023 CLINICAL DATA:  Shortness of breath. New chemotherapy drug. Nausea and vomiting. EXAM: PORTABLE CHEST 1 VIEW COMPARISON:  Chest radiographs 12/17/2022, 12/14/2022, 06/01/2020; CT chest 12/18/2022 FINDINGS: Cardiac silhouette and mediastinal contours are within normal limits. Mildly decreased lung volumes. Lucencies within the upper lungs with attenuation of the pulmonary vasculature again indicating emphysematous changes. Mild bibasilar linear scarring is similar to prior. No acute airspace opacity. No pleural effusion or pneumothorax. No acute skeletal abnormality. IMPRESSION: 1. No acute cardiopulmonary process. 2. Chronic emphysematous changes. Electronically Signed   By: Neita Garnet M.D.   On: 01/12/2023 15:44   NM PET Image Initial (PI) Skull Base To Thigh (F-18 FDG)  Result Date: 01/12/2023 CLINICAL DATA:  Initial treatment strategy for metastatic breast cancer. Widespread hepatic abnormalities on MRI suspicious for metastatic disease. Liver biopsy performed 12/20/2022. EXAM: NUCLEAR MEDICINE PET SKULL BASE TO THIGH TECHNIQUE: 10.22 mCi F-18 FDG was injected intravenously. Full-ring PET imaging was performed from the skull base to thigh after the radiotracer. CT data was obtained and used for attenuation correction and anatomic localization. Fasting blood glucose: 83 mg/dl COMPARISON:  Abdominal MRI 12/18/2022, chest CT 12/18/2022 and CTs of the chest, abdomen and pelvis 06/09/2020. FINDINGS: Mediastinal blood pool activity: SUV max 2.2 NECK: Hypermetabolic left supraclavicular nodes measuring up to 9 mm short axis on image 36/4 (SUV max 13.7). No other hypermetabolic cervical lymph nodes. No suspicious activity identified within the pharyngeal mucosal space. Incidental CT findings: none CHEST: In addition to the hypermetabolic left supraclavicular nodes, there are multiple additional hypermetabolic thoracic  lymph nodes. The largest left axillary node measures 1.1 cm short axis on image 51/4 and has an SUV max of 14.8. There are several small hypermetabolic left internal mammary lymph nodes (SUV max 7.7) as well as mildly hypermetabolic pretracheal and right hilar lymph nodes. Right cardiophrenic angle node measuring 1.0 cm on image 76/4 has an SUV max of 10.9. The known large mass superiorly in the left breast is also significantly hypermetabolic. This measures 4.6 x 3.1 cm on image 53/4 and has an SUV max of 15.6. There are several additional smaller hypermetabolic nodules laterally in the left breast. No suspicious right breast activity. Small hypermetabolic nodule anteriorly in the left pleural space measures 3 mm on image 50/6. No hypermetabolic pulmonary activity or suspicious nodularity. Incidental CT findings: Centrilobular and paraseptal emphysema. Mild dependent atelectasis in both lungs. Mild atherosclerosis of the aorta, great vessels and coronary arteries. ABDOMEN/PELVIS: There is extensive hypermetabolic activity throughout the liver consistent with widespread metastatic disease. There is near complete replacement of the normal hepatic parenchyma. Posteriorly in the dome of the right lobe, the metabolic activity has an SUV max of 16.8. There are hypermetabolic lymph nodes in the porta hepatis, largest a portacaval node measuring 1.6 cm  on image 97/4 (SUV max 16.9). Nonspecific low level metabolic activity in both adrenal glands (SUV max 5.3 on the left). The adrenal glands appear grossly unchanged from previous CT 06/09/2020. No abnormal metabolic activity the spleen or pancreas. There is no lower abdominal or pelvic adenopathy. Incidental CT findings: Underlying morphologic changes of cirrhosis, similar to previous studies. Pelvic ascites without hypermetabolic activity. Mild aortic atherosclerosis. SKELETON: Single small hypermetabolic lesion in the mid sternal body (SUV max 4.7). No other evidence of  osseous metastatic disease. Incidental CT findings: none IMPRESSION: 1. Large primary malignancy in the left breast is hypermetabolic. Additional hypermetabolic nodules more laterally in the left breast. 2. Widespread hypermetabolic metastatic disease throughout the liver with multiple nodal metastases in the left axilla, left internal mammary chain, left supraclavicular, mediastinum, right hilum and upper abdomen. 3. Single small hypermetabolic osseous metastasis in the mid sternum. 4. Nonspecific low level metabolic activity in both adrenal glands without morphologic change in the adrenal glands compared with previous CTs. 5. Aortic Atherosclerosis (ICD10-I70.0) and Emphysema (ICD10-J43.9). Electronically Signed   By: Carey Bullocks M.D.   On: 01/12/2023 14:00   IR US LIVER BIOPSY  Result Date: 12/20/2022 INDICATION: Liver masses.  No diagnosis. EXAM: ULTRASOUND GUIDED LIVER MASS BIOPSY COMPARISON:  MRI abdomen 12/18/2022 MEDICATIONS: None ANESTHESIA/SEDATION: Moderate (conscious) sedation was employed during this procedure. A total of Versed 1 mg and Fentanyl 50 mcg was administered intravenously. Moderate Sedation Time: 10 minutes. The patient's level of consciousness and vital signs were monitored continuously by radiology nursing throughout the procedure under my direct supervision. COMPLICATIONS: None immediate. PROCEDURE: Informed written consent was obtained from insufficient wrap after a discussion of the risks, benefits and alternatives to treatment. The patient understands and consents the procedure. A timeout was performed prior to the initiation of the procedure. Ultrasound scanning was performed of the upper abdominal quadrant demonstrates tumor replacement of the LEFT hepatic lobe The LEFT hepatic lobe was selected for biopsy and the procedure was planned. The upper abdominal quadrant was prepped and draped in the usual sterile fashion. The overlying soft tissues were anesthetized with 1%  lidocaine. A 17 gauge, 6.8 cm co-axial needle was advanced into a peripheral aspect of the lesion. This was followed by 4 core biopsies with an 18 gauge core device under direct ultrasound guidance. The coaxial needle tract was embolized with a small amount of Gel-Foam slurry and superficial hemostasis was obtained with manual compression. Post procedural scanning was negative for definitive area of hemorrhage or additional complication. A dressing was placed. The patient tolerated the procedure well without immediate post procedural complication. IMPRESSION: Successful ultrasound guided core needle biopsy of liver mass. [name], MD [facility] Electronically Signed   By: [name] M.D.   On: 12/20/2022 13:26   US Abdomen Limited RUQ (LIVER/GB)  Result Date: 12/20/2022 CLINICAL DATA:  [id number] Liver mass [id number] EXAM: ULTRASOUND ABDOMEN LIMITED RIGHT UPPER QUADRANT COMPARISON:  CT chest, 12/18/2022. MR abdomen, 12/18/2022. US Abdomen, 12/14/2022. FINDINGS: Gallbladder: No gallstones or wall thickening visualized. No sonographic Murphy sign noted by sonographer. Common bile duct: Diameter: 0.5 cm Liver: Nodular contour liver. Heterogeneous hepatic parenchymal echogenicity, with innumerable liver lesions scattered within the RIGHT and LEFT lobes of the liver. Single largest lesion identified within the RIGHT lobe measures up to 2.7 cm. Portal vein is patent on color Doppler imaging with normal direction of blood flow towards the liver. Other: No perihepatic ascites. IMPRESSION: 1. Innumerable lesions, scattered within the liver.  2. No biliary ductal dilatation. Electronically Signed   By: [name] M.D.   On: 12/20/2022 12:10   CT CHEST WO CONTRAST  Result Date: 12/18/2022 CLINICAL DATA:  Evaluate for occult malignancy EXAM: CT CHEST WITHOUT CONTRAST TECHNIQUE: Multidetector CT imaging of the chest was performed following the standard protocol  without IV contrast. RADIATION DOSE REDUCTION: This exam was performed according to the departmental dose-optimization program which includes automated exposure control, adjustment of the mA and/or kV according to patient size and/or use of iterative reconstruction technique. COMPARISON:  06/09/2020 FINDINGS: Cardiovascular: Scattered calcifications are seen in thoracic aorta. There is ectasia of right main pulmonary artery measuring 3.4 cm. Mediastinum/Nodes: There is 1.8 x 1.2 cm lymph node at the right cardiophrenic angle. There are enlarged lymph nodes in left axilla measuring up to 1.4 cm in short axis. There is 4.5 x 3.4 cm soft tissue mass in the upper aspect of left breast. There is skin thickening adjacent to this mass. Lungs/Pleura: Centrilobular emphysema is seen. Small linear patchy infiltrate is seen in right lower lobe. Increased markings are seen in the posterior segment of right upper lobe. In image 59 of series 4, there is 3 mm pleural-based nodule in left upper lobe. In image 50, there is a 2 mm nodule in the lateral aspect of right upper lobe. In image 68, there is a 3 mm nodule inseparable from the posterior aspect of minor fissure. There is no pleural effusion or pneumothorax. Upper Abdomen: Lobulations are seen in the margin of the liver. There are multiple low-density space-occupying lesions suggesting hepatic metastatic disease. There is 2 mm calcific density in the upper pole of left kidney, possibly left renal stone. Musculoskeletal: No focal lytic or sclerotic lesions are seen. IMPRESSION: There is 4.5 x 3.4 cm new soft tissue mass in the upper portion of left breast. This may suggest primary or metastatic malignant neoplasm. There are abnormally enlarged lymph nodes in left axilla suggesting metastatic lymphadenopathy. There is 1.8 x 1.2 cm lymph node at the right cardiophrenic angle suggesting possible metastatic lymphadenopathy. PET-CT and tissue sampling may be considered. There are a  few scattered tiny nodular densities in both lungs measuring up to 3 mm. This finding may suggest incidental granulomas or early pulmonary metastatic disease. There are space-occupying lesions in liver suggesting hepatic metastatic disease. Liver margin is lobulated suggesting possible cirrhosis. There is 2 mm left renal calculus. Electronically Signed   By: Ernie Avena M.D.   On: 12/18/2022 11:29   MR ABDOMEN W WO CONTRAST  Result Date: 12/18/2022 CLINICAL DATA:  Liver lesions, cirrhosis EXAM: MRI ABDOMEN WITHOUT AND WITH CONTRAST TECHNIQUE: Multiplanar multisequence MR imaging of the abdomen was performed both before and after the administration of intravenous contrast. CONTRAST:  7mL GADAVIST GADOBUTROL 1 MMOL/ML IV SOLN COMPARISON:  MR thoracic spine, 12/17/2022, CT chest abdomen pelvis, 06/09/2020 FINDINGS: Lower chest: No acute abnormality. Hepatobiliary: Hepatomegaly, maximum coronal span 23.0 cm. Coarse, nodular contour of the liver. Very extensive, nearly confluent masslike and nodular signal abnormality throughout the liver (series 4, image 17). Although multiphasic contrast enhanced sequences are significantly limited by breath motion artifact, these lesions are mostly isoenhancing to liver parenchyma, however some are hypoenhancing, for example in the anterior right lobe of the liver (series 20, image 16) and some are faintly hyperenhancing, for example in the peripheral inferior right lobe of the liver (series 20, image 36). Given number and confluent character these are difficult to distinctly measure however a relatively solitary index  lesion in the posterior right lobe of the liver, hepatic segment VI, measures 3.2 x 2.8 cm (series 3, image 24). No gallstones, gallbladder wall thickening, or biliary dilatation. Pancreas: Unremarkable. No pancreatic ductal dilatation or surrounding inflammatory changes. Spleen: Normal in size without significant abnormality. Adrenals/Urinary Tract: Adrenal  glands are unremarkable. Simple, benign bilateral renal cortical cysts, for which no further follow-up or characterization is required. Kidneys are otherwise normal, without obvious renal calculi, solid lesion, or hydronephrosis. Stomach/Bowel: Stomach is within normal limits. No evidence of bowel wall thickening, distention, or inflammatory changes. Vascular/Lymphatic: No significant vascular findings are present. No enlarged abdominal lymph nodes. Other: No abdominal wall hernia or abnormality. No ascites. Probable partially imaged left breast mass (series 4, image 1) Musculoskeletal: No acute osseous findings. Diffuse, heterogeneous marrow enhancement (series 19, image 53). IMPRESSION: 1. Hepatomegaly with very extensive, nearly confluent masslike and nodular signal abnormality throughout the liver, most consistent with extensive hepatic metastatic disease. 2. Diffuse, heterogeneous marrow enhancement, highly suspicious for diffuse osseous metastatic disease. 3. Probable partially imaged left breast mass. Constellation of findings is highly suggestive of diffusely metastatic breast malignancy. Consider additional imaging of the chest and correlation with mammography. Electronically Signed   By: Jearld Lesch M.D.   On: 12/18/2022 05:26   ECHOCARDIOGRAM COMPLETE  Result Date: 12/17/2022    ECHOCARDIOGRAM REPORT   Patient Name:   KASHEENA PHALEN Date of Exam: 12/17/2022 Medical Rec #:  [id number]       Height:       72.0 in Accession #:    [id number]      Weight:       163.1 lb Date of Birth:  [birth year]       BSA:          1.954 m Patient Age:    67 years        BP:           120/64 mmHg Patient Gender: F               HR:           106 bpm. Exam Location:  [facility] Procedure: 2D Echo, Cardiac Doppler and Color Doppler Indications:     Dyspnea R06.00  History:         Patient has prior history of Echocardiogram examinations, most                  recent 05/19/2020. Stroke; Risk Factors:Hypertension and                   Diabetes.  Sonographer:     [name] Referring Phys:  [id number] Marcelino Duster Diagnosing Phys: Debbe Odea MD  Sonographer Comments: Suboptimal apical window. IMPRESSIONS  1. Left ventricular ejection fraction, by estimation, is 70 to 75%. The left ventricle has hyperdynamic function. The left ventricle has no regional wall motion abnormalities. There is mild left ventricular hypertrophy. Left ventricular diastolic parameters are consistent with Grade I diastolic dysfunction (impaired relaxation).  2. Right ventricular systolic function is normal. The right ventricular size is normal.  3. The mitral valve is normal in structure. No evidence of mitral valve regurgitation.  4. The aortic valve is grossly normal. Aortic valve regurgitation is not visualized.  5. The inferior vena cava is normal in size with greater than 50% respiratory variability, suggesting right atrial pressure of 3 mmHg. FINDINGS  Left Ventricle: Left ventricular ejection fraction, by estimation, is 70 to 75%. The left ventricle has hyperdynamic function.  The left ventricle has no regional wall motion abnormalities. The left ventricular internal cavity size was normal in size. There is mild left ventricular hypertrophy. Left ventricular diastolic parameters are consistent with Grade I diastolic dysfunction (impaired relaxation). Right Ventricle: The right ventricular size is normal. No increase in right ventricular wall thickness. Right ventricular systolic function is normal. Left Atrium: Left atrial size was normal in size. Right Atrium: Right atrial size was normal in size. Pericardium: There is no evidence of pericardial effusion. Mitral Valve: The mitral valve is normal in structure. No evidence of mitral valve regurgitation. Tricuspid Valve: The tricuspid valve is normal in structure. Tricuspid valve regurgitation is not demonstrated. Aortic Valve: The aortic valve is grossly normal. Aortic valve regurgitation is not visualized.  Aortic valve mean gradient measures 9.0 mmHg. Aortic valve peak gradient measures 16.0 mmHg. Aortic valve area, by VTI measures 3.68 cm. Pulmonic Valve: The pulmonic valve was normal in structure. Pulmonic valve regurgitation is not visualized. Aorta: The aortic root is normal in size and structure. Venous: The inferior vena cava was not well visualized. The inferior vena cava is normal in size with greater than 50% respiratory variability, suggesting right atrial pressure of 3 mmHg. IAS/Shunts: No atrial level shunt detected by color flow Doppler.  LEFT VENTRICLE PLAX 2D LVIDd:         4.60 cm   Diastology LVIDs:         2.80 cm   LV e' medial:    9.57 cm/s LV PW:         1.10 cm   LV E/e' medial:  7.9 LV IVS:        1.20 cm   LV e' lateral:   5.98 cm/s LVOT diam:     2.10 cm   LV E/e' lateral: 12.7 LV SV:         93 LV SV Index:   48 LVOT Area:     3.46 cm  RIGHT VENTRICLE RV S prime:     22.70 cm/s TAPSE (M-mode): 2.6 cm LEFT ATRIUM             Index        RIGHT ATRIUM           Index LA diam:        3.90 cm 2.00 cm/m   RA Area:     16.20 cm LA Vol (A2C):   29.6 ml 15.15 ml/m  RA Volume:   42.50 ml  21.75 ml/m LA Vol (A4C):   45.1 ml 23.08 ml/m LA Biplane Vol: 39.5 ml 20.22 ml/m  AORTIC VALVE AV Area (Vmax):    3.41 cm AV Area (Vmean):   3.07 cm AV Area (VTI):     3.68 cm AV Vmax:           200.00 cm/s AV Vmean:          133.000 cm/s AV VTI:            0.253 m AV Peak Grad:      16.0 mmHg AV Mean Grad:      9.0 mmHg LVOT Vmax:         197.00 cm/s LVOT Vmean:        118.000 cm/s LVOT VTI:          0.269 m LVOT/AV VTI ratio: 1.06  AORTA Ao Root diam: 2.80 cm MITRAL VALVE                TRICUSPID VALVE MV  Area (PHT): 6.32 cm     TR Peak grad:   15.8 mmHg MV Decel Time: 120 msec     TR Vmax:        199.00 cm/s MV E velocity: 76.00 cm/s MV A velocity: 159.00 cm/s  SHUNTS MV E/A ratio:  0.48         Systemic VTI:  0.27 m                             Systemic Diam: 2.10 cm Debbe Odea MD Electronically  signed by Debbe Odea MD Signature Date/Time: 12/17/2022/2:23:47 PM    Final    DG Chest 1 View  Result Date: 12/17/2022 CLINICAL DATA:  Shortness of breath. EXAM: CHEST  1 VIEW COMPARISON:  12/14/2022 FINDINGS: Single-view of the chest demonstrates slightly low lung volumes. No focal airspace disease or consolidation. No overt pulmonary edema. Heart and mediastinum are grossly stable. Patient appears to be mildly rotated on this examination. No acute bone abnormality. IMPRESSION: No active disease. Electronically Signed   By: Richarda Overlie M.D.   On: 12/17/2022 13:28   MR THORACIC SPINE W WO CONTRAST  Result Date: 12/17/2022 CLINICAL DATA:  Initial evaluation for right lower extremity weakness. EXAM: MRI THORACIC WITHOUT AND WITH CONTRAST TECHNIQUE: Multiplanar and multiecho pulse sequences of the thoracic spine were obtained without and with intravenous contrast. CONTRAST:  7mL GADAVIST GADOBUTROL 1 MMOL/ML IV SOLN COMPARISON:  None Available. FINDINGS: Alignment: Trace dextroscoliosis with mild straightening of the normal midthoracic kyphosis. No listhesis. Vertebrae: Vertebral body height maintained without acute or chronic fracture. Bone marrow signal intensity markedly heterogeneous. Few small STIR hyperintense lesions involving the teeth to through T4 vertebral bodies noted, favored to reflect atypical hemangiomata. No other worrisome osseous lesions. No other abnormal marrow edema or enhancement. Cord:  Normal signal and morphology.  No abnormal enhancement. Paraspinal and other soft tissues: Paraspinous soft tissues are within normal limits. Trace layering bilateral pleural effusions noted. Innumerable cystic lesions noted within the partially visualized liver, incompletely assessed on this exam. Disc levels: Ordinary for age multilevel disc desiccation seen throughout the thoracic spine. No significant disc bulge or focal disc herniation. Posterior element hypertrophy noted at T6-7. No significant  spinal stenosis. Foramina remain patent. No neural impingement. IMPRESSION: 1. Normal MRI appearance of the thoracic spinal cord. No findings to explain patient's symptoms identified. 2. Minor thoracic spondylosis for age. No significant stenosis or neural impingement. 3. Innumerable cystic lesions within the partially visualized liver, incompletely assessed on this exam. Again, further evaluation with dedicated MRI of the abdomen, with and without contrast, recommended for further evaluation. Electronically Signed   By: Rise Mu M.D.   On: 12/17/2022 04:40   MR CERVICAL SPINE W WO CONTRAST  Result Date: 12/16/2022 CLINICAL DATA:  Initial evaluation for weakness, hyper reflexia. EXAM: MRI CERVICAL SPINE WITHOUT AND WITH CONTRAST TECHNIQUE: Multiplanar and multiecho pulse sequences of the cervical spine, to include the craniocervical junction and cervicothoracic junction, were obtained without and with intravenous contrast. CONTRAST:  7mL GADAVIST GADOBUTROL 1 MMOL/ML IV SOLN COMPARISON:  None Available. FINDINGS: Alignment: Examination mildly degraded by motion. Straightening of the normal cervical lordosis. No significant listhesis. Vertebrae: Vertebral body height maintained without acute or chronic fracture. Bone marrow signal intensity diffusely heterogeneous. Subcentimeter stir hyperintense lesions involving the T2 and T3 vertebral bodies noted, favored to reflect small atypical hemangiomata noted. No worrisome osseous lesions. No other abnormal marrow edema or enhancement.  Cord: Normal signal and morphology. No convincing cord signal changes seen on this motion degraded exam. No abnormal enhancement. Posterior Fossa, vertebral arteries, paraspinal tissues: Patient's known cavernous ICA aneurysm noted, partially visualized. Craniocervical junction within normal limits. Paraspinous soft tissues within normal limits. Normal flow voids seen within the vertebral arteries bilaterally. Disc levels:  C2-C3: Mild disc bulge. No spinal stenosis. Foramina remain patent. C3-C4: Mild disc bulge with uncovertebral spurring. Flattening and partial effacement of the ventral thecal sac with resultant mild spinal stenosis. Mild left C4 foraminal narrowing. Right neural foramen remains patent. C4-C5: Mild diffuse disc bulge with uncovertebral spurring. Flattening and partial effacement of the ventral thecal sac. Mild cord flattening without cord signal changes. Mild spinal stenosis. Foramina remain patent. C5-C6: Mild disc bulge with uncovertebral spurring. Flattening and partial effacement of the ventral thecal sac with resultant mild spinal stenosis. Foramina remain patent. C6-C7: Degenerative intervertebral disc space narrowing with diffuse disc osteophyte complex. Posterior component flattens and partially effaces the ventral thecal sac. No more than mild spinal stenosis. Mild left C7 foraminal narrowing. Right neural foramen remains patent. C7-T1: Mild degenerative disc space narrowing with diffuse disc bulge and uncovertebral spurring. Mild bilateral facet hypertrophy. Resultant mild spinal stenosis. Mild left C8 foraminal narrowing. Right neural foramen remains patent. IMPRESSION: 1. Normal MRI appearance of the cervical spinal cord. No cord signal changes to suggest myelopathy or other abnormality. 2. Multilevel cervical spondylosis with resultant mild diffuse spinal stenosis at C3-4 through C7-T1. 3. Mild left C4, C7, and C8 foraminal stenosis related to disc bulge and uncovertebral disease. 4. 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 Electronically Signed   By: Rise Mu M.D.   On: 12/16/2022 9:38    # 67 year old female patient with multiple medical problems-including chronic right-sided weakness history of CVA-and recently diagnosed with breast cancer ER/PR positive HER2 negative with metastasis to liver is currently admitted to hospital for  dehydration/nausea vomiting.  # Metastatic breast cancer-with extensive disease burden in the liver; and also hilar/mediastinal neuropathy.  Patient currently on letrozole plus ribociclib for the last 2 weeks.  Hold Ribociclib in the context of acute illness.  # Intractable nausea vomiting-question related to Ribociclib versus others.  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   # Acute on chronic anemia again multifactorial no obvious bleeding noted.  # Acute on chronic renal failure  # Elevated LFTs likely secondary to-bulky disease burden in the liver.  # Acute respiratory failure needing oxygen support-chest x-ray negative COVID positive question related to COPD exacerbation  # DNR/DNI  Recommendations/plan  # Hold Ribociclib given the acute decompensation/illness.  Continue letrozole.  # Agree with IV fluids improved.  However given the malignancy related hypercalcemia I would recommend low-dose of pamidronate 30 mg.  # Check iron studies.  If hemoglobin less than 8 recommend 1 unit of PRBC transfusion.  Thank you Dr.Lai for allowing me to participate in the care of your pleasant patient. Please do not hesitate to contact me with questions or concerns in the interim.  Above plan of care was discussed with patient in detail.  My contact information was given to the patient/family.   # 60 minutes face-to-face with the patient discussing the above plan of care; more than 50% of time spent on prognosis/ natural history; counseling and coordination.    Earna Coder, MD 01/13/2023 10:12 PM

## 2023-01-13 NOTE — Progress Notes (Addendum)
Pt is requesting to keep pure wick in place. Pt states that she can hardly move, she refuses to get up to the bathroom stating " I can't walk," Pt. Educated. I will make MD and management aware.

## 2023-01-13 NOTE — Evaluation (Signed)
Occupational Therapy Evaluation Patient Details Name: Stephanie [name] MRN: [id number] DOB: 20-May-1955 Today's Date: 01/13/2023   History of Present Illness Pt is a 67 y.o. 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   Clinical Impression   Patient presenting with decreased Ind in self care,balance, functional mobility/transfers, endurance, and safety awareness. Patient reports living at home with family and being independent prior to initial admission beginning of this month. New CA dx and discharge from hospital to rehab at [facility] healthcare with issues after having chemo treatments. Pt reports feeling very weak the last few weeks and also positive for covid. Pt is very tearful when she reports needing assistance for hygiene. Pt encouraged to attempt standing with therapist and performs bed mobility with mod A and then stands with mod A for therapist to assist with cleaning buttocks after BM. Purewick removed and staff notified. Pt taking side steps with mod A towards the R to get further up in bed. Sit >supine with mod A for B LEs.   Patient will benefit from acute OT to increase overall independence in the areas of ADLs, functional mobility, and safety awareness in order to safely discharge.      If plan is discharge home, recommend the following: A lot of help with walking and/or transfers;A lot of help with bathing/dressing/bathroom;Assistance with cooking/housework;Assist for transportation;Help with stairs or ramp for entrance    Functional Status Assessment  Patient has had a recent decline in their functional status and demonstrates the ability to make significant improvements in function in a reasonable and predictable amount of time.  Equipment Recommendations  Other  (comment) (defer to next venue of care)       Precautions / Restrictions Precautions Precautions: Fall      Mobility Bed Mobility Overal bed mobility: Needs Assistance Bed Mobility: Supine to Sit, Sit to Supine     Supine to sit: Mod assist Sit to supine: Mod assist        Transfers Overall transfer level: Needs assistance Equipment used: 1 person hand held assist Transfers: Sit to/from Stand Sit to Stand: Mod assist                  Balance Overall balance assessment: Needs assistance Sitting-balance support: Bilateral upper extremity supported Sitting balance-Leahy Scale: Good     Standing balance support: During functional activity, Bilateral upper extremity supported Standing balance-Leahy Scale: Fair                             ADL either performed or assessed with clinical judgement   ADL Overall ADL's : Needs assistance/impaired                                       General ADL Comments: mod A to stand from EOB with total A from therapist for peri hygiene while she stands     Vision Patient Visual Report: No change from baseline              Pertinent Vitals/Pain Pain Assessment Pain Assessment: 0-10 Pain Score: 4  Pain Location: general body Pain Descriptors / Indicators: Aching, Discomfort Pain Intervention(s): Monitored during session, Repositioned  Extremity/Trunk Assessment Upper Extremity Assessment Upper Extremity Assessment: Generalized weakness   Lower Extremity Assessment Lower Extremity Assessment: Generalized weakness       Communication Communication Communication: No apparent difficulties   Cognition Arousal: Alert Behavior During Therapy: WFL for tasks assessed/performed Overall Cognitive Status: Within Functional Limits for tasks assessed                                 General Comments: Pt is pleasant and cooperative throughout                Home Living  Family/patient expects to be discharged to:: Skilled nursing facility Living Arrangements: Other relatives (aunt) Available Help at Discharge: Family;Available 24 hours/day Type of Home: House Home Access: Ramped entrance     Home Layout: One level     Bathroom Shower/Tub: Tub/shower unit         Home Equipment: Grab bars - tub/shower;Cane - single point;Wheelchair - manual          Prior Functioning/Environment Prior Level of Function : Independent/Modified Independent;History of Falls (last six months)               ADLs Comments: Pt was living at home Ind prior to new CA dx this month. She left hospital 8/6 and went to [facility] healthcare for rehab and now with recent new admission to hospital after chemo        OT Problem List: Decreased strength;Decreased activity tolerance;Decreased safety awareness;Impaired balance (sitting and/or standing);Decreased knowledge of use of DME or AE      OT Treatment/Interventions: Self-care/ADL training;Therapeutic exercise;Therapeutic activities;Energy conservation;DME and/or AE instruction;Patient/family education;Balance training    OT Goals(Current goals can be found in the care plan section) Acute Rehab OT Goals Patient Stated Goal: to get stronger and return to PLOF OT Goal Formulation: With patient Time For Goal Achievement: 01/27/23 Potential to Achieve Goals: Fair ADL Goals Pt Will Perform Grooming: with contact guard assist;standing Pt Will Perform Lower Body Dressing: with min assist;sit to/from stand Pt Will Transfer to Toilet: with min assist;ambulating Pt Will Perform Toileting - Clothing Manipulation and hygiene: with min assist;sit to/from stand  OT Frequency: Min 1X/week       AM-PAC OT "6 Clicks" Daily Activity     Outcome Measure Help from another person eating meals?: None Help from another person taking care of personal grooming?: A Little Help from another person toileting, which includes using  toliet, bedpan, or urinal?: A Lot Help from another person bathing (including washing, rinsing, drying)?: A Lot Help from another person to put on and taking off regular upper body clothing?: A Little Help from another person to put on and taking off regular lower body clothing?: A Lot 6 Click Score: 16   End of Session Nurse Communication: Mobility status;Other (comment) (need for new purewick)  Activity Tolerance: Patient tolerated treatment well Patient left: in bed;with call bell/phone within reach;with bed alarm set  OT Visit Diagnosis: Unsteadiness on feet (R26.81);Muscle weakness (generalized) (M62.81)                Time: 1478-2956 OT Time Calculation (min): 22 min Charges:  OT General Charges $OT Visit: 1 Visit OT Evaluation $OT Eval Moderate Complexity: 1 Mod OT Treatments $Self Care/Home Management : 8-22 mins  Jackquline Denmark, MS, OTR/L , CBIS ascom [phone]  01/13/23, 4:28 PM

## 2023-01-14 DIAGNOSIS — R112 Nausea with vomiting, unspecified: Secondary | ICD-10-CM | POA: Diagnosis not present

## 2023-01-14 LAB — CBC
HCT: 23.4 % — ABNORMAL LOW (ref 36.0–46.0)
Hemoglobin: 7.9 g/dL — ABNORMAL LOW (ref 12.0–15.0)
MCH: 27.1 pg (ref 26.0–34.0)
MCHC: 33.8 g/dL (ref 30.0–36.0)
MCV: 80.4 fL (ref 80.0–100.0)
Platelets: 321 10*3/uL (ref 150–400)
RBC: 2.91 MIL/uL — ABNORMAL LOW (ref 3.87–5.11)
RDW: 18.5 % — ABNORMAL HIGH (ref 11.5–15.5)
WBC: 6.5 10*3/uL (ref 4.0–10.5)
nRBC: 0.8 % — ABNORMAL HIGH (ref 0.0–0.2)

## 2023-01-14 LAB — HEMOGLOBIN AND HEMATOCRIT, BLOOD
HCT: 27.8 % — ABNORMAL LOW (ref 36.0–46.0)
Hemoglobin: 9.3 g/dL — ABNORMAL LOW (ref 12.0–15.0)

## 2023-01-14 LAB — BASIC METABOLIC PANEL
Anion gap: 10 (ref 5–15)
BUN: 39 mg/dL — ABNORMAL HIGH (ref 8–23)
CO2: 23 mmol/L (ref 22–32)
Calcium: 9.7 mg/dL (ref 8.9–10.3)
Chloride: 101 mmol/L (ref 98–111)
Creatinine, Ser: 1.92 mg/dL — ABNORMAL HIGH (ref 0.44–1.00)
GFR, Estimated: 28 mL/min — ABNORMAL LOW (ref >60)
Glucose, Bld: 143 mg/dL — ABNORMAL HIGH (ref 70–99)
Potassium: 3.7 mmol/L (ref 3.5–5.1)
Sodium: 134 mmol/L — ABNORMAL LOW (ref 135–145)

## 2023-01-14 LAB — MAGNESIUM: Magnesium: 2.1 mg/dL (ref 1.7–2.4)

## 2023-01-14 LAB — PREPARE RBC (CROSSMATCH)

## 2023-01-14 LAB — FOLATE: Folate: 4.7 ng/mL — ABNORMAL LOW (ref >5.9)

## 2023-01-14 LAB — PHOSPHORUS: Phosphorus: 2.2 mg/dL — ABNORMAL LOW (ref 2.5–4.6)

## 2023-01-14 MED ORDER — SODIUM CHLORIDE 0.9% IV SOLUTION: Freq: Once | INTRAVENOUS | Status: AC

## 2023-01-14 MED ORDER — SODIUM PHOSPHATES 45 MMOLE/15ML IV SOLN
30.0000 mmol | Freq: Once | INTRAVENOUS | Status: AC
  Administered 2023-01-14: 30 mmol via INTRAVENOUS
  Filled 2023-01-14: qty 10

## 2023-01-14 MED ORDER — FOLIC ACID 1 MG PO TABS
1.0000 mg | ORAL_TABLET | Freq: Every day | ORAL | Status: DC
  Administered 2023-01-14 – 2023-01-17 (×4): 1 mg via ORAL
  Filled 2023-01-14 (×4): qty 1

## 2023-01-14 NOTE — TOC Progression Note (Signed)
Transition of Care [facility]) - Progression Note    Patient Details  Name: Stephanie [name] MRN: [id number] Date of Birth: 1955/12/31  [facility] CM/SW Contact  Allena Katz, LCSW Phone Number: 01/14/2023, 1:44 PM  Clinical Narrative:   CSW unable to start auth for [facility] as Kenney Houseman reports she does not have a private room for patient to go to at this time.          Expected Discharge Plan and Services                                               Social Determinants of Health (SDOH) Interventions SDOH Screenings   Food Insecurity: No Food Insecurity (01/12/2023)  Housing: Low Risk  (01/12/2023)  Transportation Needs: No Transportation Needs (01/12/2023)  Utilities: Not At Risk (01/12/2023)  Tobacco Use: Medium Risk (01/12/2023)    Readmission Risk Interventions     No data to display

## 2023-01-14 NOTE — Progress Notes (Signed)
PROGRESS NOTE    Stephanie [name]  ZOX:[id number] DOB: 11-13-1955 DOA: 01/12/2023 PCP: [facility]  [id number]  LOS: 1 day   Brief hospital course:   Assessment & Plan: Stephanie [name] is a 67 y.o. 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  Stephanie [name] states that since starting the new chemotherapy drug approximately 2 weeks ago, her appetite quickly diminished and she began to experience persistent nausea with vomiting that was nonbloody, nonbilious. Since her p.o. intake has decreased and the vomiting started, she developed diffuse generalized weakness.  Stephanie [name] also endorses a cough that is nonproductive and has been occurring for approximately 1.5 weeks.   * Intractable vomiting with nausea, resolved Patient reports 2+ week history of intractable nausea and vomiting since being started on an oral chemotherapy drug for stage IV metastatic breast cancer.   --N/V resolved after presentation. --s/p MIVF --encourage oral hydration  Acute kidney injury (HCC) Over the last month, patient's creatinine has consistently been between 1.2 and 1.5.  No prior labs for the last 2 years, however this does suggest CKD.  Now with elevated creatinine at 1.96, likely due to intractable nausea/vomiting leading to dehydration. --Cr didn't improve much with MIVF --d/c MIVF  Weakness Patient complaining of generalized weakness, likely exacerbated by the initiation of chemotherapy and subsequent nausea/vomiting.  No focal symptoms to suggest central process. - PT/OT  COVID-19 virus infection Patient is reporting a 1.5-week history of a nonproductive cough and 1 day history of shortness of breath.  Chest x-ray is reassuring.  She is outside of the window for any  antivirals.  No documented hypoxia.  COPD with acute exacerbation (HCC) Patient reports 1.5-week history of nonproductive cough and 1 day history of shortness of breath.  Notably diminished breath sounds throughout.  Started on prednisone on admission Plan: --cont prednisone 40 mg daily --cont Combivent QID  Hypercalcemia Over the last 3 weeks, patient's calcium levels have been gradually increasing, currently at 10.9 corrected to 11.6 given low albumin.  Although certainly elevated, I would not expect this to cause significant symptoms. --s/p MIVF and pamidronate x1  Malignant neoplasm of overlapping sites of left breast in female, estrogen receptor positive (HCC) Patient was recently admitted, at which time metastatic breast cancer was discovered with metastasis to the liver, bone and abdominal lymph nodes. She has established with Oncology and started on Ribociclib+Letrozole 2 weeks ago.  --pt currently followed by palliative care as outpatient Plan: --cont Femara --hold Ribociclib  - Continue outpatient follow-up with oncology.  Elevated LFTs In the setting of hepatic metastasis.  Relatively stable at this time  Primary hypertension --cont amlodipine and hydralazine  Acute on chronic anemia  --multifactorial no obvious bleeding noted.  --anemia workup showed folate def Plan: --1u pRBC today to keep Hgb >8 --start folic acid suppl   DVT prophylaxis: Lovenox SQ Code Status: DNR  Family Communication:  Level of care: Med-Surg Dispo:   The patient is from: SNF Anticipated d/c is to: SNF rehab Anticipated d/c date is: whenever bed available   Subjective and Interval History:  Pt reported feeling sleepy.   Objective: Vitals:   01/14/23 1237 01/14/23 1400 01/14/23 1527 01/14/23 1601  BP: 123/68 120/75 121/67 119/71  Pulse: 84  88 90  Resp: 18  18 17   Temp: 98  F (36.7 C)  98 F (36.7 C) 97.8 F (36.6 C)  TempSrc: Oral  Oral   SpO2: 96%  95% 94%  Weight:       Height:        Intake/Output Summary (Last 24 hours) at 01/14/2023 1900 Last data filed at 01/14/2023 1601 Gross per 24 hour  Intake --  Output 800 ml  Net -800 ml   Filed Weights   01/12/23 1426 01/13/23 1424  Weight: 81.6 kg 82.7 kg    Examination:   Constitutional: NAD, AAOx3 HEENT: conjunctivae and lids normal, EOMI CV: No cyanosis.   RESP: normal respiratory effort, on RA Neuro: II - XII grossly intact.   Psych: Normal mood and affect.  Appropriate judgement and reason   Data Reviewed: I have personally reviewed labs and imaging studies  Time spent: 50 minutes  Darlin Priestly, MD [facility] If 7PM-7AM, please contact night-coverage 01/14/2023, 7:00 PM

## 2023-01-15 DIAGNOSIS — R112 Nausea with vomiting, unspecified: Secondary | ICD-10-CM | POA: Diagnosis not present

## 2023-01-15 LAB — CBC
HCT: 25.7 % — ABNORMAL LOW (ref 36.0–46.0)
Hemoglobin: 9 g/dL — ABNORMAL LOW (ref 12.0–15.0)
MCH: 27.7 pg (ref 26.0–34.0)
MCHC: 35 g/dL (ref 30.0–36.0)
MCV: 79.1 fL — ABNORMAL LOW (ref 80.0–100.0)
Platelets: 307 10*3/uL (ref 150–400)
RBC: 3.25 MIL/uL — ABNORMAL LOW (ref 3.87–5.11)
RDW: 17.6 % — ABNORMAL HIGH (ref 11.5–15.5)
WBC: 6 10*3/uL (ref 4.0–10.5)
nRBC: 0.7 % — ABNORMAL HIGH (ref 0.0–0.2)

## 2023-01-15 LAB — TYPE AND SCREEN
ABO/RH(D): O POS
Antibody Screen: NEGATIVE
Unit division: 0

## 2023-01-15 LAB — BPAM RBC
Blood Product Expiration Date: 202409242359
ISSUE DATE / TIME: 202408301215
Unit Type and Rh: 5100

## 2023-01-15 LAB — MAGNESIUM: Magnesium: 2.2 mg/dL (ref 1.7–2.4)

## 2023-01-15 LAB — BASIC METABOLIC PANEL
Anion gap: 10 (ref 5–15)
BUN: 41 mg/dL — ABNORMAL HIGH (ref 8–23)
CO2: 24 mmol/L (ref 22–32)
Calcium: 9.5 mg/dL (ref 8.9–10.3)
Chloride: 103 mmol/L (ref 98–111)
Creatinine, Ser: 1.85 mg/dL — ABNORMAL HIGH (ref 0.44–1.00)
GFR, Estimated: 30 mL/min — ABNORMAL LOW (ref >60)
Glucose, Bld: 104 mg/dL — ABNORMAL HIGH (ref 70–99)
Potassium: 3.4 mmol/L — ABNORMAL LOW (ref 3.5–5.1)
Sodium: 137 mmol/L (ref 135–145)

## 2023-01-15 LAB — HAPTOGLOBIN: Haptoglobin: 287 mg/dL (ref 37–355)

## 2023-01-15 LAB — PHOSPHORUS: Phosphorus: 2.9 mg/dL (ref 2.5–4.6)

## 2023-01-15 MED ORDER — POTASSIUM CHLORIDE CRYS ER 20 MEQ PO TBCR
40.0000 meq | EXTENDED_RELEASE_TABLET | Freq: Once | ORAL | Status: AC
  Administered 2023-01-15: 40 meq via ORAL
  Filled 2023-01-15: qty 2

## 2023-01-15 MED ORDER — ENSURE ENLIVE PO LIQD: 237.0000 mL | ORAL | Status: DC

## 2023-01-15 NOTE — Progress Notes (Addendum)
PROGRESS NOTE    Stephanie [name]  NFA:[id number] DOB: 1956/03/23 DOA: 01/12/2023 PCP: [facility]  118A/118A-AA  LOS: 2 days   Brief hospital course:   Assessment & Plan: Stephanie [name] is a 67 y.o. 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  Stephanie [name] states that since starting the new chemotherapy drug approximately 2 weeks ago, her appetite quickly diminished and she began to experience persistent nausea with vomiting that was nonbloody, nonbilious. Since her p.o. intake has decreased and the vomiting started, she developed diffuse generalized weakness.  Stephanie [name] also endorses a cough that is nonproductive and has been occurring for approximately 1.5 weeks.   * Intractable vomiting with nausea, resolved Patient reports 2+ week history of intractable nausea and vomiting since being started on an oral chemotherapy drug for stage IV metastatic breast cancer.   --N/V resolved after presentation. --s/p MIVF --encourage oral hydration  Acute kidney injury (HCC) CKD 3a Over the last month, patient's creatinine has consistently been between 1.2 and 1.5.  No prior labs for the last 2 years, however this does suggest CKD.  Now with elevated creatinine at 1.96, likely due to intractable nausea/vomiting leading to dehydration. --Cr didn't improve much with MIVF --d/c'ed MIVF  Weakness Patient complaining of generalized weakness, likely exacerbated by the initiation of chemotherapy and subsequent nausea/vomiting.  No focal symptoms to suggest central process. - Stephanie [name]/OT, back to SNF with rehab  COVID-19 virus infection Patient is reporting a 1.5-week history of a nonproductive cough and 1 day history of shortness of breath.  Chest x-ray is reassuring.  She is  outside of the window for any antivirals.  No documented hypoxia.  COPD with acute exacerbation (HCC) Patient reports 1.5-week history of nonproductive cough and 1 day history of shortness of breath.  Notably diminished breath sounds throughout.  Started on prednisone on admission Plan: --cont prednisone 40 mg daily --cont Combivent QID  Hypercalcemia Over the last 3 weeks, patient's calcium levels have been gradually increasing, currently at 10.9 corrected to 11.6 given low albumin.  Although certainly elevated, I would not expect this to cause significant symptoms. --s/p MIVF and pamidronate x1  Malignant neoplasm of overlapping sites of left breast in female, estrogen receptor positive (HCC) Patient was recently admitted, at which time metastatic breast cancer was discovered with metastasis to the liver, bone and abdominal lymph nodes. She has established with Oncology and started on Ribociclib+Letrozole 2 weeks ago.  --Stephanie [name] currently followed by palliative care as outpatient Plan: --cont Femara --hold Ribociclib  - Continue outpatient follow-up with oncology.  Elevated LFTs In the setting of hepatic metastasis.  Relatively stable at this time  Primary hypertension --cont amlodipine and hydralazine  Acute on chronic anemia  --multifactorial no obvious bleeding noted.  --anemia workup showed folate def --s/p 1u pRBC  Plan: --cont folic acid suppl (new)   DVT prophylaxis: Lovenox SQ Code Status: DNR  Family Communication: daughter updated at bedside today Level of care: Med-Surg Dispo:   The patient is from: SNF Anticipated d/c is to: SNF rehab Anticipated d/c date is: whenever bed available   Subjective and Interval History:  Stephanie [name] reported diarrhea from Ensure, but still wants it, just a small cup at a time.  No other complaint.   Objective: Vitals:   01/15/23 0500 01/15/23 0546  01/15/23 0746 01/15/23 1559  BP:  122/71 120/64 119/65  Pulse:  90 93 95  Resp:  20 18  20   Temp:  98.6 F (37 C) 98.1 F (36.7 C) 98 F (36.7 C)  TempSrc:  Oral    SpO2:  94% 94% 96%  Weight: 82.6 kg     Height:        Intake/Output Summary (Last 24 hours) at 01/15/2023 1947 Last data filed at 01/14/2023 2156 Gross per 24 hour  Intake 3 ml  Output --  Net 3 ml   Filed Weights   01/12/23 1426 01/13/23 1424 01/15/23 0500  Weight: 81.6 kg 82.7 kg 82.6 kg    Examination:   Constitutional: NAD, AAOx3 HEENT: conjunctivae and lids normal, EOMI CV: No cyanosis.   RESP: normal respiratory effort, on RA Neuro: II - XII grossly intact.   Psych: Normal mood and affect.  Appropriate judgement and reason   Data Reviewed: I have personally reviewed labs and imaging studies  Time spent: 35 minutes  Darlin Priestly, MD [facility] If 7PM-7AM, please contact night-coverage 01/15/2023, 7:47 PM

## 2023-01-15 NOTE — NC FL2 (Signed)
[place] MEDICAID FL2 LEVEL OF CARE FORM     IDENTIFICATION  Patient Name: Stephanie [name] Birthdate: [birth year] Sex: female Admission Date (Current Location): 01/12/2023  [facility] and [place] Number:  Chiropodist and Address:  [facility], [address] [place], [place] [address]      Provider Number: [id number]  Attending Physician Name and Address:  Darlin Priestly, MD  Relative Name and Phone Number:  Blanche East (Relative)  [phone] (    Current Level of Care: Hospital Recommended Level of Care:   Prior Approval Number:    Date Approved/Denied:   PASRR Number: [id number]  Discharge Plan: SNF    Current Diagnoses: Patient Active Problem List   Diagnosis Date Noted   Malnutrition of moderate degree 01/13/2023   Hypercalcemia 01/12/2023   Malignant neoplasm of overlapping sites of left breast in female, estrogen receptor positive (HCC) 12/27/2022   Palliative care encounter 12/20/2022   Liver mass 12/20/2022   Right leg weakness 12/14/2022   Elevated LFTs 12/14/2022   COPD with acute exacerbation (HCC) 12/14/2022   Prediabetes 12/14/2022   Severe anemia 06/05/2020   Acute kidney injury (HCC) 06/02/2020   Intractable vomiting with nausea 06/01/2020   AKI (acute kidney injury) (HCC) 06/01/2020   COVID-19 virus infection 06/01/2020   Weakness    Primary hypertension    Acute respiratory failure with hypoxia (HCC) 05/19/2020   COVID-19 05/18/2020   Dehydration 05/18/2020   Slurred speech 05/18/2020   CVA (cerebral vascular accident) (HCC) 05/18/2020    Orientation RESPIRATION BLADDER Height & Weight     Self, Time, Situation, Place  Normal Continent, Incontinent Weight: 182 lb 1.6 oz (82.6 kg) Height:  6' (182.9 cm)  BEHAVIORAL SYMPTOMS/MOOD NEUROLOGICAL BOWEL NUTRITION STATUS      Incontinent    AMBULATORY STATUS COMMUNICATION OF NEEDS Skin   Extensive Assist Verbally Normal                       Personal  Care Assistance Level of Assistance  Bathing, Dressing, Feeding Bathing Assistance: Maximum assistance Feeding assistance: Limited assistance Dressing Assistance: Maximum assistance     Functional Limitations Info  Sight, Hearing, Speech Sight Info: Adequate Hearing Info: Adequate Speech Info: Adequate    SPECIAL CARE FACTORS FREQUENCY                       Contractures Contractures Info: Not present    Additional Factors Info  Code Status Code Status Info: DNR             Current Medications (01/15/2023):  This is the current hospital active medication list Current Facility-Administered Medications  Medication Dose Route Frequency Provider Last Rate Last Admin   acetaminophen (TYLENOL) tablet 650 mg  650 mg Oral Q6H PRN Verdene Lennert, MD       Or   acetaminophen (TYLENOL) suppository 650 mg  650 mg Rectal Q6H PRN Verdene Lennert, MD       amLODipine (NORVASC) tablet 10 mg  10 mg Oral Daily Verdene Lennert, MD   10 mg at 01/15/23 0952   busPIRone (BUSPAR) tablet 5 mg  5 mg Oral TID Verdene Lennert, MD   5 mg at 01/15/23 0952   enoxaparin (LOVENOX) injection 40 mg  40 mg Subcutaneous Q24H Hallaji, Sheema M, RPH   40 mg at 01/14/23 2158   [START ON 01/16/2023] feeding supplement (ENSURE ENLIVE / ENSURE PLUS) liquid 237 mL  237 mL Oral  Q24H Darlin Priestly, MD       folic acid (FOLVITE) tablet 1 mg  1 mg Oral Daily Darlin Priestly, MD   1 mg at 01/15/23 8119   hydrALAZINE (APRESOLINE) tablet 50 mg  50 mg Oral Q8H Verdene Lennert, MD   50 mg at 01/15/23 1300   Ipratropium-Albuterol (COMBIVENT) respimat 1 puff  1 puff Inhalation QID Verdene Lennert, MD   1 puff at 01/15/23 1310   letrozole [facility]) tablet 2.5 mg  2.5 mg Oral Daily Verdene Lennert, MD   2.5 mg at 01/15/23 1478   multivitamin with minerals tablet 1 tablet  1 tablet Oral Daily Darlin Priestly, MD   1 tablet at 01/15/23 0952   ondansetron (ZOFRAN) tablet 4 mg  4 mg Oral Q6H PRN Verdene Lennert, MD       Or   ondansetron  (ZOFRAN) injection 4 mg  4 mg Intravenous Q6H PRN Verdene Lennert, MD       pantoprazole (PROTONIX) EC tablet 40 mg  40 mg Oral BID Verdene Lennert, MD   40 mg at 01/15/23 2956   polyethylene glycol (MIRALAX / GLYCOLAX) packet 17 g  17 g Oral Daily PRN Verdene Lennert, MD       predniSONE (DELTASONE) tablet 40 mg  40 mg Oral Q breakfast Verdene Lennert, MD   40 mg at 01/15/23 2130   sodium chloride flush (NS) 0.9 % injection 3 mL  3 mL Intravenous Q12H Verdene Lennert, MD   3 mL at 01/15/23 8657   Vitamin D (Ergocalciferol) (DRISDOL) 1.25 MG (50000 UNIT) capsule 50,000 Units  50,000 Units Oral Q7 days Darlin Priestly, MD   50,000 Units at 01/14/23 8469     Discharge Medications: Please see discharge summary for a list of discharge medications.  Relevant Imaging Results:  Relevant Lab Results:   Additional Information SS# [id number]  Allena Katz, LCSW

## 2023-01-15 NOTE — Plan of Care (Signed)

## 2023-01-15 NOTE — TOC Progression Note (Signed)
Transition of Care [facility]) - Progression Note    Patient Details  Name: Stephanie [name] MRN: [id number] Date of Birth: [birth year]  Transition of [facility]) CM/SW Contact  Allena Katz, LCSW Phone Number: 01/15/2023, 4:24 PM  Clinical Narrative:   Berkley Harvey started for [facility] for Monday.         Expected Discharge Plan and Services                                               Social Determinants of Health (SDOH) Interventions SDOH Screenings   Food Insecurity: No Food Insecurity (01/12/2023)  Housing: Low Risk  (01/12/2023)  Transportation Needs: No Transportation Needs (01/12/2023)  Utilities: Not At Risk (01/12/2023)  Tobacco Use: Medium Risk (01/12/2023)    Readmission Risk Interventions     No data to display

## 2023-01-15 NOTE — Progress Notes (Signed)
Speech Language Pathology Treatment: Dysphagia  Patient Details Name: Stephanie [name] MRN: [id number] DOB: 1956/05/15 Today's Date: 01/15/2023 Time: 8657-8469 SLP Time Calculation (min) (ACUTE ONLY): 30 min  Assessment / Plan / Recommendation Clinical Impression  Pt seen for ongoing assessment of swallowing, toleration of diet. Pt was alert, verbally responsive and engaged in conversation w/ SLP and NSG in room for meds. Pt is on RA; wbc wnl.  Pt explained general aspiration precautions and agreed verbally to the need for following them especially sitting upright for all oral intake and clearing mouth orally b/t bites of foods. Assisted pt in sitting up for full upright sitting. She continued to feed herself breakfast meal of soft solids and thin liquids via cup. No overt clinical s/s of aspiration were noted w/ any consistency; respiratory status remained calm and unlabored, vocal quality clear b/t trials. Pt was also given Pills Whole in Puree (w/ nsg present) which she tolerated well; the puree providing cohesion for swallowing tablets. Oral phase appeared grossly [facility] for bolus management and timely A-P transfer for swallowing; min increased mastication time noted w/ the soft solid textures but pt stated she was "fine". Highlighted the fact that she was spending time on chewing the foods and NOT to put Large bites of food in her mouth at one time; also encouraged her to alternate foods and liquids to aid oral clearing. Oral clearing was achieved w/ all bites and encouraged b/f she attempted to swallow pills. NSG denied any deficits in swallowing noted.   Pt appears at reduced risk for aspriation when following general aspiration precautions. Recommend continue a  more mech soft diet for ease of soft foods for chewing w/ gravies added to moisten foods; Thin liquids. Pt has requested "regular" foods vs the chopped/minced foods per Dietician(Dietician and MD upgraded the diet per pt's request this week).  Recommend general aspiration precautions; Pills Whole in Puree; tray setup and positioning assistance for meals. REFLUX precautions. ST services will sign off at this time w/ MD to reconsult if needed while admitted. NSG updated. Precautions posted at bedside.        HPI HPI: Per admitting H&P " Stephanie [name] is a 67 y.o. 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  Since her p.o. intake has decreased and the vomiting started, she developed diffuse generalized weakness.  She states that previously she was able to walk with a walker and bathe herself, but she is unable to do those things at this time.     Stephanie [name] also endorses a cough that is nonproductive and has been occurring for approximately 1.5 weeks.  Then yesterday, she developed shortness of breath.  She denies any chest pain or palpitations.  "      SLP Plan  All goals met      Recommendations for follow up therapy are one component of a multi-disciplinary discharge planning process, led by the attending physician.  Recommendations may be updated based on patient status, additional functional criteria and insurance authorization.    Recommendations  Diet recommendations: Dysphagia 3 (mechanical soft);Thin liquid Liquids provided via: Cup Medication Administration:  Whole meds with puree Supervision: Patient able to self feed (setup) Compensations: Minimize environmental distractions;Slow rate;Small sips/bites;Lingual sweep for clearance of pocketing;Follow solids with liquid Postural Changes and/or  Swallow Maneuvers: Out of bed for meals;Seated upright 90 degrees;Upright 30-60 min after meal (reflux)                 (Dietician f/u) Oral care BID;Oral care before and after PO;Patient independent with oral care   Set up Supervision/Assistance Dysphagia, oral phase (R13.11)     All goals met      Stephanie Som, MS, CCC-SLP Speech Language Pathologist Rehab Services; [facility] [phone] (ascom) Stephanie [name]  01/15/2023, 11:20 AM

## 2023-01-16 DIAGNOSIS — R112 Nausea with vomiting, unspecified: Secondary | ICD-10-CM | POA: Diagnosis not present

## 2023-01-16 LAB — CBC
HCT: 27 % — ABNORMAL LOW (ref 36.0–46.0)
Hemoglobin: 9.4 g/dL — ABNORMAL LOW (ref 12.0–15.0)
MCH: 27.6 pg (ref 26.0–34.0)
MCHC: 34.8 g/dL (ref 30.0–36.0)
MCV: 79.4 fL — ABNORMAL LOW (ref 80.0–100.0)
Platelets: 277 10*3/uL (ref 150–400)
RBC: 3.4 MIL/uL — ABNORMAL LOW (ref 3.87–5.11)
RDW: 18.5 % — ABNORMAL HIGH (ref 11.5–15.5)
WBC: 5.8 10*3/uL (ref 4.0–10.5)
nRBC: 0.5 % — ABNORMAL HIGH (ref 0.0–0.2)

## 2023-01-16 LAB — BASIC METABOLIC PANEL
Anion gap: 8 (ref 5–15)
BUN: 42 mg/dL — ABNORMAL HIGH (ref 8–23)
CO2: 22 mmol/L (ref 22–32)
Calcium: 9.2 mg/dL (ref 8.9–10.3)
Chloride: 107 mmol/L (ref 98–111)
Creatinine, Ser: 1.63 mg/dL — ABNORMAL HIGH (ref 0.44–1.00)
GFR, Estimated: 35 mL/min — ABNORMAL LOW (ref >60)
Glucose, Bld: 93 mg/dL (ref 70–99)
Potassium: 3.9 mmol/L (ref 3.5–5.1)
Sodium: 137 mmol/L (ref 135–145)

## 2023-01-16 LAB — PHOSPHORUS: Phosphorus: 2.2 mg/dL — ABNORMAL LOW (ref 2.5–4.6)

## 2023-01-16 LAB — MAGNESIUM: Magnesium: 2.5 mg/dL — ABNORMAL HIGH (ref 1.7–2.4)

## 2023-01-16 NOTE — TOC Progression Note (Signed)
Transition of Care [facility]) - Progression Note    Patient Details  Name: Stephanie [name] MRN: [id number] Date of Birth: 1955/05/29  Transition of Care [facility]) CM/SW Contact  Kemper Durie, RN Phone Number: 01/16/2023, 3:06 PM  Clinical Narrative:     Berkley Harvey remains pending       Expected Discharge Plan and Services                                               Social Determinants of Health (SDOH) Interventions SDOH Screenings   Food Insecurity: No Food Insecurity (01/12/2023)  Housing: Low Risk  (01/12/2023)  Transportation Needs: No Transportation Needs (01/12/2023)  Utilities: Not At Risk (01/12/2023)  Tobacco Use: Medium Risk (01/12/2023)    Readmission Risk Interventions     No data to display

## 2023-01-16 NOTE — Progress Notes (Signed)
Physical Therapy Treatment Patient Details Name: Stephanie [name] MRN: [id number] DOB: [birth year] Today's Date: 01/16/2023   History of Present Illness Pt is a 67 y.o. 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    PT Comments  Patient is a pleasant 67 year old female who is in bed upon PT arrival. She is agreeable to participate in therapy once she is given ice water. Patient performed supine strengthening exercises and educated on need to perform between PT sessions. She transitioned with assistance to EOB and is able to maintain stabilization on EOB with LE strengthening exercises. She then tolerates stabilization training EOB to have her back cleaned as well as to be able to brush teeth with assistance for set up. She is very fatigued and declines standing transfer requesting to return to supine. She is returned to supine position with assistance. Nursing entered room as physical therapy session was ending. All needs met at this time. Current POC remains appropriate at this time.     If plan is discharge home, recommend the following: A lot of help with walking and/or transfers;A lot of help with bathing/dressing/bathroom;Help with stairs or ramp for entrance;Assistance with cooking/housework   Can travel by private vehicle     No  Equipment Recommendations  Rolling walker (2 wheels);BSC/3in1;Other (comment)    Recommendations for Other Services       Precautions / Restrictions Precautions Precautions: Fall Restrictions Weight Bearing Restrictions: No     Mobility  Bed Mobility Overal bed mobility: Needs Assistance Bed Mobility: Supine to Sit, Sit to Supine     Supine to sit: Min assist Sit to supine: Mod assist   General bed mobility comments: Patient requires  assistance for LE's for transition to EOB. Is fatigued and requires Mod A back to supine position    Transfers                   General transfer comment: NT due to increased level of fatigue    Ambulation/Gait               General Gait Details: NT due to increased level of fatigue   Stairs             Wheelchair Mobility     Tilt Bed    Modified Rankin (Stroke Patients Only)       Balance Overall balance assessment: Needs assistance Sitting-balance support: Bilateral upper extremity supported Sitting balance-Leahy Scale: Fair Sitting balance - Comments: Able to maintain for a short period seated position but unable to maintain for prolonged periods due to fatigue       Standing balance comment: NT due to increased fatigue level                            Cognition Arousal: Alert Behavior During Therapy: WFL for tasks assessed/performed Overall Cognitive Status: Within Functional Limits for tasks assessed                                 General Comments: Pt is pleasant but fatigued        Exercises General Exercises - Lower Extremity Ankle Circles/Pumps: Strengthening, Both, 10 reps, Supine Gluteal Sets: Strengthening, Both, 10 reps,  Supine Long Arc Quad: Strengthening, Both, 15 reps, Seated Heel Slides: Strengthening, Both, 10 reps, Supine Hip Flexion/Marching: Strengthening, Both, 10 reps, Seated Other Exercises Other Exercises: Patient tolerated seated stabilization tasks; able to maintain seated balance while PT assist in washing back, able to brush teeth in seated position EOB.    General Comments        Pertinent Vitals/Pain Pain Assessment Pain Assessment: No/denies pain    Home Living                          Prior Function            PT Goals (current goals can now be found in the care plan section) Acute Rehab PT Goals Patient Stated Goal: to go home to aunts house PT Goal  Formulation: With patient Time For Goal Achievement: 01/27/23 Potential to Achieve Goals: Fair Progress towards PT goals: Progressing toward goals    Frequency    Min 1X/week      PT Plan      Co-evaluation              AM-PAC PT "6 Clicks" Mobility   Outcome Measure  Help needed turning from your back to your side while in a flat bed without using bedrails?: A Lot Help needed moving from lying on your back to sitting on the side of a flat bed without using bedrails?: A Lot Help needed moving to and from a bed to a chair (including a wheelchair)?: A Lot Help needed standing up from a chair using your arms (e.g., wheelchair or bedside chair)?: A Lot Help needed to walk in hospital room?: Total Help needed climbing 3-5 steps with a railing? : Total 6 Click Score: 10    End of Session Equipment Utilized During Treatment: Gait belt Activity Tolerance: Patient limited by fatigue Patient left: in bed;with call bell/phone within reach;with chair alarm set;with nursing/sitter in room Nurse Communication: Mobility status PT Visit Diagnosis: Unsteadiness on feet (R26.81);Repeated falls (R29.6);Muscle weakness (generalized) (M62.81);History of falling (Z91.81);Pain     Time: 2595-6387 PT Time Calculation (min) (ACUTE ONLY): 23 min  Charges:    $Therapeutic Exercise: 8-22 mins $Therapeutic Activity: 8-22 mins PT General Charges $$ ACUTE PT VISIT: 1 Visit                       Precious Bard, PT, DPT Physical Therapist - [facility]   01/16/2023, 10:26 AM

## 2023-01-16 NOTE — Plan of Care (Signed)

## 2023-01-16 NOTE — Progress Notes (Signed)
PROGRESS NOTE    Stephanie [name]  OZH:[id number] DOB: [birth year] DOA: 01/12/2023 PCP: [facility]  [id number]/118A-AA  LOS: 3 days   Brief hospital course:   Assessment & Plan: Stephanie [name] is a 67 y.o. 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  Stephanie [name] states that since starting the new chemotherapy drug approximately 2 weeks ago, her appetite quickly diminished and she began to experience persistent nausea with vomiting that was nonbloody, nonbilious. Since her p.o. intake has decreased and the vomiting started, she developed diffuse generalized weakness.  Stephanie [name] also endorses a cough that is nonproductive and has been occurring for approximately 1.5 weeks.   * Intractable vomiting with nausea, resolved Patient reports 2+ week history of intractable nausea and vomiting since being started on an oral chemotherapy drug for stage IV metastatic breast cancer.   --N/V resolved after presentation. --s/p MIVF --encourage oral hydration  Acute kidney injury (HCC) CKD 3a Over the last month, patient's creatinine has consistently been between 1.2 and 1.5.  No prior labs for the last 2 years, however this does suggest CKD.  Now with elevated creatinine at 1.96, likely due to intractable nausea/vomiting leading to dehydration. --Cr didn't improve much with MIVF, but did improve during hospitalization  Weakness Patient complaining of generalized weakness, likely exacerbated by the initiation of chemotherapy and subsequent nausea/vomiting.  No focal symptoms to suggest central process. - PT/OT, back to SNF with rehab  COVID-19 virus infection Patient is reporting a 1.5-week history of a nonproductive cough and 1 day history of shortness of breath.  Chest x-ray  is reassuring.  She is outside of the window for any antivirals.  No documented hypoxia.  COPD with acute exacerbation (HCC) Patient reports 1.5-week history of nonproductive cough and 1 day history of shortness of breath.  Notably diminished breath sounds throughout.  Started on prednisone on admission Plan: --cont prednisone 40 mg daily, for 5-day course --cont Combivent QID  Hypercalcemia Over the last 3 weeks, patient's calcium levels have been gradually increasing, currently at 10.9 corrected to 11.6 given low albumin.  Although certainly elevated, I would not expect this to cause significant symptoms. --s/p MIVF and pamidronate x1  Malignant neoplasm of overlapping sites of left breast in female, estrogen receptor positive (HCC) Patient was recently admitted, at which time metastatic breast cancer was discovered with metastasis to the liver, bone and abdominal lymph nodes. She has established with Oncology and started on Ribociclib+Letrozole 2 weeks ago.  --pt currently followed by palliative care as outpatient Plan: --cont Femara --hold Ribociclib  - Continue outpatient follow-up with oncology.  Elevated LFTs In the setting of hepatic metastasis.  Relatively stable at this time  Primary hypertension --cont amlodipine and hydralazine  Acute on chronic anemia  --multifactorial no obvious bleeding noted.  --anemia workup showed folate def --s/p 1u pRBC  Plan: --cont folic acid suppl (new)   DVT prophylaxis: Lovenox SQ Code Status: DNR  Family Communication:  Level of care: Med-Surg Dispo:   The patient is from: SNF Anticipated d/c is to: SNF rehab Anticipated d/c date is: whenever bed available   Subjective and Interval History:  No more diarrhea.  Pt had no new complaint.   Objective: Vitals:   01/16/23 0444 01/16/23 0500 01/16/23 0830 01/16/23 1619  BP: 127/71  132/74  121/64  Pulse: 93  94 95  Resp: 20  18 20   Temp: 98.2 F (36.8 C)  98 F (36.7 C) 98.4  F (36.9 C)  TempSrc:   Oral Oral  SpO2: 94%  94% 95%  Weight:  83.7 kg    Height:        Intake/Output Summary (Last 24 hours) at 01/16/2023 1851 Last data filed at 01/16/2023 1700 Gross per 24 hour  Intake 243 ml  Output 1900 ml  Net -1657 ml   Filed Weights   01/13/23 1424 01/15/23 0500 01/16/23 0500  Weight: 82.7 kg 82.6 kg 83.7 kg    Examination:   Constitutional: NAD, AAOx3 HEENT: conjunctivae and lids normal, EOMI CV: No cyanosis.   RESP: normal respiratory effort, on RA Extremities: No effusions, edema in BLE SKIN: warm, dry Neuro: II - XII grossly intact.     Data Reviewed: I have personally reviewed labs and imaging studies  Time spent: 25 minutes  Darlin Priestly, MD [facility] If 7PM-7AM, please contact night-coverage 01/16/2023, 6:51 PM

## 2023-01-17 DIAGNOSIS — R112 Nausea with vomiting, unspecified: Secondary | ICD-10-CM | POA: Diagnosis not present

## 2023-01-17 LAB — BASIC METABOLIC PANEL
Anion gap: 8 (ref 5–15)
BUN: 38 mg/dL — ABNORMAL HIGH (ref 8–23)
CO2: 23 mmol/L (ref 22–32)
Calcium: 9.2 mg/dL (ref 8.9–10.3)
Chloride: 109 mmol/L (ref 98–111)
Creatinine, Ser: 1.41 mg/dL — ABNORMAL HIGH (ref 0.44–1.00)
GFR, Estimated: 41 mL/min — ABNORMAL LOW (ref >60)
Glucose, Bld: 94 mg/dL (ref 70–99)
Potassium: 3.8 mmol/L (ref 3.5–5.1)
Sodium: 140 mmol/L (ref 135–145)

## 2023-01-17 LAB — CULTURE, BLOOD (SINGLE)
Culture: NO GROWTH
Special Requests: ADEQUATE

## 2023-01-17 LAB — CBC
HCT: 29.2 % — ABNORMAL LOW (ref 36.0–46.0)
Hemoglobin: 9.9 g/dL — ABNORMAL LOW (ref 12.0–15.0)
MCH: 27.9 pg (ref 26.0–34.0)
MCHC: 33.9 g/dL (ref 30.0–36.0)
MCV: 82.3 fL (ref 80.0–100.0)
Platelets: 255 10*3/uL (ref 150–400)
RBC: 3.55 MIL/uL — ABNORMAL LOW (ref 3.87–5.11)
RDW: 18.8 % — ABNORMAL HIGH (ref 11.5–15.5)
WBC: 5.9 10*3/uL (ref 4.0–10.5)
nRBC: 0.3 % — ABNORMAL HIGH (ref 0.0–0.2)

## 2023-01-17 LAB — SARS CORONAVIRUS 2 BY RT PCR: SARS Coronavirus 2 by RT PCR: POSITIVE — AB

## 2023-01-17 LAB — PHOSPHORUS: Phosphorus: 2 mg/dL — ABNORMAL LOW (ref 2.5–4.6)

## 2023-01-17 LAB — MAGNESIUM: Magnesium: 2.6 mg/dL — ABNORMAL HIGH (ref 1.7–2.4)

## 2023-01-17 MED ORDER — FOLIC ACID 1 MG PO TABS: 1.0000 mg | ORAL_TABLET | Freq: Every day | ORAL | Status: DC

## 2023-01-17 MED ORDER — K PHOS MONO-SOD PHOS DI & MONO 155-852-130 MG PO TABS: 500.0000 mg | ORAL_TABLET | Freq: Three times a day (TID) | ORAL | Status: DC

## 2023-01-17 MED ORDER — RIBOCICLIB SUCC (600 MG DOSE) 200 MG PO TBPK: ORAL_TABLET | ORAL | Status: DC

## 2023-01-17 MED ORDER — VITAMIN D (ERGOCALCIFEROL) 1.25 MG (50000 UNIT) PO CAPS: 50000.0000 [IU] | ORAL_CAPSULE | ORAL | Status: DC

## 2023-01-17 MED ORDER — ADULT MULTIVITAMIN W/MINERALS CH: 1.0000 | ORAL_TABLET | Freq: Every day | ORAL | Status: DC

## 2023-01-17 MED ORDER — K PHOS MONO-SOD PHOS DI & MONO 155-852-130 MG PO TABS: 500.0000 mg | ORAL_TABLET | Freq: Three times a day (TID) | ORAL | Status: AC

## 2023-01-17 MED ORDER — ENSURE ENLIVE PO LIQD: 237.0000 mL | ORAL | Status: DC

## 2023-01-17 NOTE — TOC Progression Note (Signed)
Transition of Care [facility]) - Progression Note    Patient Details  Name: Stephanie [name] MRN: [id number] Date of Birth: 27-Oct-1955  [facility] of [facility]) CM/SW Contact  Colette Ribas, [place] Phone Number: 01/17/2023, 11:56 AM  Clinical Narrative:    CSW spoke with Kenney Houseman from [facility] to confirm readiness, they  are ready for patient. Berkley Harvey was apprved  [id number] 9/1-9/4 awaiting DC update from MD.        Expected Discharge Plan and Services                                               Social Determinants of Health (SDOH) Interventions SDOH Screenings   Food Insecurity: No Food Insecurity (01/12/2023)  Housing: Low Risk  (01/12/2023)  Transportation Needs: No Transportation Needs (01/12/2023)  Utilities: Not At Risk (01/12/2023)  Tobacco Use: Medium Risk (01/12/2023)    Readmission Risk Interventions     No data to display

## 2023-01-17 NOTE — Plan of Care (Signed)
  Problem: Coping: Goal: Psychosocial and spiritual needs will be supported Outcome: Progressing   Problem: Activity: Goal: Risk for activity intolerance will decrease Outcome: Progressing   Problem: Nutrition: Goal: Adequate nutrition will be maintained Outcome: Progressing   Problem: Coping: Goal: Level of anxiety will decrease Outcome: Progressing  Problem: Pain Managment: Goal: General experience of comfort will improve Outcome: Progressing   Problem: Safety: Goal: Ability to remain free from injury will improve Outcome: Progressing

## 2023-01-17 NOTE — Care Management Important Message (Signed)
Important Message  Patient Details  Name: Stephanie [name] MRN: [id number] Date of Birth: 04/16/1956   Medicare Important Message Given:  Other (see comment)  Attempted to reach patient via room phone [phone]) to review Medicare IM.  Line busy upon attempt.   Johnell Comings 01/17/2023, 3:41 PM

## 2023-01-17 NOTE — Discharge Summary (Signed)
Physician Discharge Summary   QURAN [name]  female DOB: 05-06-1956  ZOX:[id number]  PCP: [facility]  Admit date: 01/12/2023 Discharge date: 01/17/2023  Admitted From: SNF  Disposition:  SNF rehab Family updated at bedside prior to discharge. CODE STATUS: DNR   Hospital Course:  For full details, please see H&P, progress notes, consult notes and ancillary notes.  Briefly,  Stephanie [name] is a 67 y.o. 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   Stephanie [name] states that since starting the new chemotherapy drug approximately 2 weeks ago, her appetite quickly diminished and she began to experience persistent nausea with vomiting that was nonbloody, nonbilious. Since her p.o. intake has decreased and the vomiting started, she developed diffuse generalized weakness.  Stephanie [name] also endorses a cough that is nonproductive and has been occurring for approximately 1.5 weeks.    * Intractable vomiting with nausea, resolved Patient reports 2+ week history of intractable nausea and vomiting since being started on an oral chemotherapy drug for stage IV metastatic breast cancer.   --N/V resolved after presentation. --s/p MIVF   Acute kidney injury (HCC) CKD 3a Over the last month, patient's creatinine has consistently been between 1.2 and 1.5.  No prior labs for the last 2 years, however this does suggest CKD.  Now with elevated creatinine at 1.96, likely due to intractable nausea/vomiting leading to dehydration. --cr improved to 1.41 prior to discharge.   Weakness Patient complaining of generalized weakness, likely exacerbated by the initiation of chemotherapy and subsequent nausea/vomiting.  No focal symptoms to suggest central process. - PT/OT, back to SNF  with rehab   COVID-19 virus infection Patient is reporting a 1.5-week history of a nonproductive cough and 1 day history of shortness of breath.  Chest x-ray is reassuring.  She is outside of the window for any antivirals.  No documented hypoxia.   COPD with acute exacerbation (HCC) Patient reports 1.5-week history of nonproductive cough and 1 day history of shortness of breath.  Notably diminished breath sounds throughout.  Started on prednisone on admission --received 5 days of steroid burst. --received Combivent QID during hospitalization.  Was not taking Combivent PTA.   Hypercalcemia Over the last 3 weeks, patient's calcium levels have been gradually increasing, currently at 10.9 on presentation corrected to 11.6 given low albumin.   --s/p MIVF and pamidronate x1   Malignant neoplasm of overlapping sites of left breast in female, estrogen receptor positive (HCC) Patient was recently admitted, at which time metastatic breast cancer was discovered with metastasis to the liver, bone and abdominal lymph nodes. She has established with Oncology and started on Ribociclib+Letrozole 2 weeks ago.  --pt currently followed by palliative care as outpatient --cont Femara --hold Ribociclib per Onc Dr. Donneta Romberg - Continue outpatient follow-up with oncology.   Elevated LFTs In the setting of hepatic metastasis.  Relatively stable at this time   Primary hypertension --cont amlodipine and hydralazine   Acute on chronic anemia  --multifactorial no obvious bleeding noted.  --anemia workup showed folate def --s/p 1u pRBC  --started on folic acid suppl   Hypokalemia --monitored and supplemented PRN  Hypophos --monitored and supplemented with IV PRN during hospitalization. --discharged on oral K phos for 5 days.   Discharge Diagnoses:  Principal Problem:   Intractable vomiting with nausea Active  Problems:   Acute kidney injury (HCC)   Weakness   COVID-19 virus infection   COPD with  acute exacerbation (HCC)   Hypercalcemia   Malignant neoplasm of overlapping sites of left breast in female, estrogen receptor positive (HCC)   Elevated LFTs   Primary hypertension   Malnutrition of moderate degree   30 Day Unplanned Readmission Risk Score    Flowsheet Row ED to Hosp-Admission (Current) from 01/12/2023 in [facility] 1C MEDICAL TELEMETRY  30 Day Unplanned Readmission Risk Score (%) 27.15 Filed at 01/17/2023 1200       This score is the patient's risk of an unplanned readmission within 30 days of being discharged (0 -100%). The score is based on dignosis, age, lab data, medications, orders, and past utilization.   Low:  0-14.9   Medium: 15-21.9   High: 22-29.9   Extreme: 30 and above         Discharge Instructions:  Allergies as of 01/17/2023       Reactions   Codeine Other (See Comments)   hallucinations   Lisinopril Swelling        Medication List     STOP taking these medications    Ipratropium-Albuterol 20-100 MCG/ACT Aers respimat Commonly known as: COMBIVENT   polyethylene glycol 17 g packet Commonly known as: MIRALAX / GLYCOLAX       TAKE these medications    amLODipine 10 MG tablet Commonly known as: NORVASC TAKE ONE TABLET BY MOUTH ONCE EVERY DAY FOR HIGH BLOOD PRESSURE   bisacodyl 5 MG EC tablet Commonly known as: DULCOLAX Take 2 tablets (10 mg total) by mouth daily as needed for moderate constipation.   busPIRone 5 MG tablet Commonly known as: BUSPAR Take 5 mg by mouth 3 (three) times daily.   feeding supplement Liqd Take 237 mLs by mouth daily. Start taking on: January 18, 2023   folic acid 1 MG tablet Commonly known as: FOLVITE Take 1 tablet (1 mg total) by mouth daily. Start taking on: January 18, 2023   hydrALAZINE 50 MG tablet Commonly known as: APRESOLINE TAKE ONE TABLET BY MOUTH 3 TIMES A DAY   letrozole 2.5 MG tablet Commonly known as: FEMARA Take 1 tablet (2.5 mg total) by mouth  daily.   multivitamin with minerals Tabs tablet Take 1 tablet by mouth daily. Start taking on: January 18, 2023   pantoprazole 40 MG tablet Commonly known as: PROTONIX TAKE ONE TABLET BY MOUTH 2 TIMES A DAY   phosphorus 155-852-130 MG tablet Commonly known as: K PHOS NEUTRAL Take 2 tablets (500 mg total) by mouth 3 (three) times daily before meals for 5 days.   ribociclib succ 200 MG Therapy Pack Commonly known as: KISQALI 600mg  Daily Dose Hold until followup with oncology. What changed:  how much to take how to take this when to take this additional instructions   Vitamin D (Ergocalciferol) 1.25 MG (50000 UNIT) Caps capsule Commonly known as: DRISDOL Take 1 capsule (50,000 Units total) by mouth every 7 (seven) days. Start taking on: January 21, 2023         Follow-up Information     Earna Coder, MD Follow up.   Specialties: Internal Medicine, Oncology Contact information: [address] [place] [address] [phone]                 Allergies  Allergen Reactions   Codeine Other (See Comments)    hallucinations   Lisinopril Swelling     The  results of significant diagnostics from this hospitalization (including imaging, microbiology, ancillary and laboratory) are listed below for reference.   Consultations:   Procedures/Studies: DG Chest Portable 1 View  Result Date: 01/12/2023 CLINICAL DATA:  Shortness of breath. New chemotherapy drug. Nausea and vomiting. EXAM: PORTABLE CHEST 1 VIEW COMPARISON:  Chest radiographs 12/17/2022, 12/14/2022, 06/01/2020; CT chest 12/18/2022 FINDINGS: Cardiac silhouette and mediastinal contours are within normal limits. Mildly decreased lung volumes. Lucencies within the upper lungs with attenuation of the pulmonary vasculature again indicating emphysematous changes. Mild bibasilar linear scarring is similar to prior. No acute airspace opacity. No pleural effusion or pneumothorax. No acute skeletal  abnormality. IMPRESSION: 1. No acute cardiopulmonary process. 2. Chronic emphysematous changes. Electronically Signed   By: Neita Garnet M.D.   On: 01/12/2023 15:44   NM PET Image Initial (PI) Skull Base To Thigh (F-18 FDG)  Result Date: 01/12/2023 CLINICAL DATA:  Initial treatment strategy for metastatic breast cancer. Widespread hepatic abnormalities on MRI suspicious for metastatic disease. Liver biopsy performed 12/20/2022. EXAM: NUCLEAR MEDICINE PET SKULL BASE TO THIGH TECHNIQUE: 10.22 mCi F-18 FDG was injected intravenously. Full-ring PET imaging was performed from the skull base to thigh after the radiotracer. CT data was obtained and used for attenuation correction and anatomic localization. Fasting blood glucose: 83 mg/dl COMPARISON:  Abdominal MRI 12/18/2022, chest CT 12/18/2022 and CTs of the chest, abdomen and pelvis 06/09/2020. FINDINGS: Mediastinal blood pool activity: SUV max 2.2 NECK: Hypermetabolic left supraclavicular nodes measuring up to 9 mm short axis on image 36/4 (SUV max 13.7). No other hypermetabolic cervical lymph nodes. No suspicious activity identified within the pharyngeal mucosal space. Incidental CT findings: none CHEST: In addition to the hypermetabolic left supraclavicular nodes, there are multiple additional hypermetabolic thoracic lymph nodes. The largest left axillary node measures 1.1 cm short axis on image 51/4 and has an SUV max of 14.8. There are several small hypermetabolic left internal mammary lymph nodes (SUV max 7.7) as well as mildly hypermetabolic pretracheal and right hilar lymph nodes. Right cardiophrenic angle node measuring 1.0 cm on image 76/4 has an SUV max of 10.9. The known large mass superiorly in the left breast is also significantly hypermetabolic. This measures 4.6 x 3.1 cm on image 53/4 and has an SUV max of 15.6. There are several additional smaller hypermetabolic nodules laterally in the left breast. No suspicious right breast activity. Small  hypermetabolic nodule anteriorly in the left pleural space measures 3 mm on image 50/6. No hypermetabolic pulmonary activity or suspicious nodularity. Incidental CT findings: Centrilobular and paraseptal emphysema. Mild dependent atelectasis in both lungs. Mild atherosclerosis of the aorta, great vessels and coronary arteries. ABDOMEN/PELVIS: There is extensive hypermetabolic activity throughout the liver consistent with widespread metastatic disease. There is near complete replacement of the normal hepatic parenchyma. Posteriorly in the dome of the right lobe, the metabolic activity has an SUV max of 16.8. There are hypermetabolic lymph nodes in the porta hepatis, largest a portacaval node measuring 1.6 cm on image 97/4 (SUV max 16.9). Nonspecific low level metabolic activity in both adrenal glands (SUV max 5.3 on the left). The adrenal glands appear grossly unchanged from previous CT 06/09/2020. No abnormal metabolic activity the spleen or pancreas. There is no lower abdominal or pelvic adenopathy. Incidental CT findings: Underlying morphologic changes of cirrhosis, similar to previous studies. Pelvic ascites without hypermetabolic activity. Mild aortic atherosclerosis. SKELETON: Single small hypermetabolic lesion in the mid sternal body (SUV max 4.7). No other evidence of osseous metastatic disease. Incidental CT findings:  none IMPRESSION: 1. Large primary malignancy in the left breast is hypermetabolic. Additional hypermetabolic nodules more laterally in the left breast. 2. Widespread hypermetabolic metastatic disease throughout the liver with multiple nodal metastases in the left axilla, left internal mammary chain, left supraclavicular, mediastinum, right hilum and upper abdomen. 3. Single small hypermetabolic osseous metastasis in the mid sternum. 4. Nonspecific low level metabolic activity in both adrenal glands without morphologic change in the adrenal glands compared with previous CTs. 5. Aortic  Atherosclerosis (ICD10-I70.0) and Emphysema (ICD10-J43.9). Electronically Signed   By: Carey Bullocks M.D.   On: 01/12/2023 14:00   IR US LIVER BIOPSY  Result Date: 12/20/2022 INDICATION: Liver masses.  No diagnosis. EXAM: ULTRASOUND GUIDED LIVER MASS BIOPSY COMPARISON:  MRI abdomen 12/18/2022 MEDICATIONS: None ANESTHESIA/SEDATION: Moderate (conscious) sedation was employed during this procedure. A total of Versed 1 mg and Fentanyl 50 mcg was administered intravenously. Moderate Sedation Time: 10 minutes. The patient's level of consciousness and vital signs were monitored continuously by radiology nursing throughout the procedure under my direct supervision. COMPLICATIONS: None immediate. PROCEDURE: Informed written consent was obtained from insufficient wrap after a discussion of the risks, benefits and alternatives to treatment. The patient understands and consents the procedure. A timeout was performed prior to the initiation of the procedure. Ultrasound scanning was performed of the upper abdominal quadrant demonstrates tumor replacement of the LEFT hepatic lobe The LEFT hepatic lobe was selected for biopsy and the procedure was planned. The upper abdominal quadrant was prepped and draped in the usual sterile fashion. The overlying soft tissues were anesthetized with 1% lidocaine. A 17 gauge, 6.8 cm co-axial needle was advanced into a peripheral aspect of the lesion. This was followed by 4 core biopsies with an 18 gauge core device under direct ultrasound guidance. The coaxial needle tract was embolized with a small amount of Gel-Foam slurry and superficial hemostasis was obtained with manual compression. Post procedural scanning was negative for definitive area of hemorrhage or additional complication. A dressing was placed. The patient tolerated the procedure well without immediate post procedural complication. IMPRESSION: Successful ultrasound guided core needle biopsy of liver mass. [name], MD  [facility] Electronically Signed   By: [name] M.D.   On: 12/20/2022 13:26   US Abdomen Limited RUQ (LIVER/GB)  Result Date: 12/20/2022 CLINICAL DATA:  [id number] Liver mass [id number] EXAM: ULTRASOUND ABDOMEN LIMITED RIGHT UPPER QUADRANT COMPARISON:  CT chest, 12/18/2022. MR abdomen, 12/18/2022. US Abdomen, 12/14/2022. FINDINGS: Gallbladder: No gallstones or wall thickening visualized. No sonographic Murphy sign noted by sonographer. Common bile duct: Diameter: 0.5 cm Liver: Nodular contour liver. Heterogeneous hepatic parenchymal echogenicity, with innumerable liver lesions scattered within the RIGHT and LEFT lobes of the liver. Single largest lesion identified within the RIGHT lobe measures up to 2.7 cm. Portal vein is patent on color Doppler imaging with normal direction of blood flow towards the liver. Other: No perihepatic ascites. IMPRESSION: 1. Innumerable lesions, scattered within the liver. 2. No biliary ductal dilatation. Electronically Signed   By: [name] M.D.   On: 12/20/2022 12:10      Labs: BNP (last 3 results) Recent Labs    12/17/22 0325  BNP 91.9   Basic Metabolic Panel: Recent Labs  Lab 01/13/23 0548 01/14/23 0357 01/15/23 0447 01/16/23 0534 01/17/23 0444  NA 135 134* 137 137 140  K 4.4 3.7 3.4* 3.9 3.8  CL 103 101 103 107 109  CO2 23 23 24 22 23   GLUCOSE 113* 143*  104* 93 94  BUN 32* 39* 41* 42* 38*  CREATININE 2.03* 1.92* 1.85* 1.63* 1.41*  CALCIUM 10.0 9.7 9.5 9.2 9.2  MG  --  2.1 2.2 2.5* 2.6*  PHOS  --  2.2* 2.9 2.2* 2.0*   Liver Function Tests: Recent Labs  Lab 01/12/23 1238 01/13/23 0548  AST 85* 67*  ALT 32 24  ALKPHOS 416* 308*  BILITOT 2.8* 1.8*  PROT 8.1 6.6  ALBUMIN 3.1* 2.5*   No results for input(s): "LIPASE", "AMYLASE" in the last 168 hours. No results for input(s): "AMMONIA" in the last 168 hours. CBC: Recent Labs  Lab 01/12/23 1238 01/12/23 1522 01/13/23 0548  01/14/23 0357 01/14/23 2024 01/15/23 0447 01/16/23 0534 01/17/23 0444  WBC 7.1   < > 4.1 6.5  --  6.0 5.8 5.9  NEUTROABS 5.9  --   --   --   --   --   --   --   HGB 9.8*   < > 7.9* 7.9* 9.3* 9.0* 9.4* 9.9*  HCT 29.1*   < > 23.2* 23.4* 27.8* 25.7* 27.0* 29.2*  MCV 82.2   < > 80.6 80.4  --  79.1* 79.4* 82.3  PLT 447*   < > 345 321  --  307 277 255   < > = values in this interval not displayed.   Cardiac Enzymes: No results for input(s): "CKTOTAL", "CKMB", "CKMBINDEX", "TROPONINI" in the last 168 hours. BNP: Invalid input(s): "POCBNP" CBG: No results for input(s): "GLUCAP" in the last 168 hours. D-Dimer No results for input(s): "DDIMER" in the last 72 hours. Hgb A1c No results for input(s): "HGBA1C" in the last 72 hours. Lipid Profile No results for input(s): "CHOL", "HDL", "LDLCALC", "TRIG", "CHOLHDL", "LDLDIRECT" in the last 72 hours. Thyroid function studies No results for input(s): "TSH", "T4TOTAL", "T3FREE", "THYROIDAB" in the last 72 hours.  Invalid input(s): "FREET3" Anemia work up No results for input(s): "VITAMINB12", "FOLATE", "FERRITIN", "TIBC", "IRON", "RETICCTPCT" in the last 72 hours. Urinalysis    Component Value Date/Time   COLORURINE YELLOW (A) 12/14/2022 1553   APPEARANCEUR CLEAR (A) 12/14/2022 1553   LABSPEC 1.009 12/14/2022 1553   PHURINE 5.0 12/14/2022 1553   GLUCOSEU NEGATIVE 12/14/2022 1553   HGBUR NEGATIVE 12/14/2022 1553   BILIRUBINUR NEGATIVE 12/14/2022 1553   KETONESUR NEGATIVE 12/14/2022 1553   PROTEINUR NEGATIVE 12/14/2022 1553   NITRITE NEGATIVE 12/14/2022 1553   LEUKOCYTESUR NEGATIVE 12/14/2022 1553   Sepsis Labs Recent Labs  Lab 01/14/23 0357 01/15/23 0447 01/16/23 0534 01/17/23 0444  WBC 6.5 6.0 5.8 5.9   Microbiology Recent Results (from the past 240 hour(s))  SARS Coronavirus 2 by RT PCR (hospital order, performed in [facility] hospital lab) *cepheid single result test* Anterior Nasal Swab     Status: Abnormal   Collection  Time: 01/12/23  3:22 PM   Specimen: Anterior Nasal Swab  Result Value Ref Range Status   SARS Coronavirus 2 by RT PCR POSITIVE (A) NEGATIVE Final    Comment: (NOTE) SARS-CoV-2 target nucleic acids are DETECTED  SARS-CoV-2 RNA is generally detectable in upper respiratory specimens  during the acute phase of infection.  Positive results are indicative  of the presence of the identified virus, but do not rule out bacterial infection or co-infection with other pathogens not detected by the test.  Clinical correlation with patient history and  other diagnostic information is necessary to determine patient infection status.  The expected result is negative.  Fact Sheet for Patients:   RoadLapTop.co.za  Fact Sheet for Healthcare Providers:   http://kim-miller.com/    This test is not yet approved or cleared by the [place] FDA and  has been authorized for detection and/or diagnosis of SARS-CoV-2 by FDA under an Emergency Use Authorization (EUA).  This EUA will remain in effect (meaning this test can be used) for the duration of  the COVID-19 declaration under Section 564(b)(1)  of the Act, 21 U.S.C. section 360-bbb-3(b)(1), unless the authorization is terminated or revoked sooner.   Performed at [facility], [address] [place], [place] [address]   Blood culture (single)     Status: None   Collection Time: 01/12/23  3:24 PM   Specimen: BLOOD  Result Value Ref Range Status   Specimen Description BLOOD RIGHT ANTECUBITAL  Final   Special Requests   Final    BOTTLES DRAWN AEROBIC AND ANAEROBIC Blood Culture adequate volume   Culture   Final    NO GROWTH 5 DAYS Performed at [facility], [address], [place], [place] [address]    Report Status 01/17/2023 FINAL  Final     Total time spend on discharging this patient, including the last patient exam, discussing the hospital stay, instructions for ongoing  care as it relates to all pertinent caregivers, as well as preparing the medical discharge records, prescriptions, and/or referrals as applicable, is 45 minutes.    Darlin Priestly, MD  [facility] 01/17/2023, 1:40 PM

## 2023-01-18 ENCOUNTER — Encounter: Payer: Self-pay | Admitting: Internal Medicine

## 2023-01-18 ENCOUNTER — Inpatient Hospital Stay: Payer: 59 | Admitting: Internal Medicine

## 2023-01-18 ENCOUNTER — Inpatient Hospital Stay: Payer: 59

## 2023-01-18 ENCOUNTER — Other Ambulatory Visit: Payer: Self-pay

## 2023-02-07 ENCOUNTER — Inpatient Hospital Stay (HOSPITAL_BASED_OUTPATIENT_CLINIC_OR_DEPARTMENT_OTHER): Payer: 59 | Admitting: Nurse Practitioner

## 2023-02-07 ENCOUNTER — Other Ambulatory Visit (HOSPITAL_COMMUNITY): Payer: Self-pay

## 2023-02-07 ENCOUNTER — Inpatient Hospital Stay: Payer: 59 | Attending: Internal Medicine

## 2023-02-07 ENCOUNTER — Inpatient Hospital Stay: Payer: 59

## 2023-02-07 ENCOUNTER — Encounter: Payer: Self-pay | Admitting: Nurse Practitioner

## 2023-02-07 VITALS — BP 114/75 | HR 101 | Temp 98.6°F | Resp 20

## 2023-02-07 DIAGNOSIS — C50812 Malignant neoplasm of overlapping sites of left female breast: Secondary | ICD-10-CM | POA: Diagnosis not present

## 2023-02-07 DIAGNOSIS — C787 Secondary malignant neoplasm of liver and intrahepatic bile duct: Secondary | ICD-10-CM | POA: Insufficient documentation

## 2023-02-07 DIAGNOSIS — Z79811 Long term (current) use of aromatase inhibitors: Secondary | ICD-10-CM | POA: Diagnosis not present

## 2023-02-07 DIAGNOSIS — Z87891 Personal history of nicotine dependence: Secondary | ICD-10-CM | POA: Diagnosis not present

## 2023-02-07 DIAGNOSIS — Z170 Estrogen receptor positive status [ER+]: Secondary | ICD-10-CM | POA: Diagnosis not present

## 2023-02-07 DIAGNOSIS — R7989 Other specified abnormal findings of blood chemistry: Secondary | ICD-10-CM

## 2023-02-07 DIAGNOSIS — Z79899 Other long term (current) drug therapy: Secondary | ICD-10-CM | POA: Insufficient documentation

## 2023-02-07 DIAGNOSIS — Z7189 Other specified counseling: Secondary | ICD-10-CM | POA: Insufficient documentation

## 2023-02-07 LAB — CBC WITH DIFFERENTIAL (CANCER CENTER ONLY)
Abs Immature Granulocytes: 0.16 K/uL — ABNORMAL HIGH (ref 0.00–0.07)
Basophils Absolute: 0.1 K/uL (ref 0.0–0.1)
Basophils Relative: 1 %
Eosinophils Absolute: 0 K/uL (ref 0.0–0.5)
Eosinophils Relative: 0 %
HCT: 30.4 % — ABNORMAL LOW (ref 36.0–46.0)
Hemoglobin: 10.7 g/dL — ABNORMAL LOW (ref 12.0–15.0)
Immature Granulocytes: 1 %
Lymphocytes Relative: 20 %
Lymphs Abs: 2.4 K/uL (ref 0.7–4.0)
MCH: 28 pg (ref 26.0–34.0)
MCHC: 35.2 g/dL (ref 30.0–36.0)
MCV: 79.6 fL — ABNORMAL LOW (ref 80.0–100.0)
Monocytes Absolute: 2.2 K/uL — ABNORMAL HIGH (ref 0.1–1.0)
Monocytes Relative: 19 %
Neutro Abs: 7.1 K/uL (ref 1.7–7.7)
Neutrophils Relative %: 59 %
Platelet Count: 458 K/uL — ABNORMAL HIGH (ref 150–400)
RBC: 3.82 MIL/uL — ABNORMAL LOW (ref 3.87–5.11)
RDW: 21.3 % — ABNORMAL HIGH (ref 11.5–15.5)
WBC Count: 12 K/uL — ABNORMAL HIGH (ref 4.0–10.5)
nRBC: 0.2 % (ref 0.0–0.2)

## 2023-02-07 LAB — CMP (CANCER CENTER ONLY)
ALT: 31 U/L (ref 0–44)
AST: 155 U/L — ABNORMAL HIGH (ref 15–41)
Albumin: 2.7 g/dL — ABNORMAL LOW (ref 3.5–5.0)
Alkaline Phosphatase: 501 U/L — ABNORMAL HIGH (ref 38–126)
Anion gap: 12 (ref 5–15)
BUN: 18 mg/dL (ref 8–23)
CO2: 20 mmol/L — ABNORMAL LOW (ref 22–32)
Calcium: 9.8 mg/dL (ref 8.9–10.3)
Chloride: 100 mmol/L (ref 98–111)
Creatinine: 1.11 mg/dL — ABNORMAL HIGH (ref 0.44–1.00)
GFR, Estimated: 55 mL/min — ABNORMAL LOW
Glucose, Bld: 95 mg/dL (ref 70–99)
Potassium: 3.2 mmol/L — ABNORMAL LOW (ref 3.5–5.1)
Sodium: 132 mmol/L — ABNORMAL LOW (ref 135–145)
Total Bilirubin: 4.9 mg/dL (ref 0.3–1.2)
Total Protein: 7.6 g/dL (ref 6.5–8.1)

## 2023-02-07 MED ORDER — OXYCODONE HCL 5 MG PO TABS
2.5000 mg | ORAL_TABLET | Freq: Four times a day (QID) | ORAL | 0 refills | Status: DC | PRN
Start: 2023-02-07 — End: 2023-04-21

## 2023-02-07 NOTE — Progress Notes (Signed)
[facility] CONSULT NOTE  Patient Care Team: Center, [facility] as PCP - General (General Practice) Lorn Junes, FNP (Family Medicine) [place], Carlye Grippe, RN Earna Coder, MD as Consulting Physician (Oncology) Hulen Luster, RN as Oncology Nurse Navigator  CHIEF COMPLAINTS/PURPOSE OF CONSULTATION: breast cancer  Oncology History Overview Note  # JULY 2024-liver Biopsy [in pt-[facility]]- [facility];   Estrogen Receptor: 90%, POSITIVE, STRONG STAINING INTENSITY Progesterone Receptor: 80%, POSITIVE, STRONG STAINING INTENSITY Proliferation Marker Ki67: 40%; The tumor cells are EQUIVOCAL for Her2 (2+). Her2 by FISH-NEGATIVE  # AUG 13th, 2024-Letrozole + RIbo   # Hx of stroke    Malignant neoplasm of overlapping sites of left breast in female, estrogen receptor positive (HCC)  12/27/2022 Initial Diagnosis   Malignant neoplasm of overlapping sites of left breast in female, estrogen receptor positive (HCC)   12/27/2022 Cancer Staging   Staging form: Breast, AJCC 8th Edition - Clinical: Stage IV (cT2, cN1, cM1, G2, ER+, PR+, HER2-) - Signed by Earna Coder, MD on 12/27/2022 Histologic grading system: 3 grade system    HISTORY OF PRESENTING ILLNESS: Patient ambulating-with assistance/ in wheel chair.  Accompanied by family - cousin and aunt.   Stephanie [name] 67 y.o.  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   She presented to [facility] ED [facility]12[facility] from [facility]F for generalized weakness, intractable vomiting with nausea thought to be related to ribociclib. She reported 1.5 week history of nonproductive cough which was thought to be secondary to COVID-19 infection and COPD exacerbation. She received combivent QID during hospitalization. Additionally, she was found to have hypercalcemia, 10.9, corrected  was 11.6. She received IVF and pamidronate x 1. Ribociclib was held during hospitalization and she continues her femara. She has not restarted ribociclib. She continues to feel generally weak but somewhat improved since discharge. No cough. No chest pain or abdominal pain. Family concerned that her eyes appear more yellow. Oral intake continues to be reduced. Losing weight. She continues to reside at [facility].   Review of Systems  Constitutional:  Positive for malaise/fatigue and weight loss. Negative for chills, diaphoresis and fever.  Respiratory:  Negative for cough, hemoptysis, sputum production, shortness of breath and wheezing.   Cardiovascular:  Negative for chest pain, palpitations, orthopnea and leg swelling.  Gastrointestinal:  Negative for abdominal pain, blood in stool, constipation, diarrhea, heartburn, melena, nausea and vomiting.  Genitourinary:  Negative for dysuria, frequency and urgency.  Musculoskeletal:  Positive for joint pain. Negative for back pain and falls.  Skin: Negative.  Negative for itching and rash.  Neurological:  Positive for focal weakness and weakness. Negative for dizziness, tingling and headaches.  Endo/Heme/Allergies:  Does not bruise/bleed easily.  Psychiatric/Behavioral:  Negative for depression. The patient is not nervous/anxious and does not have insomnia.     MEDICAL HISTORY:  Past Medical History:  Diagnosis Date   Diabetes mellitus without complication (HCC)    Hypertension    Stroke (HCC) 05/17/2020    SURGICAL HISTORY: Past Surgical History:  Procedure Laterality Date   ABDOMINAL HYSTERECTOMY     at 68 years old   CLOSED REDUCTION MANDIBLE Bilateral 05/23/2020   Procedure: CLOSED REDUCTION MANDIBULAR;  Surgeon: Vernie Murders, MD;  Location: [facility] ORS;  Service: ENT;  Laterality: Bilateral;   CLOSED REDUCTION MANDIBLE N/A 06/03/2020   Procedure: CLOSED REDUCTION MANDIBULAR;  Surgeon: Linus Salmons, MD;  Location: [facility] ORS;   Service: ENT;  Laterality: N/A;   COLONOSCOPY WITH PROPOFOL N/A 06/10/2020   Procedure: COLONOSCOPY WITH PROPOFOL;  Surgeon: Pasty Spillers, MD;  Location: [facility] ENDOSCOPY;  Service: Endoscopy;  Laterality: N/A;   IR US LIVER BIOPSY  12/20/2022    SOCIAL HISTORY: Social History   Socioeconomic History   Marital status: Single    Spouse name: Not on file   Number of children: Not on file   Years of education: Not on file   Highest education level: Not on file  Occupational History   Not on file  Tobacco Use   Smoking status: Former    Current packs/day: 0.00    Types: Cigarettes    Quit date: 04/2020    Years since quitting: 2.8   Smokeless tobacco: Never  Vaping Use   Vaping status: Never Used  Substance and Sexual Activity   Alcohol use: Never   Drug use: Never   Sexual activity: Not on file  Other Topics Concern   Not on file  Social History Narrative   Not on file   Social Determinants of Health   Financial Resource Strain: Not on file  Food Insecurity: No Food Insecurity (01/12/2023)   Hunger Vital Sign    Worried About Running Out of Food in the Last Year: Never true    Ran Out of Food in the Last Year: Never true  Transportation Needs: No Transportation Needs (01/12/2023)   PRAPARE - Administrator, Civil Service (Medical): No    Lack of Transportation (Non-Medical): No  Physical Activity: Not on file  Stress: Not on file  Social Connections: Not on file  Intimate Partner Violence: Not At Risk (01/12/2023)   Humiliation, Afraid, Rape, and Kick questionnaire    Fear of Current or Ex-Partner: No    Emotionally Abused: No    Physically Abused: No    Sexually Abused: No    FAMILY HISTORY: Family History  Problem Relation Age of Onset   COPD Mother    Hypertension Mother    Breast cancer Paternal Aunt    Stroke Maternal Grandmother    Hypertension Maternal Grandmother     ALLERGIES:  is allergic to codeine and lisinopril.  MEDICATIONS:   Current Outpatient Medications  Medication Sig Dispense Refill   amLODipine (NORVASC) 10 MG tablet TAKE ONE TABLET BY MOUTH ONCE EVERY DAY FOR HIGH BLOOD PRESSURE 90 tablet 2   bisacodyl (DULCOLAX) 5 MG EC tablet Take 2 tablets (10 mg total) by mouth daily as needed for moderate constipation.     busPIRone (BUSPAR) 5 MG tablet Take 5 mg by mouth 3 (three) times daily.     feeding supplement (ENSURE ENLIVE / ENSURE PLUS) LIQD Take 237 mLs by mouth daily.     folic acid (FOLVITE) 1 MG tablet Take 1 tablet (1 mg total) by mouth daily.     hydrALAZINE (APRESOLINE) 50 MG tablet TAKE ONE TABLET BY MOUTH 3 TIMES A DAY 270 tablet 2   letrozole (FEMARA) 2.5 MG tablet Take 1 tablet (2.5 mg total) by mouth daily. 30 tablet 3   Multiple Vitamin (MULTIVITAMIN WITH MINERALS) TABS tablet Take 1 tablet by mouth daily.     pantoprazole (PROTONIX) 40 MG tablet TAKE ONE TABLET BY MOUTH 2 TIMES A DAY 180 tablet 0   ribociclib succ (KISQALI 600MG  DAILY DOSE) 200 MG Therapy Pack Hold until followup with oncology.     Vitamin D, Ergocalciferol, (  DRISDOL) 1.25 MG (50000 UNIT) CAPS capsule Take 1 capsule (50,000 Units total) by mouth every 7 (seven) days.     No current facility-administered medications for this visit.    PHYSICAL EXAMINATION: Vitals:   02/07/23 1425  BP: 114/75  Pulse: (!) 101  Resp: 20  Temp: 98.6 F (37 C)  SpO2: 100%  There were no vitals filed for this visit.  Physical Exam Vitals reviewed.  Constitutional:      Appearance: She is ill-appearing.     Comments: Wheelchair. Frail appearing. Accompanied by aunt and cousin.   HENT:     Head: Normocephalic and atraumatic.     Mouth/Throat:     Mouth: Mucous membranes are dry.  Eyes:     General: Scleral icterus present.     Extraocular Movements: Extraocular movements intact.     Pupils: Pupils are equal, round, and reactive to light.  Cardiovascular:     Rate and Rhythm: Normal rate and regular rhythm.  Pulmonary:     Effort: No  respiratory distress.     Comments: Decreased breath sounds bilaterally.  Abdominal:     General: There is distension.     Palpations: Abdomen is soft.     Tenderness: There is no abdominal tenderness. There is no guarding.  Musculoskeletal:        General: Normal range of motion.  Skin:    General: Skin is warm.     Coloration: Skin is not jaundiced or pale.  Neurological:     Mental Status: She is alert and oriented to person, place, and time.     Comments: Chronic weakness of the right lower extremity/Hx of stroke.   Psychiatric:        Mood and Affect: Mood normal.        Behavior: Behavior normal.     LABORATORY DATA:  I have reviewed the data as listed Lab Results  Component Value Date   WBC 12.0 (H) 02/07/2023   HGB 10.7 (L) 02/07/2023   HCT 30.4 (L) 02/07/2023   MCV 79.6 (L) 02/07/2023   PLT 458 (H) 02/07/2023   Recent Labs    12/19/22 0427 12/20/22 0421 12/21/22 0402 12/28/22 1610 01/12/23 1238 01/12/23 1522 01/13/23 0548 01/14/23 0357 01/16/23 0534 01/17/23 0444 02/07/23 1353  NA 135 137 136   < > 133*   < > 135   < > 137 140 132*  K 4.1 4.2 4.1   < > 4.0   < > 4.4   < > 3.9 3.8 3.2*  CL 105 104 104   < > 97*   < > 103   < > 107 109 100  CO2 26 23 24    < > 24   < > 23   < > 22 23 20*  GLUCOSE 107* 99 92   < > 113*   < > 113*   < > 93 94 95  BUN 39* 37* 34*   < > 30*   < > 32*   < > 42* 38* 18  CREATININE 1.58* 1.51* 1.46*   < > 1.96*   < > 2.03*   < > 1.63* 1.41* 1.11*  CALCIUM 9.8 10.3 9.9   < > 10.9*   < > 10.0   < > 9.2 9.2 9.8  GFRNONAA 36* 38* 39*   < > 28*   < > 27*   < > 35* 41* 55*  PROT 6.3* 6.5 6.5   < > 8.1  --  6.6  --   --   --  7.6  ALBUMIN 2.7* 2.9* 2.9*   < > 3.1*  --  2.5*  --   --   --  2.7*  AST 88* 119* 305*   < > 85*  --  67*  --   --   --  155*  ALT 40 51* 96*   < > 32  --  24  --   --   --  31  ALKPHOS 226* 251* 286*   < > 416*  --  308*  --   --   --  501*  BILITOT 0.8 0.8 1.4*   < > 2.8*  --  1.8*  --   --   --  4.9*   BILIDIR 0.4* 0.4* 0.4*  --   --   --   --   --   --   --   --   IBILI 0.4 0.4 1.0*  --   --   --   --   --   --   --   --    < > = values in this interval not displayed.    RADIOGRAPHIC STUDIES: I have personally reviewed the radiological images as listed and agreed with the findings in the report. DG Chest Portable 1 View  Result Date: 01/12/2023 CLINICAL DATA:  Shortness of breath. New chemotherapy drug. Nausea and vomiting. EXAM: PORTABLE CHEST 1 VIEW COMPARISON:  Chest radiographs 12/17/2022, 12/14/2022, 06/01/2020; CT chest 12/18/2022 FINDINGS: Cardiac silhouette and mediastinal contours are within normal limits. Mildly decreased lung volumes. Lucencies within the upper lungs with attenuation of the pulmonary vasculature again indicating emphysematous changes. Mild bibasilar linear scarring is similar to prior. No acute airspace opacity. No pleural effusion or pneumothorax. No acute skeletal abnormality. IMPRESSION: 1. No acute cardiopulmonary process. 2. Chronic emphysematous changes. Electronically Signed   By: Neita Garnet M.D.   On: 01/12/2023 15:44    Assessment & Plan:   Metastatic Breast Cancer- Stage IV- Left breast, ER/PR positive, HER2 negative. Currently CDK inhibitor + AI (Ribociclib + Letrozole). Started in August 2024. Poor tolerance. Ribociclib has been held. Poor candidate for other therapies per Dr Donneta Romberg. Will discontinue letrozole and ribociclib to align with her wishes.  Rising LFTs- Bilirubin 4.9, Alk phos 501, AST 155. Jaundiced. Concern for developing obstruction, most likely malignant. Offered hospital for MRI abdomen with possible stenting though if malignant, unlikely candidate for systemic treatments and poor tolerance to ribociclib.  Hypercalcemia- s/p IVF and pamidronate in hospital. Acute on chronic renal failure as well as malignancy.  AKI- creatinine improved with IVF to 1.11.  Nausea & Vomiting- improved. Continue antiemetics as needed History of Stroke-  chronic RLE weakness.  Covid-19 infection- s/p hospitalization. Improving.  Goals of care- After speaking with Dr Donneta Romberg with update of her condition and labs, I reviewed options for hospitalization for workup vs comfort based care with hospice. Patient expresses wishes for hospice. We completed DNR and MOST forms today to reflect her wishes. Will get scanned. Provided patient with originals. I communicated with [facility] [place] [place] [facility] [place], [place]) where she resides that she wishes to stay at facility. Emailed referral and called Parthenia Ames, NP.   Disposition-  Follow up as needed  No problem-specific Assessment & Plan notes found for this encounter.  Above plan of care was discussed with patient/family in detail.  My contact information was given to the patient/family.   Thank you for allowing me  to participate in the care of this very pleasant patient.    Alinda Dooms, NP 02/07/2023

## 2023-02-08 ENCOUNTER — Other Ambulatory Visit: Payer: Self-pay

## 2023-02-08 ENCOUNTER — Encounter: Payer: Self-pay | Admitting: *Deleted

## 2023-02-08 ENCOUNTER — Telehealth: Payer: Self-pay | Admitting: *Deleted

## 2023-02-08 NOTE — Telephone Encounter (Signed)
In attempt to colaborate hospice care for this patient, i called [facility] twice this morning at 915 and again at 945. I was informed that Jolene Schimke were both in a mtg. When I called back the second time, Azucena Kuba had already left and will not be back in the office until after 4pm today. I was transferred to Tawanda's vm at  [phone]. Left detailed vm that I was calling to coordinate an urgent hospice referral.

## 2023-02-08 NOTE — Progress Notes (Signed)
Per Office Visit chart notes from 02/07/23, Pt wishes to discontinue Kisqali. Patient has been disenrolled from Specialty Pharmacy services at this time.

## 2023-02-14 ENCOUNTER — Encounter: Payer: Self-pay | Admitting: Internal Medicine

## 2023-02-14 ENCOUNTER — Other Ambulatory Visit: Payer: Self-pay

## 2023-02-15 DEATH — deceased
